# Patient Record
Sex: Male | Born: 1940 | Race: White | Hispanic: No | Marital: Married | Smoking: Former smoker
Health system: Southern US, Community
[De-identification: ages and names within clinical notes are randomized; demographics above are authoritative.]

## PROBLEM LIST (undated history)

## (undated) DIAGNOSIS — Z86718 Personal history of other venous thrombosis and embolism: Secondary | ICD-10-CM

## (undated) DIAGNOSIS — D6859 Other primary thrombophilia: Secondary | ICD-10-CM

## (undated) DIAGNOSIS — R351 Nocturia: Secondary | ICD-10-CM

## (undated) DIAGNOSIS — K5909 Other constipation: Secondary | ICD-10-CM

## (undated) DIAGNOSIS — Z86711 Personal history of pulmonary embolism: Secondary | ICD-10-CM

## (undated) DIAGNOSIS — Z8673 Personal history of transient ischemic attack (TIA), and cerebral infarction without residual deficits: Secondary | ICD-10-CM

## (undated) DIAGNOSIS — M1990 Unspecified osteoarthritis, unspecified site: Secondary | ICD-10-CM

## (undated) DIAGNOSIS — G629 Polyneuropathy, unspecified: Secondary | ICD-10-CM

## (undated) DIAGNOSIS — N401 Enlarged prostate with lower urinary tract symptoms: Secondary | ICD-10-CM

## (undated) DIAGNOSIS — I4510 Unspecified right bundle-branch block: Secondary | ICD-10-CM

## (undated) DIAGNOSIS — H9190 Unspecified hearing loss, unspecified ear: Secondary | ICD-10-CM

## (undated) DIAGNOSIS — Z973 Presence of spectacles and contact lenses: Secondary | ICD-10-CM

## (undated) DIAGNOSIS — S149XXA Injury of unspecified nerves of neck, initial encounter: Secondary | ICD-10-CM

## (undated) DIAGNOSIS — L219 Seborrheic dermatitis, unspecified: Secondary | ICD-10-CM

## (undated) DIAGNOSIS — Z8659 Personal history of other mental and behavioral disorders: Secondary | ICD-10-CM

## (undated) DIAGNOSIS — I10 Essential (primary) hypertension: Secondary | ICD-10-CM

## (undated) DIAGNOSIS — J449 Chronic obstructive pulmonary disease, unspecified: Secondary | ICD-10-CM

## (undated) DIAGNOSIS — E785 Hyperlipidemia, unspecified: Secondary | ICD-10-CM

## (undated) DIAGNOSIS — IMO0001 Reserved for inherently not codable concepts without codable children: Secondary | ICD-10-CM

## (undated) DIAGNOSIS — L309 Dermatitis, unspecified: Secondary | ICD-10-CM

## (undated) DIAGNOSIS — E119 Type 2 diabetes mellitus without complications: Secondary | ICD-10-CM

## (undated) DIAGNOSIS — Z85828 Personal history of other malignant neoplasm of skin: Secondary | ICD-10-CM

## (undated) DIAGNOSIS — I639 Cerebral infarction, unspecified: Secondary | ICD-10-CM

## (undated) DIAGNOSIS — G589 Mononeuropathy, unspecified: Secondary | ICD-10-CM

## (undated) DIAGNOSIS — R011 Cardiac murmur, unspecified: Secondary | ICD-10-CM

## (undated) DIAGNOSIS — Z86010 Personal history of colonic polyps: Secondary | ICD-10-CM

## (undated) DIAGNOSIS — C61 Malignant neoplasm of prostate: Secondary | ICD-10-CM

## (undated) DIAGNOSIS — Z860101 Personal history of adenomatous and serrated colon polyps: Secondary | ICD-10-CM

## (undated) DIAGNOSIS — Z7901 Long term (current) use of anticoagulants: Secondary | ICD-10-CM

## (undated) DIAGNOSIS — F32A Depression, unspecified: Secondary | ICD-10-CM

## (undated) DIAGNOSIS — R350 Frequency of micturition: Secondary | ICD-10-CM

## (undated) HISTORY — DX: Frequency of micturition: R35.0

## (undated) HISTORY — DX: Other primary thrombophilia: D68.59

## (undated) HISTORY — DX: Personal history of other venous thrombosis and embolism: Z86.718

## (undated) HISTORY — DX: Reserved for inherently not codable concepts without codable children: IMO0001

## (undated) HISTORY — PX: PROSTATE BIOPSY: SHX241

## (undated) HISTORY — PX: TOTAL HIP ARTHROPLASTY: SHX124

## (undated) HISTORY — DX: Cerebral infarction, unspecified: I63.9

## (undated) HISTORY — DX: Unspecified right bundle-branch block: I45.10

## (undated) HISTORY — PX: KNEE ARTHROSCOPY: SUR90

## (undated) HISTORY — DX: Seborrheic dermatitis, unspecified: L21.9

## (undated) HISTORY — DX: Long term (current) use of anticoagulants: Z79.01

## (undated) HISTORY — DX: Essential (primary) hypertension: I10

## (undated) HISTORY — PX: COLONOSCOPY: SHX174

## (undated) HISTORY — DX: Dermatitis, unspecified: L30.9

## (undated) HISTORY — PX: TONSILLECTOMY: SUR1361

---

## 1966-09-22 HISTORY — PX: PILONIDAL CYST EXCISION: SHX744

## 1979-09-23 HISTORY — PX: CHOLECYSTECTOMY OPEN: SUR202

## 2009-03-12 ENCOUNTER — Encounter: Admission: RE | Admit: 2009-03-12 | Discharge: 2009-03-12 | Payer: Self-pay | Admitting: *Deleted

## 2009-05-03 ENCOUNTER — Ambulatory Visit (HOSPITAL_COMMUNITY): Admission: RE | Admit: 2009-05-03 | Discharge: 2009-05-03 | Payer: Self-pay | Admitting: Internal Medicine

## 2009-05-04 ENCOUNTER — Encounter: Admission: RE | Admit: 2009-05-04 | Discharge: 2009-05-04 | Payer: Self-pay | Admitting: Internal Medicine

## 2009-06-18 ENCOUNTER — Inpatient Hospital Stay (HOSPITAL_COMMUNITY): Admission: RE | Admit: 2009-06-18 | Discharge: 2009-06-22 | Payer: Self-pay | Admitting: Orthopedic Surgery

## 2010-11-18 ENCOUNTER — Encounter: Payer: MEDICARE | Attending: Internal Medicine | Admitting: *Deleted

## 2010-11-18 DIAGNOSIS — Z713 Dietary counseling and surveillance: Secondary | ICD-10-CM | POA: Insufficient documentation

## 2010-11-18 DIAGNOSIS — Z724 Inappropriate diet and eating habits: Secondary | ICD-10-CM | POA: Insufficient documentation

## 2010-12-25 ENCOUNTER — Encounter: Payer: Medicare Other | Attending: Internal Medicine | Admitting: *Deleted

## 2010-12-25 DIAGNOSIS — E119 Type 2 diabetes mellitus without complications: Secondary | ICD-10-CM | POA: Insufficient documentation

## 2010-12-25 DIAGNOSIS — Z724 Inappropriate diet and eating habits: Secondary | ICD-10-CM | POA: Insufficient documentation

## 2010-12-25 DIAGNOSIS — Z713 Dietary counseling and surveillance: Secondary | ICD-10-CM | POA: Insufficient documentation

## 2010-12-26 LAB — PROTIME-INR: INR: 1.8 — ABNORMAL HIGH (ref 0.00–1.49)

## 2010-12-27 LAB — CBC
HCT: 31.5 % — ABNORMAL LOW (ref 39.0–52.0)
HCT: 43.8 % (ref 39.0–52.0)
MCHC: 34.5 g/dL (ref 30.0–36.0)
Platelets: 172 10*3/uL (ref 150–400)
Platelets: 188 10*3/uL (ref 150–400)
Platelets: 222 10*3/uL (ref 150–400)
RBC: 3.27 MIL/uL — ABNORMAL LOW (ref 4.22–5.81)
RBC: 4.88 MIL/uL (ref 4.22–5.81)
RDW: 13.7 % (ref 11.5–15.5)
RDW: 14.1 % (ref 11.5–15.5)
RDW: 14.2 % (ref 11.5–15.5)
WBC: 10.7 10*3/uL — ABNORMAL HIGH (ref 4.0–10.5)

## 2010-12-27 LAB — TYPE AND SCREEN: Antibody Screen: NEGATIVE

## 2010-12-27 LAB — PROTIME-INR
INR: 1 (ref 0.00–1.49)
INR: 1.3 (ref 0.00–1.49)
Prothrombin Time: 13.4 seconds (ref 11.6–15.2)
Prothrombin Time: 16.3 seconds — ABNORMAL HIGH (ref 11.6–15.2)

## 2010-12-27 LAB — APTT: aPTT: 27 seconds (ref 24–37)

## 2010-12-27 LAB — COMPREHENSIVE METABOLIC PANEL
AST: 23 U/L (ref 0–37)
Albumin: 3.9 g/dL (ref 3.5–5.2)
Alkaline Phosphatase: 62 U/L (ref 39–117)
CO2: 29 mEq/L (ref 19–32)
Chloride: 100 mEq/L (ref 96–112)
GFR calc Af Amer: >60 mL/min (ref >60)
Potassium: 4.2 mEq/L (ref 3.5–5.1)
Total Bilirubin: 0.7 mg/dL (ref 0.3–1.2)

## 2010-12-27 LAB — BASIC METABOLIC PANEL
BUN: 10 mg/dL (ref 6–23)
CO2: 29 mEq/L (ref 19–32)
Calcium: 8.1 mg/dL — ABNORMAL LOW (ref 8.4–10.5)
Creatinine, Ser: 0.86 mg/dL (ref 0.4–1.5)
GFR calc Af Amer: >60 mL/min (ref >60)
GFR calc non Af Amer: >60 mL/min (ref >60)
Glucose, Bld: 133 mg/dL — ABNORMAL HIGH (ref 70–99)
Glucose, Bld: 171 mg/dL — ABNORMAL HIGH (ref 70–99)
Potassium: 4.3 mEq/L (ref 3.5–5.1)

## 2010-12-27 LAB — URINALYSIS, ROUTINE W REFLEX MICROSCOPIC
Bilirubin Urine: NEGATIVE
Glucose, UA: NEGATIVE mg/dL
Hgb urine dipstick: NEGATIVE
Nitrite: NEGATIVE
Specific Gravity, Urine: 1.014 (ref 1.005–1.030)
pH: 7 (ref 5.0–8.0)

## 2010-12-29 LAB — BLOOD GAS, ARTERIAL
Bicarbonate: 24 mEq/L (ref 20.0–24.0)
FIO2: 0.21 %
Patient temperature: 98.6
pCO2 arterial: 36.9 mmHg (ref 35.0–45.0)
pH, Arterial: 7.429 (ref 7.350–7.450)

## 2011-04-22 ENCOUNTER — Encounter: Payer: Medicare Other | Attending: Internal Medicine | Admitting: *Deleted

## 2011-04-22 DIAGNOSIS — E119 Type 2 diabetes mellitus without complications: Secondary | ICD-10-CM | POA: Insufficient documentation

## 2011-04-22 DIAGNOSIS — Z713 Dietary counseling and surveillance: Secondary | ICD-10-CM | POA: Insufficient documentation

## 2011-04-22 DIAGNOSIS — Z724 Inappropriate diet and eating habits: Secondary | ICD-10-CM | POA: Insufficient documentation

## 2011-04-25 ENCOUNTER — Encounter: Payer: Self-pay | Admitting: *Deleted

## 2011-04-25 NOTE — Patient Instructions (Addendum)
Goals:  Follow Diabetes Meal Plan as instructed (yellow card).  Eat 3 meals and 2-3 snacks daily - Avoid meal skipping.  Limit carbohydrate intake to 45 grams per meal and 15 grams per snack.  Add lean protein foods to all meals and snacks.  Monitor glucose levels as instructed by your doctor.  Aim for 30 mins of physical activity daily.  Bring glucose log to your next nutrition visit.

## 2011-04-25 NOTE — Progress Notes (Signed)
  Medical Nutrition Therapy:  Appt start time: 1200 end time:  1300.  Assessment:  Primary concerns today: T2DM Follow up visit.  Pt down total of 27 lbs in 5 months (5-6 lbs/mo) and reports doing well.  Checks BG 1x/day at varying times. Recent:  FBG - 114, pre-prandial - 125, @hs  - 118 (mg/dL).  A1c has decreased from 7.3% (11/18/2010) to 6.7% (03/05/11).   Current outpatient prescriptions:  Zquil (OTC), Take 5 mLs by mouth 3 (three) times a week.   Cholecalciferol (VITAMIN D) 2000 UNITS CAPS, Take 1 capsule by mouth daily.  felodipine (PLENDIL) 10 MG 24 hr tablet, Take 10 mg by mouth daily.  lisinopril (PRINIVIL,ZESTRIL) 20 MG tablet, Take 20 mg by mouth daily.  metFORMIN (GLUCOPHAGE) 500 MG tablet, Take 500 mg by mouth 2 (two) times daily with a meal.  simvastatin (ZOCOR) 20 MG tablet, Take 20 mg by mouth at bedtime.  traMADol (ULTRAM) 50 MG tablet, Take 50 mg by mouth every 6 (six) hours as needed.  triamterene-hydrochlorothiazide (DYAZIDE) 37.5-25 MG per capsule, Take 1 capsule by mouth every morning. warfarin (COUMADIN) 10 MG tablet, Take 10 mg by mouth daily.  warfarin (COUMADIN) 5 MG tablet, Take 5 mg by mouth daily.   DIETARY INTAKE:  Reports 0 fried foods, baking all meat.  Decreased bread intake, eats whole wheat.    Recent physical activity: No exercise noted at this time.  Estimated energy needs: 1600-1700 calories 180-190 g carbohydrates  Progress Towards Goal(s):  Some progress.   Nutritional Diagnosis:  [place]-2.1 Inpaired nutrition utilization related to glucose and excessive CHO intake as evidenced by 24 hour recall and elevated A1c.    Intervention/Goals:  Follow Diabetes Meal Plan as instructed (yellow card).  Eat 3 meals and 2-3 snacks daily - Avoid meal skipping.  Limit carbohydrate intake to 45 grams per meal and 15 grams per snack.  Add lean protein foods to all meals and snacks.  Monitor glucose levels as instructed by your doctor.  Aim for 30 mins of  physical activity daily.  Bring glucose log to your next nutrition visit.  Monitoring/Evaluation:  Dietary intake, exercise, A1c, blood glucose, and body weight prn.

## 2012-12-10 ENCOUNTER — Other Ambulatory Visit: Payer: Self-pay | Admitting: Geriatric Medicine

## 2012-12-10 DIAGNOSIS — R413 Other amnesia: Secondary | ICD-10-CM

## 2012-12-10 DIAGNOSIS — I10 Essential (primary) hypertension: Secondary | ICD-10-CM

## 2012-12-10 DIAGNOSIS — E785 Hyperlipidemia, unspecified: Secondary | ICD-10-CM

## 2012-12-10 DIAGNOSIS — E119 Type 2 diabetes mellitus without complications: Secondary | ICD-10-CM

## 2012-12-10 DIAGNOSIS — J449 Chronic obstructive pulmonary disease, unspecified: Secondary | ICD-10-CM

## 2012-12-28 ENCOUNTER — Other Ambulatory Visit: Payer: Self-pay | Admitting: *Deleted

## 2012-12-31 ENCOUNTER — Encounter: Payer: Self-pay | Admitting: Geriatric Medicine

## 2013-01-03 ENCOUNTER — Encounter: Payer: Self-pay | Admitting: Pharmacotherapy

## 2013-01-03 ENCOUNTER — Ambulatory Visit (INDEPENDENT_AMBULATORY_CARE_PROVIDER_SITE_OTHER): Payer: Medicare Other | Admitting: Pharmacotherapy

## 2013-01-03 VITALS — BP 118/64 | HR 60 | Ht 66.0 in | Wt 234.0 lb

## 2013-01-03 DIAGNOSIS — D6859 Other primary thrombophilia: Secondary | ICD-10-CM

## 2013-01-03 DIAGNOSIS — Z7901 Long term (current) use of anticoagulants: Secondary | ICD-10-CM

## 2013-01-03 DIAGNOSIS — I2699 Other pulmonary embolism without acute cor pulmonale: Secondary | ICD-10-CM

## 2013-01-03 LAB — POCT INR: INR: 2

## 2013-01-03 NOTE — Progress Notes (Signed)
Subjective:     Patient ID: Barbaraann Barthel, male   DOB: 04-03-1941, 72 y.o.   MRN: [id number]  HPI Last INR was OK at 1.9.   Goal INR is 2-3 Current Coumadin dose is 15mg  QD except 12.5mg  on Sundays Denies missed doses. Has several scratches on arms from run in with [place]. Denies CP, SOB, falls Consistent with vitamin K foods.  Average BG:  107mg /dl No hypoglycemia   Review of Systems  HENT: Negative for nosebleeds.   Respiratory: Negative for chest tightness and shortness of breath.   Cardiovascular: Negative for chest pain and palpitations.  Gastrointestinal: Negative for blood in stool and anal bleeding.  Genitourinary: Negative for hematuria.       Objective:   Physical Exam  Constitutional: He is oriented to person, place, and time. He appears well-developed and well-nourished.  Eyes: Conjunctivae are normal. Pupils are equal, round, and reactive to light.  Neck: Normal range of motion.  Cardiovascular: Normal rate, regular rhythm and normal heart sounds.   Pulmonary/Chest: Effort normal and breath sounds normal.  Musculoskeletal: Normal range of motion.  Neurological: He is alert and oriented to person, place, and time.  Skin:  Scratches on arms       Assessment:     INR 2.0     Plan:     1.  Continue Coumadin 15mg  QD except 12.5mg  on Sundays .  INR at goal 2-3. 2.  Hypercoaguable state - stable 3.  PE - stable

## 2013-01-28 ENCOUNTER — Encounter: Payer: Self-pay | Admitting: *Deleted

## 2013-01-31 ENCOUNTER — Ambulatory Visit (INDEPENDENT_AMBULATORY_CARE_PROVIDER_SITE_OTHER): Payer: Medicare Other | Admitting: Pharmacotherapy

## 2013-01-31 ENCOUNTER — Encounter: Payer: Self-pay | Admitting: Pharmacotherapy

## 2013-01-31 ENCOUNTER — Other Ambulatory Visit: Payer: Self-pay | Admitting: *Deleted

## 2013-01-31 VITALS — BP 118/60 | HR 62 | Temp 96.0°F | Resp 20 | Ht 67.0 in | Wt 239.2 lb

## 2013-01-31 DIAGNOSIS — D6859 Other primary thrombophilia: Secondary | ICD-10-CM

## 2013-01-31 DIAGNOSIS — Z7901 Long term (current) use of anticoagulants: Secondary | ICD-10-CM

## 2013-01-31 DIAGNOSIS — I2699 Other pulmonary embolism without acute cor pulmonale: Secondary | ICD-10-CM

## 2013-01-31 MED ORDER — LISINOPRIL 20 MG PO TABS: ORAL_TABLET | ORAL | Status: DC

## 2013-01-31 MED ORDER — SIMVASTATIN 20 MG PO TABS: ORAL_TABLET | ORAL | Status: DC

## 2013-01-31 NOTE — Progress Notes (Signed)
Subjective:     Patient ID: Colton Miranda, male   DOB: Sep 01, 1941, 72 y.o.   MRN: [id number]  HPI Last INR was a little low, but we kept Coumadin at 15mg  daily except 12.5mg  on Sundays. Denies missed doses. No unusual bleeding or bruising. No CP, SOB, falls Consistent with vitamin K  Average BG:  111mg /dl No hypoglycemia  Review of Systems  HENT: Negative for nosebleeds.   Respiratory: Negative for chest tightness and shortness of breath.   Cardiovascular: Negative for chest pain.  Gastrointestinal: Negative for blood in stool and anal bleeding.  Genitourinary: Negative for hematuria.       Objective:   Physical Exam  Constitutional: He is oriented to person, place, and time. He appears well-developed and well-nourished.  Eyes: Conjunctivae are normal. Pupils are equal, round, and reactive to light.  Neck: Normal range of motion.  Cardiovascular: Normal rate and regular rhythm.   Pulmonary/Chest: Effort normal and breath sounds normal.  Neurological: He is alert and oriented to person, place, and time.  Skin: Skin is warm and dry.  Psychiatric: He has a normal mood and affect. His behavior is normal.    BP:  118/60    HR  62    Wt:  239lb   Temp:  96     Assessment:     INR 2.0     Plan:     1.  hypercoaguable state - no evidence of clotting 2.  INR at goal 2-3 3.  Continue Coumadin 15mg  QD except 12.5mg  Sundays

## 2013-01-31 NOTE — Patient Instructions (Addendum)
Continue Coumadin 15mg  daily except 12.5mg  on Sundays

## 2013-02-21 ENCOUNTER — Other Ambulatory Visit: Payer: Medicare Other

## 2013-02-21 DIAGNOSIS — E119 Type 2 diabetes mellitus without complications: Secondary | ICD-10-CM

## 2013-02-21 DIAGNOSIS — I10 Essential (primary) hypertension: Secondary | ICD-10-CM

## 2013-02-21 DIAGNOSIS — E785 Hyperlipidemia, unspecified: Secondary | ICD-10-CM

## 2013-02-22 LAB — BASIC METABOLIC PANEL
BUN/Creatinine Ratio: 18 (ref 10–22)
Creatinine, Ser: 0.98 mg/dL (ref 0.76–1.27)
GFR calc non Af Amer: 77 mL/min/{1.73_m2} (ref >59)
Sodium: 138 mmol/L (ref 134–144)

## 2013-02-22 LAB — LIPID PANEL
Chol/HDL Ratio: 2.3 ratio units (ref 0.0–5.0)
HDL: 47 mg/dL (ref >39)
LDL Calculated: 45 mg/dL (ref 0–99)
VLDL Cholesterol Cal: 14 mg/dL (ref 5–40)

## 2013-02-22 LAB — HEMOGLOBIN A1C: Hgb A1c MFr Bld: 6.4 % — ABNORMAL HIGH (ref 4.8–5.6)

## 2013-02-23 ENCOUNTER — Encounter: Payer: Self-pay | Admitting: Internal Medicine

## 2013-02-23 ENCOUNTER — Encounter: Payer: Self-pay | Admitting: Geriatric Medicine

## 2013-02-23 ENCOUNTER — Ambulatory Visit (INDEPENDENT_AMBULATORY_CARE_PROVIDER_SITE_OTHER): Payer: Medicare Other | Admitting: Internal Medicine

## 2013-02-23 VITALS — BP 116/70 | HR 58 | Temp 97.9°F | Resp 22 | Ht 67.0 in | Wt 235.0 lb

## 2013-02-23 DIAGNOSIS — E669 Obesity, unspecified: Secondary | ICD-10-CM

## 2013-02-23 DIAGNOSIS — G4700 Insomnia, unspecified: Secondary | ICD-10-CM

## 2013-02-23 DIAGNOSIS — D6859 Other primary thrombophilia: Secondary | ICD-10-CM

## 2013-02-23 DIAGNOSIS — E785 Hyperlipidemia, unspecified: Secondary | ICD-10-CM | POA: Insufficient documentation

## 2013-02-23 DIAGNOSIS — E1351 Other specified diabetes mellitus with diabetic peripheral angiopathy without gangrene: Secondary | ICD-10-CM | POA: Insufficient documentation

## 2013-02-23 DIAGNOSIS — E119 Type 2 diabetes mellitus without complications: Secondary | ICD-10-CM | POA: Insufficient documentation

## 2013-02-23 DIAGNOSIS — I2699 Other pulmonary embolism without acute cor pulmonale: Secondary | ICD-10-CM

## 2013-02-23 DIAGNOSIS — R350 Frequency of micturition: Secondary | ICD-10-CM

## 2013-02-23 DIAGNOSIS — H6123 Impacted cerumen, bilateral: Secondary | ICD-10-CM

## 2013-02-23 DIAGNOSIS — R209 Unspecified disturbances of skin sensation: Secondary | ICD-10-CM | POA: Insufficient documentation

## 2013-02-23 DIAGNOSIS — Z7901 Long term (current) use of anticoagulants: Secondary | ICD-10-CM

## 2013-02-23 DIAGNOSIS — H6120 Impacted cerumen, unspecified ear: Secondary | ICD-10-CM | POA: Insufficient documentation

## 2013-02-23 NOTE — Patient Instructions (Addendum)
Continue current medications. 

## 2013-02-23 NOTE — Progress Notes (Signed)
  Subjective:    Patient ID: Colton Miranda, male    DOB: Apr 15, 1941, 72 y.o.   MRN: [id number]  HPI Insomnia. Using OTC meds sometimes. Had sleep study by Dr. Jacinto Halim. Noted to have mild OSA. Did not need CPAP.  Tinnitus loud in the last few days.  Using a brace on the left knee.   Review of Systems  Constitutional: Negative for chills, activity change and appetite change.  HENT: Positive for hearing loss and tinnitus. Negative for ear pain and nosebleeds.   Eyes:       Corrective lenses  Respiratory: Negative for chest tightness and shortness of breath.   Cardiovascular: Positive for leg swelling. Negative for chest pain and palpitations.  Gastrointestinal: Negative for blood in stool and anal bleeding.  Endocrine:       Diabetic  Genitourinary: Negative for hematuria.  Musculoskeletal: Positive for back pain and arthralgias.  Skin: Negative.   Neurological: Negative for dizziness, tremors, seizures, syncope, facial asymmetry, speech difficulty, weakness, light-headedness, numbness and headaches.       Previous problems with paresthesias and discomfort in the left arm have not recurred in the last few months.  Hematological: Negative.        History of hypercoagulable state.  Psychiatric/Behavioral:       Problems with insomnia. Wakes at night. He is up at 4 AM.       Objective:   Physical Exam  Constitutional: He is oriented to person, place, and time.  Obese  HENT:  Bilateral cerumen impactions  Eyes:   Corrective lenses  Neck: Normal range of motion. Neck supple. No JVD present. No tracheal deviation present. No thyromegaly present.  Cardiovascular: Normal rate, regular rhythm and normal heart sounds.  Exam reveals no gallop and no friction rub.   No murmur heard. Pulmonary/Chest: Effort normal and breath sounds normal. No respiratory distress. He has no wheezes. He has no rales.  Abdominal: Soft. Bowel sounds are normal. He exhibits no distension and no mass. There  is no tenderness.  Musculoskeletal:  Back discomfort to palpation. 3+ bipedal edema. No calf tenderness. Full range of motion at shoulders, elbows, hips, and knees. Wobbly gait.  Lymphadenopathy:    He has no cervical adenopathy.  Neurological: He is alert and oriented to person, place, and time. No cranial nerve deficit.  Skin: Skin is warm and dry. No rash noted. No erythema. No pallor.  Psychiatric: He has a normal mood and affect. His behavior is normal. Thought content normal.          Assessment & Plan:  1. Obesity, unspecified Patient has not lost any weight. We encouraged him to continue to diet and try to lose more weight.  2. Type II or unspecified type diabetes mellitus without mention of complication, uncontrolled Excellent control. - Comprehensive metabolic panel; Future - Hemoglobin A1c; Future  3. Other and unspecified hyperlipidemia Controlled  4. Urinary frequency Unchanged  5. Impacted cerumen, bilateral Lavaged in the office today. Patient expressed an inability to hear clearer.  6. Primary hypercoagulable state Patient will be on lifelong anticoagulation related to this problem. Previous pulmonary embolus.  7. 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 No recent problems.  8. Long term (current) use of anticoagulants Lifelong anticoagulation due to hypercoagulable state and prior pulmonary embolus.  9. Disturbance of skin sensation Resolved  10. Insomnia, unspecified Patient says he does not want to take any additional medication at this time.

## 2013-03-03 ENCOUNTER — Other Ambulatory Visit: Payer: Self-pay | Admitting: *Deleted

## 2013-03-03 MED ORDER — TRAMADOL HCL 50 MG PO TABS: ORAL_TABLET | ORAL | Status: DC

## 2013-03-04 ENCOUNTER — Encounter: Payer: Self-pay | Admitting: *Deleted

## 2013-03-07 ENCOUNTER — Ambulatory Visit (INDEPENDENT_AMBULATORY_CARE_PROVIDER_SITE_OTHER): Payer: Medicare Other | Admitting: Pharmacotherapy

## 2013-03-07 ENCOUNTER — Encounter: Payer: Self-pay | Admitting: Pharmacotherapy

## 2013-03-07 VITALS — BP 148/86 | HR 52 | Resp 13 | Ht 67.0 in | Wt 234.0 lb

## 2013-03-07 DIAGNOSIS — D6859 Other primary thrombophilia: Secondary | ICD-10-CM

## 2013-03-07 DIAGNOSIS — Z7901 Long term (current) use of anticoagulants: Secondary | ICD-10-CM

## 2013-03-07 DIAGNOSIS — I2699 Other pulmonary embolism without acute cor pulmonale: Secondary | ICD-10-CM

## 2013-03-07 LAB — POCT INR: INR: 1.8

## 2013-03-07 NOTE — Patient Instructions (Signed)
Increase Coumadin 15mg  daily

## 2013-03-07 NOTE — Progress Notes (Signed)
Subjective:     Patient ID: Colton Miranda, male   DOB: [birth year], 72 y.o.   MRN: [id number]  HPI Last INR was OK at 2.0 Current coumadin dose is 15mg  daily except 12.5mg  Sundays Denies missed doses. Denies unusual bleeding or bruising. Denies CP, falls May be having more vitamin K foods - eating salads daily now.  Average BG:  105mg /dl No hypoglycemia.   Review of Systems  HENT: Negative for nosebleeds.   Respiratory: Negative for shortness of breath.   Cardiovascular: Negative for chest pain.  Gastrointestinal: Negative for blood in stool and anal bleeding.  Genitourinary: Negative for hematuria.       Objective:   Physical Exam  Constitutional: He is oriented to person, place, and time. He appears well-developed and well-nourished.  HENT:  Head: Normocephalic.  Eyes: Conjunctivae are normal. Pupils are equal, round, and reactive to light.  Musculoskeletal: Normal range of motion.  Neurological: He is alert and oriented to person, place, and time.  Skin: Skin is warm and dry.  Psychiatric: He has a normal mood and affect.    BP:  148/88    HR:  52    Wt:  234 lb       Assessment:     INR 1.8     Plan:     1.  INR below goal 2-3 2.  Increase Coumadin 15mg  daily 3.  RTC in 3 weeks.

## 2013-03-28 ENCOUNTER — Ambulatory Visit (INDEPENDENT_AMBULATORY_CARE_PROVIDER_SITE_OTHER): Payer: Medicare Other | Admitting: Pharmacotherapy

## 2013-03-28 ENCOUNTER — Encounter: Payer: Self-pay | Admitting: Pharmacotherapy

## 2013-03-28 VITALS — BP 122/70 | HR 57 | Temp 98.0°F | Resp 16 | Ht 67.0 in | Wt 234.6 lb

## 2013-03-28 DIAGNOSIS — D6859 Other primary thrombophilia: Secondary | ICD-10-CM

## 2013-03-28 DIAGNOSIS — Z7901 Long term (current) use of anticoagulants: Secondary | ICD-10-CM

## 2013-03-28 DIAGNOSIS — E119 Type 2 diabetes mellitus without complications: Secondary | ICD-10-CM

## 2013-03-28 DIAGNOSIS — I2699 Other pulmonary embolism without acute cor pulmonale: Secondary | ICD-10-CM

## 2013-03-28 MED ORDER — ONETOUCH ULTRA BLUE VI STRP: ORAL_STRIP | Status: DC

## 2013-03-28 MED ORDER — ONETOUCH DELICA LANCETS FINE MISC: 1.0000 | Status: DC

## 2013-03-28 NOTE — Progress Notes (Signed)
Subjective:     Patient ID: Colton Miranda, male   DOB: 12-06-1940, 72 y.o.   MRN: [id number]  HPI Last INR was low at 1.8 Coumadin was increased to to 15mg  daily Denies missed doses Denies unusual bleeding or bruising Denies CP, falls Consistent with vitamin K intake.  Average BG:  112mg /dl   Review of Systems  HENT: Negative for nosebleeds.   Respiratory: Negative for chest tightness and shortness of breath.   Cardiovascular: Negative for chest pain.  Gastrointestinal: Negative for blood in stool and anal bleeding.  Genitourinary: Negative for hematuria.       Objective:   Physical Exam  Constitutional: He is oriented to person, place, and time. He appears well-developed and well-nourished.  Eyes: Conjunctivae are normal. Pupils are equal, round, and reactive to light.  Pulmonary/Chest: Effort normal.  Musculoskeletal: Normal range of motion.  Neurological: He is alert and oriented to person, place, and time.  Skin: Skin is warm and dry.  Psychiatric: He has a normal mood and affect.   BP:  122/70  HR:  57  Wt:  234 lb     Assessment:     2.3     Plan:     1.  INR at goal 203 2.  Continue Coumadin 15mg  daily 3.  Return to clinic in 1 month

## 2013-03-28 NOTE — Patient Instructions (Signed)
Continue Coumadin 15mg daily 

## 2013-04-01 ENCOUNTER — Other Ambulatory Visit: Payer: Self-pay | Admitting: Geriatric Medicine

## 2013-04-01 DIAGNOSIS — E119 Type 2 diabetes mellitus without complications: Secondary | ICD-10-CM

## 2013-04-01 MED ORDER — ONETOUCH ULTRA BLUE VI STRP: ORAL_STRIP | Status: DC

## 2013-04-04 ENCOUNTER — Other Ambulatory Visit: Payer: Self-pay | Admitting: Geriatric Medicine

## 2013-04-04 DIAGNOSIS — E119 Type 2 diabetes mellitus without complications: Secondary | ICD-10-CM

## 2013-04-04 MED ORDER — ONETOUCH ULTRA BLUE VI STRP: ORAL_STRIP | Status: DC

## 2013-04-04 MED ORDER — ONETOUCH DELICA LANCETS FINE MISC: 1.0000 | Status: DC

## 2013-04-25 ENCOUNTER — Encounter: Payer: Self-pay | Admitting: Pharmacotherapy

## 2013-04-25 ENCOUNTER — Ambulatory Visit (INDEPENDENT_AMBULATORY_CARE_PROVIDER_SITE_OTHER): Payer: MEDICARE | Admitting: Pharmacotherapy

## 2013-04-25 VITALS — BP 124/72 | HR 52 | Temp 98.1°F | Resp 14 | Ht 67.0 in | Wt 237.2 lb

## 2013-04-25 DIAGNOSIS — I2699 Other pulmonary embolism without acute cor pulmonale: Secondary | ICD-10-CM

## 2013-04-25 DIAGNOSIS — D6859 Other primary thrombophilia: Secondary | ICD-10-CM

## 2013-04-25 DIAGNOSIS — Z7901 Long term (current) use of anticoagulants: Secondary | ICD-10-CM

## 2013-04-25 NOTE — Progress Notes (Signed)
Subjective:     Patient ID: Colton Miranda, male   DOB: 23-Oct-1940, 72 y.o.   MRN: [id number]  HPI Last INR was good. Current coumadin dose is 15 mg daily. 1 missed dose. Denies unusual bleeding or bruising Denies CP, falls Consistent with vitamin K intake.  Average BG:  109mg /dl No hypoglycemia   Review of Systems  HENT: Negative for nosebleeds.   Respiratory: Negative for shortness of breath.   Cardiovascular: Negative for chest pain and palpitations.  Gastrointestinal: Negative for blood in stool and anal bleeding.  Genitourinary: Negative for hematuria.       Objective:   Physical Exam  Constitutional: He is oriented to person, place, and time. He appears well-developed and well-nourished.  Eyes: Conjunctivae are normal. Pupils are equal, round, and reactive to light.  Neck: Normal range of motion.  Cardiovascular: Normal rate and regular rhythm.   Pulmonary/Chest: Effort normal and breath sounds normal.  Neurological: He is alert and oriented to person, place, and time.  Skin: Skin is warm and dry.  Psychiatric: He has a normal mood and affect.    BP 124/72    HR:  52   Wt:  237     Assessment:     INR 2.1     Plan:     1.  INR at goal 2-3 2.  Continue Coumadin 15mg  daily 3.  RTC 1 month

## 2013-04-25 NOTE — Patient Instructions (Signed)
Continue Coumadin 15mg  daily

## 2013-05-30 ENCOUNTER — Encounter: Payer: Self-pay | Admitting: Pharmacotherapy

## 2013-05-30 ENCOUNTER — Ambulatory Visit (INDEPENDENT_AMBULATORY_CARE_PROVIDER_SITE_OTHER): Payer: MEDICARE | Admitting: Pharmacotherapy

## 2013-05-30 VITALS — BP 124/70 | HR 61 | Wt 236.6 lb

## 2013-05-30 DIAGNOSIS — Z7901 Long term (current) use of anticoagulants: Secondary | ICD-10-CM

## 2013-05-30 DIAGNOSIS — D6859 Other primary thrombophilia: Secondary | ICD-10-CM

## 2013-05-30 DIAGNOSIS — I2699 Other pulmonary embolism without acute cor pulmonale: Secondary | ICD-10-CM

## 2013-05-30 NOTE — Progress Notes (Signed)
Subjective:     Patient ID: Barbaraann Barthel, male   DOB: 1941-04-04, 72 y.o.   MRN: [id number]  HPI Current Coumadin dose is 15mg  daily. Missed 1 dose. Denies unusual bleeding or bruising. Denies CP, fall. Consistent with vitamin K intake.  Average BG:  108mg /dl No hypoglycemia.   Review of Systems  HENT: Negative for nosebleeds.   Respiratory: Negative for shortness of breath.   Cardiovascular: Negative for chest pain.  Gastrointestinal: Negative for blood in stool and anal bleeding.  Genitourinary: Negative for hematuria.       Objective:   Physical Exam  Constitutional: He is oriented to person, place, and time. He appears well-developed and well-nourished.  Eyes: Conjunctivae are normal. Pupils are equal, round, and reactive to light.  Cardiovascular: Normal rate, regular rhythm and normal heart sounds.   Pulmonary/Chest: Effort normal.  Neurological: He is alert and oriented to person, place, and time.  Skin: Skin is warm and dry.  Psychiatric: He has a normal mood and affect.    BP:   124/70   HR:  61   Wt:  236     Assessment:     INR 2.2     Plan:     1.  Continue Coumadin 15mg  QD  2.  RTC in 1 month

## 2013-05-30 NOTE — Patient Instructions (Signed)
Continue Coumadin 15mg daily 

## 2013-06-20 ENCOUNTER — Other Ambulatory Visit: Payer: MEDICARE

## 2013-06-21 LAB — COMPREHENSIVE METABOLIC PANEL
ALT: 22 IU/L (ref 0–44)
AST: 16 IU/L (ref 0–40)
Albumin/Globulin Ratio: 2.2 (ref 1.1–2.5)
BUN/Creatinine Ratio: 18 (ref 10–22)
GFR calc Af Amer: 99 mL/min/{1.73_m2} (ref >59)
GFR calc non Af Amer: 86 mL/min/{1.73_m2} (ref >59)
Potassium: 4.5 mmol/L (ref 3.5–5.2)
Sodium: 140 mmol/L (ref 134–144)
Total Bilirubin: 0.5 mg/dL (ref 0.0–1.2)
Total Protein: 6.3 g/dL (ref 6.0–8.5)

## 2013-06-22 ENCOUNTER — Ambulatory Visit (INDEPENDENT_AMBULATORY_CARE_PROVIDER_SITE_OTHER): Payer: MEDICARE | Admitting: Internal Medicine

## 2013-06-22 ENCOUNTER — Encounter: Payer: Self-pay | Admitting: Internal Medicine

## 2013-06-22 VITALS — BP 118/60 | HR 50 | Temp 97.8°F | Wt 239.0 lb

## 2013-06-22 DIAGNOSIS — IMO0002 Reserved for concepts with insufficient information to code with codable children: Secondary | ICD-10-CM

## 2013-06-22 DIAGNOSIS — E785 Hyperlipidemia, unspecified: Secondary | ICD-10-CM

## 2013-06-22 DIAGNOSIS — D6859 Other primary thrombophilia: Secondary | ICD-10-CM

## 2013-06-22 DIAGNOSIS — E669 Obesity, unspecified: Secondary | ICD-10-CM

## 2013-06-22 DIAGNOSIS — Z7901 Long term (current) use of anticoagulants: Secondary | ICD-10-CM

## 2013-06-22 DIAGNOSIS — Z299 Encounter for prophylactic measures, unspecified: Secondary | ICD-10-CM

## 2013-06-22 MED ORDER — TETANUS-DIPHTHERIA TOXOIDS TD 2-2 LF/0.5ML IM SUSP: 0.5000 mL | Freq: Once | INTRAMUSCULAR | Status: DC

## 2013-06-22 NOTE — Patient Instructions (Signed)
Continue current medications. 

## 2013-06-22 NOTE — Progress Notes (Signed)
Subjective:    Patient ID: Colton Miranda, male    DOB: 02/24/1941, 72 y.o.   MRN: [id number]  HPI When walking he had a numb an slight tingling feeling down the left arm. Nearly hurting. Was red. He stopped walking. Pain goes away in about 5 minutes. No substernal discomfort, diaphoresis, headache, neck pain. Symptoms stop in the lower upper arm.  Primary hypercoagulable state: controlled on warfarin  Obesity, unspecified: 4# weight gain since June 2014  Other and unspecified hyperlipidemia: stable  Type II or unspecified type diabetes mellitus without mention of complication, uncontrolled: controlled  Long term (current) use of anticoagulants: life long because of hypercoagulable    Current Outpatient Prescriptions on File Prior to Visit  Medication Sig Dispense Refill  . Cholecalciferol (VITAMIN D) 2000 UNITS CAPS Take 1 capsule by mouth daily.        Marland Kitchen lisinopril (PRINIVIL,ZESTRIL) 20 MG tablet Take one tablet once a day for blood pressure  90 tablet  3  . metFORMIN (GLUCOPHAGE) 500 MG tablet Take 500 mg by mouth 2 (two) times daily with a meal. Take 1 tablet every morning to control blood sugar.      . ONE TOUCH ULTRA TEST test strip Use as instructed  100 each  12  . ONETOUCH DELICA LANCETS FINE MISC 1 each by Does not apply route as directed.  100 each  6  . simvastatin (ZOCOR) 20 MG tablet Take one tablet once a day for cholesterol  90 tablet  3  . traMADol (ULTRAM) 50 MG tablet Take 1 tablet twice daily as needed for pain.  180 tablet  5  . warfarin (COUMADIN) 10 MG tablet Take 10 mg by mouth. 15 mg daily: (1) 5mg  tab/ (1) 10mg  tab)      . warfarin (COUMADIN) 5 MG tablet Take 5 mg by mouth daily. 15 mg daily: (1) 5mg  tab/ (1) 10mg  tab)       No current facility-administered medications on file prior to visit.    Review of Systems  Constitutional: Negative for chills, activity change and appetite change.  HENT: Positive for hearing loss and tinnitus. Negative for ear  pain and nosebleeds.   Eyes:       Corrective lenses  Respiratory: Negative for chest tightness and shortness of breath.   Cardiovascular: Positive for leg swelling. Negative for chest pain and palpitations.  Gastrointestinal: Negative for blood in stool and anal bleeding.  Endocrine:       Diabetic  Genitourinary: Negative for hematuria.       Nocturia x 4. Some urgency. Normal stream.  Musculoskeletal: Positive for back pain and arthralgias.  Skin: Negative.   Neurological: Negative for dizziness, tremors, seizures, syncope, facial asymmetry, speech difficulty, weakness, light-headedness, numbness and headaches.       Previous problems with paresthesias and discomfort in the left arm have not recurred in the last few months.  Hematological: Negative.        History of hypercoagulable state.  Psychiatric/Behavioral:       Problems with insomnia. Wakes at night. He is up at 4 AM.       Objective:BP 118/60  Pulse 50  Temp(Src) 97.8 F (36.6 C) (Oral)  Wt 239 lb (108.41 kg)  BMI 37.42 kg/m2  SpO2 96%    Physical Exam  Constitutional: He is oriented to person, place, and time.  Obese  HENT:  Bilateral cerumen impactions  Eyes:   Corrective lenses  Neck: Normal range of motion. Neck supple. No  JVD present. No tracheal deviation present. No thyromegaly present.  Cardiovascular: Normal rate, regular rhythm and normal heart sounds.  Exam reveals no gallop and no friction rub.   No murmur heard. Pulmonary/Chest: Effort normal and breath sounds normal. No respiratory distress. He has no wheezes. He has no rales.  Abdominal: Soft. Bowel sounds are normal. He exhibits no distension and no mass. There is no tenderness.  Musculoskeletal:  Back discomfort to palpation. 3+ bipedal edema. No calf tenderness. Full range of motion at shoulders, elbows, hips, and knees. Wobbly gait. Using left knee brace with Velcro closures.  Lymphadenopathy:    He has no cervical adenopathy.   Neurological: He is alert and oriented to person, place, and time. No cranial nerve deficit.  Skin: Skin is warm and dry. No rash noted. No erythema. No pallor.  Psychiatric: He has a normal mood and affect. His behavior is normal. Thought content normal.     Appointment on 06/20/2013  Component Date Value Range Status  . Glucose 06/20/2013 101* 65 - 99 mg/dL Final  . BUN 16/06/9603 16  8 - 27 mg/dL Final  . Creatinine, Ser 06/20/2013 0.88  0.76 - 1.27 mg/dL Final  . GFR calc non Af Amer 06/20/2013 86  >59 mL/min/1.73 Final  . GFR calc Af Amer 06/20/2013 99  >59 mL/min/1.73 Final  . BUN/Creatinine Ratio 06/20/2013 18  10 - 22 Final  . Sodium 06/20/2013 140  134 - 144 mmol/L Final  . Potassium 06/20/2013 4.5  3.5 - 5.2 mmol/L Final  . Chloride 06/20/2013 101  97 - 108 mmol/L Final  . CO2 06/20/2013 25  18 - 29 mmol/L Final  . Calcium 06/20/2013 9.4  8.6 - 10.2 mg/dL Final  . Total Protein 06/20/2013 6.3  6.0 - 8.5 g/dL Final  . Albumin 54/05/8118 4.3  3.5 - 4.8 g/dL Final  . Globulin, Total 06/20/2013 2.0  1.5 - 4.5 g/dL Final  . Albumin/Globulin Ratio 06/20/2013 2.2  1.1 - 2.5 Final  . Total Bilirubin 06/20/2013 0.5  0.0 - 1.2 mg/dL Final  . Alkaline Phosphatase 06/20/2013 61  39 - 117 IU/L Final  . AST 06/20/2013 16  0 - 40 IU/L Final  . ALT 06/20/2013 22  0 - 44 IU/L Final  . Hemoglobin A1C 06/20/2013 6.3* 4.8 - 5.6 % Final   Comment:          Increased risk for diabetes: 5.7 - 6.4                                   Diabetes: >6.4                                   Glycemic control for adults with diabetes: <7.0  . Estimated average glucose 06/20/2013 134   Final  Office Visit on 05/30/2013  Component Date Value Range Status  . INR 05/30/2013 2.2   Final  Office Visit on 04/25/2013  Component Date Value Range Status  . INR 04/25/2013 2.1   Final  Office Visit on 03/28/2013  Component Date Value Range Status  . INR 03/28/2013 2.3   Final        Assessment & Plan:   Primary hypercoagulable state: controlled  Obesity, unspecified: resume weight loss efforts  Other and unspecified hyperlipidemia   - Plan: Lipid panel  Type II or unspecified  type diabetes mellitus without mention of complication, uncontrolled   - Plan: Hemoglobin A1c, Basic metabolic panel  Long term (current) use of anticoagulants: continue for life  Preventive medication therapy needed: needs Tdap. Given prescription

## 2013-06-27 ENCOUNTER — Ambulatory Visit (INDEPENDENT_AMBULATORY_CARE_PROVIDER_SITE_OTHER): Payer: MEDICARE | Admitting: Pharmacotherapy

## 2013-06-27 ENCOUNTER — Encounter: Payer: Self-pay | Admitting: Pharmacotherapy

## 2013-06-27 VITALS — BP 122/74 | HR 58 | Temp 97.8°F | Wt 237.8 lb

## 2013-06-27 DIAGNOSIS — Z7901 Long term (current) use of anticoagulants: Secondary | ICD-10-CM

## 2013-06-27 DIAGNOSIS — I2699 Other pulmonary embolism without acute cor pulmonale: Secondary | ICD-10-CM

## 2013-06-27 DIAGNOSIS — D6859 Other primary thrombophilia: Secondary | ICD-10-CM

## 2013-06-27 LAB — POCT INR: INR: 2.5

## 2013-06-27 NOTE — Progress Notes (Signed)
Subjective:     Patient ID: Colton Miranda, male   DOB: October 23, 1940, 72 y.o.   MRN: [id number]  HPI Last INR was on 05/30/13 was OK at 2.2 Current Coumadin dose is 15mg  daily. Denies missed doses. Denies unusual bleeding or bruising. Denies CP, SOB, falls Consistent with vitamin K intake  Average BG:  105mg /dl Denies hypoglycemia   Review of Systems  HENT: Negative for nosebleeds.   Respiratory: Negative for shortness of breath.   Cardiovascular: Negative for chest pain.  Gastrointestinal: Negative for blood in stool and anal bleeding.  Genitourinary: Negative for hematuria.       Objective:   Physical Exam  Constitutional: He is oriented to person, place, and time. He appears well-developed and well-nourished.  Eyes: Conjunctivae are normal. Pupils are equal, round, and reactive to light.  Cardiovascular: Normal rate, regular rhythm and normal heart sounds.   Pulmonary/Chest: Effort normal and breath sounds normal.  Musculoskeletal: Normal range of motion.  Neurological: He is alert and oriented to person, place, and time.  Skin: Skin is warm and dry.  Psychiatric: He has a normal mood and affect. His behavior is normal.   BP:  122/74    HR:  58   Wt:  237lb     Assessment:     INR 2.5     Plan:     1.  INR at goal 2-3 2.  Continue Coumadin 15mg  daily. 3.  RTC 4 weeks.

## 2013-06-27 NOTE — Patient Instructions (Signed)
Continue Coumadin 15mg  daily

## 2013-07-25 ENCOUNTER — Ambulatory Visit (INDEPENDENT_AMBULATORY_CARE_PROVIDER_SITE_OTHER): Payer: MEDICARE | Admitting: Pharmacotherapy

## 2013-07-25 ENCOUNTER — Encounter: Payer: Self-pay | Admitting: Pharmacotherapy

## 2013-07-25 VITALS — BP 136/70 | HR 53 | Wt 240.0 lb

## 2013-07-25 DIAGNOSIS — I2699 Other pulmonary embolism without acute cor pulmonale: Secondary | ICD-10-CM

## 2013-07-25 DIAGNOSIS — Z7901 Long term (current) use of anticoagulants: Secondary | ICD-10-CM

## 2013-07-25 DIAGNOSIS — D6859 Other primary thrombophilia: Secondary | ICD-10-CM

## 2013-07-25 LAB — POCT INR: INR: 2.2

## 2013-07-25 NOTE — Progress Notes (Signed)
Subjective:     Patient ID: Barbaraann Barthel, male   DOB: [birth year], 72 y.o.   MRN: [id number]  HPI Last INR was in target. Current Coumadin dose is 15mg  daily. Denies missed doses. Denies unusual bleeding or bruising. Denies CP, SOB, falls. Consistent with vitamin K intake.  Average BG:  112mg /dl No hypoglycemia.   Review of Systems  HENT: Negative for nosebleeds.   Respiratory: Negative for shortness of breath.   Cardiovascular: Negative for chest pain.  Gastrointestinal: Negative for blood in stool and anal bleeding.  Genitourinary: Negative for hematuria.       Objective:   Physical Exam  Constitutional: He is oriented to person, place, and time. He appears well-developed and well-nourished.  Eyes: Conjunctivae are normal. Pupils are equal, round, and reactive to light.  Cardiovascular: Normal rate, regular rhythm and normal heart sounds.   Pulmonary/Chest: Effort normal and breath sounds normal.  Neurological: He is alert and oriented to person, place, and time.  Skin: Skin is warm and dry.  Psychiatric: He has a normal mood and affect. His behavior is normal.   BP:  135/ 70   HR:  53  Wt:  240lb     Assessment:     INR 2.2     Plan:     1.  INR at goal 2-3. 2.  Continue Coumadin 15mg  daily 3.  RTC in 1 month

## 2013-07-25 NOTE — Patient Instructions (Signed)
Continue Coumadin 15mg  daily INR 2.2

## 2013-08-22 ENCOUNTER — Encounter: Payer: Self-pay | Admitting: Pharmacotherapy

## 2013-08-22 ENCOUNTER — Ambulatory Visit (INDEPENDENT_AMBULATORY_CARE_PROVIDER_SITE_OTHER): Payer: Medicare Other | Admitting: Pharmacotherapy

## 2013-08-22 VITALS — BP 118/64 | HR 62 | Temp 97.9°F | Resp 14 | Wt 242.2 lb

## 2013-08-22 DIAGNOSIS — Z7901 Long term (current) use of anticoagulants: Secondary | ICD-10-CM

## 2013-08-22 DIAGNOSIS — I2699 Other pulmonary embolism without acute cor pulmonale: Secondary | ICD-10-CM

## 2013-08-22 DIAGNOSIS — D6859 Other primary thrombophilia: Secondary | ICD-10-CM

## 2013-08-22 LAB — POCT INR: INR: 1.5

## 2013-08-22 NOTE — Progress Notes (Signed)
Subjective:     Patient ID: Colton Miranda, male   DOB: September 13, 1941, 72 y.o.   MRN: [id number]  HPI Last INR was in target 2-3. Current Coumadin 15mg  daily. He did miss one dose of Coumadin. Denies unusual bleeding or bruising. Denies CP, falls Consistent with vitamin K  Average BG:  98mg /dl No hypoglycemia   Review of Systems  HENT: Negative for nosebleeds.   Respiratory: Negative for shortness of breath.   Cardiovascular: Negative for chest pain.  Gastrointestinal: Negative for blood in stool and anal bleeding.  Genitourinary: Negative for hematuria.       Objective:   Physical Exam  Constitutional: He is oriented to person, place, and time. He appears well-developed and well-nourished.  Eyes: Pupils are equal, round, and reactive to light.  Cardiovascular: Normal rate, regular rhythm and normal heart sounds.   Pulmonary/Chest: Effort normal and breath sounds normal.  Musculoskeletal: Normal range of motion.  Neurological: He is alert and oriented to person, place, and time.  Skin: Skin is warm and dry.  Psychiatric: He has a normal mood and affect. His behavior is normal.    BP:  118/64   HR:  82   Wt:  242lb     Assessment:     INR 1.5     Plan:     1.  INR below goal 2-3 due to missed dose. 2.  Today take Coumadin 20mg   3.  Then resume Coumadin 15mg  daily 4.  RTC 6 weeks.

## 2013-08-22 NOTE — Patient Instructions (Signed)
INR 1.5 Today take Coumadin 20mg  Then resume Coumadin 15mg  daily

## 2013-09-05 ENCOUNTER — Other Ambulatory Visit: Payer: Self-pay | Admitting: *Deleted

## 2013-09-05 MED ORDER — WARFARIN SODIUM 10 MG PO TABS: ORAL_TABLET | ORAL | Status: DC

## 2013-10-03 ENCOUNTER — Ambulatory Visit (INDEPENDENT_AMBULATORY_CARE_PROVIDER_SITE_OTHER): Payer: Medicare Other | Admitting: Pharmacotherapy

## 2013-10-03 ENCOUNTER — Encounter: Payer: Self-pay | Admitting: Pharmacotherapy

## 2013-10-03 VITALS — BP 128/80 | HR 93 | Resp 10 | Wt 241.0 lb

## 2013-10-03 DIAGNOSIS — D6859 Other primary thrombophilia: Secondary | ICD-10-CM

## 2013-10-03 DIAGNOSIS — Z7901 Long term (current) use of anticoagulants: Secondary | ICD-10-CM

## 2013-10-03 DIAGNOSIS — I2699 Other pulmonary embolism without acute cor pulmonale: Secondary | ICD-10-CM

## 2013-10-03 LAB — POCT INR: INR: 2.5

## 2013-10-03 NOTE — Patient Instructions (Signed)
INR 2.5 Continue Coumadin 15mg  daily

## 2013-10-03 NOTE — Progress Notes (Signed)
Subjective:     Patient ID: Elizbeth Squires, male   DOB: 1941-06-15, 73 y.o.   MRN: [id number]  HPI Last INR was low at 1.5 due to missed dose. Current Coumadin dose is 15mg  daily. Denies missed doses. Denies unusual bleeding or bruising. Denies CP, SOB, falls Consistent with vitamin K intake.   Average BG:  113mg /dl No hypoglycemia  Review of Systems  HENT: Negative for nosebleeds.   Respiratory: Negative for shortness of breath.   Cardiovascular: Negative for chest pain.  Gastrointestinal: Negative for blood in stool and anal bleeding.  Genitourinary: Negative for hematuria.       Objective:   Physical Exam  Constitutional: He is oriented to person, place, and time. He appears well-developed and well-nourished.  HENT:  Right Ear: External ear normal.  Left Ear: External ear normal.  Eyes: Pupils are equal, round, and reactive to light.  Cardiovascular: Normal rate, regular rhythm and normal heart sounds.   Pulmonary/Chest: Effort normal and breath sounds normal.  Neurological: He is alert and oriented to person, place, and time.  Skin: Skin is warm and dry.  Psychiatric: He has a normal mood and affect. His behavior is normal.    BP:  128/80   HR:  93   Wt:  241lb     Assessment:     INR 2.5     Plan:     1.  INR at goal 2-3 2.  Continue Coumadin 15mg  daily 3.  Average BG is excellent without hypoglycemia. 4.  RTC in 6 weeks.

## 2013-10-25 ENCOUNTER — Other Ambulatory Visit: Payer: Medicare Other

## 2013-10-25 DIAGNOSIS — E785 Hyperlipidemia, unspecified: Secondary | ICD-10-CM

## 2013-10-25 DIAGNOSIS — E1165 Type 2 diabetes mellitus with hyperglycemia: Principal | ICD-10-CM

## 2013-10-25 DIAGNOSIS — IMO0001 Reserved for inherently not codable concepts without codable children: Secondary | ICD-10-CM

## 2013-10-26 ENCOUNTER — Ambulatory Visit (INDEPENDENT_AMBULATORY_CARE_PROVIDER_SITE_OTHER): Payer: Medicare Other | Admitting: Internal Medicine

## 2013-10-26 VITALS — BP 138/76 | HR 64 | Resp 10 | Wt 240.0 lb

## 2013-10-26 DIAGNOSIS — E1165 Type 2 diabetes mellitus with hyperglycemia: Principal | ICD-10-CM

## 2013-10-26 DIAGNOSIS — E669 Obesity, unspecified: Secondary | ICD-10-CM

## 2013-10-26 DIAGNOSIS — D6859 Other primary thrombophilia: Secondary | ICD-10-CM

## 2013-10-26 DIAGNOSIS — IMO0001 Reserved for inherently not codable concepts without codable children: Secondary | ICD-10-CM

## 2013-10-26 DIAGNOSIS — E785 Hyperlipidemia, unspecified: Secondary | ICD-10-CM

## 2013-10-26 LAB — BASIC METABOLIC PANEL
BUN / CREAT RATIO: 17 (ref 10–22)
BUN: 18 mg/dL (ref 8–27)
CALCIUM: 9.6 mg/dL (ref 8.6–10.2)
CO2: 25 mmol/L (ref 18–29)
CREATININE: 1.04 mg/dL (ref 0.76–1.27)
Chloride: 98 mmol/L (ref 97–108)
GFR calc Af Amer: 83 mL/min/{1.73_m2} (ref >59)
GFR, EST NON AFRICAN AMERICAN: 71 mL/min/{1.73_m2} (ref >59)
Glucose: 104 mg/dL — ABNORMAL HIGH (ref 65–99)
POTASSIUM: 4.7 mmol/L (ref 3.5–5.2)
SODIUM: 139 mmol/L (ref 134–144)

## 2013-10-26 LAB — LIPID PANEL
Chol/HDL Ratio: 2.7 ratio units (ref 0.0–5.0)
Cholesterol, Total: 124 mg/dL (ref 100–199)
HDL: 46 mg/dL (ref >39)
LDL Calculated: 57 mg/dL (ref 0–99)
Triglycerides: 106 mg/dL (ref 0–149)
VLDL Cholesterol Cal: 21 mg/dL (ref 5–40)

## 2013-10-26 MED ORDER — TRAMADOL HCL 50 MG PO TABS: ORAL_TABLET | ORAL | Status: DC

## 2013-10-26 NOTE — Patient Instructions (Signed)
Continue current medications. 

## 2013-10-26 NOTE — Progress Notes (Signed)
Patient ID: Colton Miranda, male   DOB: June 05, 1941, 73 y.o.   MRN: [id number]    Location:    [facility]  Place of Service:  OFFFICE    No Known Allergies  Chief Complaint  Patient presents with  . Medical Managment of Chronic Issues    4 month follow-up, discuss labs (copy given)  . Tinnitus    Ongoing concern of ringing in ears     HPI:  Type II or unspecified type diabetes mellitus without mention of complication, uncontrolled - much better control  Obesity, unspecified: No progressive weight loss. Noncompliant regarding diet.  Primary hypercoagulable state: Will need to remain anticoagulated  Other and unspecified hyperlipidemia: Controlled    Medications: Patient's Medications  New Prescriptions   No medications on file  Previous Medications   CHOLECALCIFEROL (VITAMIN D) 2000 UNITS CAPS    Take 1 capsule by mouth daily.     LISINOPRIL (PRINIVIL,ZESTRIL) 20 MG TABLET    Take one tablet once a day for blood pressure   METFORMIN (GLUCOPHAGE) 500 MG TABLET    Take 500 mg by mouth daily with breakfast. Take 1 tablet every morning to control blood sugar.   ONE TOUCH ULTRA TEST TEST STRIP    Use as instructed   ONETOUCH DELICA LANCETS FINE MISC    1 each by Does not apply route as directed.   SIMVASTATIN (ZOCOR) 20 MG TABLET    Take one tablet once a day for cholesterol   TRAMADOL (ULTRAM) 50 MG TABLET    Take 1 tablet twice daily as needed for pain.   WARFARIN (COUMADIN) 10 MG TABLET    Take one tablet by mouth once daily   WARFARIN (COUMADIN) 5 MG TABLET    Take 5 mg by mouth daily. 15 mg daily: (1) 5mg  tab/ (1) 10mg  tab)  Modified Medications   No medications on file  Discontinued Medications   No medications on file     Review of Systems  Constitutional: Negative for chills, activity change and appetite change.  HENT: Positive for hearing loss and tinnitus. Negative for ear pain and nosebleeds.   Eyes:       Corrective lenses  Respiratory: Negative for chest  tightness and shortness of breath.   Cardiovascular: Positive for leg swelling. Negative for chest pain and palpitations.  Gastrointestinal: Negative for blood in stool and anal bleeding.  Endocrine:       Diabetic  Genitourinary: Negative for hematuria.       Nocturia x 4. Some urgency. Normal stream.  Musculoskeletal: Positive for arthralgias and back pain.  Skin: Negative.   Neurological: Negative for dizziness, tremors, seizures, syncope, facial asymmetry, speech difficulty, weakness, light-headedness, numbness and headaches.       Previous problems with paresthesias and discomfort in the left arm have not recurred in the last few months.  Hematological: Negative.        History of hypercoagulable state.  Psychiatric/Behavioral:       Problems with insomnia. Wakes at night. He is up at 4 AM.    Filed Vitals:   10/26/13 1532  BP: 138/76  Pulse: 64  Resp: 10  Weight: 240 lb (108.863 kg)   Physical Exam  Constitutional: He is oriented to person, place, and time.  Obese  HENT:  Bilateral cerumen impactions  Eyes:   Corrective lenses  Neck: Normal range of motion. Neck supple. No JVD present. No tracheal deviation present. No thyromegaly present.  Cardiovascular: Normal rate, regular rhythm and normal  heart sounds.  Exam reveals no gallop and no friction rub.   No murmur heard. Pulmonary/Chest: Effort normal and breath sounds normal. No respiratory distress. He has no wheezes. He has no rales.  Abdominal: Soft. Bowel sounds are normal. He exhibits no distension and no mass. There is no tenderness.  Musculoskeletal:  Back discomfort to palpation. 3+ bipedal edema. No calf tenderness. Full range of motion at shoulders, elbows, hips, and knees. Wobbly gait. Using left knee brace with Velcro closures.  Lymphadenopathy:    He has no cervical adenopathy.  Neurological: He is alert and oriented to person, place, and time. No cranial nerve deficit.  Skin: Skin is warm and dry. No  rash noted. No erythema. No pallor.  Psychiatric: He has a normal mood and affect. His behavior is normal. Thought content normal.     Labs reviewed: Appointment on 10/25/2013  Component Date Value Range Status  . Glucose 10/25/2013 104* 65 - 99 mg/dL Final  . BUN 10/25/2013 18  8 - 27 mg/dL Final  . Creatinine, Ser 10/25/2013 1.04  0.76 - 1.27 mg/dL Final  . GFR calc non Af Amer 10/25/2013 71  >59 mL/min/1.73 Final  . GFR calc Af Amer 10/25/2013 83  >59 mL/min/1.73 Final  . BUN/Creatinine Ratio 10/25/2013 17  10 - 22 Final  . Sodium 10/25/2013 139  134 - 144 mmol/L Final  . Potassium 10/25/2013 4.7  3.5 - 5.2 mmol/L Final  . Chloride 10/25/2013 98  97 - 108 mmol/L Final  . CO2 10/25/2013 25  18 - 29 mmol/L Final  . Calcium 10/25/2013 9.6  8.6 - 10.2 mg/dL Final  . Cholesterol, Total 10/25/2013 124  100 - 199 mg/dL Final  . Triglycerides 10/25/2013 106  0 - 149 mg/dL Final  . HDL 10/25/2013 46  >39 mg/dL Final   Comment: According to ATP-III Guidelines, HDL-C >59 mg/dL is considered a                          negative risk factor for CHD.  Marland Kitchen VLDL Cholesterol Cal 10/25/2013 21  5 - 40 mg/dL Final  . LDL Calculated 10/25/2013 57  0 - 99 mg/dL Final  . Chol/HDL Ratio 10/25/2013 2.7  0.0 - 5.0 ratio units Final   Comment:                                   T. Chol/HDL Ratio                                                                      Men  Women                                                        1/2 Avg.Risk  3.4    3.3  Avg.Risk  5.0    4.4                                                         2X Avg.Risk  9.6    7.1                                                         3X Avg.Risk 23.4   11.0  Office Visit on 10/03/2013  Component Date Value Range Status  . INR 10/03/2013 2.5   Final  Office Visit on 08/22/2013  Component Date Value Range Status  . INR 08/22/2013 1.5   Final      Assessment/Plan  1.  Type II or unspecified type diabetes mellitus without mention of complication, uncontrolled - Hemoglobin A1c; Future - Comprehensive metabolic panel; Future - Lipid panel; Future - Microalbumin / creatinine urine ratio; Future  2. Obesity, unspecified I emphasized dietary compliance  3. Primary hypercoagulable state Lifelong anticoagulation  4. Other and unspecified hyperlipidemia Controlled

## 2013-11-14 ENCOUNTER — Ambulatory Visit (INDEPENDENT_AMBULATORY_CARE_PROVIDER_SITE_OTHER): Payer: Medicare Other | Admitting: Pharmacotherapy

## 2013-11-14 ENCOUNTER — Encounter: Payer: Self-pay | Admitting: Pharmacotherapy

## 2013-11-14 VITALS — BP 140/78 | HR 49 | Wt 243.0 lb

## 2013-11-14 DIAGNOSIS — I2699 Other pulmonary embolism without acute cor pulmonale: Secondary | ICD-10-CM

## 2013-11-14 DIAGNOSIS — Z7901 Long term (current) use of anticoagulants: Secondary | ICD-10-CM

## 2013-11-14 DIAGNOSIS — D6859 Other primary thrombophilia: Secondary | ICD-10-CM

## 2013-11-14 LAB — POCT INR: INR: 1.5

## 2013-11-14 NOTE — Progress Notes (Signed)
Subjective:     Patient ID: Colton Miranda, male   DOB: 02-12-1941, 73 y.o.   MRN: [id number]  HPI Last INR was OK at 2.5 Current Coumadin dose is 15mg  daily. He has missed at least one dose. No unusual bleeding or bruising. No CP, SOB, falls Consistent vitamin K intake.  Average BG:  105mg /dl No hypoglycemia   Review of Systems  HENT: Negative for nosebleeds.   Respiratory: Negative for shortness of breath.   Cardiovascular: Negative for chest pain.  Gastrointestinal: Negative for blood in stool and anal bleeding.  Genitourinary: Negative for hematuria.       Objective:   Physical Exam  Constitutional: He is oriented to person, place, and time. He appears well-developed and well-nourished.  HENT:  Right Ear: External ear normal.  Left Ear: External ear normal.  Eyes: Conjunctivae are normal. Pupils are equal, round, and reactive to light.  Cardiovascular: Normal rate, regular rhythm and normal heart sounds.   Pulmonary/Chest: Effort normal and breath sounds normal.  Neurological: He is alert and oriented to person, place, and time.  Skin: Skin is warm and dry.  Psychiatric: He has a normal mood and affect. His behavior is normal.       Assessment:     INR 1.5     Plan:     1.  INR below goal 2-3 due to missed dose. 2.  Take Coumadin 20mg  today as booster. 3.  Then resume Coumadin 15mg  daily

## 2013-11-14 NOTE — Patient Instructions (Signed)
Today take Coumadin 20mg , then resume 15mg  daily. INR 1.5

## 2013-12-26 ENCOUNTER — Encounter: Payer: Self-pay | Admitting: Pharmacotherapy

## 2013-12-26 ENCOUNTER — Other Ambulatory Visit: Payer: Self-pay | Admitting: *Deleted

## 2013-12-26 ENCOUNTER — Ambulatory Visit (INDEPENDENT_AMBULATORY_CARE_PROVIDER_SITE_OTHER): Payer: Medicare Other | Admitting: Pharmacotherapy

## 2013-12-26 VITALS — BP 116/68 | HR 58 | Wt 244.8 lb

## 2013-12-26 DIAGNOSIS — D6859 Other primary thrombophilia: Secondary | ICD-10-CM

## 2013-12-26 DIAGNOSIS — I2699 Other pulmonary embolism without acute cor pulmonale: Secondary | ICD-10-CM

## 2013-12-26 DIAGNOSIS — Z7901 Long term (current) use of anticoagulants: Secondary | ICD-10-CM

## 2013-12-26 DIAGNOSIS — E1165 Type 2 diabetes mellitus with hyperglycemia: Secondary | ICD-10-CM

## 2013-12-26 DIAGNOSIS — IMO0001 Reserved for inherently not codable concepts without codable children: Secondary | ICD-10-CM

## 2013-12-26 LAB — POCT INR: INR: 2.1

## 2013-12-26 MED ORDER — METFORMIN HCL 500 MG PO TABS: ORAL_TABLET | ORAL | Status: DC

## 2013-12-26 MED ORDER — WARFARIN SODIUM 10 MG PO TABS: ORAL_TABLET | ORAL | Status: DC

## 2013-12-26 NOTE — Patient Instructions (Signed)
INR 2.1  Continue Coumadin 15mg daily 

## 2013-12-26 NOTE — Progress Notes (Signed)
Subjective:     Patient ID: Colton Miranda, male   DOB: 07-Dec-1940, 73 y.o.   MRN: [id number]  HPI Last INR was low due to missed doses. Current Coumadin dose is 15mg  daily. Denies missed doses. Denies CP, palpitations, falls Denies unusual bleeding or bruising. Consistent with vitamin K intake.  Average BG:  115mg /dl Denies hypoglycemia  Review of Systems  HENT: Negative for nosebleeds.   Cardiovascular: Negative for chest pain and palpitations.  Gastrointestinal: Negative for blood in stool and anal bleeding.  Genitourinary: Negative for hematuria.       Objective:   Physical Exam  Constitutional: He is oriented to person, place, and time. He appears well-developed and well-nourished.  HENT:  Right Ear: External ear normal.  Left Ear: External ear normal.  Eyes: Pupils are equal, round, and reactive to light.  Cardiovascular: Normal rate, regular rhythm and normal heart sounds.   Pulmonary/Chest: Effort normal and breath sounds normal.  Neurological: He is alert and oriented to person, place, and time.  Skin: Skin is warm and dry.  Psychiatric: He has a normal mood and affect. His behavior is normal.    BP:  116/68  HR:  58  Wt:  244lb     Assessment:     INR 2.1     Plan:     1.  INR at goal 2-3 2.  Continue Coumadin 15mg  qD 3.  RTC in 6 weeks.

## 2014-01-05 ENCOUNTER — Other Ambulatory Visit: Payer: Self-pay | Admitting: *Deleted

## 2014-01-05 MED ORDER — WARFARIN SODIUM 5 MG PO TABS: 5.0000 mg | ORAL_TABLET | Freq: Every day | ORAL | Status: DC

## 2014-02-06 ENCOUNTER — Ambulatory Visit (INDEPENDENT_AMBULATORY_CARE_PROVIDER_SITE_OTHER): Payer: Medicare Other | Admitting: Pharmacotherapy

## 2014-02-06 ENCOUNTER — Encounter: Payer: Self-pay | Admitting: Pharmacotherapy

## 2014-02-06 VITALS — BP 122/68 | HR 59 | Temp 98.0°F | Wt 242.8 lb

## 2014-02-06 DIAGNOSIS — I2699 Other pulmonary embolism without acute cor pulmonale: Secondary | ICD-10-CM

## 2014-02-06 DIAGNOSIS — Z7901 Long term (current) use of anticoagulants: Secondary | ICD-10-CM

## 2014-02-06 DIAGNOSIS — D6859 Other primary thrombophilia: Secondary | ICD-10-CM

## 2014-02-06 LAB — POCT INR: INR: 2

## 2014-02-06 NOTE — Progress Notes (Signed)
Subjective:     Patient ID: Colton Miranda, male   DOB: 1941-03-27, 73 y.o.   MRN: [id number]  HPI Last INR was OK at 2.1 Current coumadin dose is 15mg  daily. He did miss 1 dose. Denies unusual bleeding or bruising. Denies CP, SOB, falls Consistent with vitamin K intake.  Average BG:   124mg /dl No hypoglycemia  Review of Systems  HENT: Positive for postnasal drip. Negative for nosebleeds.   Respiratory: Positive for cough. Negative for shortness of breath.   Cardiovascular: Negative for chest pain.  Gastrointestinal: Negative for blood in stool and anal bleeding.  Genitourinary: Negative for hematuria.       Objective:   Physical Exam  Constitutional: He appears well-developed and well-nourished.  HENT:  Right Ear: External ear normal.  Left Ear: External ear normal.  Eyes: Pupils are equal, round, and reactive to light.  Cardiovascular: Normal rate, regular rhythm and normal heart sounds.   Pulmonary/Chest: Effort normal and breath sounds normal.  Musculoskeletal:  Left knee in a brace  Neurological: He is alert.  Skin: Skin is warm and dry.  Psychiatric: He has a normal mood and affect. His behavior is normal.   BP:  122/68   HR:  59  Wt:  242lb     Assessment:     INR at goal 2-3  (INR 2.0)    Plan:     1.  Continue Coumadin 15mg   Daily 2.  RTC in 6 weeks

## 2014-02-06 NOTE — Patient Instructions (Signed)
INR 2.0 Continue Coumadin 15mg  daily

## 2014-02-20 ENCOUNTER — Other Ambulatory Visit: Payer: Medicare Other

## 2014-02-20 DIAGNOSIS — IMO0001 Reserved for inherently not codable concepts without codable children: Secondary | ICD-10-CM

## 2014-02-20 DIAGNOSIS — E1165 Type 2 diabetes mellitus with hyperglycemia: Principal | ICD-10-CM

## 2014-02-21 LAB — COMPREHENSIVE METABOLIC PANEL
A/G RATIO: 1.8 (ref 1.1–2.5)
ALT: 24 IU/L (ref 0–44)
AST: 23 IU/L (ref 0–40)
Albumin: 4.4 g/dL (ref 3.5–4.8)
Alkaline Phosphatase: 63 IU/L (ref 39–117)
BILIRUBIN TOTAL: 0.5 mg/dL (ref 0.0–1.2)
BUN/Creatinine Ratio: 16 (ref 10–22)
BUN: 19 mg/dL (ref 8–27)
CALCIUM: 9.3 mg/dL (ref 8.6–10.2)
CO2: 20 mmol/L (ref 18–29)
Chloride: 102 mmol/L (ref 97–108)
Creatinine, Ser: 1.22 mg/dL (ref 0.76–1.27)
GFR, EST AFRICAN AMERICAN: 68 mL/min/{1.73_m2} (ref >59)
GFR, EST NON AFRICAN AMERICAN: 59 mL/min/{1.73_m2} — AB (ref >59)
GLOBULIN, TOTAL: 2.4 g/dL (ref 1.5–4.5)
Glucose: 106 mg/dL — ABNORMAL HIGH (ref 65–99)
Potassium: 4.4 mmol/L (ref 3.5–5.2)
SODIUM: 138 mmol/L (ref 134–144)
Total Protein: 6.8 g/dL (ref 6.0–8.5)

## 2014-02-21 LAB — LIPID PANEL
CHOL/HDL RATIO: 2.3 ratio (ref 0.0–5.0)
Cholesterol, Total: 104 mg/dL (ref 100–199)
HDL: 45 mg/dL (ref >39)
LDL Calculated: 48 mg/dL (ref 0–99)
Triglycerides: 57 mg/dL (ref 0–149)
VLDL Cholesterol Cal: 11 mg/dL (ref 5–40)

## 2014-02-21 LAB — MICROALBUMIN / CREATININE URINE RATIO
CREATININE UR: 273.8 mg/dL (ref 22.0–328.0)
MICROALB/CREAT RATIO: 8.3 mg/g creat (ref 0.0–30.0)
Microalbumin, Urine: 22.8 ug/mL — ABNORMAL HIGH (ref 0.0–17.0)

## 2014-02-21 LAB — HEMOGLOBIN A1C
Est. average glucose Bld gHb Est-mCnc: 143 mg/dL
Hgb A1c MFr Bld: 6.6 % — ABNORMAL HIGH (ref 4.8–5.6)

## 2014-02-22 ENCOUNTER — Ambulatory Visit: Payer: Medicare Other | Admitting: Internal Medicine

## 2014-02-28 ENCOUNTER — Ambulatory Visit (INDEPENDENT_AMBULATORY_CARE_PROVIDER_SITE_OTHER): Payer: Medicare Other | Admitting: Internal Medicine

## 2014-02-28 ENCOUNTER — Encounter: Payer: Self-pay | Admitting: Internal Medicine

## 2014-02-28 VITALS — BP 118/60 | HR 54 | Temp 98.0°F | Wt 243.0 lb

## 2014-02-28 DIAGNOSIS — E785 Hyperlipidemia, unspecified: Secondary | ICD-10-CM

## 2014-02-28 DIAGNOSIS — E1165 Type 2 diabetes mellitus with hyperglycemia: Principal | ICD-10-CM

## 2014-02-28 DIAGNOSIS — E669 Obesity, unspecified: Secondary | ICD-10-CM

## 2014-02-28 DIAGNOSIS — IMO0001 Reserved for inherently not codable concepts without codable children: Secondary | ICD-10-CM

## 2014-02-28 DIAGNOSIS — D6859 Other primary thrombophilia: Secondary | ICD-10-CM

## 2014-02-28 DIAGNOSIS — M25569 Pain in unspecified knee: Secondary | ICD-10-CM

## 2014-02-28 DIAGNOSIS — Z7901 Long term (current) use of anticoagulants: Secondary | ICD-10-CM

## 2014-02-28 DIAGNOSIS — I10 Essential (primary) hypertension: Secondary | ICD-10-CM | POA: Insufficient documentation

## 2014-02-28 MED ORDER — SIMVASTATIN 20 MG PO TABS: ORAL_TABLET | ORAL | Status: DC

## 2014-02-28 MED ORDER — LISINOPRIL 20 MG PO TABS: ORAL_TABLET | ORAL | Status: DC

## 2014-02-28 NOTE — Progress Notes (Signed)
Patient ID: Colton Miranda, male   DOB: Feb 17, 1941, 73 y.o.   MRN: [id number]    Location:    [facility]  Place of Service:  OFFICE    No Known Allergies  Chief Complaint  Patient presents with  . Medical Management of Chronic Issues    4 month follow-up, discuss labs (copy printed)     HPI:  Type II or unspecified type diabetes mellitus without mention of complication, uncontrolled - Doing better. A1c now 6.6.  Pain in joint, lower leg: both knees. Wearing bsace on the right knee.  Primary hypercoagulable state: anticoagulated with warfarin  Long term (current) use of anticoagulants: warfarin for primary hypercoagulable state  Obesity, unspecified: no progress in weight loss. Poor dietary cccompliance  Other and unspecified hyperlipidemia -controlled  Unspecified essential hypertension - controlled    Medications: Patient's Medications  New Prescriptions   No medications on file  Previous Medications   CHOLECALCIFEROL (VITAMIN D) 2000 UNITS CAPS    Take 1 capsule by mouth daily.     LISINOPRIL (PRINIVIL,ZESTRIL) 20 MG TABLET    Take one tablet once a day for blood pressure   METFORMIN (GLUCOPHAGE) 500 MG TABLET    Take 1 tablet every morning to control blood sugar.   ONE TOUCH ULTRA TEST TEST STRIP    Use as instructed   ONETOUCH DELICA LANCETS FINE MISC    1 each by Does not apply route as directed.   SIMVASTATIN (ZOCOR) 20 MG TABLET    Take one tablet once a day for cholesterol   TRAMADOL (ULTRAM) 50 MG TABLET    Take 1 tablet twice daily as needed for pain.   WARFARIN (COUMADIN) 10 MG TABLET    Take one tablet by mouth once daily   WARFARIN (COUMADIN) 5 MG TABLET    Take 1 tablet (5 mg total) by mouth daily. 15 mg daily: (1) 5mg  tab/ (1) 10mg  tab)  Modified Medications   No medications on file  Discontinued Medications   No medications on file     Review of Systems  Constitutional: Negative for chills, activity change and appetite change.  HENT: Positive for  hearing loss and tinnitus. Negative for ear pain and nosebleeds.   Eyes:       Corrective lenses  Respiratory: Negative for chest tightness and shortness of breath.   Cardiovascular: Positive for leg swelling. Negative for chest pain and palpitations.  Gastrointestinal: Negative for blood in stool and anal bleeding.  Endocrine:       Diabetic  Genitourinary: Negative for hematuria.       Nocturia x 4. Some urgency. Normal stream.  Musculoskeletal: Positive for arthralgias and back pain.  Skin: Negative.   Neurological: Negative for dizziness, tremors, seizures, syncope, facial asymmetry, speech difficulty, weakness, light-headedness, numbness and headaches.       Previous problems with paresthesias and discomfort in the left arm have not recurred in the last few months.  Hematological: Negative.        History of hypercoagulable state.  Psychiatric/Behavioral:       Problems with insomnia. Wakes at night. He is up at 4 AM.    Filed Vitals:   02/28/14 1457  BP: 118/60  Pulse: 54  Temp: 98 F (36.7 C)  TempSrc: Oral  Weight: 243 lb (110.224 kg)  SpO2: 98%   Body mass index is 38.05 kg/(m^2).  Physical Exam  Constitutional: He is oriented to person, place, and time.  Obese  HENT:  History of cerumen  impactions  Eyes:   Corrective lenses  Neck: Normal range of motion. Neck supple. No JVD present. No tracheal deviation present. No thyromegaly present.  Cardiovascular: Normal rate, regular rhythm and normal heart sounds.  Exam reveals no gallop and no friction rub.   No murmur heard. Pulmonary/Chest: Effort normal and breath sounds normal. No respiratory distress. He has no wheezes. He has no rales.  Abdominal: Soft. Bowel sounds are normal. He exhibits no distension and no mass. There is no tenderness.  Musculoskeletal:  Back discomfort to palpation. 3+ bipedal edema. No calf tenderness. Full range of motion at shoulders, elbows, hips, and knees. Wobbly gait. Using left knee  brace with Velcro closures.  Lymphadenopathy:    He has no cervical adenopathy.  Neurological: He is alert and oriented to person, place, and time. No cranial nerve deficit.  Skin: Skin is warm and dry. No rash noted. No erythema. No pallor.  Psychiatric: He has a normal mood and affect. His behavior is normal. Thought content normal.     Labs reviewed: Appointment on 02/20/2014  Component Date Value Ref Range Status  . Hemoglobin A1C 02/20/2014 6.6* 4.8 - 5.6 % Final   Comment:          Increased risk for diabetes: 5.7 - 6.4                                   Diabetes: >6.4                                   Glycemic control for adults with diabetes: <7.0  . Estimated average glucose 02/20/2014 143   Final  . Glucose 02/20/2014 106* 65 - 99 mg/dL Final  . BUN 02/20/2014 19  8 - 27 mg/dL Final  . Creatinine, Ser 02/20/2014 1.22  0.76 - 1.27 mg/dL Final  . GFR calc non Af Amer 02/20/2014 59* >59 mL/min/1.73 Final  . GFR calc Af Amer 02/20/2014 68  >59 mL/min/1.73 Final  . BUN/Creatinine Ratio 02/20/2014 16  10 - 22 Final  . Sodium 02/20/2014 138  134 - 144 mmol/L Final  . Potassium 02/20/2014 4.4  3.5 - 5.2 mmol/L Final  . Chloride 02/20/2014 102  97 - 108 mmol/L Final  . CO2 02/20/2014 20  18 - 29 mmol/L Final  . Calcium 02/20/2014 9.3  8.6 - 10.2 mg/dL Final  . Total Protein 02/20/2014 6.8  6.0 - 8.5 g/dL Final  . Albumin 02/20/2014 4.4  3.5 - 4.8 g/dL Final  . Globulin, Total 02/20/2014 2.4  1.5 - 4.5 g/dL Final  . Albumin/Globulin Ratio 02/20/2014 1.8  1.1 - 2.5 Final  . Total Bilirubin 02/20/2014 0.5  0.0 - 1.2 mg/dL Final  . Alkaline Phosphatase 02/20/2014 63  39 - 117 IU/L Final  . AST 02/20/2014 23  0 - 40 IU/L Final  . ALT 02/20/2014 24  0 - 44 IU/L Final  . Cholesterol, Total 02/20/2014 104  100 - 199 mg/dL Final  . Triglycerides 02/20/2014 57  0 - 149 mg/dL Final  . HDL 02/20/2014 45  >39 mg/dL Final   Comment: According to ATP-III Guidelines, HDL-C >59 mg/dL is  considered a                          negative risk factor for CHD.  Marland Kitchen  VLDL Cholesterol Cal 02/20/2014 11  5 - 40 mg/dL Final  . LDL Calculated 02/20/2014 48  0 - 99 mg/dL Final  . Chol/HDL Ratio 02/20/2014 2.3  0.0 - 5.0 ratio units Final   Comment:                                   T. Chol/HDL Ratio                                                                      Men  Women                                                        1/2 Avg.Risk  3.4    3.3                                                            Avg.Risk  5.0    4.4                                                         2X Avg.Risk  9.6    7.1                                                         3X Avg.Risk 23.4   11.0  . Creatinine, Ur 02/20/2014 273.8  22.0 - 328.0 mg/dL Final  . Microalbum.,U,Random 02/20/2014 22.8* 0.0 - 17.0 ug/mL Final  . MICROALB/CREAT RATIO 02/20/2014 8.3  0.0 - 30.0 mg/g creat Final  Office Visit on 02/06/2014  Component Date Value Ref Range Status  . INR 02/06/2014 2.0   Final  Office Visit on 12/26/2013  Component Date Value Ref Range Status  . INR 12/26/2013 2.1   Final      Assessment/Plan  1. Pain in joint, lower leg Continue brace on the right leg. Continue tramadol as needed.  2. Type II or unspecified type diabetes mellitus without mention of complication, uncontrolled Improved - Hemoglobin A1c; Future - Comprehensive metabolic panel; Future  3. Primary hypercoagulable state Continue warfarin anticoagulation  4. Long term (current) use of anticoagulants Therapeutic  5. Obesity, unspecified Continue to encourage dietary compliance and weight loss  6. Other and unspecified hyperlipidemia - simvastatin (ZOCOR) 20 MG tablet; Take one tablet once a day for cholesterol  Dispense: 90 tablet; Refill: 3  7. Unspecified essential hypertension - lisinopril (PRINIVIL,ZESTRIL) 20 MG tablet; Take one tablet once a day for blood pressure  Dispense: 90 tablet; Refill: 3

## 2014-02-28 NOTE — Patient Instructions (Signed)
Use medications as listed 

## 2014-03-20 ENCOUNTER — Ambulatory Visit (INDEPENDENT_AMBULATORY_CARE_PROVIDER_SITE_OTHER): Payer: Medicare Other | Admitting: Pharmacotherapy

## 2014-03-20 ENCOUNTER — Encounter: Payer: Self-pay | Admitting: Pharmacotherapy

## 2014-03-20 VITALS — BP 136/80 | HR 57 | Temp 98.2°F | Resp 18 | Ht 67.0 in | Wt 241.8 lb

## 2014-03-20 DIAGNOSIS — D6859 Other primary thrombophilia: Secondary | ICD-10-CM

## 2014-03-20 DIAGNOSIS — Z7901 Long term (current) use of anticoagulants: Secondary | ICD-10-CM

## 2014-03-20 DIAGNOSIS — I2699 Other pulmonary embolism without acute cor pulmonale: Secondary | ICD-10-CM

## 2014-03-20 LAB — POCT INR: INR: 2.1

## 2014-03-20 NOTE — Patient Instructions (Signed)
INR 2.1 (goal 2-3) Continue Coumadin 15mg  daily

## 2014-03-20 NOTE — Progress Notes (Signed)
Subjective:     Patient ID: Colton Miranda, male   DOB: [birth year], 73 y.o.   MRN: [id number]  HPI Last INR was OK at 2.0 Current Coumadin dose is 15mg  daily. He is complaining of cold / cough.  Denies fever. May have missed a dose of Coumadin 2 weeks ago. Denies unusual bleeding or bruising. Denies CP, SOB, falls. Consistent with vitamin K intake  Average BG:  108mg /dl No hypoglycemia  Review of Systems  Constitutional: Negative for fever and chills.  HENT: Negative for nosebleeds and sore throat.   Respiratory: Positive for cough. Negative for shortness of breath.   Cardiovascular: Negative for chest pain and leg swelling.  Gastrointestinal: Negative for blood in stool and anal bleeding.  Genitourinary: Negative for hematuria.  Musculoskeletal: Positive for arthralgias.  Hematological: Does not bruise/bleed easily.       Objective:   Physical Exam  Constitutional: He is oriented to person, place, and time. He appears well-developed and well-nourished.  HENT:  Right Ear: External ear normal.  Left Ear: External ear normal.  Nose: Nose normal.  Cardiovascular: Normal rate, regular rhythm and normal heart sounds.   Pulmonary/Chest: Effort normal and breath sounds normal.  Neurological: He is alert and oriented to person, place, and time.  Skin: Skin is warm and dry.  Psychiatric: He has a normal mood and affect. His behavior is normal.    BP:  136/80   HR:  57   Wt:  241lb     Assessment:     INR 2.1     Plan:     1.  INR at goal 2-3 2.  Continue Coumadin 15mg  daily 3.  RTC in 6 weeks.

## 2014-05-01 ENCOUNTER — Ambulatory Visit: Payer: Medicare Other | Admitting: Pharmacotherapy

## 2014-05-08 ENCOUNTER — Encounter: Payer: Self-pay | Admitting: Pharmacotherapy

## 2014-05-08 ENCOUNTER — Ambulatory Visit (INDEPENDENT_AMBULATORY_CARE_PROVIDER_SITE_OTHER): Payer: Medicare Other | Admitting: Pharmacotherapy

## 2014-05-08 VITALS — BP 128/78 | HR 69 | Temp 98.0°F | Resp 20 | Ht 67.0 in | Wt 238.6 lb

## 2014-05-08 DIAGNOSIS — I2699 Other pulmonary embolism without acute cor pulmonale: Secondary | ICD-10-CM

## 2014-05-08 DIAGNOSIS — Z7901 Long term (current) use of anticoagulants: Secondary | ICD-10-CM

## 2014-05-08 DIAGNOSIS — D6859 Other primary thrombophilia: Secondary | ICD-10-CM

## 2014-05-08 LAB — POCT INR: INR: 1.7

## 2014-05-08 MED ORDER — WARFARIN SODIUM 5 MG PO TABS
ORAL_TABLET | ORAL | Status: DC
Start: 2014-05-08 — End: 2015-01-22

## 2014-05-08 NOTE — Progress Notes (Signed)
Subjective:     Patient ID: Colton Miranda, male   DOB: Feb 04, 1941, 73 y.o.   MRN: [id number]  HPI Last INR was OK at 2.1 Current coumadin dose is 15mg  daily. Denies unusual bleeding or bruising. Did miss 1 dose of Coumadin last week. He twisted his left knee last week.  Painful.  Now wearing a knee brace.  Saw ortho (Dr. Maureen Ralphs) last week - drained 90cc of fluid from knee. Denies CP, SOB, falls. Consistent with vitamin K intake.  Average BG:  110mg /dl No hypoglycemia   Review of Systems  HENT: Negative for nosebleeds.   Respiratory: Negative for shortness of breath.   Cardiovascular: Positive for leg swelling. Negative for chest pain.  Gastrointestinal: Negative for blood in stool and anal bleeding.  Genitourinary: Negative for hematuria.  Musculoskeletal: Positive for joint swelling.  Hematological: Does not bruise/bleed easily.       Objective:   Physical Exam  Vitals reviewed. Constitutional: He is oriented to person, place, and time. He appears well-developed and well-nourished.  HENT:  Right Ear: External ear normal.  Left Ear: External ear normal.  Eyes: Pupils are equal, round, and reactive to light.  Wears corrective lenses  Cardiovascular: Normal rate, regular rhythm and normal heart sounds.   Pulmonary/Chest: Effort normal and breath sounds normal.  Musculoskeletal: He exhibits edema and tenderness.  Neurological: He is alert and oriented to person, place, and time.  Skin: Skin is warm and dry.  Psychiatric: He has a normal mood and affect. His behavior is normal.    BP:  128/78  HR:  69  Wt:  238lb     Assessment:     INR 1.7     Plan:     INR below goal 2-3 Increase Coumadin to 15mg  daily except 17.5mg  on Mondays RTC in 4 weeks

## 2014-05-08 NOTE — Patient Instructions (Signed)
INR 1.7 Increase Coumadin 15mg  daily except 17.5mg  on Mondays

## 2014-05-25 ENCOUNTER — Other Ambulatory Visit: Payer: Self-pay | Admitting: *Deleted

## 2014-05-25 MED ORDER — TRAMADOL HCL 50 MG PO TABS: ORAL_TABLET | ORAL | Status: DC

## 2014-05-25 NOTE — Telephone Encounter (Signed)
[facility]

## 2014-06-05 ENCOUNTER — Ambulatory Visit (INDEPENDENT_AMBULATORY_CARE_PROVIDER_SITE_OTHER): Payer: Medicare Other | Admitting: Pharmacotherapy

## 2014-06-05 ENCOUNTER — Encounter: Payer: Self-pay | Admitting: Pharmacotherapy

## 2014-06-05 VITALS — BP 120/68 | HR 52 | Temp 97.8°F | Wt 238.0 lb

## 2014-06-05 DIAGNOSIS — D6859 Other primary thrombophilia: Secondary | ICD-10-CM

## 2014-06-05 DIAGNOSIS — I2699 Other pulmonary embolism without acute cor pulmonale: Secondary | ICD-10-CM

## 2014-06-05 DIAGNOSIS — Z7901 Long term (current) use of anticoagulants: Secondary | ICD-10-CM

## 2014-06-05 LAB — POCT INR: INR: 2.6

## 2014-06-05 NOTE — Patient Instructions (Signed)
INR 2.6  Continue Coumadin 15mg  daily except 17.5mg  on Mondays

## 2014-06-05 NOTE — Progress Notes (Signed)
   Subjective:    Patient ID: Colton Miranda, male    DOB: 1941/06/17, 73 y.o.   MRN: [id number]  HPI Last INR was low at 1.7. Coumadin was increased to 15mg  daily except 17.5mg  on Mondays. Denies missed doses. Denies unusual bleeding or bruising. Denies CP, SOB, falls Consistent with vitamin K intake    Review of Systems  HENT: Negative for nosebleeds.   Respiratory: Negative for shortness of breath.   Cardiovascular: Negative for chest pain.  Gastrointestinal: Negative for blood in stool and anal bleeding.  Genitourinary: Negative for hematuria.  Hematological: Does not bruise/bleed easily.       Objective:   Physical Exam  Vitals reviewed. Constitutional: He is oriented to person, place, and time. He appears well-developed and well-nourished.  HENT:  Right Ear: External ear normal.  Left Ear: External ear normal.  Cardiovascular: Normal rate and normal heart sounds.   Pulmonary/Chest: Effort normal and breath sounds normal.  Neurological: He is alert and oriented to person, place, and time.  Skin: Skin is warm and dry.  Psychiatric: He has a normal mood and affect. His behavior is normal.   BP:  120/68  HR:  52  Wt:  238lb  INR 2.6       Assessment & Plan:  1.  INR at goal 2-3. 2.  Continue Coumadin 15mg  QD except 17.5mg  Mondays 3.  RTC 6 weeks

## 2014-06-30 ENCOUNTER — Other Ambulatory Visit: Payer: Medicare Other

## 2014-06-30 DIAGNOSIS — IMO0002 Reserved for concepts with insufficient information to code with codable children: Secondary | ICD-10-CM

## 2014-06-30 DIAGNOSIS — E1165 Type 2 diabetes mellitus with hyperglycemia: Secondary | ICD-10-CM

## 2014-07-01 LAB — HEMOGLOBIN A1C
Est. average glucose Bld gHb Est-mCnc: 148 mg/dL
Hgb A1c MFr Bld: 6.8 % — ABNORMAL HIGH (ref 4.8–5.6)

## 2014-07-01 LAB — COMPREHENSIVE METABOLIC PANEL
A/G RATIO: 1.6 (ref 1.1–2.5)
ALT: 22 IU/L (ref 0–44)
AST: 21 IU/L (ref 0–40)
Albumin: 4.2 g/dL (ref 3.5–4.8)
Alkaline Phosphatase: 57 IU/L (ref 39–117)
BILIRUBIN TOTAL: 0.4 mg/dL (ref 0.0–1.2)
BUN / CREAT RATIO: 15 (ref 10–22)
BUN: 17 mg/dL (ref 8–27)
CALCIUM: 9.5 mg/dL (ref 8.6–10.2)
CO2: 23 mmol/L (ref 18–29)
Chloride: 102 mmol/L (ref 97–108)
Creatinine, Ser: 1.13 mg/dL (ref 0.76–1.27)
GFR calc Af Amer: 74 mL/min/{1.73_m2} (ref >59)
GFR, EST NON AFRICAN AMERICAN: 64 mL/min/{1.73_m2} (ref >59)
GLOBULIN, TOTAL: 2.7 g/dL (ref 1.5–4.5)
Glucose: 97 mg/dL (ref 65–99)
Potassium: 4.4 mmol/L (ref 3.5–5.2)
SODIUM: 139 mmol/L (ref 134–144)
Total Protein: 6.9 g/dL (ref 6.0–8.5)

## 2014-07-04 ENCOUNTER — Encounter: Payer: Self-pay | Admitting: Internal Medicine

## 2014-07-04 ENCOUNTER — Ambulatory Visit (INDEPENDENT_AMBULATORY_CARE_PROVIDER_SITE_OTHER): Payer: Medicare Other | Admitting: Internal Medicine

## 2014-07-04 VITALS — BP 132/82 | HR 61 | Temp 98.0°F | Resp 18 | Ht 67.0 in | Wt 242.8 lb

## 2014-07-04 DIAGNOSIS — E119 Type 2 diabetes mellitus without complications: Secondary | ICD-10-CM

## 2014-07-04 DIAGNOSIS — I10 Essential (primary) hypertension: Secondary | ICD-10-CM

## 2014-07-04 DIAGNOSIS — E118 Type 2 diabetes mellitus with unspecified complications: Secondary | ICD-10-CM

## 2014-07-04 DIAGNOSIS — E785 Hyperlipidemia, unspecified: Secondary | ICD-10-CM

## 2014-07-04 MED ORDER — ONETOUCH ULTRA BLUE VI STRP: ORAL_STRIP | Status: DC

## 2014-07-04 MED ORDER — ONETOUCH DELICA LANCETS FINE MISC: 1.0000 | Status: DC

## 2014-07-04 MED ORDER — ZOSTER VACCINE LIVE 19400 UNT/0.65ML ~~LOC~~ SOLR: 0.6500 mL | Freq: Once | SUBCUTANEOUS | Status: DC

## 2014-07-04 NOTE — Progress Notes (Signed)
Patient ID: Colton Miranda, male   DOB: [birth year], 73 y.o.   MRN: [id number]    Facility  [facility]    Place of Service:   OFFICE   No Known Allergies  Chief Complaint  Patient presents with  . Medical Management of Chronic Issues    HPI:   Essential hypertension: controlled  Hyperlipemia -normal 02/20/14  Diabetes mellitus type 2, controlled: continues to see clinical pharmacologist    Medications: Patient's Medications  New Prescriptions   No medications on file  Previous Medications   CHOLECALCIFEROL (VITAMIN D) 2000 UNITS CAPS    Take 1 capsule by mouth daily.     LISINOPRIL (PRINIVIL,ZESTRIL) 20 MG TABLET    Take one tablet once a day for blood pressure   METFORMIN (GLUCOPHAGE) 500 MG TABLET    Take 1 tablet every morning to control blood sugar.   ONE TOUCH ULTRA TEST TEST STRIP    Use as instructed   ONETOUCH DELICA LANCETS FINE MISC    1 each by Does not apply route as directed.   SIMVASTATIN (ZOCOR) 20 MG TABLET    Take one tablet once a day for cholesterol   TRAMADOL (ULTRAM) 50 MG TABLET    Take 1 tablet twice daily as needed for pain.   WARFARIN (COUMADIN) 10 MG TABLET    Take one tablet by mouth once daily   WARFARIN (COUMADIN) 5 MG TABLET    15 mg daily except 17.5mg  on Mondays: (1) 5mg  tab, 1 & 1/2 on Mondays/ (1) 10mg  tab)  Modified Medications   No medications on file  Discontinued Medications   No medications on file     Review of Systems  Constitutional: Negative for chills, activity change and appetite change.  HENT: Positive for hearing loss and tinnitus. Negative for ear pain and nosebleeds.   Eyes:       Corrective lenses  Respiratory: Negative for chest tightness and shortness of breath.   Cardiovascular: Positive for leg swelling. Negative for chest pain and palpitations.  Gastrointestinal: Negative for blood in stool and anal bleeding.  Endocrine:       Diabetic  Genitourinary: Negative for hematuria.       Nocturia x 4. Some urgency.  Normal stream.  Musculoskeletal: Positive for arthralgias and back pain.  Skin: Negative.   Neurological: Negative for dizziness, tremors, seizures, syncope, facial asymmetry, speech difficulty, weakness, light-headedness, numbness and headaches.       Previous problems with paresthesias and discomfort in the left arm have not recurred in the last few months.  Hematological: Negative.        History of hypercoagulable state.  Psychiatric/Behavioral:       Problems with insomnia. Wakes at night. He is up at 4 AM.    Filed Vitals:   07/04/14 1556  BP: 132/82  Pulse: 61  Temp: 98 F (36.7 C)  TempSrc: Oral  Resp: 18  Height: 5\' 7"  (1.702 m)  Weight: 242 lb 12.8 oz (110.133 kg)  SpO2: 98%   Body mass index is 38.02 kg/(m^2).  Physical Exam  Constitutional: He is oriented to person, place, and time.  Obese  HENT:  History of cerumen impactions  Eyes:   Corrective lenses  Neck: Normal range of motion. Neck supple. No JVD present. No tracheal deviation present. No thyromegaly present.  Cardiovascular: Normal rate, regular rhythm and normal heart sounds.  Exam reveals no gallop and no friction rub.   No murmur heard. Pulmonary/Chest: Effort normal and breath sounds  normal. No respiratory distress. He has no wheezes. He has no rales.  Abdominal: Soft. Bowel sounds are normal. He exhibits no distension and no mass. There is no tenderness.  Musculoskeletal:  Back discomfort to palpation. 3+ bipedal edema. No calf tenderness. Full range of motion at shoulders, elbows, hips, and knees. Wobbly gait. Using left knee brace with Velcro closures.  Lymphadenopathy:    He has no cervical adenopathy.  Neurological: He is alert and oriented to person, place, and time. No cranial nerve deficit.  Skin: Skin is warm and dry. No rash noted. No erythema. No pallor.  Psychiatric: He has a normal mood and affect. His behavior is normal. Thought content normal.     Labs reviewed: Appointment on  06/30/2014  Component Date Value Ref Range Status  . Hemoglobin A1C 06/30/2014 6.8* 4.8 - 5.6 % Final   Comment:          Increased risk for diabetes: 5.7 - 6.4                                   Diabetes: >6.4                                   Glycemic control for adults with diabetes: <7.0  . Estimated average glucose 06/30/2014 148   Final  . Glucose 06/30/2014 97  65 - 99 mg/dL Final  . BUN 06/30/2014 17  8 - 27 mg/dL Final  . Creatinine, Ser 06/30/2014 1.13  0.76 - 1.27 mg/dL Final  . GFR calc non Af Amer 06/30/2014 64  >59 mL/min/1.73 Final  . GFR calc Af Amer 06/30/2014 74  >59 mL/min/1.73 Final  . BUN/Creatinine Ratio 06/30/2014 15  10 - 22 Final  . Sodium 06/30/2014 139  134 - 144 mmol/L Final  . Potassium 06/30/2014 4.4  3.5 - 5.2 mmol/L Final  . Chloride 06/30/2014 102  97 - 108 mmol/L Final  . CO2 06/30/2014 23  18 - 29 mmol/L Final  . Calcium 06/30/2014 9.5  8.6 - 10.2 mg/dL Final  . Total Protein 06/30/2014 6.9  6.0 - 8.5 g/dL Final  . Albumin 06/30/2014 4.2  3.5 - 4.8 g/dL Final  . Globulin, Total 06/30/2014 2.7  1.5 - 4.5 g/dL Final  . Albumin/Globulin Ratio 06/30/2014 1.6  1.1 - 2.5 Final  . Total Bilirubin 06/30/2014 0.4  0.0 - 1.2 mg/dL Final  . Alkaline Phosphatase 06/30/2014 57  39 - 117 IU/L Final  . AST 06/30/2014 21  0 - 40 IU/L Final  . ALT 06/30/2014 22  0 - 44 IU/L Final  Office Visit on 06/05/2014  Component Date Value Ref Range Status  . INR 06/05/2014 2.6   Final  Office Visit on 05/08/2014  Component Date Value Ref Range Status  . INR 05/08/2014 1.7   Final     Assessment/Plan    Type 2 diabetes mellitus with complication : controlled  Plan: ONE TOUCH ULTRA TEST test strip, ONETOUCH DELICA LANCETS FINE MISC, Hemoglobin T9Q, Basic metabolic panel, Microalbumin, urine  Essential hypertension: controlled  Hyperlipemia  - Plan: Lipid panel

## 2014-07-13 ENCOUNTER — Other Ambulatory Visit: Payer: Self-pay | Admitting: *Deleted

## 2014-07-13 DIAGNOSIS — E118 Type 2 diabetes mellitus with unspecified complications: Secondary | ICD-10-CM

## 2014-07-13 MED ORDER — ONETOUCH ULTRA BLUE VI STRP: ORAL_STRIP | Status: DC

## 2014-07-13 NOTE — Telephone Encounter (Signed)
[facility]

## 2014-07-17 ENCOUNTER — Ambulatory Visit (INDEPENDENT_AMBULATORY_CARE_PROVIDER_SITE_OTHER): Payer: Medicare Other | Admitting: Pharmacotherapy

## 2014-07-17 ENCOUNTER — Encounter: Payer: Self-pay | Admitting: Pharmacotherapy

## 2014-07-17 VITALS — BP 130/69 | HR 53 | Temp 97.0°F | Ht 67.0 in | Wt 247.8 lb

## 2014-07-17 DIAGNOSIS — D6852 Prothrombin gene mutation: Secondary | ICD-10-CM

## 2014-07-17 DIAGNOSIS — D6859 Other primary thrombophilia: Secondary | ICD-10-CM

## 2014-07-17 LAB — POCT INR: INR: 2.2

## 2014-07-17 NOTE — Patient Instructions (Signed)
INR 2.2 Continue same dose of Coumadin (warfarin) - 15mg  daily except 17.5mg  Mondays

## 2014-07-17 NOTE — Progress Notes (Signed)
   Subjective:    Patient ID: Colton Miranda, male    DOB: [birth year], 73 y.o.   MRN: [id number]  HPI Last INR was OK at 2.8. Current Coumadin dose is 15mg  QD except 17.5mg  on Mondays. Denies missed doses. Denies unusual bleeding or bruising. Denies CP, falls. Consistent with vitamin K intake.  He reports BG values are good. Forgot to bring blood glucose meter.   Review of Systems  HENT: Negative for nosebleeds.   Respiratory: Negative for shortness of breath.   Cardiovascular: Negative for chest pain.  Gastrointestinal: Negative for blood in stool and anal bleeding.  Genitourinary: Negative for hematuria.  Hematological: Does not bruise/bleed easily.       Objective:   Physical Exam  Vitals reviewed. Constitutional: He is oriented to person, place, and time. He appears well-developed and well-nourished.  HENT:  Right Ear: External ear normal.  Left Ear: External ear normal.  Cardiovascular: Normal rate, regular rhythm and normal heart sounds.   Pulmonary/Chest: Effort normal and breath sounds normal.  Neurological: He is alert and oriented to person, place, and time.  Skin: Skin is warm and dry.  Psychiatric: He has a normal mood and affect. His behavior is normal.    BP:  130/69,  HR:  58  Wt:  247lb  INR 2.2      Assessment & Plan:  1.  INR at goal 2-3. 2.  Continue Coumadin 15mg  QD except 17.5mg  Mondays 3.  RTC in 6 weeks.

## 2014-07-18 ENCOUNTER — Other Ambulatory Visit: Payer: Self-pay | Admitting: *Deleted

## 2014-07-18 DIAGNOSIS — E118 Type 2 diabetes mellitus with unspecified complications: Secondary | ICD-10-CM

## 2014-07-18 MED ORDER — ONETOUCH ULTRA BLUE VI STRP: ORAL_STRIP | Status: DC

## 2014-07-18 MED ORDER — ONETOUCH DELICA LANCETS FINE MISC: Status: DC

## 2014-07-18 NOTE — Telephone Encounter (Signed)
Tried calling wife back to let her know i called the pharmacy and that i am waiting for the forms to be faxed to me--No answer just rings.

## 2014-07-18 NOTE — Telephone Encounter (Signed)
Patient wife called and stated that patient has not received lancets or strips and pharmacy faxed a form to [name] to fill out. I have not received this form so I called pharmacy and asked them to refax it.

## 2014-07-19 ENCOUNTER — Telehealth: Payer: Self-pay

## 2014-07-19 NOTE — Telephone Encounter (Signed)
2 messages left on voicemail from [place]   1.) Call to discuss reference number for strips and lancets 2.) Call to verify fax received on patient  1 message left on voicemail from patients wife 1.) " Please call me to discuss screw up at [facility]."  I called [facility], spoke with pharmacist. Pharmacist stated we can disregard calls, patient took prescriptions back. I called patient, left message on voicemail for patient's wife to return call

## 2014-07-19 NOTE — Telephone Encounter (Signed)
Forms filled out and faxed back to the pharmacy [facility] Fax # [phone] for One Touch Ultrasoft Lancets and One Touch Ultra Test Strips. Patient wife Notified.

## 2014-07-19 NOTE — Telephone Encounter (Signed)
Patient's wife placed a call to insurance company and [employer]. Mrs.Gentry will take rx to another pharmacy and contact us back if any additional assistance is needed

## 2014-07-21 ENCOUNTER — Other Ambulatory Visit: Payer: Self-pay | Admitting: *Deleted

## 2014-07-21 DIAGNOSIS — E118 Type 2 diabetes mellitus with unspecified complications: Secondary | ICD-10-CM

## 2014-07-21 MED ORDER — ONETOUCH DELICA LANCETS FINE MISC: Status: DC

## 2014-07-21 MED ORDER — ONETOUCH ULTRA BLUE VI STRP: ORAL_STRIP | Status: DC

## 2014-07-21 NOTE — Telephone Encounter (Signed)
[facility] 

## 2014-08-28 ENCOUNTER — Ambulatory Visit (INDEPENDENT_AMBULATORY_CARE_PROVIDER_SITE_OTHER): Payer: Medicare Other | Admitting: Pharmacotherapy

## 2014-08-28 ENCOUNTER — Encounter: Payer: Self-pay | Admitting: Pharmacotherapy

## 2014-08-28 VITALS — BP 122/72 | HR 55 | Temp 98.1°F | Resp 20 | Ht 67.0 in | Wt 248.0 lb

## 2014-08-28 DIAGNOSIS — D6852 Prothrombin gene mutation: Secondary | ICD-10-CM

## 2014-08-28 DIAGNOSIS — D6859 Other primary thrombophilia: Secondary | ICD-10-CM

## 2014-08-28 LAB — POCT INR: INR: 2.7

## 2014-08-28 NOTE — Patient Instructions (Signed)
INR 2.7 Continue Coumadin 15mg  daily except 17.5mg  on Mondays

## 2014-08-28 NOTE — Progress Notes (Signed)
   Subjective:    Patient ID: Colton Miranda, male    DOB: [birth year], 73 y.o.   MRN: [id number]  HPI Last INR on 07/17/14 was OK at 2.2 Current coumadin 15mg  QD except 17.5mg  Mondays Denies missed doses  Denies unusual bleeding or bruising Denies CP, SOB, falls Consistent with vitamin K intake  Average BG:  119mg /dl No hypoglycemia   Review of Systems  HENT: Negative for nosebleeds.   Respiratory: Negative for shortness of breath.   Cardiovascular: Negative for chest pain.  Gastrointestinal: Negative for blood in stool and anal bleeding.  Genitourinary: Negative for hematuria.  Hematological: Does not bruise/bleed easily.       Objective:   Physical Exam  Constitutional: He is oriented to person, place, and time. He appears well-developed and well-nourished.  HENT:  Right Ear: External ear normal.  Left Ear: External ear normal.  Cardiovascular: Normal rate, regular rhythm and normal heart sounds.   Pulmonary/Chest: Effort normal and breath sounds normal.  Neurological: He is alert and oriented to person, place, and time.  Skin: Skin is warm and dry.  Psychiatric: He has a normal mood and affect. His behavior is normal. Judgment and thought content normal.  Vitals reviewed.  BP:  122/72  HR:  55  Wt:  248lb  INR:  2.7        Assessment & Plan:  1.  INR at goal 2-3 2.  Continue Coumadin 15mg  QD except 17.5mg  Mondays 3.  RTC in 6 weeks

## 2014-10-09 ENCOUNTER — Encounter: Payer: Self-pay | Admitting: Pharmacotherapy

## 2014-10-09 ENCOUNTER — Ambulatory Visit (INDEPENDENT_AMBULATORY_CARE_PROVIDER_SITE_OTHER): Payer: PPO | Admitting: Pharmacotherapy

## 2014-10-09 VITALS — BP 138/82 | HR 63 | Temp 97.5°F | Ht 67.0 in | Wt 245.0 lb

## 2014-10-09 DIAGNOSIS — D6852 Prothrombin gene mutation: Secondary | ICD-10-CM

## 2014-10-09 DIAGNOSIS — D6859 Other primary thrombophilia: Secondary | ICD-10-CM

## 2014-10-09 LAB — POCT INR: INR: 2.8

## 2014-10-09 NOTE — Progress Notes (Signed)
   Subjective:    Patient ID: Colton Miranda, male    DOB: 06-23-1941, 74 y.o.   MRN: [id number]  HPI Last INR was OK at 2.7. Current Coumadin dose is 15mg  QD except 17.5mg  on Mondays. Denies missed doses. Denies CP, palpitations, falls Denies unusual bleeding or bruising. Consistent with vitamin K.  Average BG:  116mg /dl   Review of Systems  HENT: Negative for nosebleeds.   Respiratory: Negative for shortness of breath.   Cardiovascular: Negative for chest pain.  Gastrointestinal: Negative for blood in stool and anal bleeding.  Genitourinary: Negative for hematuria.  Hematological: Does not bruise/bleed easily.       Objective:   Physical Exam  Constitutional: He is oriented to person, place, and time. He appears well-developed and well-nourished.  HENT:  Right Ear: External ear normal.  Left Ear: External ear normal.  Cardiovascular: Normal rate, regular rhythm and normal heart sounds.   Pulmonary/Chest: Effort normal and breath sounds normal.  Neurological: He is alert and oriented to person, place, and time.  Skin: Skin is warm and dry.  Psychiatric: He has a normal mood and affect. His behavior is normal. Judgment and thought content normal.  Vitals reviewed.   BP:  138/82   HR:  60  Wt:  245lb  INR 2.8        Assessment & Plan:  1.  INR at goal 2-3 2.  Continue Coumadin 15mg  daily except 17.5mg  on Mondays. 3.  RTC in 6 weeks.

## 2014-10-09 NOTE — Patient Instructions (Signed)
INR 2.8 Continue Coumadin 15mg  daily except 17.5mg  on Mondays

## 2014-11-08 ENCOUNTER — Encounter: Payer: Self-pay | Admitting: Internal Medicine

## 2014-11-08 ENCOUNTER — Ambulatory Visit (INDEPENDENT_AMBULATORY_CARE_PROVIDER_SITE_OTHER): Payer: PPO | Admitting: Internal Medicine

## 2014-11-08 VITALS — BP 124/72 | HR 51 | Temp 97.6°F | Ht 67.0 in | Wt 248.6 lb

## 2014-11-08 DIAGNOSIS — D6852 Prothrombin gene mutation: Secondary | ICD-10-CM

## 2014-11-08 DIAGNOSIS — M79602 Pain in left arm: Secondary | ICD-10-CM

## 2014-11-08 DIAGNOSIS — I10 Essential (primary) hypertension: Secondary | ICD-10-CM

## 2014-11-08 DIAGNOSIS — E118 Type 2 diabetes mellitus with unspecified complications: Secondary | ICD-10-CM

## 2014-11-08 DIAGNOSIS — E119 Type 2 diabetes mellitus without complications: Secondary | ICD-10-CM

## 2014-11-08 DIAGNOSIS — R002 Palpitations: Secondary | ICD-10-CM

## 2014-11-08 DIAGNOSIS — Z23 Encounter for immunization: Secondary | ICD-10-CM

## 2014-11-08 DIAGNOSIS — E669 Obesity, unspecified: Secondary | ICD-10-CM

## 2014-11-08 DIAGNOSIS — Z7901 Long term (current) use of anticoagulants: Secondary | ICD-10-CM

## 2014-11-08 DIAGNOSIS — E785 Hyperlipidemia, unspecified: Secondary | ICD-10-CM

## 2014-11-08 DIAGNOSIS — D6859 Other primary thrombophilia: Secondary | ICD-10-CM

## 2014-11-08 MED ORDER — TRAMADOL HCL 50 MG PO TABS: ORAL_TABLET | ORAL | Status: DC

## 2014-11-08 NOTE — Progress Notes (Signed)
Patient ID: Colton Miranda, male   DOB: [birth year], 74 y.o.   MRN: [id number]    Facility  [facility]    Place of Service:   OFFICE   No Known Allergies  Chief Complaint  Patient presents with  . Medical Management of Chronic Issues    4 Month Follow up    HPI:  Essential hypertension: controlled  Type 2 diabetes mellitus without complication: no recent lab  Hyperlipemia: no recent lab  Obesity: no weight loss  Primary hypercoagulable state: must remain on anticoagulation  Long term current use of anticoagulant therapy  Palpitations: occasional and not associated with dizziness, chest pain, or dyspnea.  Pain of left arm: occurs when up walking. No radiation of the pain. No dyspnea or diaphoresis. History of frozen left shoulder. Still has trouble with abduction and rotation. Says he does not think it is bad enough to do additional studies at this time.    Medications: Patient's Medications  New Prescriptions   No medications on file  Previous Medications   CHOLECALCIFEROL (VITAMIN D) 2000 UNITS CAPS    Take 1 capsule by mouth daily.     LISINOPRIL (PRINIVIL,ZESTRIL) 20 MG TABLET    Take one tablet once a day for blood pressure   METFORMIN (GLUCOPHAGE) 500 MG TABLET    Take 1 tablet every morning to control blood sugar.   ONE TOUCH ULTRA TEST TEST STRIP    Use to check blood sugar three times daily. Not injecting insulin. Dx E11.65, Y78   ONETOUCH DELICA LANCETS FINE MISC    Use to check blood sugar three times daily. Not injecting insulin. Dx E11.65, I10   SIMVASTATIN (ZOCOR) 20 MG TABLET    Take one tablet once a day for cholesterol   WARFARIN (COUMADIN) 10 MG TABLET    Take one tablet by mouth once daily   WARFARIN (COUMADIN) 5 MG TABLET    15 mg daily except 17.5mg  on Mondays: (1) 5mg  tab, 1 & 1/2 on Mondays/ (1) 10mg  tab)   ZOSTER VACCINE LIVE, PF, (ZOSTAVAX) 29562 UNT/0.65ML INJECTION    Inject 19,400 Units into the skin once.  Modified Medications   Modified  Medication Previous Medication   TRAMADOL (ULTRAM) 50 MG TABLET traMADol (ULTRAM) 50 MG tablet      Take 1 tablet twice daily as needed for pain.    Take 1 tablet twice daily as needed for pain.  Discontinued Medications   No medications on file     Review of Systems  Constitutional: Negative for chills, activity change and appetite change.  HENT: Positive for hearing loss and tinnitus. Negative for ear pain and nosebleeds.   Eyes:       Corrective lenses  Respiratory: Negative for chest tightness and shortness of breath.   Cardiovascular: Positive for leg swelling. Negative for chest pain and palpitations.  Gastrointestinal: Negative for blood in stool and anal bleeding.  Endocrine:       Diabetic  Genitourinary: Negative for hematuria.       Nocturia x 4. Some urgency. Normal stream.  Musculoskeletal: Positive for back pain and arthralgias (shoulder pains).  Skin: Negative.   Neurological: Negative for dizziness, tremors, seizures, syncope, facial asymmetry, speech difficulty, weakness, light-headedness, numbness and headaches.       Previous problems with paresthesias and discomfort in the left arm have not recurred in the last few months.  Hematological: Negative.        History of hypercoagulable state.  Psychiatric/Behavioral:  Problems with insomnia. Wakes at night. He is up at 4 AM.    Filed Vitals:   11/08/14 1440  BP: 124/72  Pulse: 51  Temp: 97.6 F (36.4 C)  TempSrc: Oral  Height: 5\' 7"  (1.702 m)  Weight: 248 lb 9.6 oz (112.764 kg)   Body mass index is 38.93 kg/(m^2).  Physical Exam  Constitutional: He is oriented to person, place, and time.  Obese  HENT:  History of cerumen impactions  Eyes:   Corrective lenses  Neck: Normal range of motion. Neck supple. No JVD present. No tracheal deviation present. No thyromegaly present.  Cardiovascular: Normal rate, regular rhythm and normal heart sounds.  Exam reveals no gallop and no friction rub.   No murmur  heard. Pulmonary/Chest: Effort normal and breath sounds normal. No respiratory distress. He has no wheezes. He has no rales.  Abdominal: Soft. Bowel sounds are normal. He exhibits no distension and no mass. There is no tenderness.  Musculoskeletal:  Back discomfort to palpation. 3+ bipedal edema. No calf tenderness. Reduced ability to raise left arm at the shoulder. Wobbly gait. Using left knee brace with Velcro closures.  Lymphadenopathy:    He has no cervical adenopathy.  Neurological: He is alert and oriented to person, place, and time. No cranial nerve deficit.  Skin: Skin is warm and dry. No rash noted. No erythema. No pallor.  Psychiatric: He has a normal mood and affect. His behavior is normal. Thought content normal.     Labs reviewed: Office Visit on 10/09/2014  Component Date Value Ref Range Status  . INR 10/09/2014 2.8   Final  Office Visit on 08/28/2014  Component Date Value Ref Range Status  . INR 08/28/2014 2.7   Final     Assessment/Plan  1. Essential hypertension controlled  2. Type 2 diabetes mellitus without complication -CMP  3. Hyperlipemia -lipids - Lipid panel  4. Obesity Encouraged to be more compliant with his diet  5. Primary hypercoagulable state Continue warfarin  6. Long term current use of anticoagulant therapy Continue warfarin  7. Palpitations observe  8. Pain of left arm Possible neurogenic pain, but I think this is more likely to be related to his shoulder . I think it is not likely to be circulatory or cardiac origin  9. Type 2 diabetes mellitus with complication - Hemoglobin X9B - Basic metabolic panel - Microalbumin, urine  10. Need for vaccination with 13-polyvalent pneumococcal conjugate vaccine - Pneumococcal conjugate vaccine 13-valent

## 2014-11-09 LAB — LIPID PANEL
CHOL/HDL RATIO: 2.4 ratio (ref 0.0–5.0)
CHOLESTEROL TOTAL: 127 mg/dL (ref 100–199)
HDL: 53 mg/dL (ref >39)
LDL Calculated: 57 mg/dL (ref 0–99)
Triglycerides: 85 mg/dL (ref 0–149)
VLDL Cholesterol Cal: 17 mg/dL (ref 5–40)

## 2014-11-09 LAB — BASIC METABOLIC PANEL
BUN / CREAT RATIO: 18 (ref 10–22)
BUN: 18 mg/dL (ref 8–27)
CO2: 25 mmol/L (ref 18–29)
CREATININE: 1.01 mg/dL (ref 0.76–1.27)
Calcium: 9.9 mg/dL (ref 8.6–10.2)
Chloride: 98 mmol/L (ref 97–108)
GFR calc Af Amer: 85 mL/min/{1.73_m2} (ref >59)
GFR calc non Af Amer: 73 mL/min/{1.73_m2} (ref >59)
Glucose: 97 mg/dL (ref 65–99)
Potassium: 4.7 mmol/L (ref 3.5–5.2)
Sodium: 138 mmol/L (ref 134–144)

## 2014-11-09 LAB — HEMOGLOBIN A1C
ESTIMATED AVERAGE GLUCOSE: 143 mg/dL
Hgb A1c MFr Bld: 6.6 % — ABNORMAL HIGH (ref 4.8–5.6)

## 2014-11-09 LAB — MICROALBUMIN, URINE: MICROALBUM., U, RANDOM: 5.8 ug/mL (ref 0.0–17.0)

## 2014-11-20 ENCOUNTER — Ambulatory Visit (INDEPENDENT_AMBULATORY_CARE_PROVIDER_SITE_OTHER): Payer: PPO | Admitting: Pharmacotherapy

## 2014-11-20 ENCOUNTER — Encounter: Payer: Self-pay | Admitting: Pharmacotherapy

## 2014-11-20 VITALS — BP 124/64 | HR 53 | Temp 97.4°F | Resp 20 | Ht 67.0 in | Wt 252.4 lb

## 2014-11-20 DIAGNOSIS — D6852 Prothrombin gene mutation: Secondary | ICD-10-CM

## 2014-11-20 DIAGNOSIS — Z7901 Long term (current) use of anticoagulants: Secondary | ICD-10-CM

## 2014-11-20 DIAGNOSIS — D6859 Other primary thrombophilia: Secondary | ICD-10-CM

## 2014-11-20 LAB — POCT INR: INR: 2.1

## 2014-11-20 NOTE — Progress Notes (Signed)
   Subjective:    Patient ID: Colton Miranda, male    DOB: 07-02-1941, 74 y.o.   MRN: [id number]  HPI Last INR on 10/09/14 was OK at 2.8 Current coumadin dose is 15mg  QD except 17.5mg  on Mondays Denies missed doses. Denies unusual bleeding or bruising. Denies CP, falls Consistent with vitamin K intake.   Review of Systems  HENT: Negative for nosebleeds.   Respiratory: Negative for shortness of breath.   Cardiovascular: Negative for chest pain.  Gastrointestinal: Negative for blood in stool and anal bleeding.  Genitourinary: Negative for hematuria.  Hematological: Does not bruise/bleed easily.       Objective:   Physical Exam  Constitutional: He is oriented to person, place, and time. He appears well-developed and well-nourished.  HENT:  Right Ear: External ear normal.  Left Ear: External ear normal.  Eyes:  Wears glasses  Cardiovascular: Normal rate, regular rhythm and normal heart sounds.   Pulmonary/Chest: Effort normal and breath sounds normal.  Neurological: He is alert and oriented to person, place, and time.  Skin: Skin is warm and dry.  Psychiatric: He has a normal mood and affect. His behavior is normal. Judgment and thought content normal.    BP:  124/64  HR:  53  Wt:  252lb INR 2.1        Assessment & Plan:  1.  INR at goal 2-3 2.  Continue Coumadin 15mg  QD except 17.5mg  on Mondays 3.  RTC in 6 weeks.

## 2014-11-20 NOTE — Patient Instructions (Signed)
INR 2.1 Continue Coumadin 15mg  daily except 17.5mg  on Mondays

## 2014-12-21 ENCOUNTER — Other Ambulatory Visit: Payer: Self-pay | Admitting: *Deleted

## 2014-12-21 MED ORDER — METFORMIN HCL 500 MG PO TABS: ORAL_TABLET | ORAL | Status: DC

## 2014-12-21 NOTE — Telephone Encounter (Signed)
Patient requested refill to be faxed to pharmacy.  

## 2015-01-01 ENCOUNTER — Encounter: Payer: Self-pay | Admitting: Pharmacotherapy

## 2015-01-01 ENCOUNTER — Ambulatory Visit (INDEPENDENT_AMBULATORY_CARE_PROVIDER_SITE_OTHER): Payer: PPO | Admitting: Pharmacotherapy

## 2015-01-01 VITALS — BP 118/66 | HR 52 | Temp 97.6°F | Resp 20 | Ht 67.0 in | Wt 249.6 lb

## 2015-01-01 DIAGNOSIS — Z7901 Long term (current) use of anticoagulants: Secondary | ICD-10-CM

## 2015-01-01 DIAGNOSIS — D6852 Prothrombin gene mutation: Secondary | ICD-10-CM

## 2015-01-01 DIAGNOSIS — D6859 Other primary thrombophilia: Secondary | ICD-10-CM

## 2015-01-01 LAB — POCT INR: INR: 1

## 2015-01-01 NOTE — Progress Notes (Signed)
   Subjective:    Patient ID: Colton Miranda, male    DOB: 1941/04/12, 74 y.o.   MRN: [id number]  HPI Last INR on 11/20/14 was OK at 2.1 Current coumadin dose is 15mg  QD except 17.5mg  on Mondays Denies missed doses. Denies unusual bleeding or bruising. Denies CP, SOB, falls. Did increase vitamin K foods.   Review of Systems  HENT: Negative for nosebleeds.   Respiratory: Negative for shortness of breath.   Cardiovascular: Negative for chest pain.  Gastrointestinal: Negative for blood in stool and anal bleeding.  Genitourinary: Negative for hematuria.  Hematological: Does not bruise/bleed easily.       Objective:   Physical Exam  Constitutional: He is oriented to person, place, and time. He appears well-developed and well-nourished.  HENT:  Right Ear: External ear normal.  Left Ear: External ear normal.  Cardiovascular: Normal rate, regular rhythm and normal heart sounds.   Pulmonary/Chest: Effort normal and breath sounds normal.  Neurological: He is alert and oriented to person, place, and time.  Skin: Skin is warm and dry.  Psychiatric: He has a normal mood and affect. His behavior is normal. Judgment and thought content normal.  Vitals reviewed.   BP:  118/66  HR:  52  Wt:  249lb INR 1.0       Assessment & Plan:  1.  INR below goal 2-3 due to increased vitamin K intake. 2.  Take Coumadin 17.5mg  x 3 days, then resume Coumadin 15mg  QD except 17.5mg  Mondays 3.  RTC in 3 weeks

## 2015-01-01 NOTE — Patient Instructions (Signed)
Take Coumadin 17.66m x 3 days, then resume Coumadin 154mdaily except 17.84m1mn Mondays Low INR due to spinach last night (INR 1.0)

## 2015-01-08 ENCOUNTER — Encounter: Payer: Self-pay | Admitting: Nurse Practitioner

## 2015-01-08 ENCOUNTER — Ambulatory Visit (INDEPENDENT_AMBULATORY_CARE_PROVIDER_SITE_OTHER): Payer: PPO | Admitting: Nurse Practitioner

## 2015-01-08 VITALS — BP 110/76 | HR 81 | Temp 98.2°F | Resp 20 | Ht 67.0 in | Wt 254.0 lb

## 2015-01-08 DIAGNOSIS — W1809XA Striking against other object with subsequent fall, initial encounter: Secondary | ICD-10-CM

## 2015-01-08 DIAGNOSIS — Z7901 Long term (current) use of anticoagulants: Secondary | ICD-10-CM | POA: Diagnosis not present

## 2015-01-08 DIAGNOSIS — S51012A Laceration without foreign body of left elbow, initial encounter: Secondary | ICD-10-CM | POA: Diagnosis not present

## 2015-01-08 LAB — POCT INR: INR: 1.5 — AB (ref <=1.1)

## 2015-01-08 NOTE — Progress Notes (Signed)
Patient ID: Colton Miranda, male   DOB: November 02, 1940, 74 y.o.   MRN: [id number]    PCP: Estill Dooms, MD  No Known Allergies  Chief Complaint  Patient presents with  . Acute Visit    [place] over weekend (left side pain)     HPI: Patient is a 74 y.o. male seen in the office today due to a fall over a curb 2 days ago. 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  Tripped while in was in the yard. Hit ribs and elbows. Left elbow with a good sized skin tear that bled a lot (he is on blood thinners). Recent INR 1.0 (ate spinach prior to INR last week).  Minimal pain associated with fall, took tramadol with good results. Still able to do all of his ADLs. No pain with breathing. Some tenderness to ribs when he gets up and sneezes otherwise without problems. Good strength bilaterally.  Cooks dinner every night, cooked last night and night before without any difficulty.    Review of Systems:  Review of Systems  Constitutional: Negative for fever, activity change, appetite change and fatigue.  Respiratory: Negative for cough and shortness of breath.   Cardiovascular: Negative for chest pain and leg swelling.  Gastrointestinal: Negative for diarrhea, constipation and abdominal distention.  Musculoskeletal: Positive for myalgias, arthralgias and gait problem. Negative for joint swelling.  Neurological: Negative for dizziness, facial asymmetry, weakness, numbness and headaches.  Hematological: Does not bruise/bleed easily.    Past Medical History  Diagnosis Date  . Diabetes mellitus 10/2010    T2DM, new dx   . Obesity (BMI 30-39.9)   . Hypertension   . Stroke   . Unspecified constipation   . Pain in limb   . Disturbance of skin sensation   . Memory loss   . Epistaxis   . Type II or unspecified type diabetes mellitus without mention of complication, not stated as uncontrolled   . Hypertrophy of prostate with urinary obstruction and other lower urinary tract symptoms (LUTS)   .  Edema   . Epistaxis   . Unspecified late effects of cerebrovascular disease   . Palpitations   . Nonspecific abnormal electrocardiogram (ECG) (EKG)   . Seborrheic dermatitis, unspecified   . Obesity, unspecified   . Type II or unspecified type diabetes mellitus without mention of complication, uncontrolled   . Basal cell carcinoma of skin of trunk, except scrotum   . Unspecified vitamin D deficiency   . Other and unspecified hyperlipidemia   . Tobacco use disorder   . 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 Right bundle branch block   . Other and unspecified disc disorder of lumbar region   . Chronic airway obstruction, not elsewhere classified   . Acute, but ill-defined, cerebrovascular disease   . Osteoarthrosis, unspecified whether generalized or localized, unspecified site   . Stiffness of joint, not elsewhere classified, unspecified site   . Myalgia and myositis, unspecified   . Undiagnosed cardiac murmurs   . Urinary frequency   . Long term (current) use of anticoagulants   . Pain in joint, lower leg   . Primary hypercoagulable state   . Other pulmonary embolism and infarction 1988  . Impacted cerumen 02/23/13   Past Surgical History  Procedure Laterality Date  . Cholecystectomy  1981  . Partial hip arthroplasty Right 06/18/2009    Dr. Delilah Shan  . Pilonidal cyst excision  1968    base of spine  .  Colonoscopy  2009   Social History:   reports that he quit smoking about 6 years ago. He has never used smokeless tobacco. He reports that he drinks about 1.8 oz of alcohol per week. He reports that he does not use illicit drugs.  Family History  Problem Relation Age of Onset  . Cancer Mother 53    colon , breast  . Heart attack Daughter     2008  . Parkinson's disease Sister     Medications: Patient's Medications  New Prescriptions   No medications on file  Previous Medications   CHOLECALCIFEROL (VITAMIN D) 2000 UNITS CAPS    Take 1 capsule by mouth daily.      LISINOPRIL (PRINIVIL,ZESTRIL) 20 MG TABLET    Take one tablet once a day for blood pressure   METFORMIN (GLUCOPHAGE) 500 MG TABLET    Take 1 tablet every morning to control blood sugar.   ONE TOUCH ULTRA TEST TEST STRIP    Use to check blood sugar three times daily. Not injecting insulin. Dx E11.65, Z02   ONETOUCH DELICA LANCETS FINE MISC    Use to check blood sugar three times daily. Not injecting insulin. Dx E11.65, I10   SIMVASTATIN (ZOCOR) 20 MG TABLET    Take one tablet once a day for cholesterol   TRAMADOL (ULTRAM) 50 MG TABLET    Take 1 tablet twice daily as needed for pain.   WARFARIN (COUMADIN) 10 MG TABLET    Take one tablet by mouth once daily   WARFARIN (COUMADIN) 5 MG TABLET    15 mg daily except 17.5mg  on Mondays: (1) 5mg  tab, 1 & 1/2 on Mondays/ (1) 10mg  tab)   ZOSTER VACCINE LIVE, PF, (ZOSTAVAX) 58527 UNT/0.65ML INJECTION    Inject 19,400 Units into the skin once.  Modified Medications   No medications on file  Discontinued Medications   No medications on file     Physical Exam:  Filed Vitals:   01/08/15 1457  BP: 110/76  Pulse: 81  Temp: 98.2 F (36.8 C)  TempSrc: Oral  Resp: 20  Height: 5\' 7"  (1.702 m)  Weight: 254 lb (115.214 kg)  SpO2: 94%    Physical Exam  Constitutional: He is oriented to person, place, and time. He appears well-developed and well-nourished.  HENT:  Right Ear: External ear normal.  Left Ear: External ear normal.  Mouth/Throat: Oropharynx is clear and moist. No oropharyngeal exudate.  Eyes: Conjunctivae and EOM are normal. Pupils are equal, round, and reactive to light.  Neck: Normal range of motion. Neck supple.  Cardiovascular: Normal rate, regular rhythm and normal heart sounds.   Pulmonary/Chest: Effort normal and breath sounds normal.  Abdominal: Soft. Bowel sounds are normal. He exhibits no distension. There is no tenderness.  Musculoskeletal: He exhibits no edema or tenderness.  No tenderness to left lower ribs when palpated, no  bruising noted   Neurological: He is alert and oriented to person, place, and time.  Skin: Skin is warm and dry. No erythema.  Bruising to right elbow, skin tear to left elbow- slight bleeding noted during dressing change. Otherwise no drainage, erythema noted    Psychiatric: He has a normal mood and affect. His behavior is normal. Judgment and thought content normal.  Vitals reviewed.   Labs reviewed: Basic Metabolic Panel:  Recent Labs  02/20/14 0859 06/30/14 0832 11/08/14 1607  NA 138 139 138  K 4.4 4.4 4.7  CL 102 102 98  CO2 20 23 25   GLUCOSE 106*  97 97  BUN 19 17 18   CREATININE 1.22 1.13 1.01  CALCIUM 9.3 9.5 9.9   Liver Function Tests:  Recent Labs  02/20/14 0859 06/30/14 0832  AST 23 21  ALT 24 22  ALKPHOS 63 57  BILITOT 0.5 0.4  PROT 6.8 6.9   No results for input(s): LIPASE, AMYLASE in the last 8760 hours. No results for input(s): AMMONIA in the last 8760 hours. CBC: No results for input(s): WBC, NEUTROABS, HGB, HCT, MCV, PLT in the last 8760 hours. Lipid Panel:  Recent Labs  02/20/14 0859 11/08/14 1607  CHOL 104 127  HDL 45 53  LDLCALC 48 57  TRIG 57 85  CHOLHDL 2.3 2.4   TSH: No results for input(s): TSH in the last 8760 hours. A1C: Lab Results  Component Value Date   HGBA1C 6.6* 11/08/2014   Lab Results  Component Value Date   INR 1.5* 01/08/2015   INR 1.0 01/01/2015   INR 2.1 11/20/2014     Assessment/Plan  1. Fall against object, initial encounter -tripped over toe of slippers while taking in the groceries, other than skin tear and contusion to elbows assessment within normal limits. Discussed fall precautions, use of properly fitting shoes and proper shoes to wear. Being aware of changes in elevation when walking. Overall mildly tender and has not effected his ADLs- Cont with tramadol as needed.   2. Skin tear of elbow without complication, left, initial encounter -discussed keeping area clean, dressing changes 1-2 times daily  as needed. May use neosporin as needed. Educated to follow up if area is not healing, increased drainage, redness, tenderness or heat.   3. Long term current use of anticoagulant therapy INR1.0 with [facility], now 1.5 today. Dose was adjusted last week and it has increased will not change coumadin today, pt to keep appt with [facility]. To maintain consistent vit K intake  Follow up as needed and if increased pain or weakness occurs.

## 2015-01-22 ENCOUNTER — Ambulatory Visit (INDEPENDENT_AMBULATORY_CARE_PROVIDER_SITE_OTHER): Payer: PPO | Admitting: Pharmacotherapy

## 2015-01-22 ENCOUNTER — Telehealth: Payer: Self-pay

## 2015-01-22 ENCOUNTER — Encounter: Payer: Self-pay | Admitting: Pharmacotherapy

## 2015-01-22 VITALS — BP 132/78 | HR 55 | Temp 98.0°F | Wt 250.0 lb

## 2015-01-22 DIAGNOSIS — Z7901 Long term (current) use of anticoagulants: Secondary | ICD-10-CM | POA: Diagnosis not present

## 2015-01-22 DIAGNOSIS — D6859 Other primary thrombophilia: Secondary | ICD-10-CM

## 2015-01-22 DIAGNOSIS — D6852 Prothrombin gene mutation: Secondary | ICD-10-CM

## 2015-01-22 LAB — POCT INR: INR: 1.6

## 2015-01-22 MED ORDER — TRAMADOL HCL 50 MG PO TABS: ORAL_TABLET | ORAL | Status: DC

## 2015-01-22 MED ORDER — WARFARIN SODIUM 5 MG PO TABS: ORAL_TABLET | ORAL | Status: DC

## 2015-01-22 NOTE — Telephone Encounter (Signed)
Patient was in office today for PT/INR check, patient asked for refill on Tramadol. I indicated rx was given in February 2016 for a year supply. Patient states he is taking medication more than prescribed and the pharmacy said he is not due for refill until 2 weeks from now unless he has new rx with increased dose or change in instructions. Patient states since fall he is taking 4 x daily vs 2 times daily as prescribed.  Please advise, ? If patient should be seen again to assess pain (seen on 01/08/15), ? If x-rays or imaging to be completed or if new rx to be given for Tramadol

## 2015-01-22 NOTE — Patient Instructions (Signed)
INR 1.6 Increase Coumadin 15mg  daily except 17.5mg  Mondays and Thursdays

## 2015-01-22 NOTE — Progress Notes (Signed)
   Subjective:    Patient ID: Colton Miranda, male    DOB: 06/27/1941, 74 y.o.   MRN: [id number]  HPI Last INR was low at 1.0 Current Coumadin dose is 15mg  QD except 17.5mg  on Mondays Did have a fall 2 & 1/2 weeks ago.  Still has chest wall pain.  Scabs on arms. Denies missed doses. Consistent with vitamin K intake  Average BG:  118mg /dl No hypoglycemai   Review of Systems  HENT: Negative for nosebleeds.   Respiratory: Negative for shortness of breath.   Cardiovascular: Negative for chest pain.  Gastrointestinal: Negative for blood in stool and anal bleeding.  Genitourinary: Negative for hematuria.  Hematological: Bruises/bleeds easily.       Objective:   Physical Exam  Constitutional: He is oriented to person, place, and time. He appears well-developed and well-nourished.  HENT:  Right Ear: External ear normal.  Left Ear: External ear normal.  Cardiovascular: Normal rate, regular rhythm and normal heart sounds.   Pulmonary/Chest: Effort normal and breath sounds normal.  Neurological: He is alert and oriented to person, place, and time.  Skin:  Scabs on arms  Psychiatric: He has a normal mood and affect. His behavior is normal. Judgment and thought content normal.  Vitals reviewed.   BP:  132/78  HR:  55 wt:  250lb INR 1.6      Assessment & Plan:  1.  INR below goal 2-3. 2.  Increase Coumadin 15mg  QD except 17.5mg  M/Th 3.  RTC 4 weeks

## 2015-01-22 NOTE — Telephone Encounter (Signed)
It is okay to give him a new prescription for 100 tablets with directions to take one up to 4 times daily as needed for pain.

## 2015-01-22 NOTE — Telephone Encounter (Signed)
Rx called to pharmacy and patient notified and agreed.

## 2015-02-26 ENCOUNTER — Ambulatory Visit (INDEPENDENT_AMBULATORY_CARE_PROVIDER_SITE_OTHER): Payer: PPO | Admitting: Pharmacotherapy

## 2015-02-26 ENCOUNTER — Encounter: Payer: Self-pay | Admitting: Pharmacotherapy

## 2015-02-26 VITALS — BP 120/78 | HR 53 | Temp 97.6°F | Resp 20 | Ht 67.0 in | Wt 250.8 lb

## 2015-02-26 DIAGNOSIS — D6859 Other primary thrombophilia: Secondary | ICD-10-CM

## 2015-02-26 DIAGNOSIS — D6852 Prothrombin gene mutation: Secondary | ICD-10-CM | POA: Diagnosis not present

## 2015-02-26 DIAGNOSIS — Z7901 Long term (current) use of anticoagulants: Secondary | ICD-10-CM | POA: Diagnosis not present

## 2015-02-26 LAB — POCT INR: INR: 1.4

## 2015-02-26 MED ORDER — WARFARIN SODIUM 5 MG PO TABS: ORAL_TABLET | ORAL | Status: DC

## 2015-02-26 NOTE — Progress Notes (Signed)
   Subjective:    Patient ID: Colton Miranda, male    DOB: 1941-09-14, 74 y.o.   MRN: [id number]  HPI Last INR on 01/22/15 was low at 1.6 Coumadin was increased to 15mg  QD except 17.5mg  M/Th Did miss a dose 2 days ago Denies unusual bleeding or bruising Denies CP, falls Consistent with vitamin K  Average BG:  118mg /dl No hypoglycemia   Review of Systems  HENT: Negative for nosebleeds.   Cardiovascular: Negative for chest pain and palpitations.  Gastrointestinal: Negative for blood in stool and anal bleeding.  Genitourinary: Negative for hematuria.  Hematological: Does not bruise/bleed easily.       Objective:   Physical Exam  Constitutional: He is oriented to person, place, and time. He appears well-developed and well-nourished.  HENT:  Right Ear: External ear normal.  Left Ear: External ear normal.  Cardiovascular: Normal rate, regular rhythm and normal heart sounds.   Pulmonary/Chest: Effort normal.  Neurological: He is alert and oriented to person, place, and time.  Skin: Skin is warm and dry.  Psychiatric: He has a normal mood and affect. His behavior is normal. Judgment and thought content normal.  Vitals reviewed.   INR 1.4 BP:  120/78  HR:  53  Wt:  250lb      Assessment & Plan:  1.  INR below goal 2-3 2.  Increase Coumadin 15mg  QD except 17.5mg  MWF

## 2015-03-16 ENCOUNTER — Other Ambulatory Visit: Payer: Self-pay | Admitting: Internal Medicine

## 2015-03-18 ENCOUNTER — Other Ambulatory Visit: Payer: Self-pay | Admitting: Internal Medicine

## 2015-03-21 ENCOUNTER — Encounter: Payer: Self-pay | Admitting: Internal Medicine

## 2015-03-21 ENCOUNTER — Ambulatory Visit (INDEPENDENT_AMBULATORY_CARE_PROVIDER_SITE_OTHER): Payer: PPO | Admitting: Internal Medicine

## 2015-03-21 VITALS — BP 110/72 | HR 51 | Temp 97.7°F | Resp 20 | Ht 67.0 in | Wt 253.4 lb

## 2015-03-21 DIAGNOSIS — Z7901 Long term (current) use of anticoagulants: Secondary | ICD-10-CM

## 2015-03-21 DIAGNOSIS — D6859 Other primary thrombophilia: Secondary | ICD-10-CM

## 2015-03-21 DIAGNOSIS — E119 Type 2 diabetes mellitus without complications: Secondary | ICD-10-CM | POA: Diagnosis not present

## 2015-03-21 DIAGNOSIS — E785 Hyperlipidemia, unspecified: Secondary | ICD-10-CM | POA: Diagnosis not present

## 2015-03-21 DIAGNOSIS — D6852 Prothrombin gene mutation: Secondary | ICD-10-CM | POA: Diagnosis not present

## 2015-03-21 DIAGNOSIS — I10 Essential (primary) hypertension: Secondary | ICD-10-CM

## 2015-03-21 DIAGNOSIS — E669 Obesity, unspecified: Secondary | ICD-10-CM | POA: Diagnosis not present

## 2015-03-21 NOTE — Progress Notes (Signed)
Patient ID: Colton Miranda, male   DOB: [birth year], 74 y.o.   MRN: [id number]    Facility  [facility]    Place of Service:   OFFICE    No Known Allergies  Chief Complaint  Patient presents with  . Medical Management of Chronic Issues    4 month follow-up    HPI:  Essential hypertension - controlled  Hyperlipemia - controlled when last checked in February 2016. LDL 57.  Type 2 diabetes mellitus without complication - controlled   Obesity - unchanged. Trend line has been for weight gains since 2014  Long term current use of anticoagulant therapy - hypercoagulable state. INR subtherapeutic on 02/26/2015. Warfarin dose changed to 15 mg daily except 17.5 mg Monday Wednesday and Friday. Patient has follow-up appointment with pharmacist Oretha Ellis on 04/02/2015.  Primary hypercoagulable state: Anticoagulated with warfarin. Has regular visits with clinical pharmacologist, Oretha Ellis.    Medications: Patient's Medications  New Prescriptions   No medications on file  Previous Medications   CHOLECALCIFEROL (VITAMIN D) 2000 UNITS CAPS    Take 1 capsule by mouth daily.     LISINOPRIL (PRINIVIL,ZESTRIL) 20 MG TABLET    TAKE ONE TABLET BY MOUTH ONCE DAILY FOR BLOOD PRESSURE   METFORMIN (GLUCOPHAGE) 500 MG TABLET    Take 1 tablet every morning to control blood sugar.   ONE TOUCH ULTRA TEST TEST STRIP    Use to check blood sugar three times daily. Not injecting insulin. Dx E11.65, Y50   ONETOUCH DELICA LANCETS FINE MISC    Use to check blood sugar three times daily. Not injecting insulin. Dx E11.65, I10   SIMVASTATIN (ZOCOR) 20 MG TABLET    TAKE ONE TABLET BY MOUTH ONCE DAILY   TRAMADOL (ULTRAM) 50 MG TABLET    Take one tablet by mouth up to four times daily for pain   WARFARIN (COUMADIN) 10 MG TABLET    TAKE ONE TABLET BY MOUTH ONCE DAILY   WARFARIN (COUMADIN) 5 MG TABLET    15 mg daily except 17.5mg  on Mondays, Wednedays  and Fridays: (1) 5mg  tab, 1 & 1/2 on Mondays/Wed/Fri  (1) 10mg   tab)  Modified Medications   No medications on file  Discontinued Medications   No medications on file     Review of Systems  Constitutional: Negative for chills, activity change and appetite change.  HENT: Positive for hearing loss and tinnitus. Negative for ear pain and nosebleeds.   Eyes:       Corrective lenses  Respiratory: Negative for chest tightness and shortness of breath.   Cardiovascular: Positive for leg swelling. Negative for chest pain and palpitations.  Gastrointestinal: Negative for blood in stool and anal bleeding.  Endocrine:       Diabetic  Genitourinary: Negative for hematuria.       Nocturia x 4. Some urgency. Normal stream.  Musculoskeletal: Positive for back pain and arthralgias (shoulder pains).  Skin: Negative.   Neurological: Negative for dizziness, tremors, seizures, syncope, facial asymmetry, speech difficulty, weakness, light-headedness, numbness and headaches.       Previous problems with paresthesias and discomfort in the left arm have not recurred in the last few months.  Hematological: Negative.        History of hypercoagulable state.  Psychiatric/Behavioral:       Problems with insomnia. Wakes at night. He is up at 4 AM.    Filed Vitals:   03/21/15 1446  BP: 110/72  Pulse: 51  Temp: 97.7 F (36.5 C)  TempSrc: Oral  Resp: 20  Height: 5\' 7"  (1.702 m)  Weight: 253 lb 6.4 oz (114.941 kg)  SpO2: 94%   Body mass index is 39.68 kg/(m^2).  Physical Exam  Constitutional: He is oriented to person, place, and time.  Obese  HENT:  History of cerumen impactions  Eyes:   Corrective lenses  Neck: Normal range of motion. Neck supple. No JVD present. No tracheal deviation present. No thyromegaly present.  Cardiovascular: Normal rate, regular rhythm and normal heart sounds.  Exam reveals no gallop and no friction rub.   No murmur heard. Pulmonary/Chest: Effort normal and breath sounds normal. No respiratory distress. He has no wheezes. He has no  rales.  Abdominal: Soft. Bowel sounds are normal. He exhibits no distension and no mass. There is no tenderness.  Musculoskeletal:  Back discomfort to palpation. 3+ bipedal edema. No calf tenderness. Reduced ability to raise left arm at the shoulder. Wobbly gait. Using left knee brace with Velcro closures.  Lymphadenopathy:    He has no cervical adenopathy.  Neurological: He is alert and oriented to person, place, and time. No cranial nerve deficit.  Skin: Skin is warm and dry. No rash noted. No erythema. No pallor.  Psychiatric: He has a normal mood and affect. His behavior is normal. Thought content normal.     Labs reviewed: Office Visit on 02/26/2015  Component Date Value Ref Range Status  . INR 02/26/2015 1.4   Final  Office Visit on 01/22/2015  Component Date Value Ref Range Status  . INR 01/22/2015 1.6   Final  Office Visit on 01/08/2015  Component Date Value Ref Range Status  . INR 01/08/2015 1.5* .9 - 1.1 Final  Office Visit on 01/01/2015  Component Date Value Ref Range Status  . INR 01/01/2015 1.0   Final     Assessment/Plan  1. Essential hypertension Continue current medication  2. Hyperlipemia - Lipid panel; Future  3. Type 2 diabetes mellitus without complication - Hemoglobin A1c; Future - Comprehensive metabolic panel; Future  4. Obesity Discussed dietary management  5. Long term current use of anticoagulant therapy Continue current medications and see Oretha Ellis, clinical pharmacologist as scheduled.  6. Primary hypercoagulable state Continue warfarin and clinical pharmacologist visits

## 2015-04-02 ENCOUNTER — Encounter: Payer: Self-pay | Admitting: Pharmacotherapy

## 2015-04-02 ENCOUNTER — Ambulatory Visit (INDEPENDENT_AMBULATORY_CARE_PROVIDER_SITE_OTHER): Payer: PPO | Admitting: Pharmacotherapy

## 2015-04-02 VITALS — BP 130/72 | HR 60 | Temp 98.2°F | Resp 20 | Ht 67.0 in | Wt 251.2 lb

## 2015-04-02 DIAGNOSIS — Z7901 Long term (current) use of anticoagulants: Secondary | ICD-10-CM

## 2015-04-02 DIAGNOSIS — D6852 Prothrombin gene mutation: Secondary | ICD-10-CM

## 2015-04-02 DIAGNOSIS — D6859 Other primary thrombophilia: Secondary | ICD-10-CM

## 2015-04-02 LAB — POCT INR: INR: 2.5

## 2015-04-02 NOTE — Progress Notes (Signed)
   Subjective:    Patient ID: Colton Miranda, male    DOB: 21-Nov-1940, 74 y.o.   MRN: [id number]  HPI Last INR on 02/26/15 was low at 1.4 Coumadin was increased to 15mg  QD except 17.5mg  MWF Denies missed doses.  Denies unusual bleeding or bruising Denies CP, falls Consistent with vitamin K intake.  Average BG:  122mg /dl     Review of Systems  HENT: Negative for nosebleeds.   Respiratory: Negative for shortness of breath.   Cardiovascular: Negative for chest pain and palpitations.  Gastrointestinal: Negative for anal bleeding.  Genitourinary: Negative for hematuria.  Hematological: Bruises/bleeds easily.       Objective:   Physical Exam  Constitutional: He is oriented to person, place, and time. He appears well-developed and well-nourished.  HENT:  Right Ear: External ear normal.  Left Ear: External ear normal.  Cardiovascular: Normal rate, regular rhythm and normal heart sounds.   Pulmonary/Chest: Effort normal and breath sounds normal.  Neurological: He is alert and oriented to person, place, and time.  Skin: Skin is warm and dry.  Psychiatric: He has a normal mood and affect. His behavior is normal. Judgment and thought content normal.  Vitals reviewed.   BP:  130/72  HR:  60  Wt:  251lb INR 2.5      Assessment & Plan:  1.  INR at goal 2-3 2.  Continue Coumadin 15mg  QD except 17.5mg  MWF 3.  RTC in 6 weeks

## 2015-04-02 NOTE — Patient Instructions (Signed)
INR 2.5 Continue Coumadin 15mg  daily except 17.5mg  on Mondays, Wednesdays, and Fridays

## 2015-05-14 ENCOUNTER — Other Ambulatory Visit: Payer: Self-pay | Admitting: *Deleted

## 2015-05-14 ENCOUNTER — Encounter: Payer: Self-pay | Admitting: Pharmacotherapy

## 2015-05-14 ENCOUNTER — Ambulatory Visit (INDEPENDENT_AMBULATORY_CARE_PROVIDER_SITE_OTHER): Payer: PPO | Admitting: Pharmacotherapy

## 2015-05-14 VITALS — BP 160/90 | HR 51 | Temp 97.8°F | Resp 20 | Ht 67.0 in | Wt 251.0 lb

## 2015-05-14 DIAGNOSIS — Z7901 Long term (current) use of anticoagulants: Secondary | ICD-10-CM | POA: Diagnosis not present

## 2015-05-14 DIAGNOSIS — D6852 Prothrombin gene mutation: Secondary | ICD-10-CM

## 2015-05-14 DIAGNOSIS — D6859 Other primary thrombophilia: Secondary | ICD-10-CM

## 2015-05-14 LAB — POCT INR: INR: 2.8

## 2015-05-14 MED ORDER — AMBULATORY NON FORMULARY MEDICATION: Status: DC

## 2015-05-14 NOTE — Telephone Encounter (Signed)
Patient wife called and stated that they needed a new blood sugar machine and insurance and coupon will cover One Tough Verio. And that's what they want instead of Cathy's recommendation of One Touch Ultra 2.

## 2015-05-14 NOTE — Progress Notes (Signed)
   Subjective:    Patient ID: Colton Miranda, male    DOB: 1941/07/12, 74 y.o.   MRN: [id number]  HPI Last iNR was OK at 2.5 Denies missed doses Denies unusual bleeding or bruising Denies CP, falls Consistent with vitamin K intake   Review of Systems  HENT: Negative for nosebleeds.   Cardiovascular: Negative for chest pain.  Gastrointestinal: Negative for anal bleeding.  Genitourinary: Negative for hematuria.  Hematological: Does not bruise/bleed easily.       Objective:   Physical Exam  Constitutional: He is oriented to person, place, and time. He appears well-developed and well-nourished.  HENT:  Right Ear: External ear normal.  Left Ear: External ear normal.  Cardiovascular: Normal rate, regular rhythm and normal heart sounds.   Pulmonary/Chest: Effort normal and breath sounds normal.  Neurological: He is alert and oriented to person, place, and time.  Skin: Skin is warm and dry.  Psychiatric: He has a normal mood and affect. His behavior is normal. Judgment and thought content normal.  Vitals reviewed.   INR 2.8 BP:  160/90  HR: 51  Wt: 251      Assessment & Plan:  1.  INR at goal 2-3 2.  Continue Coumadin 15mg  daily except 17.5mg  MWF 3.  RTC in 6 weeks

## 2015-06-25 ENCOUNTER — Ambulatory Visit (INDEPENDENT_AMBULATORY_CARE_PROVIDER_SITE_OTHER): Payer: PPO | Admitting: Pharmacotherapy

## 2015-06-25 ENCOUNTER — Encounter: Payer: Self-pay | Admitting: Pharmacotherapy

## 2015-06-25 VITALS — BP 126/84 | HR 65 | Temp 97.7°F | Resp 20 | Ht 67.0 in | Wt 253.0 lb

## 2015-06-25 DIAGNOSIS — Z7901 Long term (current) use of anticoagulants: Secondary | ICD-10-CM | POA: Diagnosis not present

## 2015-06-25 DIAGNOSIS — D6859 Other primary thrombophilia: Secondary | ICD-10-CM

## 2015-06-25 LAB — POCT INR: INR: 2.3

## 2015-06-25 NOTE — Progress Notes (Signed)
   Subjective:    Patient ID: Colton Miranda, male    DOB: [birth year], 74 y.o.   MRN: [id number]  HPI  Last INR on 05/14/15  Was OK at 2.8 Current Coumadin dose is 15mg  QD except 17.5mg  MWF Denies missed doses. Denies unusual bleeding or bruising. Denies CP, SOB, falls Consistent with vitamin K intake.  Average BG:  126mg /dl    Review of Systems  HENT: Negative for nosebleeds.   Respiratory: Negative for shortness of breath.   Cardiovascular: Negative for chest pain.  Gastrointestinal: Negative for anal bleeding.  Genitourinary: Negative for hematuria.  Hematological: Does not bruise/bleed easily.       Objective:   Physical Exam  Constitutional: He is oriented to person, place, and time. He appears well-developed and well-nourished.  HENT:  Right Ear: External ear normal.  Left Ear: External ear normal.  Cardiovascular: Normal rate, regular rhythm and normal heart sounds.   Pulmonary/Chest: Effort normal and breath sounds normal.  Neurological: He is alert and oriented to person, place, and time.  Skin: Skin is warm and dry.  Psychiatric: He has a normal mood and affect. His behavior is normal. Judgment and thought content normal.  Vitals reviewed.   INR 2.3 BP:  126/84  HR:  65  Wt:  253lb       Assessment & Plan:  1  INR at goal 2-3 2.  Continue Coumadin 15mg  QD except 17.5mg  MWF 3. INR in 6 weeks

## 2015-06-25 NOTE — Patient Instructions (Signed)
INR 2.3 Continue Coumadin 15mg  daily except 17.5mg  on Mondays, Wednesdays, and Fridays

## 2015-07-05 ENCOUNTER — Ambulatory Visit (INDEPENDENT_AMBULATORY_CARE_PROVIDER_SITE_OTHER): Payer: PPO | Admitting: Nurse Practitioner

## 2015-07-05 ENCOUNTER — Encounter: Payer: Self-pay | Admitting: Nurse Practitioner

## 2015-07-05 VITALS — BP 130/80 | HR 57 | Temp 97.2°F | Resp 20 | Ht 67.0 in | Wt 254.2 lb

## 2015-07-05 DIAGNOSIS — W19XXXA Unspecified fall, initial encounter: Secondary | ICD-10-CM

## 2015-07-05 DIAGNOSIS — B372 Candidiasis of skin and nail: Secondary | ICD-10-CM

## 2015-07-05 DIAGNOSIS — M25551 Pain in right hip: Secondary | ICD-10-CM

## 2015-07-05 MED ORDER — NYSTATIN 100000 UNIT/GM EX POWD
CUTANEOUS | Status: DC
Start: 2015-07-05 — End: 2015-12-03

## 2015-07-05 NOTE — Progress Notes (Signed)
Patient ID: Colton Miranda, male   DOB: March 10, 1941, 74 y.o.   MRN: [id number]    PCP: Estill Dooms, MD  No Known Allergies  Chief Complaint  Patient presents with  . Acute Visit    Fell outside(am) hurt rt hip and knee, bruising on both elbows,      HPI: Patient is a 74 y.o. male seen in the office today due to fall. Was walking dog outside and tripped over the curb when trying to go from grass to sidewalk. Second fall in 2 months. Pt with hx of Dm, htn, CVA, OA, hypercoagulable state on coumadin. Feel on side. Did not hit head. Could not get up after the fall.  EMS called. Mentation has been baseline but wife reports he has been tired and wants to sleep which is normal for him. Does not sleep well at night gets up at 5 am and normal takes a nap after he takes dog out for a walk.  No pain to leg or hip prior to pain. Having some pain to hip now. Pain 4/10 to right hip. Needing a little support to walk. Wife helped him walk into appt.  Increase pain when walking but able to walk without help throughout office  Review of Systems:  Review of Systems  Constitutional: Negative for activity change and appetite change.  Musculoskeletal: Positive for arthralgias.       Pain to right hip after fall  Chronic back pain no change No pain to elbow or knee  Skin:       bruising noted to right elbow and hip   Neurological: Negative for dizziness, tremors, speech difficulty, weakness, numbness and headaches.  Hematological: Bruises/bleeds easily.    Past Medical History  Diagnosis Date  . Diabetes mellitus 10/2010    T2DM, new dx   . Obesity (BMI 30-39.9)   . Hypertension   . Stroke ([place])   . Unspecified constipation   . Pain in limb   . Disturbance of skin sensation   . Memory loss   . Epistaxis   . Type II or unspecified type diabetes mellitus without mention of complication, not stated as uncontrolled   . Hypertrophy of prostate with urinary obstruction and other lower urinary  tract symptoms (LUTS)   . Edema   . Epistaxis   . Unspecified late effects of cerebrovascular disease   . Palpitations   . Nonspecific abnormal electrocardiogram (ECG) (EKG)   . Seborrheic dermatitis, unspecified   . Obesity, unspecified   . Type II or unspecified type diabetes mellitus without mention of complication, uncontrolled   . Basal cell carcinoma of skin of trunk, except scrotum   . Unspecified vitamin D deficiency   . Other and unspecified hyperlipidemia   . Tobacco use disorder   . 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 Right bundle branch block   . Other and unspecified disc disorder of lumbar region   . Chronic airway obstruction, not elsewhere classified   . Acute, but ill-defined, cerebrovascular disease ([place])   . Osteoarthrosis, unspecified whether generalized or localized, unspecified site   . Stiffness of joint, not elsewhere classified, unspecified site   . Myalgia and myositis, unspecified   . Undiagnosed cardiac murmurs   . Urinary frequency   . Long term (current) use of anticoagulants   . Pain in joint, lower leg   . Primary hypercoagulable state ([place])   . Other pulmonary embolism and infarction 1988  . Impacted cerumen 02/23/13  Past Surgical History  Procedure Laterality Date  . Cholecystectomy  1981  . Partial hip arthroplasty Right 06/18/2009    Dr. Delilah Shan  . Pilonidal cyst excision  1968    base of spine  . Colonoscopy  2009   Social History:   reports that he quit smoking about 6 years ago. He has never used smokeless tobacco. He reports that he drinks about 1.8 oz of alcohol per week. He reports that he does not use illicit drugs.  Family History  Problem Relation Age of Onset  . Cancer Mother 52    colon , breast  . Heart attack Daughter     2008  . Parkinson's disease Sister     Medications: Patient's Medications  New Prescriptions   No medications on file  Previous Medications   AMBULATORY NON FORMULARY MEDICATION    One Touch  Verio Sugar Machine Use as Directed in testing blood sugar Dx:E11.65   AMBULATORY NON FORMULARY MEDICATION    One Touch Verio Test Strips Use to test blood sugar twice daily Dx: E11.65   AMBULATORY NON FORMULARY MEDICATION    One Touch Verio Lancets Use to test blood sugar  Dx:E11.65   CHOLECALCIFEROL (VITAMIN D) 2000 UNITS CAPS    Take 1 capsule by mouth daily.     LISINOPRIL (PRINIVIL,ZESTRIL) 20 MG TABLET    TAKE ONE TABLET BY MOUTH ONCE DAILY FOR BLOOD PRESSURE   METFORMIN (GLUCOPHAGE) 500 MG TABLET    Take 1 tablet every morning to control blood sugar.   SIMVASTATIN (ZOCOR) 20 MG TABLET    TAKE ONE TABLET BY MOUTH ONCE DAILY   TRAMADOL (ULTRAM) 50 MG TABLET    Take one tablet by mouth up to four times daily for pain   WARFARIN (COUMADIN) 10 MG TABLET    TAKE ONE TABLET BY MOUTH ONCE DAILY   WARFARIN (COUMADIN) 5 MG TABLET    15 mg daily except 17.5mg  on Mondays, Wednedays  and Fridays: (1) 5mg  tab, 1 & 1/2 on Mondays/Wed/Fri  (1) 10mg  tab)  Modified Medications   No medications on file  Discontinued Medications   No medications on file     Physical Exam:  Filed Vitals:   07/05/15 1021  BP: 130/80  Pulse: 57  Temp: 97.2 F (36.2 C)  TempSrc: Oral  Resp: 20  Height: 5\' 7"  (1.702 m)  Weight: 254 lb 3.2 oz (115.304 kg)  SpO2: 97%   Body mass index is 39.8 kg/(m^2).  Physical Exam  Constitutional: He appears well-developed and well-nourished.  HENT:  Head: Normocephalic and atraumatic.  Eyes: Conjunctivae are normal. Pupils are equal, round, and reactive to light.  Cardiovascular: Normal rate, regular rhythm and normal heart sounds.   Pulmonary/Chest: Effort normal and breath sounds normal.  Musculoskeletal: He exhibits no edema or tenderness.       Right hip: He exhibits decreased range of motion. He exhibits normal strength, no tenderness, no bony tenderness and no swelling.       Left hip: He exhibits decreased range of motion.  Small-moderate amount of bruising  noted to right hip. No tenderness with ROM, bilaterally with decreased ROM to hips.   Neurological: He is alert.    Labs reviewed: Basic Metabolic Panel:  Recent Labs  11/08/14 1607  NA 138  K 4.7  CL 98  CO2 25  GLUCOSE 97  BUN 18  CREATININE 1.01  CALCIUM 9.9   Liver Function Tests: No results for input(s): AST, ALT, ALKPHOS, BILITOT, PROT,  ALBUMIN in the last 8760 hours. No results for input(s): LIPASE, AMYLASE in the last 8760 hours. No results for input(s): AMMONIA in the last 8760 hours. CBC: No results for input(s): WBC, NEUTROABS, HGB, HCT, MCV, PLT in the last 8760 hours. Lipid Panel:  Recent Labs  11/08/14 1607  CHOL 127  HDL 53  LDLCALC 57  TRIG 85  CHOLHDL 2.4   TSH: No results for input(s): TSH in the last 8760 hours. A1C: Lab Results  Component Value Date   HGBA1C 6.6* 11/08/2014    Lab Results  Component Value Date   INR 2.3 06/25/2015   INR 2.8 05/14/2015   INR 2.5 04/02/2015     Assessment/Plan 1. Fall, initial encounter -2 falls in 2 months. Does not endorse a lot of pain but does have tenderness to hip, able to bear weight without problems.  -slight briusing to hip, anticipate should get worse due to being on coumadin, precaution discussed with wife.  - Ambulatory referral to Physical Therapy due to 2 falls in 2 months for gait and strength training. Also may need assistive device and to assess for need.   2. Candidal skin infection To abdominal fold, cleanse thoroughly, dry area and apply powder twice daily  - nystatin (MYCOSTATIN/NYSTOP) 100000 UNIT/GM POWD; Twice daily to affected area  Dispense: 15 g; Refill: 3  3. Right hip pain -after fall, has Rx for tramadol to use as needed - Ambulatory referral to Physical Therapy   Jessica K. Harle Battiest  [facility] [phone] 8 am - 5 pm) [phone] (after hours)

## 2015-07-05 NOTE — Patient Instructions (Signed)
Nystatin power to skin twice daily for yeast  To use tramadol as needed for pain- do not exceed 6 tablets in 24 hours  Will get therapy referral for gait training after falls  Notify if pain persist or unable to ambulate

## 2015-07-23 ENCOUNTER — Ambulatory Visit: Payer: PPO | Attending: Nurse Practitioner

## 2015-07-23 ENCOUNTER — Other Ambulatory Visit: Payer: PPO

## 2015-07-23 DIAGNOSIS — R29898 Other symptoms and signs involving the musculoskeletal system: Secondary | ICD-10-CM | POA: Insufficient documentation

## 2015-07-23 DIAGNOSIS — E785 Hyperlipidemia, unspecified: Secondary | ICD-10-CM

## 2015-07-23 DIAGNOSIS — R262 Difficulty in walking, not elsewhere classified: Secondary | ICD-10-CM | POA: Diagnosis present

## 2015-07-23 DIAGNOSIS — E119 Type 2 diabetes mellitus without complications: Secondary | ICD-10-CM

## 2015-07-23 DIAGNOSIS — R269 Unspecified abnormalities of gait and mobility: Secondary | ICD-10-CM | POA: Diagnosis present

## 2015-07-23 NOTE — Therapy (Signed)
[facility] [address], [place], [place], [address] Phone: [phone]   Fax:  [phone]  Physical Therapy Evaluation  Patient Details  Name: Colton Miranda MRN: [id number] Date of Birth: 1941/08/26 Referring Provider: Sherrie Mustache, NP  Encounter Date: 07/23/2015      PT End of Session - 07/23/15 1523    Visit Number 1   Number of Visits 10   Date for PT Re-Evaluation 09/17/15   PT Start Time 1450   PT Stop Time 1522   PT Time Calculation (min) 32 min   Activity Tolerance Patient tolerated treatment well   Behavior During Therapy [facility] for tasks assessed/performed      Past Medical History  Diagnosis Date  . Diabetes mellitus 10/2010    T2DM, new dx   . Obesity (BMI 30-39.9)   . Hypertension   . Stroke ([place])   . Unspecified constipation   . Pain in limb   . Disturbance of skin sensation   . Memory loss   . Epistaxis   . Type II or unspecified type diabetes mellitus without mention of complication, not stated as uncontrolled   . Hypertrophy of prostate with urinary obstruction and other lower urinary tract symptoms (LUTS)   . Edema   . Epistaxis   . Unspecified late effects of cerebrovascular disease   . Palpitations   . Nonspecific abnormal electrocardiogram (ECG) (EKG)   . Seborrheic dermatitis, unspecified   . Obesity, unspecified   . Type II or unspecified type diabetes mellitus without mention of complication, uncontrolled   . Basal cell carcinoma of skin of trunk, except scrotum   . Unspecified vitamin D deficiency   . Other and unspecified hyperlipidemia   . Tobacco use disorder   . 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 Right bundle branch block   . Other and unspecified disc disorder of lumbar region   . Chronic airway obstruction, not elsewhere classified   . Acute, but ill-defined, cerebrovascular disease ([place])   . Osteoarthrosis, unspecified whether generalized or localized,  unspecified site   . Stiffness of joint, not elsewhere classified, unspecified site   . Myalgia and myositis, unspecified   . Undiagnosed cardiac murmurs   . Urinary frequency   . Long term (current) use of anticoagulants   . Pain in joint, lower leg   . Primary hypercoagulable state ([place])   . Other pulmonary embolism and infarction 1988  . Impacted cerumen 02/23/13    Past Surgical History  Procedure Laterality Date  . Cholecystectomy  1981  . Partial hip arthroplasty Right 06/18/2009    Dr. Delilah Shan  . Pilonidal cyst excision  1968    base of spine  . Colonoscopy  2009    There were no vitals filed for this visit.  Visit Diagnosis:  Weakness of both legs - Plan: PT plan of care cert/re-cert  Abnormality of gait - Plan: PT plan of care cert/re-cert  Difficulty walking - Plan: PT plan of care cert/re-cert      Subjective Assessment - 07/23/15 1500    Subjective Pt presents to PT s/p fall 07/05/15.  He was walking across the grass and hit the concrete sidewalk.  Pt has had 2 falls in the past 6 months.     Patient is accompained by: Family member   Pertinent History Rt THA (2011)   Limitations Walking   How long can you walk comfortably? OK with a cart in the store.  Limited  to 15 minutes without device   Diagnostic tests none   Patient Stated Goals improve gait/balance, lift Rt LE with increased ease.   Currently in Pain? No/denies            [facility] PT Assessment - 07/23/15 0001    Assessment   Medical Diagnosis Fall, Rt hip pain   Referring Provider Sherrie Mustache, NP   Onset Date/Surgical Date 07/05/15   Next MD Visit 07/25/15   Precautions   Precautions None   Restrictions   Weight Bearing Restrictions No   Balance Screen   Has the patient fallen in the past 6 months Yes   How many times? 2   Has the patient had a decrease in activity level because of a fear of falling?  Yes   Is the patient reluctant to leave their home because of a fear of falling?  No    Home Social worker Private residence   Living Arrangements Spouse/significant other   Type of [place] to enter   Entrance Stairs-Number of Steps 1   [place] One level   [place] - single point;Walker - 2 wheels;Grab bars - tub/shower   Prior Function   Level of Independence Independent   Vocation Retired   Leisure none   Cognition   Overall Cognitive Status Within Functional Limits for tasks assessed   Posture/Postural Control   Posture/Postural Control Postural limitations   Posture Comments ER at bil hips   ROM / Strength   AROM / PROM / Strength AROM;Strength   AROM   Overall AROM  Deficits   Overall AROM Comments hip flexibility limited by 25-50% in all directions   Strength   Overall Strength Deficits   Overall Strength Comments Bil knees 4+/5, hip flexion 4/5, DF 4+/5, hip abduction 4/5   Palpation   Palpation comment none   Transfers   Transfers Sit to Stand;Stand to Sit   Sit to Stand 6: Modified independent (Device/Increase time)   Five time sit to stand comments  16 seconds   Stand to Sit 6: Modified independent (Device/Increase time)   Ambulation/Gait   Ambulation/Gait Yes   Ambulation/Gait Assistance 6: Modified independent (Device/Increase time)   Ambulation Distance (Feet) 100 Feet   Gait Pattern Step-through pattern;Right circumduction;Left circumduction;Right hip hike;Left hip hike;Lateral trunk lean to right;Lateral trunk lean to left   Balance   Balance Assessed Yes   Standardized Balance Assessment   Standardized Balance Assessment Timed Up and Go Test   Timed Up and Go Test   TUG Normal TUG   Normal TUG (seconds) 14                           PT Education - 07/23/15 1518    Education provided Yes   Education Details HEP: seated LE strength   Person(s) Educated Patient;Spouse   Methods Handout;Explanation   Comprehension Verbalized understanding;Returned  demonstration          PT Short Term Goals - 07/23/15 1530    PT SHORT TERM GOAL #1   Title be indepedent in initial HEP   Time 4   Period Weeks   Status New   PT SHORT TERM GOAL #2   Title peroform 5x sit to stand in 14 seconds    Time 4   Period Weeks   Status New   PT SHORT TERM GOAL #3   Title demonstrate sit  to stand with moderate UE support   Time 4   Period Weeks   Status New           PT Long Term Goals - 2015-08-05 1531    PT LONG TERM GOAL #1   Title be independent in advanced HEP   Time 8   Period Weeks   Status New   PT LONG TERM GOAL #2   Title perform TUG in < or = to 12 seconds   Time 8   Period Weeks   Status New   PT LONG TERM GOAL #3   Title perform 5x sit to stand in < or = to 12 seconds   Time 8   Period Weeks   Status New   PT LONG TERM GOAL #4   Title improve Rt hip flexor strength to get in/out of car with 50% incresaed ease   Time 8   Period Weeks   Status New               Plan - 08-05-2015 1525    Clinical Impression Statement Pt presents to PT s/p 2 falls with most recent 2.5 weeks ago.  Pt hit his hip on the concrete and had pain at the time.  Pain has now subsided and he has continued balance and mobility deficits.  Pt demonstrates hip stiffness, weaknes, max UE support with sit to stand and difficulty with ambulation.  5x sit to stand is 16 seconds and TUG is 14 seconds.  Pt will benefit from skilled PT for balance and LE strength progression to improve safety and endurance in the community   Pt will benefit from skilled therapeutic intervention in order to improve on the following deficits Decreased strength;Decreased balance;Impaired flexibility;Decreased activity tolerance;Decreased endurance;Abnormal gait   Rehab Potential Good   PT Frequency 2x / week   PT Duration 8 weeks   PT Treatment/Interventions ADLs/Self Care Home Management;Cryotherapy;Moist Heat;Therapeutic exercise;Therapeutic activities;Functional mobility  training;Stair training;Gait training;Neuromuscular re-education;Patient/family education;Manual techniques;Passive range of motion   PT Next Visit Plan LE strength, balance, gait   Consulted and Agree with Plan of Care Patient          G-Codes - 08/05/2015 1525    Functional Assessment Tool Used TUG, 5x sit to stand   Functional Limitation Mobility: Walking and moving around   Mobility: Walking and Moving Around Current Status [phone]) At least 20 percent but less than 40 percent impaired, limited or restricted   Mobility: Walking and Moving Around Goal Status [phone]) At least 20 percent but less than 40 percent impaired, limited or restricted       Problem List Patient Active Problem List   Diagnosis Date Noted  . Palpitations 11/08/2014  . Pain of left arm 11/08/2014  . Pain in joint, lower leg 02/28/2014  . Essential hypertension 02/28/2014  . Impacted cerumen 02/23/2013  . Disturbance of skin sensation 02/23/2013  . Insomnia, unspecified 02/23/2013  . Obesity   . DM type 2 (diabetes mellitus, type 2) ([place])   . Hyperlipemia   . Urinary frequency   . Primary hypercoagulable state ([place]) 01/03/2013  . Other pulmonary embolism and infarction 01/03/2013  . Long term current use of anticoagulant therapy 01/03/2013    TAKACS,KELLY, PT 08/05/15, 3:34 PM   [facility] [address] [place], [place], [address] Phone: [phone]   Fax:  [phone]  Name: RANDALL COLDEN MRN: [id number] Date of Birth: 02-28-1941

## 2015-07-23 NOTE — Patient Instructions (Signed)
KNEE: Extension, Long Arc Quad (Weight)  Place weight around leg. Raise leg until knee is straight. Hold _5__ seconds. Use ___ lb weight. _10__ reps per set (each leg), 4-5__ sets per day, __7_ days per week  Copyright  VHI. All rights reserved.      Knee Raise   Lift knee and then lower it. Repeat with other knee. Repeat _10__ times each leg. Do _4-5___ sessions per day.  http://gt2.exer.us/445   Copyright  VHI. All rights reserved.  Toe Up   Gently rise up on toes and back on heels. Repeat _20___ times. Do 4-5____ sessions per day.  [facility] [address] [place], [place] [address] Phone # [phone] Fax [phone]

## 2015-07-24 LAB — COMPREHENSIVE METABOLIC PANEL
ALT: 23 IU/L (ref 0–44)
AST: 20 IU/L (ref 0–40)
Albumin/Globulin Ratio: 1.5 (ref 1.1–2.5)
Albumin: 4.1 g/dL (ref 3.5–4.8)
Alkaline Phosphatase: 65 IU/L (ref 39–117)
BUN / CREAT RATIO: 22 (ref 10–22)
BUN: 21 mg/dL (ref 8–27)
Bilirubin Total: 0.4 mg/dL (ref 0.0–1.2)
CHLORIDE: 100 mmol/L (ref 97–106)
CO2: 22 mmol/L (ref 18–29)
Calcium: 9.2 mg/dL (ref 8.6–10.2)
Creatinine, Ser: 0.97 mg/dL (ref 0.76–1.27)
GFR calc Af Amer: 89 mL/min/{1.73_m2} (ref >59)
GFR calc non Af Amer: 77 mL/min/{1.73_m2} (ref >59)
GLOBULIN, TOTAL: 2.8 g/dL (ref 1.5–4.5)
Glucose: 116 mg/dL — ABNORMAL HIGH (ref 65–99)
POTASSIUM: 4.9 mmol/L (ref 3.5–5.2)
SODIUM: 139 mmol/L (ref 136–144)
TOTAL PROTEIN: 6.9 g/dL (ref 6.0–8.5)

## 2015-07-24 LAB — LIPID PANEL
CHOL/HDL RATIO: 2.5 ratio (ref 0.0–5.0)
Cholesterol, Total: 112 mg/dL (ref 100–199)
HDL: 45 mg/dL (ref >39)
LDL CALC: 55 mg/dL (ref 0–99)
Triglycerides: 60 mg/dL (ref 0–149)
VLDL Cholesterol Cal: 12 mg/dL (ref 5–40)

## 2015-07-24 LAB — HEMOGLOBIN A1C
Est. average glucose Bld gHb Est-mCnc: 146 mg/dL
Hgb A1c MFr Bld: 6.7 % — ABNORMAL HIGH (ref 4.8–5.6)

## 2015-07-25 ENCOUNTER — Encounter: Payer: Self-pay | Admitting: Internal Medicine

## 2015-07-25 ENCOUNTER — Ambulatory Visit (INDEPENDENT_AMBULATORY_CARE_PROVIDER_SITE_OTHER): Payer: PPO | Admitting: Internal Medicine

## 2015-07-25 VITALS — BP 118/72 | HR 57 | Temp 97.7°F | Ht 65.5 in | Wt 251.0 lb

## 2015-07-25 DIAGNOSIS — E669 Obesity, unspecified: Secondary | ICD-10-CM | POA: Diagnosis not present

## 2015-07-25 DIAGNOSIS — Z9181 History of falling: Secondary | ICD-10-CM

## 2015-07-25 DIAGNOSIS — I10 Essential (primary) hypertension: Secondary | ICD-10-CM | POA: Diagnosis not present

## 2015-07-25 DIAGNOSIS — E119 Type 2 diabetes mellitus without complications: Secondary | ICD-10-CM

## 2015-07-25 DIAGNOSIS — D6859 Other primary thrombophilia: Secondary | ICD-10-CM

## 2015-07-25 DIAGNOSIS — E785 Hyperlipidemia, unspecified: Secondary | ICD-10-CM

## 2015-07-25 DIAGNOSIS — Z7901 Long term (current) use of anticoagulants: Secondary | ICD-10-CM

## 2015-07-25 MED ORDER — LISINOPRIL 20 MG PO TABS: ORAL_TABLET | ORAL | Status: DC

## 2015-07-25 MED ORDER — WARFARIN SODIUM 10 MG PO TABS: ORAL_TABLET | ORAL | Status: DC

## 2015-07-25 MED ORDER — SIMVASTATIN 20 MG PO TABS: ORAL_TABLET | ORAL | Status: DC

## 2015-07-25 MED ORDER — ZOSTER VACCINE LIVE 19400 UNT/0.65ML ~~LOC~~ SOLR: 0.6500 mL | Freq: Once | SUBCUTANEOUS | Status: DC

## 2015-07-25 MED ORDER — WARFARIN SODIUM 5 MG PO TABS: ORAL_TABLET | ORAL | Status: DC

## 2015-07-25 NOTE — Patient Instructions (Signed)
Reminder: Please bring copy of [place]

## 2015-07-25 NOTE — Progress Notes (Signed)
Patient ID: Colton Miranda, male   DOB: Sep 30, 1940, 74 y.o.   MRN: [id number]    Facility  [facility]    Place of Service:   OFFICE    No Known Allergies  Chief Complaint  Patient presents with  . Medical Management of Chronic Issues    4 month follow-up , discuss labs (copy printed). Follow-up on right hip injury.  Positive for fall risk- will start Outpatient PT this Friday  . Immunizations    Refused flu vaccine, RX given for Shingles Vaccine    . Nocturia    Patient with ongoing concerns or frequent urination at night  . Eye Exam Due    Patient will scheduled for himself     HPI:  Type 2 diabetes mellitus without complication, without long-term current use of insulin ([place]) - adequately controlled  Essential hypertension - controlled  Hyperlipemia - controlled on current medication  Obesity - unchanged. We have discussed this on multiple occasions in the past as well as today. Patient likes to cook. He continues to maintain weight. He understands that it would be better if he started losing weight.  Primary hypercoagulable state ([place]) -continues warfarin  Long term current use of anticoagulant therapy - Plan: warfarin (COUMADIN) 10 MG tablet, warfarin (COUMADIN) 5 MG tablet  History of fall - fell about a month ago. Sustained a hematoma of the right hip posteriorly and had significant bruising subsequent to that. Bruising is resolved. He is not tender in the area. There are skin abrasions at the elbows and arms that have healed.    Medications: Patient's Medications  New Prescriptions   No medications on file  Previous Medications   AMBULATORY NON FORMULARY MEDICATION    One Touch Verio Sugar Machine Use as Directed in testing blood sugar Dx:E11.65   AMBULATORY NON FORMULARY MEDICATION    One Touch Verio Test Strips Use to test blood sugar twice daily Dx: E11.65   AMBULATORY NON FORMULARY MEDICATION    One Touch Verio Lancets Use to test blood sugar  Dx:E11.65   CHOLECALCIFEROL (VITAMIN D) 2000 UNITS CAPS    Take 1 capsule by mouth daily.     LISINOPRIL (PRINIVIL,ZESTRIL) 20 MG TABLET    TAKE ONE TABLET BY MOUTH ONCE DAILY FOR BLOOD PRESSURE   METFORMIN (GLUCOPHAGE) 500 MG TABLET    Take 1 tablet every morning to control blood sugar.   MISC NATURAL PRODUCTS (OSTEO BI-FLEX ADV TRIPLE ST PO)    Take by mouth daily.   NYSTATIN (MYCOSTATIN/NYSTOP) 100000 UNIT/GM POWD    Twice daily to affected area   SIMVASTATIN (ZOCOR) 20 MG TABLET    TAKE ONE TABLET BY MOUTH ONCE DAILY   TRAMADOL (ULTRAM) 50 MG TABLET    Take one tablet by mouth up to four times daily for pain   WARFARIN (COUMADIN) 10 MG TABLET    TAKE ONE TABLET BY MOUTH ONCE DAILY   WARFARIN (COUMADIN) 5 MG TABLET    15 mg daily except 17.71m on Mondays, Wednedays  and Fridays: (1) 542mtab, 1 & 1/2 on Mondays/Wed/Fri  (1) 1012mab)  Modified Medications   Modified Medication Previous Medication   ZOSTER VACCINE LIVE, PF, (ZOSTAVAX) 19409381T/0.65ML INJECTION zoster vaccine live, PF, (ZOSTAVAX) 19482993T/0.65ML injection      Inject 19,400 Units into the skin once.    Inject 0.65 mLs into the skin once.  Discontinued Medications   No medications on file    Review of Systems  Constitutional: Negative for  chills, activity change and appetite change.  HENT: Positive for hearing loss and tinnitus. Negative for ear pain and nosebleeds.   Eyes:       Corrective lenses  Respiratory: Negative for chest tightness and shortness of breath.   Cardiovascular: Positive for leg swelling. Negative for chest pain and palpitations.  Gastrointestinal: Negative for blood in stool and anal bleeding.  Endocrine:       Diabetic  Genitourinary: Negative for hematuria.       Nocturia x 4. Some urgency. Normal stream.  Musculoskeletal: Positive for back pain and arthralgias (shoulder pains).  Skin: Negative.   Neurological: Negative for dizziness, tremors, seizures, syncope, facial asymmetry, speech difficulty,  weakness, light-headedness, numbness and headaches.       Previous problems with paresthesias and discomfort in the left arm have not recurred in the last few months.  Hematological: Negative.        History of hypercoagulable state.  Psychiatric/Behavioral:       Problems with insomnia. Wakes at night. He is up at 4 AM.    Filed Vitals:   07/25/15 1147  BP: 118/72  Pulse: 57  Temp: 97.7 F (36.5 C)  TempSrc: Oral  Height: 5' 5.5" (1.664 m)  Weight: 251 lb (113.853 kg)  SpO2: 97%   Body mass index is 41.12 kg/(m^2).  Physical Exam  Constitutional: He is oriented to person, place, and time.  Obese  HENT:  History of cerumen impactions  Eyes:   Corrective lenses  Neck: Normal range of motion. Neck supple. No JVD present. No tracheal deviation present. No thyromegaly present.  Cardiovascular: Normal rate, regular rhythm and normal heart sounds.  Exam reveals no gallop and no friction rub.   No murmur heard. Pulmonary/Chest: Effort normal and breath sounds normal. No respiratory distress. He has no wheezes. He has no rales.  Abdominal: Soft. Bowel sounds are normal. He exhibits no distension and no mass. There is no tenderness.  Musculoskeletal:  Back discomfort to palpation. 3+ bipedal edema. No calf tenderness. Reduced ability to raise left arm at the shoulder. Wobbly gait. Using left knee brace with Velcro closures.  Lymphadenopathy:    He has no cervical adenopathy.  Neurological: He is alert and oriented to person, place, and time. No cranial nerve deficit.  Skin: Skin is warm and dry. No rash noted. No erythema. No pallor.  Psychiatric: He has a normal mood and affect. His behavior is normal. Thought content normal.    Labs reviewed: Lab Summary Latest Ref Rng 07/23/2015 11/08/2014 06/30/2014  Hemoglobin 13.0-17.0 g/dL (None) (None) (None)  Hematocrit 39.0-52.0 % (None) (None) (None)  White count - (None) (None) (None)  Platelet count - (None) (None) (None)  Sodium  136 - 144 mmol/L 139 138 139  Potassium 3.5 - 5.2 mmol/L 4.9 4.7 4.4  Calcium 8.6 - 10.2 mg/dL 9.2 9.9 9.5  Phosphorus - (None) (None) (None)  Creatinine 0.76 - 1.27 mg/dL 0.97 1.01 1.13  AST 0 - 40 IU/L 20 (None) 21  Alk Phos 39 - 117 IU/L 65 (None) 57  Bilirubin 0.0 - 1.2 mg/dL 0.4 (None) 0.4  Glucose 65 - 99 mg/dL 116(H) 97 97  Cholesterol - (None) (None) (None)  HDL cholesterol >39 mg/dL 45 53 (None)  Triglycerides 0 - 149 mg/dL 60 85 (None)  LDL Direct - (None) (None) (None)  LDL Calc 0 - 99 mg/dL 55 57 (None)  Total protein - (None) (None) (None)  Albumin 3.5 - 4.8 g/dL 4.1 (None) 4.2  No results found for: TSH, T3TOTAL, T4TOTAL, THYROIDAB Lab Results  Component Value Date   BUN 21 07/23/2015   Lab Results  Component Value Date   HGBA1C 6.7* 07/23/2015    Assessment/Plan  1. Type 2 diabetes mellitus without complication, without long-term current use of insulin (H[facility]) - Hemoglobin A1c; Future - Basic metabolic panel; Future  2. Essential hypertension - lisinopril (PRINIVIL,ZESTRIL) 20 MG tablet; TAKE ONE TABLET DAILY TO CONTROL BLOOD PRESSURE  Dispense: 90 tablet; Refill: 1 - Basic metabolic panel; Future  3. Hyperlipemia - simvastatin (ZOCOR) 20 MG tablet; One daily to control cholesterol  Dispense: 90 tablet; Refill: 3 - Lipid panel; Future  4. Obesity Encouraged to lose weight  5. Hypercoagulable state ([place]) - warfarin (COUMADIN) 10 MG tablet; One daily for anticoagulation  Dispense: 90 tablet; Refill: 3 - warfarin (COUMADIN) 5 MG tablet; 15 mg daily except 17.36m on Mondays, Wednedays  and Fridays: (1) 559mtab, 1 & 1/2 on Mondays/Wed/Fri  (1) 1052mab) for anticoagulation  Dispense: 120 tablet; Refill: 3  6. Long term current use of anticoagulant therapy - warfarin (COUMADIN) 10 MG tablet; One daily for anticoagulation  Dispense: 90 tablet; Refill: 3 - warfarin (COUMADIN) 5 MG tablet; 15 mg daily except 17.5mg30m Mondays, Wednedays  and Fridays: (1) 5mg 63mb, 1 & 1/2 on Mondays/Wed/Fri  (1) 10mg 25m for anticoagulation  Dispense: 120 tablet; Refill: 3  7. History of fall Sustained hematoma in the right hip that is now resolved.

## 2015-07-26 ENCOUNTER — Telehealth: Payer: Self-pay

## 2015-07-26 NOTE — Telephone Encounter (Signed)
Message was received via fax to clarify which warfarin rx patient needs for 2 rx's were sent with 2 different doses and 2 different strengths.  I called patient to verify , patient's wife states they got this straightened at the pharmacy last night. Patient only needed 10 mg tablets right now and that's all he picked up but he does take both doses.  I called and left message for the pharmacy informing them to hold rx for 5 mg until requested by patient. I instructed the pharmacy to call if they need further clarification.

## 2015-07-27 ENCOUNTER — Ambulatory Visit: Payer: PPO | Attending: Nurse Practitioner | Admitting: Physical Therapy

## 2015-07-27 ENCOUNTER — Encounter: Payer: Self-pay | Admitting: Physical Therapy

## 2015-07-27 DIAGNOSIS — R269 Unspecified abnormalities of gait and mobility: Secondary | ICD-10-CM | POA: Insufficient documentation

## 2015-07-27 DIAGNOSIS — R262 Difficulty in walking, not elsewhere classified: Secondary | ICD-10-CM | POA: Diagnosis present

## 2015-07-27 DIAGNOSIS — R29898 Other symptoms and signs involving the musculoskeletal system: Secondary | ICD-10-CM | POA: Diagnosis present

## 2015-07-27 NOTE — Patient Instructions (Signed)
Single Leg - Eyes Open  Holding support, lift right leg while maintaining balance over other leg. Progress to removing hands from support surface for longer periods of time. Hold_ 10 -20  seconds. Repeat  3  times per session.  Change to opposite leg.   Feet Heel-Toe "Tandem", Stance    ARMS at your side  With eyes open, right foot directly in front of the other,  Keep your arms at the side /not shown in picture Repeat _3___ times per session increase time to 20 sec Repeat on other leg.   Feet Heel-Toe "Tandem"  ARMS at your side     walk a straight line bringing one foot directly in front of the other. Repeat for for 10 to 15 feet. Side-Stepping   Walk to left side with eyes open. Take even steps, leading with same foot. Repeat in other direction. Repeat for _ minutes per session.Do __ per day.    Copyright  VHI. All rights reserved.

## 2015-07-27 NOTE — Therapy (Signed)
[facility] [address], [place], [place], [address] Phone: [phone]   Fax:  [phone]  Physical Therapy Treatment  Patient Details  Name: Colton Miranda MRN: [id number] Date of Birth: Sep 13, 1941 Referring Provider: Sherrie Mustache, NP  Encounter Date: 07/27/2015      PT End of Session - 07/27/15 1120    Visit Number 2   Number of Visits 10   Date for PT Re-Evaluation 09/17/15   PT Start Time 1100   PT Stop Time 1144   PT Time Calculation (min) 44 min   Activity Tolerance Patient tolerated treatment well   Behavior During Therapy [facility] for tasks assessed/performed      Past Medical History  Diagnosis Date  . Diabetes mellitus 10/2010    T2DM, new dx   . Obesity (BMI 30-39.9)   . Hypertension   . Stroke ([place])   . Unspecified constipation   . Pain in limb   . Disturbance of skin sensation   . Memory loss   . Epistaxis   . Type II or unspecified type diabetes mellitus without mention of complication, not stated as uncontrolled   . Hypertrophy of prostate with urinary obstruction and other lower urinary tract symptoms (LUTS)   . Edema   . Epistaxis   . Unspecified late effects of cerebrovascular disease   . Palpitations   . Nonspecific abnormal electrocardiogram (ECG) (EKG)   . Seborrheic dermatitis, unspecified   . Obesity, unspecified   . Type II or unspecified type diabetes mellitus without mention of complication, uncontrolled   . Basal cell carcinoma of skin of trunk, except scrotum   . Unspecified vitamin D deficiency   . Other and unspecified hyperlipidemia   . Tobacco use disorder   . 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 Right bundle branch block   . Other and unspecified disc disorder of lumbar region   . Chronic airway obstruction, not elsewhere classified   . 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 Osteoarthrosis, unspecified whether generalized or localized,  unspecified site   . Stiffness of joint, not elsewhere classified, unspecified site   . Myalgia and myositis, unspecified   . Undiagnosed cardiac murmurs   . Urinary frequency   . Long term (current) use of anticoagulants   . Pain in joint, lower leg   . Primary hypercoagulable state ([place])   . Other pulmonary embolism and infarction 1988  . Impacted cerumen 02/23/13  . History of fall 07/25/2015    Past Surgical History  Procedure Laterality Date  . Cholecystectomy  1981  . Partial hip arthroplasty Right 06/18/2009    Dr. Delilah Shan  . Pilonidal cyst excision  1968    base of spine  . Colonoscopy  2009    There were no vitals filed for this visit.  Visit Diagnosis:  Weakness of both legs  Abnormality of gait  Difficulty walking      Subjective Assessment - 07/27/15 1111    Subjective Pt with difficulties with transfers sit to stand. Pt had a fall 07/05/15.    Pertinent History Pt had to falls past 6 month last was 07/05/15. Rt THA   Currently in Pain? No/denies                         [facility] Adult PT Treatment/Exercise - 07/27/15 0001    Bed Mobility   Bed Mobility --  pt wishes to be called  [name]   Posture/Postural Control   Posture/Postural Control Postural limitations   Posture Comments ER at bil hips   High Level Balance   High Level Balance Activities Other (comment)  Tandem stance x 1 min with close S   Exercises   Exercises Knee/Hip   Knee/Hip Exercises: Aerobic   Nustep L1 67min Seat #7, arms #10    Knee/Hip Exercises: Machines for Strengthening   Cybex Leg Press Seat # 6  70# 3 x10 with B LE, 35# 3 x 10   Knee/Hip Exercises: Standing   SLS practiced  at kitchen sink 3-4 sec at a time   Rebounder 3 directions x 1 min each with B UE support   Other Standing Knee Exercises rockerboard in standing x 3 min with B UE support                 PT Education - 07/27/15 1149    Education provided Yes   Education Details Tandem stance, tandem  walk, side stepping, single leg stance    Person(s) Educated Patient   Methods Handout;Explanation   Comprehension Verbalized understanding;Returned demonstration          PT Short Term Goals - 07/23/15 1530    PT SHORT TERM GOAL #1   Title be indepedent in initial HEP   Time 4   Period Weeks   Status New   PT SHORT TERM GOAL #2   Title peroform 5x sit to stand in 14 seconds    Time 4   Period Weeks   Status New   PT SHORT TERM GOAL #3   Title demonstrate sit to stand with moderate UE support   Time 4   Period Weeks   Status New           PT Long Term Goals - 07/23/15 1531    PT LONG TERM GOAL #1   Title be independent in advanced HEP   Time 8   Period Weeks   Status New   PT LONG TERM GOAL #2   Title perform TUG in < or = to 12 seconds   Time 8   Period Weeks   Status New   PT LONG TERM GOAL #3   Title perform 5x sit to stand in < or = to 12 seconds   Time 8   Period Weeks   Status New   PT LONG TERM GOAL #4   Title improve Rt hip flexor strength to get in/out of car with 50% incresaed ease   Time 8   Period Weeks   Status New               Plan - 07/27/15 1120    Clinical Impression Statement Pt presents to PT after 2 falls. Pt with weakness and low endurance and will benefit from skilled Pt to imporve strength, enduranc eand balance to prevent falling   Pt will benefit from skilled therapeutic intervention in order to improve on the following deficits Decreased strength;Decreased balance;Impaired flexibility;Decreased activity tolerance;Decreased endurance;Abnormal gait   Rehab Potential Good   PT Frequency 2x / week   PT Duration 8 weeks   PT Treatment/Interventions ADLs/Self Care Home Management;Cryotherapy;Moist Heat;Therapeutic exercise;Therapeutic activities;Functional mobility training;Stair training;Gait training;Neuromuscular re-education;Patient/family education;Manual techniques;Passive range of motion   PT Next Visit Plan LE strength,  balance, gait   Consulted and Agree with Plan of Care Patient        Problem List Patient Active Problem List   Diagnosis Date Noted  .  History of fall 07/25/2015  . Palpitations 11/08/2014  . Pain of left arm 11/08/2014  . Pain in joint, lower leg 02/28/2014  . Essential hypertension 02/28/2014  . Impacted cerumen 02/23/2013  . Disturbance of skin sensation 02/23/2013  . Insomnia, unspecified 02/23/2013  . Obesity   . DM type 2 (diabetes mellitus, type 2) ([place])   . Hyperlipemia   . Urinary frequency   . Primary hypercoagulable state ([place]) 01/03/2013  . Other pulmonary embolism and infarction 01/03/2013  . Long term current use of anticoagulant therapy 01/03/2013    NAUMANN-HOUEGNIFIO,Zionna Homewood PTA 07/27/2015, 11:52 AM  [facility] [address] [place], [place], [address] Phone: [phone]   Fax:  [phone]  Name: Colton Miranda MRN: [id number] Date of Birth: March 17, 1941

## 2015-07-28 ENCOUNTER — Other Ambulatory Visit: Payer: Self-pay | Admitting: Internal Medicine

## 2015-07-31 ENCOUNTER — Encounter: Payer: Self-pay | Admitting: Physical Therapy

## 2015-07-31 ENCOUNTER — Ambulatory Visit: Payer: PPO | Admitting: Physical Therapy

## 2015-07-31 DIAGNOSIS — R269 Unspecified abnormalities of gait and mobility: Secondary | ICD-10-CM

## 2015-07-31 DIAGNOSIS — R262 Difficulty in walking, not elsewhere classified: Secondary | ICD-10-CM

## 2015-07-31 DIAGNOSIS — R29898 Other symptoms and signs involving the musculoskeletal system: Secondary | ICD-10-CM | POA: Diagnosis not present

## 2015-07-31 NOTE — Therapy (Signed)
[facility] [address], [place], [place], [address] Phone: [phone]   Fax:  [phone]  Physical Therapy Treatment  Patient Details  Name: Colton Miranda MRN: [id number] Date of Birth: 06/22/1941 Referring Provider: Sherrie Mustache, NP  Encounter Date: 07/31/2015      PT End of Session - 07/31/15 1201    Visit Number 3   Number of Visits 10   Date for PT Re-Evaluation 09/17/15   PT Start Time 1130   PT Stop Time 1215   PT Time Calculation (min) 45 min   Activity Tolerance Patient tolerated treatment well   Behavior During Therapy [facility] for tasks assessed/performed      Past Medical History  Diagnosis Date  . Diabetes mellitus 10/2010    T2DM, new dx   . Obesity (BMI 30-39.9)   . Hypertension   . Stroke ([place])   . Unspecified constipation   . Pain in limb   . Disturbance of skin sensation   . Memory loss   . Epistaxis   . Type II or unspecified type diabetes mellitus without mention of complication, not stated as uncontrolled   . Hypertrophy of prostate with urinary obstruction and other lower urinary tract symptoms (LUTS)   . Edema   . Epistaxis   . Unspecified late effects of cerebrovascular disease   . Palpitations   . Nonspecific abnormal electrocardiogram (ECG) (EKG)   . Seborrheic dermatitis, unspecified   . Obesity, unspecified   . Type II or unspecified type diabetes mellitus without mention of complication, uncontrolled   . Basal cell carcinoma of skin of trunk, except scrotum   . Unspecified vitamin D deficiency   . Other and unspecified hyperlipidemia   . Tobacco use disorder   . 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 Right bundle branch block   . Other and unspecified disc disorder of lumbar region   . Chronic airway obstruction, not elsewhere classified   . 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 Osteoarthrosis, unspecified whether generalized or localized,  unspecified site   . Stiffness of joint, not elsewhere classified, unspecified site   . Myalgia and myositis, unspecified   . Undiagnosed cardiac murmurs   . Urinary frequency   . Long term (current) use of anticoagulants   . Pain in joint, lower leg   . Primary hypercoagulable state ([place])   . Other pulmonary embolism and infarction 1988  . Impacted cerumen 02/23/13  . History of fall 07/25/2015    Past Surgical History  Procedure Laterality Date  . Cholecystectomy  1981  . Partial hip arthroplasty Right 06/18/2009    Dr. Delilah Shan  . Pilonidal cyst excision  1968    base of spine  . Colonoscopy  2009    There were no vitals filed for this visit.  Visit Diagnosis:  Weakness of both legs  Abnormality of gait  Difficulty walking      Subjective Assessment - 07/31/15 1153    Subjective Pt reports his legs felt tired after last session   Currently in Pain? No/denies                         [facility] Adult PT Treatment/Exercise - 07/31/15 0001    Bed Mobility   Bed Mobility --  Pt wishes to be called [name]   Transfers   Transfers Sit to Stand;Stand to Sit   Sit to Stand 7: Independent  Five time sit to stand comments  11   Stand to Sit 7: Independent   Ambulation/Gait   Ambulation/Gait Yes   Ambulation/Gait Assistance 6: Modified independent (Device/Increase time)   Ambulation Distance (Feet) 100 Feet   Gait Pattern Step-through pattern;Right circumduction;Left circumduction;Right hip hike;Left hip hike;Lateral trunk lean to right;Lateral trunk lean to left   Ambulation Surface Level   Gait velocity slow   Gait Comments slighly limping   Posture/Postural Control   Posture/Postural Control Postural limitations   Posture Comments ER at bil hips   High Level Balance   High Level Balance Activities Other (comment)  Tandem stance for 1 min   Exercises   Exercises Knee/Hip   Knee/Hip Exercises: Aerobic   Nustep L1 10 min Seat #7, arms #10    Knee/Hip  Exercises: Machines for Strengthening   Cybex Leg Press Seat # 6  70# 3 x10 with B LE, 35# 3 x 10   Knee/Hip Exercises: Standing   SLS practiced  at kitchen sink 3-4 sec at a time   Rebounder 3 directions x 1 min each with B UE support   Other Standing Knee Exercises rockerboard in standing x 3 min with B UE support    Other Standing Knee Exercises sit to stand x6 in 12 sec                   PT Short Term Goals - 07/31/15 1210    PT SHORT TERM GOAL #1   Title be indepedent in initial HEP   Time 4   Period Weeks   Status On-going   PT SHORT TERM GOAL #2   Title peroform 5x sit to stand in 14 seconds    Time 4   Period Weeks   Status On-going   PT SHORT TERM GOAL #3   Title demonstrate sit to stand with moderate UE support   Time 4   Period Weeks   Status On-going           PT Long Term Goals - 07/31/15 1211    PT LONG TERM GOAL #1   Title be independent in advanced HEP   Time 8   Period Weeks   Status On-going   PT LONG TERM GOAL #2   Title perform TUG in < or = to 12 seconds   Time 8   Period Weeks   Status On-going   PT LONG TERM GOAL #3   Title perform 5x sit to stand in < or = to 12 seconds  6x in 12 sec   Time 8   Period Weeks   Status Achieved   PT LONG TERM GOAL #4   Title improve Rt hip flexor strength to get in/out of car with 50% incresaed ease   Time 8   Period Weeks   Status On-going               Plan - 07/31/15 1209    Clinical Impression Statement Pt shows improvement with SLS up to 6 sec on left today compare to 3sec last week.   Pt will benefit from skilled therapeutic intervention in order to improve on the following deficits Decreased strength;Decreased balance;Impaired flexibility;Decreased activity tolerance;Decreased endurance;Abnormal gait   Rehab Potential Good   PT Frequency 2x / week   PT Duration 8 weeks   PT Treatment/Interventions ADLs/Self Care Home Management;Cryotherapy;Moist Heat;Therapeutic  exercise;Therapeutic activities;Functional mobility training;Stair training;Gait training;Neuromuscular re-education;Patient/family education;Manual techniques;Passive range of motion   PT Next Visit Plan Continue  with strength, balance and gait   PT Home Exercise Plan continue with SLS, and tandemstance   Consulted and Agree with Plan of Care Patient        Problem List Patient Active Problem List   Diagnosis Date Noted  . History of fall 07/25/2015  . Palpitations 11/08/2014  . Pain of left arm 11/08/2014  . Pain in joint, lower leg 02/28/2014  . Essential hypertension 02/28/2014  . Impacted cerumen 02/23/2013  . Disturbance of skin sensation 02/23/2013  . Insomnia, unspecified 02/23/2013  . Obesity   . DM type 2 (diabetes mellitus, type 2) ([place])   . Hyperlipemia   . Urinary frequency   . Primary hypercoagulable state ([place]) 01/03/2013  . Other pulmonary embolism and infarction 01/03/2013  . Long term current use of anticoagulant therapy 01/03/2013    NAUMANN-HOUEGNIFIO,Aunika Kirsten PTA 07/31/2015, 12:27 PM  [facility] [address] [place], [place], [address] Phone: [phone]   Fax:  [phone]  Name: Colton Miranda MRN: [id number] Date of Birth: 09-02-1941

## 2015-08-02 ENCOUNTER — Ambulatory Visit: Payer: PPO | Admitting: Physical Therapy

## 2015-08-02 ENCOUNTER — Encounter: Payer: Self-pay | Admitting: Physical Therapy

## 2015-08-02 DIAGNOSIS — R29898 Other symptoms and signs involving the musculoskeletal system: Secondary | ICD-10-CM

## 2015-08-02 DIAGNOSIS — R269 Unspecified abnormalities of gait and mobility: Secondary | ICD-10-CM

## 2015-08-02 DIAGNOSIS — R262 Difficulty in walking, not elsewhere classified: Secondary | ICD-10-CM

## 2015-08-02 NOTE — Therapy (Signed)
[facility] [address] [place], [place], [address] Phone: [phone]   Fax:  [phone]  Physical Therapy Treatment  Patient Details  Name: Colton Miranda MRN: [id number] Date of Birth: 12-Dec-1940 Referring Provider: Sherrie Mustache, NP  Encounter Date: 08/02/2015    Past Medical History  Diagnosis Date  . Diabetes mellitus 10/2010    T2DM, new dx   . Obesity (BMI 30-39.9)   . Hypertension   . Stroke ([place])   . Unspecified constipation   . Pain in limb   . Disturbance of skin sensation   . Memory loss   . Epistaxis   . Type II or unspecified type diabetes mellitus without mention of complication, not stated as uncontrolled   . Hypertrophy of prostate with urinary obstruction and other lower urinary tract symptoms (LUTS)   . Edema   . Epistaxis   . Unspecified late effects of cerebrovascular disease   . Palpitations   . Nonspecific abnormal electrocardiogram (ECG) (EKG)   . Seborrheic dermatitis, unspecified   . Obesity, unspecified   . Type II or unspecified type diabetes mellitus without mention of complication, uncontrolled   . Basal cell carcinoma of skin of trunk, except scrotum   . Unspecified vitamin D deficiency   . Other and unspecified hyperlipidemia   . Tobacco use disorder   . 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 Right bundle branch block   . Other and unspecified disc disorder of lumbar region   . Chronic airway obstruction, not elsewhere classified   . 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 Osteoarthrosis, unspecified whether generalized or localized, unspecified site   . Stiffness of joint, not elsewhere classified, unspecified site   . Myalgia and myositis, unspecified   . Undiagnosed cardiac murmurs   . Urinary frequency   . Long term (current) use of anticoagulants   . Pain in joint, lower leg   . Primary hypercoagulable state ([place])   . Other pulmonary  embolism and infarction 1988  . Impacted cerumen 02/23/13  . History of fall 07/25/2015    Past Surgical History  Procedure Laterality Date  . Cholecystectomy  1981  . Partial hip arthroplasty Right 06/18/2009    Dr. Delilah Shan  . Pilonidal cyst excision  1968    base of spine  . Colonoscopy  2009    There were no vitals filed for this visit.  Visit Diagnosis:  Weakness of both legs  Abnormality of gait  Difficulty walking      Subjective Assessment - 08/02/15 1201    Subjective Pt reports his legs felt less tired after workout on Tuesday.   Pertinent History Pt had to falls past 6 month last was 07/05/15. Rt THA   Currently in Pain? No/denies                         [facility] Adult PT Treatment/Exercise - 08/02/15 0001    Bed Mobility   Bed Mobility --  Pt wishes to be called [name]   Transfers   Transfers Sit to Stand;Stand to Sit   Sit to Stand 7: Independent   Five time sit to stand comments  11   Stand to Sit 7: Independent   Posture/Postural Control   Posture/Postural Control --   High Level Balance   High Level Balance Activities Other (comment)  Tandem Stance for 59min   Exercises   Exercises Knee/Hip   Knee/Hip  Exercises: Aerobic   Nustep L1 10 min Seat #7, arms #10    Knee/Hip Exercises: Machines for Strengthening   Cybex Leg Press Seat # 6  70# 3 x10 with B LE, 35# 3 x 10   Knee/Hip Exercises: Standing   SLS practiced at stair rails 3 x each leg 3-4 sec at a time  left more challenged   Rebounder 3 directions x 1 min each with B UE support   Other Standing Knee Exercises rockerboard in standing x 3 min with B UE support    Other Standing Knee Exercises sit to stand x6 in 12 sec                   PT Short Term Goals - 07/31/15 1210    PT SHORT TERM GOAL #1   Title be indepedent in initial HEP   Time 4   Period Weeks   Status On-going   PT SHORT TERM GOAL #2   Title peroform 5x sit to stand in 14 seconds    Time 4   Period  Weeks   Status On-going   PT SHORT TERM GOAL #3   Title demonstrate sit to stand with moderate UE support   Time 4   Period Weeks   Status On-going           PT Long Term Goals - 07/31/15 1211    PT LONG TERM GOAL #1   Title be independent in advanced HEP   Time 8   Period Weeks   Status On-going   PT LONG TERM GOAL #2   Title perform TUG in < or = to 12 seconds   Time 8   Period Weeks   Status On-going   PT LONG TERM GOAL #3   Title perform 5x sit to stand in < or = to 12 seconds  6x in 12 sec   Time 8   Period Weeks   Status Achieved   PT LONG TERM GOAL #4   Title improve Rt hip flexor strength to get in/out of car with 50% incresaed ease   Time 8   Period Weeks   Status On-going               Problem List Patient Active Problem List   Diagnosis Date Noted  . History of fall 07/25/2015  . Palpitations 11/08/2014  . Pain of left arm 11/08/2014  . Pain in joint, lower leg 02/28/2014  . Essential hypertension 02/28/2014  . Impacted cerumen 02/23/2013  . Disturbance of skin sensation 02/23/2013  . Insomnia, unspecified 02/23/2013  . Obesity   . DM type 2 (diabetes mellitus, type 2) ([place])   . Hyperlipemia   . Urinary frequency   . Primary hypercoagulable state ([place]) 01/03/2013  . Other pulmonary embolism and infarction 01/03/2013  . Long term current use of anticoagulant therapy 01/03/2013    NAUMANN-HOUEGNIFIO,Keylani Perlstein PTA 08/02/2015, 12:21 PM  [facility] [address] [place], [place], [address] Phone: [phone]   Fax:  [phone]  Name: Colton Miranda MRN: [id number] Date of Birth: 03-12-1941

## 2015-08-06 ENCOUNTER — Ambulatory Visit (INDEPENDENT_AMBULATORY_CARE_PROVIDER_SITE_OTHER): Payer: PPO | Admitting: Pharmacotherapy

## 2015-08-06 ENCOUNTER — Encounter: Payer: Self-pay | Admitting: Pharmacotherapy

## 2015-08-06 ENCOUNTER — Ambulatory Visit: Payer: PPO | Admitting: Pharmacotherapy

## 2015-08-06 VITALS — BP 140/78 | HR 63 | Temp 97.9°F | Resp 14 | Ht 66.0 in | Wt 254.0 lb

## 2015-08-06 DIAGNOSIS — Z7901 Long term (current) use of anticoagulants: Secondary | ICD-10-CM

## 2015-08-06 DIAGNOSIS — D6859 Other primary thrombophilia: Secondary | ICD-10-CM | POA: Diagnosis not present

## 2015-08-06 LAB — POCT INR: INR: 2

## 2015-08-06 NOTE — Progress Notes (Signed)
   Subjective:    Patient ID: Colton Miranda, male    DOB: 08-29-1941, 74 y.o.   MRN: [id number]  HPI  Last INR on 06/25/15 was OK at 2.3 Current Coumadin dose is 15mg  QD except 17.5mg  MWF Denies CP, SOB. Did fall while walking the dog.  Large hematoma on right hip.  No evidence of fracture. Consistent with vitamin K  Average BG:  123mg /dl No hypoglycemia    Review of Systems  Respiratory: Negative for shortness of breath.   Cardiovascular: Negative for chest pain.  Genitourinary: Negative for hematuria.  Hematological: Bruises/bleeds easily.       Objective:   Physical Exam  Constitutional: He is oriented to person, place, and time. He appears well-developed and well-nourished.  HENT:  Right Ear: External ear normal.  Left Ear: External ear normal.  Cardiovascular: Normal rate, regular rhythm and normal heart sounds.   Pulmonary/Chest: Effort normal and breath sounds normal.  Neurological: He is alert and oriented to person, place, and time.  Skin: Skin is warm and dry.  Psychiatric: He has a normal mood and affect. His behavior is normal. Judgment and thought content normal.    INR 2.0 BP: 140/78  HR: 63 Wt: 254lb      Assessment & Plan:  1.  INR at goal 2-3 2.  Continue Coumadin 15mg  QD except 17.5mg  MWF 3.  RTC 6 weeks.

## 2015-08-06 NOTE — Patient Instructions (Signed)
INR 2.0 Continue Coumadin 15mg  daily except 17.5mg  Mondays, Wednesdays, Fridays

## 2015-08-07 ENCOUNTER — Encounter: Payer: Self-pay | Admitting: Physical Therapy

## 2015-08-07 ENCOUNTER — Ambulatory Visit: Payer: PPO | Admitting: Physical Therapy

## 2015-08-07 DIAGNOSIS — R29898 Other symptoms and signs involving the musculoskeletal system: Secondary | ICD-10-CM | POA: Diagnosis not present

## 2015-08-07 DIAGNOSIS — R269 Unspecified abnormalities of gait and mobility: Secondary | ICD-10-CM

## 2015-08-07 DIAGNOSIS — R262 Difficulty in walking, not elsewhere classified: Secondary | ICD-10-CM

## 2015-08-07 NOTE — Therapy (Signed)
[facility] [address] [place], [place], [address] Phone: [phone]   Fax:  [phone]  Physical Therapy Treatment  Patient Details  Name: Colton Miranda MRN: [id number] Date of Birth: Apr 17, 1941 Referring Provider: Sherrie Mustache, NP  Encounter Date: 08/07/2015      PT End of Session - 08/07/15 1216    Visit Number 4   Number of Visits 10   Date for PT Re-Evaluation 09/17/15   PT Start Time 1146   PT Stop Time 1230   PT Time Calculation (min) 44 min   Activity Tolerance Patient tolerated treatment well   Behavior During Therapy [facility] for tasks assessed/performed      Past Medical History  Diagnosis Date  . Diabetes mellitus 10/2010    T2DM, new dx   . Obesity (BMI 30-39.9)   . Hypertension   . Stroke ([place])   . Unspecified constipation   . Pain in limb   . Disturbance of skin sensation   . Memory loss   . Epistaxis   . Type II or unspecified type diabetes mellitus without mention of complication, not stated as uncontrolled   . Hypertrophy of prostate with urinary obstruction and other lower urinary tract symptoms (LUTS)   . Edema   . Epistaxis   . Unspecified late effects of cerebrovascular disease   . Palpitations   . Nonspecific abnormal electrocardiogram (ECG) (EKG)   . Seborrheic dermatitis, unspecified   . Obesity, unspecified   . Type II or unspecified type diabetes mellitus without mention of complication, uncontrolled   . Basal cell carcinoma of skin of trunk, except scrotum   . Unspecified vitamin D deficiency   . Other and unspecified hyperlipidemia   . Tobacco use disorder   . 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 Right bundle branch block   . Other and unspecified disc disorder of lumbar region   . Chronic airway obstruction, not elsewhere classified   . 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 Osteoarthrosis, unspecified whether generalized or localized,  unspecified site   . Stiffness of joint, not elsewhere classified, unspecified site   . Myalgia and myositis, unspecified   . Undiagnosed cardiac murmurs   . Urinary frequency   . Long term (current) use of anticoagulants   . Pain in joint, lower leg   . Primary hypercoagulable state ([place])   . Other pulmonary embolism and infarction 1988  . Impacted cerumen 02/23/13  . History of fall 07/25/2015    Past Surgical History  Procedure Laterality Date  . Cholecystectomy  1981  . Partial hip arthroplasty Right 06/18/2009    Dr. Delilah Shan  . Pilonidal cyst excision  1968    base of spine  . Colonoscopy  2009    There were no vitals filed for this visit.  Visit Diagnosis:  Weakness of both legs  Abnormality of gait  Difficulty walking      Subjective Assessment - 08/07/15 1207    Subjective Pt reports feeling stronger with ambulation, was able to walk in supermarket without shopping chart.   Currently in Pain? No/denies                         [facility] Adult PT Treatment/Exercise - 08/07/15 0001    Bed Mobility   Bed Mobility --  Pt wishes to be called [name]   High Level Balance   High Level Balance Activities Other (comment)  Tandme stance x 1 min with each leg in front   Exercises   Exercises Knee/Hip   Knee/Hip Exercises: Aerobic   Nustep L1 10 min Seat #7, arms #10    Knee/Hip Exercises: Machines for Strengthening   Cybex Leg Press Seat # 6  70# 3 x10 with B LE, 35# 3 x 10   Knee/Hip Exercises: Standing   Forward Step Up Both;1 set;10 reps;Step Height: 6"  with left LE needed 1UE support   SLS practiced at sink 3 x each leg longest hold is 2 sec   Rebounder 3 directions x 1 min each with B UE support   Other Standing Knee Exercises rockerboard in standing x 3 min with B UE support    Other Standing Knee Exercises sit to stand x5 9 sec                   PT Short Term Goals - 08/07/15 1220    PT SHORT TERM GOAL #1   Title be indepedent in  initial HEP   Time 4   Period Weeks   Status Achieved   PT SHORT TERM GOAL #2   Title peroform 5x sit to stand in 14 seconds   9sec as of 11-15/16   Time 4   Period Weeks   Status Achieved   PT SHORT TERM GOAL #3   Title demonstrate sit to stand with moderate UE support   Time 4   Period Weeks   Status Achieved           PT Long Term Goals - 08/07/15 1221    PT LONG TERM GOAL #1   Title be independent in advanced HEP   Time 8   Period Weeks   Status On-going   PT LONG TERM GOAL #2   Title perform TUG in < or = to 12 seconds   Time 8   Period Weeks   Status On-going   PT LONG TERM GOAL #3   Title perform 5x sit to stand in < or = to 12 seconds   Time 8   Period Weeks   Status Achieved   PT LONG TERM GOAL #4   Title improve Rt hip flexor strength to get in/out of car with 50% incresaed ease   Time 8   Period Weeks   Status On-going               Plan - 08/07/15 1218    Clinical Impression Statement Pt with good performance with Tandem stance, but SLS is limited to 2 sec today. Pt will continue to benefit from skilled PT to address weakness and balance   Pt will benefit from skilled therapeutic intervention in order to improve on the following deficits Decreased strength;Decreased balance;Impaired flexibility;Decreased activity tolerance;Decreased endurance;Abnormal gait   Rehab Potential Good   PT Frequency 2x / week   PT Duration 8 weeks   PT Treatment/Interventions ADLs/Self Care Home Management;Cryotherapy;Moist Heat;Therapeutic exercise;Therapeutic activities;Functional mobility training;Stair training;Gait training;Neuromuscular re-education;Patient/family education;Manual techniques;Passive range of motion   PT Next Visit Plan Continue with strength, balance and gait   PT Home Exercise Plan continue with SLS, and tandemstance   Consulted and Agree with Plan of Care Patient        Problem List Patient Active Problem List   Diagnosis Date Noted   . History of fall 07/25/2015  . Palpitations 11/08/2014  . Pain of left arm 11/08/2014  . Pain in joint, lower leg 02/28/2014  . Essential  hypertension 02/28/2014  . Impacted cerumen 02/23/2013  . Disturbance of skin sensation 02/23/2013  . Insomnia, unspecified 02/23/2013  . Obesity   . DM type 2 (diabetes mellitus, type 2) ([place])   . Hyperlipemia   . Urinary frequency   . Primary hypercoagulable state ([place]) 01/03/2013  . Other pulmonary embolism and infarction 01/03/2013  . Long term current use of anticoagulant therapy 01/03/2013    NAUMANN-HOUEGNIFIO,Shad Ledvina PTA 08/07/2015, 12:31 PM  [facility] [address], [place], [place], [address] Phone: [phone]   Fax:  [phone]  Name: Colton Miranda MRN: [id number] Date of Birth: [birth year]

## 2015-08-09 ENCOUNTER — Ambulatory Visit: Payer: PPO

## 2015-08-09 DIAGNOSIS — R262 Difficulty in walking, not elsewhere classified: Secondary | ICD-10-CM

## 2015-08-09 DIAGNOSIS — R269 Unspecified abnormalities of gait and mobility: Secondary | ICD-10-CM

## 2015-08-09 DIAGNOSIS — R29898 Other symptoms and signs involving the musculoskeletal system: Secondary | ICD-10-CM | POA: Diagnosis not present

## 2015-08-09 NOTE — Therapy (Signed)
[facility] [address] [place], [place], [address] Phone: [phone]   Fax:  [phone]  Physical Therapy Treatment  Patient Details  Name: Colton Miranda MRN: [id number] Date of Birth: [birth year] Referring Provider: Sherrie Mustache, NP  Encounter Date: 08/09/2015      PT End of Session - 08/09/15 1214    Visit Number 5   Number of Visits 10   Date for PT Re-Evaluation 09/17/15   PT Start Time 1130   PT Stop Time 1213   PT Time Calculation (min) 43 min   Activity Tolerance Patient tolerated treatment well   Behavior During Therapy [facility] for tasks assessed/performed      Past Medical History  Diagnosis Date  . Diabetes mellitus 10/2010    T2DM, new dx   . Obesity (BMI 30-39.9)   . Hypertension   . Stroke ([place])   . Unspecified constipation   . Pain in limb   . Disturbance of skin sensation   . Memory loss   . Epistaxis   . Type II or unspecified type diabetes mellitus without mention of complication, not stated as uncontrolled   . Hypertrophy of prostate with urinary obstruction and other lower urinary tract symptoms (LUTS)   . Edema   . Epistaxis   . Unspecified late effects of cerebrovascular disease   . Palpitations   . Nonspecific abnormal electrocardiogram (ECG) (EKG)   . Seborrheic dermatitis, unspecified   . Obesity, unspecified   . Type II or unspecified type diabetes mellitus without mention of complication, uncontrolled   . Basal cell carcinoma of skin of trunk, except scrotum   . Unspecified vitamin D deficiency   . Other and unspecified hyperlipidemia   . Tobacco use disorder   . 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 Right bundle branch block   . Other and unspecified disc disorder of lumbar region   . Chronic airway obstruction, not elsewhere classified   . 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 Osteoarthrosis, unspecified whether generalized or localized,  unspecified site   . Stiffness of joint, not elsewhere classified, unspecified site   . Myalgia and myositis, unspecified   . Undiagnosed cardiac murmurs   . Urinary frequency   . Long term (current) use of anticoagulants   . Pain in joint, lower leg   . Primary hypercoagulable state ([place])   . Other pulmonary embolism and infarction 1988  . Impacted cerumen 02/23/13  . History of fall 07/25/2015    Past Surgical History  Procedure Laterality Date  . Cholecystectomy  1981  . Partial hip arthroplasty Right 06/18/2009    Dr. Delilah Shan  . Pilonidal cyst excision  1968    base of spine  . Colonoscopy  2009    There were no vitals filed for this visit.  Visit Diagnosis:  Weakness of both legs  Abnormality of gait  Difficulty walking      Subjective Assessment - 08/09/15 1145    Subjective Pt feeling good today.     Patient Stated Goals improve gait/balance, lift Rt LE with increased ease.   Currently in Pain? No/denies                         [facility] Adult PT Treatment/Exercise - 08/09/15 0001    High Level Balance   High Level Balance Activities Other (comment)  Tandem stance x 1 min with each leg in front  Knee/Hip Exercises: Aerobic   Nustep L1 10 min Seat #7, arms #10    Knee/Hip Exercises: Machines for Strengthening   Cybex Leg Press Seat # 6  70# 3 x10 with B LE, 35# 3 x 10 Rt and Lt   Knee/Hip Exercises: Standing   Hip Abduction Stengthening;Both;2 sets;10 reps   Hip Extension Stengthening;Both;2 sets;10 reps   Forward Step Up Both;10 reps;Step Height: 6";2 sets;Hand Hold: 1   Rebounder 3 directions x 1 min each with B UE support   Other Standing Knee Exercises rockerboard in standing x 3 min with B UE support                   PT Short Term Goals - 08/07/15 1220    PT SHORT TERM GOAL #1   Title be indepedent in initial HEP   Time 4   Period Weeks   Status Achieved   PT SHORT TERM GOAL #2   Title peroform 5x sit to stand in 14  seconds   9sec as of 11-15/16   Time 4   Period Weeks   Status Achieved   PT SHORT TERM GOAL #3   Title demonstrate sit to stand with moderate UE support   Time 4   Period Weeks   Status Achieved           PT Long Term Goals - 08/07/15 1221    PT LONG TERM GOAL #1   Title be independent in advanced HEP   Time 8   Period Weeks   Status On-going   PT LONG TERM GOAL #2   Title perform TUG in < or = to 12 seconds   Time 8   Period Weeks   Status On-going   PT LONG TERM GOAL #3   Title perform 5x sit to stand in < or = to 12 seconds   Time 8   Period Weeks   Status Achieved   PT LONG TERM GOAL #4   Title improve Rt hip flexor strength to get in/out of car with 50% incresaed ease   Time 8   Period Weeks   Status On-going               Plan - 08/09/15 1145    Clinical Impression Statement Pt with limited single leg stance and continued balance deficits. Pt with moderate compliance with HEP.  Pt tolerates all activity in the clinic.  Pt reports improved balance and endurance in the community.  Pt will benefit from skilled PT for strength and balance progression.     Pt will benefit from skilled therapeutic intervention in order to improve on the following deficits Decreased strength;Decreased balance;Impaired flexibility;Decreased activity tolerance;Decreased endurance;Abnormal gait   Rehab Potential Good   PT Frequency 2x / week   PT Duration 8 weeks   PT Treatment/Interventions ADLs/Self Care Home Management;Cryotherapy;Moist Heat;Therapeutic exercise;Therapeutic activities;Functional mobility training;Stair training;Gait training;Neuromuscular re-education;Patient/family education;Manual techniques;Passive range of motion   PT Next Visit Plan Continue with strength, balance and gait   Consulted and Agree with Plan of Care Patient        Problem List Patient Active Problem List   Diagnosis Date Noted  . History of fall 07/25/2015  . Palpitations 11/08/2014   . Pain of left arm 11/08/2014  . Pain in joint, lower leg 02/28/2014  . Essential hypertension 02/28/2014  . Impacted cerumen 02/23/2013  . Disturbance of skin sensation 02/23/2013  . Insomnia, unspecified 02/23/2013  . Obesity   . DM  type 2 (diabetes mellitus, type 2) ([place])   . Hyperlipemia   . Urinary frequency   . Primary hypercoagulable state ([place]) 01/03/2013  . Other pulmonary embolism and infarction 01/03/2013  . Long term current use of anticoagulant therapy 01/03/2013    TAKACS,KELLY, PT 08/09/2015, 12:15 PM  [facility] [address] [place], [place], [address] Phone: [phone]   Fax:  [phone]  Name: Colton Miranda MRN: [id number] Date of Birth: 02/16/1941

## 2015-08-14 ENCOUNTER — Ambulatory Visit: Payer: PPO

## 2015-08-14 DIAGNOSIS — R29898 Other symptoms and signs involving the musculoskeletal system: Secondary | ICD-10-CM

## 2015-08-14 DIAGNOSIS — R269 Unspecified abnormalities of gait and mobility: Secondary | ICD-10-CM

## 2015-08-14 DIAGNOSIS — R262 Difficulty in walking, not elsewhere classified: Secondary | ICD-10-CM

## 2015-08-14 NOTE — Therapy (Signed)
[facility] [address] [place], [place], [address] Phone: [phone]   Fax:  [phone]  Physical Therapy Treatment  Patient Details  Name: Colton Miranda MRN: [id number] Date of Birth: [birth year] Referring Provider: Sherrie Mustache, NP  Encounter Date: 08/14/2015      PT End of Session - 08/14/15 1212    Visit Number 6   Number of Visits 10   Date for PT Re-Evaluation 09/17/15   PT Start Time 1140   PT Stop Time 1213   PT Time Calculation (min) 33 min   Activity Tolerance Patient limited by fatigue  Pt was tired today and said knees hurt   Behavior During Therapy [facility] for tasks assessed/performed      Past Medical History  Diagnosis Date  . Diabetes mellitus 10/2010    T2DM, new dx   . Obesity (BMI 30-39.9)   . Hypertension   . Stroke ([place])   . Unspecified constipation   . Pain in limb   . Disturbance of skin sensation   . Memory loss   . Epistaxis   . Type II or unspecified type diabetes mellitus without mention of complication, not stated as uncontrolled   . Hypertrophy of prostate with urinary obstruction and other lower urinary tract symptoms (LUTS)   . Edema   . Epistaxis   . Unspecified late effects of cerebrovascular disease   . Palpitations   . Nonspecific abnormal electrocardiogram (ECG) (EKG)   . Seborrheic dermatitis, unspecified   . Obesity, unspecified   . Type II or unspecified type diabetes mellitus without mention of complication, uncontrolled   . Basal cell carcinoma of skin of trunk, except scrotum   . Unspecified vitamin D deficiency   . Other and unspecified hyperlipidemia   . Tobacco use disorder   . 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 Right bundle branch block   . Other and unspecified disc disorder of lumbar region   . Chronic airway obstruction, not elsewhere classified   . 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 Osteoarthrosis, unspecified  whether generalized or localized, unspecified site   . Stiffness of joint, not elsewhere classified, unspecified site   . Myalgia and myositis, unspecified   . Undiagnosed cardiac murmurs   . Urinary frequency   . Long term (current) use of anticoagulants   . Pain in joint, lower leg   . Primary hypercoagulable state ([place])   . Other pulmonary embolism and infarction 1988  . Impacted cerumen 02/23/13  . History of fall 07/25/2015    Past Surgical History  Procedure Laterality Date  . Cholecystectomy  1981  . Partial hip arthroplasty Right 06/18/2009    Dr. Delilah Shan  . Pilonidal cyst excision  1968    base of spine  . Colonoscopy  2009    There were no vitals filed for this visit.  Visit Diagnosis:  Weakness of both legs  Abnormality of gait  Difficulty walking      Subjective Assessment - 08/14/15 1144    Subjective Sore from having Thanksgiving celebration at his house over the weekend.   Currently in Pain? No/denies                         [facility] Adult PT Treatment/Exercise - 08/14/15 0001    Knee/Hip Exercises: Machines for Strengthening   Cybex Leg Press Seat # 6  70# 3 x10 with B LE, 35# 3 x  10 Rt and Lt   Knee/Hip Exercises: Standing   Hip Abduction Stengthening;Both;2 sets;10 reps   Hip Extension Stengthening;Both;2 sets;10 reps   Forward Step Up Both;10 reps;Step Height: 6";2 sets;Hand Hold: 1   Rebounder 3 directions x 1 min each with B UE support   Other Standing Knee Exercises rockerboard in standing x 3 min with B UE support                   PT Short Term Goals - 08/07/15 1220    PT SHORT TERM GOAL #1   Title be indepedent in initial HEP   Time 4   Period Weeks   Status Achieved   PT SHORT TERM GOAL #2   Title peroform 5x sit to stand in 14 seconds   9sec as of 11-15/16   Time 4   Period Weeks   Status Achieved   PT SHORT TERM GOAL #3   Title demonstrate sit to stand with moderate UE support   Time 4   Period Weeks    Status Achieved           PT Long Term Goals - 08/14/15 1145    PT LONG TERM GOAL #1   Title be independent in advanced HEP   Time 8   Period Weeks   Status On-going   PT LONG TERM GOAL #4   Title improve Rt hip flexor strength to get in/out of car with 50% incresaed ease   Time 8   Period Weeks   Status On-going  sometimes easier               Plan - 08/14/15 1154    Clinical Impression Statement Pt with limited single leg stance and continued balance deficits.  Pt with moderate compliance with HEP.  Pt is able to tolerate all exercise in the clinic without limitation.  Pt reports improved balance and endurance in the community and some increased ease with getting out of the car (didn't rate this today).  Pt will benefit from skilled PT for strength and balance progression.     Pt will benefit from skilled therapeutic intervention in order to improve on the following deficits Decreased strength;Decreased balance;Impaired flexibility;Decreased activity tolerance;Decreased endurance;Abnormal gait   Rehab Potential Good   PT Frequency 2x / week   PT Duration 8 weeks   PT Treatment/Interventions ADLs/Self Care Home Management;Cryotherapy;Moist Heat;Therapeutic exercise;Therapeutic activities;Functional mobility training;Stair training;Gait training;Neuromuscular re-education;Patient/family education;Manual techniques;Passive range of motion   PT Next Visit Plan Continue with strength, balance and gait   Consulted and Agree with Plan of Care Patient        Problem List Patient Active Problem List   Diagnosis Date Noted  . History of fall 07/25/2015  . Palpitations 11/08/2014  . Pain of left arm 11/08/2014  . Pain in joint, lower leg 02/28/2014  . Essential hypertension 02/28/2014  . Impacted cerumen 02/23/2013  . Disturbance of skin sensation 02/23/2013  . Insomnia, unspecified 02/23/2013  . Obesity   . DM type 2 (diabetes mellitus, type 2) ([place])   . Hyperlipemia    . Urinary frequency   . Primary hypercoagulable state ([place]) 01/03/2013  . Other pulmonary embolism and infarction 01/03/2013  . Long term current use of anticoagulant therapy 01/03/2013    Britanny Marksberry, PT 08/14/2015, 12:21 PM  [facility] [address], [place], [place], [address] Phone: [phone]   Fax:  [phone]  Name: Colton Miranda MRN: [id number] Date  of Birth: [birth year]

## 2015-08-21 ENCOUNTER — Encounter: Payer: Self-pay | Admitting: Physical Therapy

## 2015-08-21 ENCOUNTER — Ambulatory Visit: Payer: PPO | Admitting: Physical Therapy

## 2015-08-21 DIAGNOSIS — R29898 Other symptoms and signs involving the musculoskeletal system: Secondary | ICD-10-CM

## 2015-08-21 DIAGNOSIS — R262 Difficulty in walking, not elsewhere classified: Secondary | ICD-10-CM

## 2015-08-21 DIAGNOSIS — R269 Unspecified abnormalities of gait and mobility: Secondary | ICD-10-CM

## 2015-08-21 NOTE — Therapy (Signed)
[facility] [address] [place], [place], [address] Phone: [phone]   Fax:  [phone]  Physical Therapy Treatment  Patient Details  Name: Colton Miranda MRN: [id number] Date of Birth: [birth year] Referring Provider: Sherrie Mustache, NP  Encounter Date: 08/21/2015      PT End of Session - 08/21/15 1214    Visit Number 7   Number of Visits 10   Date for PT Re-Evaluation 09/17/15   PT Start Time 1130   PT Stop Time 1214   PT Time Calculation (min) 44 min   Activity Tolerance Patient limited by fatigue   Behavior During Therapy [facility] for tasks assessed/performed      Past Medical History  Diagnosis Date  . Diabetes mellitus 10/2010    T2DM, new dx   . Obesity (BMI 30-39.9)   . Hypertension   . Stroke ([place])   . Unspecified constipation   . Pain in limb   . Disturbance of skin sensation   . Memory loss   . Epistaxis   . Type II or unspecified type diabetes mellitus without mention of complication, not stated as uncontrolled   . Hypertrophy of prostate with urinary obstruction and other lower urinary tract symptoms (LUTS)   . Edema   . Epistaxis   . Unspecified late effects of cerebrovascular disease   . Palpitations   . Nonspecific abnormal electrocardiogram (ECG) (EKG)   . Seborrheic dermatitis, unspecified   . Obesity, unspecified   . Type II or unspecified type diabetes mellitus without mention of complication, uncontrolled   . Basal cell carcinoma of skin of trunk, except scrotum   . Unspecified vitamin D deficiency   . Other and unspecified hyperlipidemia   . Tobacco use disorder   . 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 Right bundle branch block   . Other and unspecified disc disorder of lumbar region   . Chronic airway obstruction, not elsewhere classified   . 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 Osteoarthrosis, unspecified whether generalized or localized,  unspecified site   . Stiffness of joint, not elsewhere classified, unspecified site   . Myalgia and myositis, unspecified   . Undiagnosed cardiac murmurs   . Urinary frequency   . Long term (current) use of anticoagulants   . Pain in joint, lower leg   . Primary hypercoagulable state ([place])   . Other pulmonary embolism and infarction 1988  . Impacted cerumen 02/23/13  . History of fall 07/25/2015    Past Surgical History  Procedure Laterality Date  . Cholecystectomy  1981  . Partial hip arthroplasty Right 06/18/2009    Dr. Delilah Shan  . Pilonidal cyst excision  1968    base of spine  . Colonoscopy  2009    There were no vitals filed for this visit.  Visit Diagnosis:  Weakness of both legs  Abnormality of gait  Difficulty walking      Subjective Assessment - 08/21/15 1140    Subjective Continues to complain about soreness due to incr activity level secondary to Thanksgiving activities   Currently in Pain? No/denies                         [facility] Adult PT Treatment/Exercise - 08/21/15 0001    Bed Mobility   Bed Mobility --  Pt wishes to be called [name]   High Level Balance   High Level Balance Activities Other (comment)  Tandem  stance x 60min each leg, Tandemwalk 79feet challenging   Exercises   Exercises Knee/Hip   Knee/Hip Exercises: Aerobic   Nustep L1 12 min Seat #7, arms #10    Knee/Hip Exercises: Machines for Strengthening   Cybex Leg Press Seat # 6  80# 3 x10 with B LE, 40# 3 x 10 Rt and Lt   Knee/Hip Exercises: Standing   Hip Abduction Stengthening;Both;2 sets;10 reps  3# added   Hip Extension Stengthening;Both;2 sets;10 reps  3# added   Forward Step Up Both;2 sets;10 reps;Hand Hold: 2;Step Height: 6"   SLS practiced at sink 3 x each leg longest holdRt LE 6sec, left leg only 2 sec   pt reports may need hip replacement on left hip   Rebounder 3 directions x 1 min each with B UE support   Other Standing Knee Exercises rockerboard in standing x 3  min with B UE support                   PT Short Term Goals - 08/07/15 1220    PT SHORT TERM GOAL #1   Title be indepedent in initial HEP   Time 4   Period Weeks   Status Achieved   PT SHORT TERM GOAL #2   Title peroform 5x sit to stand in 14 seconds   9sec as of 11-15/16   Time 4   Period Weeks   Status Achieved   PT SHORT TERM GOAL #3   Title demonstrate sit to stand with moderate UE support   Time 4   Period Weeks   Status Achieved           PT Long Term Goals - 08/21/15 1218    PT LONG TERM GOAL #1   Title be independent in advanced HEP   Time 8   Period Weeks   Status On-going   PT LONG TERM GOAL #2   Title perform TUG in < or = to 12 seconds   Time 4   Period Weeks   Status On-going   PT LONG TERM GOAL #3   Title perform 5x sit to stand in < or = to 12 seconds   Time 8   Period Weeks   Status Achieved   PT LONG TERM GOAL #4   Title improve Rt hip flexor strength to get in/out of car with 50% incresaed ease   Time 8   Period Weeks   Status On-going               Plan - 08/21/15 1215    Clinical Impression Statement Pt with decreased strength and limited SLS on left. Pt was able to increase the time on SLS on right up to 6 sec. Pt will continue to benefit from skilled Pt for strength and balance progression.    Pt will benefit from skilled therapeutic intervention in order to improve on the following deficits Decreased strength;Decreased balance;Impaired flexibility;Decreased activity tolerance;Decreased endurance;Abnormal gait   Rehab Potential Good   PT Frequency 2x / week   PT Duration 8 weeks   PT Treatment/Interventions ADLs/Self Care Home Management;Cryotherapy;Moist Heat;Therapeutic exercise;Therapeutic activities;Functional mobility training;Stair training;Gait training;Neuromuscular re-education;Patient/family education;Manual techniques;Passive range of motion   PT Next Visit Plan Continue with strength, balance and gait   PT  Home Exercise Plan continue with SLS, and tandemstance   Consulted and Agree with Plan of Care Patient        Problem List Patient Active Problem List   Diagnosis Date Noted  .  History of fall 07/25/2015  . Palpitations 11/08/2014  . Pain of left arm 11/08/2014  . Pain in joint, lower leg 02/28/2014  . Essential hypertension 02/28/2014  . Impacted cerumen 02/23/2013  . Disturbance of skin sensation 02/23/2013  . Insomnia, unspecified 02/23/2013  . Obesity   . DM type 2 (diabetes mellitus, type 2) ([place])   . Hyperlipemia   . Urinary frequency   . Primary hypercoagulable state ([place]) 01/03/2013  . Other pulmonary embolism and infarction 01/03/2013  . Long term current use of anticoagulant therapy 01/03/2013    NAUMANN-HOUEGNIFIO,Sharon Rubis PTA 08/21/2015, 12:20 PM  [facility] [address], [place], [place], [address] Phone: [phone]   Fax:  [phone]  Name: DEATON BYNES MRN: [id number] Date of Birth: [birth year]

## 2015-08-23 ENCOUNTER — Ambulatory Visit: Payer: PPO | Attending: Nurse Practitioner | Admitting: Physical Therapy

## 2015-08-23 ENCOUNTER — Encounter: Payer: Self-pay | Admitting: Physical Therapy

## 2015-08-23 DIAGNOSIS — R269 Unspecified abnormalities of gait and mobility: Secondary | ICD-10-CM | POA: Diagnosis present

## 2015-08-23 DIAGNOSIS — R262 Difficulty in walking, not elsewhere classified: Secondary | ICD-10-CM | POA: Diagnosis present

## 2015-08-23 DIAGNOSIS — R29898 Other symptoms and signs involving the musculoskeletal system: Secondary | ICD-10-CM

## 2015-08-23 NOTE — Therapy (Signed)
[facility] [address] [place], [place], [address] Phone: [phone]   Fax:  [phone]  Physical Therapy Treatment  Patient Details  Name: Colton Miranda MRN: [id number] Date of Birth: 03-07-1941 Referring Provider: Sherrie Mustache, NP  Encounter Date: 08/23/2015      PT End of Session - 08/23/15 1206    Visit Number 8   Number of Visits 10   Date for PT Re-Evaluation 09/17/15   PT Start Time [id number]   PT Stop Time 1224   PT Time Calculation (min) 48 min   Activity Tolerance Patient limited by fatigue   Behavior During Therapy [facility] for tasks assessed/performed      Past Medical History  Diagnosis Date  . Diabetes mellitus 10/2010    T2DM, new dx   . Obesity (BMI 30-39.9)   . Hypertension   . Stroke ([place])   . Unspecified constipation   . Pain in limb   . Disturbance of skin sensation   . Memory loss   . Epistaxis   . Type II or unspecified type diabetes mellitus without mention of complication, not stated as uncontrolled   . Hypertrophy of prostate with urinary obstruction and other lower urinary tract symptoms (LUTS)   . Edema   . Epistaxis   . Unspecified late effects of cerebrovascular disease   . Palpitations   . Nonspecific abnormal electrocardiogram (ECG) (EKG)   . Seborrheic dermatitis, unspecified   . Obesity, unspecified   . Type II or unspecified type diabetes mellitus without mention of complication, uncontrolled   . Basal cell carcinoma of skin of trunk, except scrotum   . Unspecified vitamin D deficiency   . Other and unspecified hyperlipidemia   . Tobacco use disorder   . 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 Right bundle branch block   . Other and unspecified disc disorder of lumbar region   . Chronic airway obstruction, not elsewhere classified   . 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)   . Osteoarthrosis, unspecified whether generalized or localized,  unspecified site   . Stiffness of joint, not elsewhere classified, unspecified site   . Myalgia and myositis, unspecified   . Undiagnosed cardiac murmurs   . Urinary frequency   . Long term (current) use of anticoagulants   . Pain in joint, lower leg   . Primary hypercoagulable state ([place])   . Other pulmonary embolism and infarction 1988  . Impacted cerumen 02/23/13  . History of fall 07/25/2015    Past Surgical History  Procedure Laterality Date  . Cholecystectomy  1981  . Partial hip arthroplasty Right 06/18/2009    Dr. Delilah Shan  . Pilonidal cyst excision  1968    base of spine  . Colonoscopy  2009    There were no vitals filed for this visit.  Visit Diagnosis:  Weakness of both legs  Difficulty walking  Abnormality of gait      Subjective Assessment - 08/23/15 1201    Subjective Pt reports feeling improvement with his walking and walking distance increased   Currently in Pain? No/denies                         [facility] Adult PT Treatment/Exercise - 08/23/15 0001    Bed Mobility   Bed Mobility --  Pt wishes to be called [name]   High Level Balance   High Level Balance Activities Other (comment)  Tandem stance x 59min  each leg, Tandemwalk 28 feet   Exercises   Exercises Knee/Hip   Knee/Hip Exercises: Aerobic   Nustep L1 12 min Seat #7, arms #10    Knee/Hip Exercises: Machines for Strengthening   Cybex Leg Press Seat # 6  80# 3 x10 with B LE, 40# 3 x 10 Rt and Lt   Knee/Hip Exercises: Standing   Hip Abduction Stengthening;Both;2 sets;10 reps  3# added   Hip Extension Stengthening;Both;2 sets;10 reps  3#added   Forward Step Up Both;2 sets;10 reps;Hand Hold: 2;Step Height: 6"   SLS practiced at sink 3 x each leg longest holdRt LE 6sec, left leg only 2 sec    Rebounder 3 directions x 1 min each with B UE support   Other Standing Knee Exercises rockerboard in standing x 3 min with Rt  UE support                   PT Short Term Goals -  08/07/15 1220    PT SHORT TERM GOAL #1   Title be indepedent in initial HEP   Time 4   Period Weeks   Status Achieved   PT SHORT TERM GOAL #2   Title peroform 5x sit to stand in 14 seconds   9sec as of 11-15/16   Time 4   Period Weeks   Status Achieved   PT SHORT TERM GOAL #3   Title demonstrate sit to stand with moderate UE support   Time 4   Period Weeks   Status Achieved           PT Long Term Goals - 08/21/15 1218    PT LONG TERM GOAL #1   Title be independent in advanced HEP   Time 8   Period Weeks   Status On-going   PT LONG TERM GOAL #2   Title perform TUG in < or = to 12 seconds   Time 4   Period Weeks   Status On-going   PT LONG TERM GOAL #3   Title perform 5x sit to stand in < or = to 12 seconds   Time 8   Period Weeks   Status Achieved   PT LONG TERM GOAL #4   Title improve Rt hip flexor strength to get in/out of car with 50% incresaed ease   Time 8   Period Weeks   Status On-going               Plan - 08/23/15 1207    Clinical Impression Statement Pt with decreased strength as of now not able to increase weight on leg press also may be limited due to knee pain, may needs knee replacement in the future. pt will continue to benefit from skilled PT for strength and balance progression   Pt will benefit from skilled therapeutic intervention in order to improve on the following deficits Decreased strength;Decreased balance;Impaired flexibility;Decreased activity tolerance;Decreased endurance;Abnormal gait   Rehab Potential Good   PT Frequency 2x / week   PT Duration 8 weeks   PT Treatment/Interventions ADLs/Self Care Home Management;Cryotherapy;Moist Heat;Therapeutic exercise;Therapeutic activities;Functional mobility training;Stair training;Gait training;Neuromuscular re-education;Patient/family education;Manual techniques;Passive range of motion   PT Home Exercise Plan continue with SLS, and tandemstance   Consulted and Agree with Plan of Care  Patient        Problem List Patient Active Problem List   Diagnosis Date Noted  . History of fall 07/25/2015  . Palpitations 11/08/2014  . Pain of left arm 11/08/2014  . Pain  in joint, lower leg 02/28/2014  . Essential hypertension 02/28/2014  . Impacted cerumen 02/23/2013  . Disturbance of skin sensation 02/23/2013  . Insomnia, unspecified 02/23/2013  . Obesity   . DM type 2 (diabetes mellitus, type 2) ([place])   . Hyperlipemia   . Urinary frequency   . Primary hypercoagulable state ([place]) 01/03/2013  . Other pulmonary embolism and infarction 01/03/2013  . Long term current use of anticoagulant therapy 01/03/2013    NAUMANN-HOUEGNIFIO,Elexa Kivi PTA 08/23/2015, 12:25 PM  [facility] [address], [place], [place], [address] Phone: [phone]   Fax:  [phone]  Name: HEMANT LAFON MRN: [id number] Date of Birth: 1941/08/20

## 2015-08-28 ENCOUNTER — Ambulatory Visit: Payer: PPO

## 2015-08-28 DIAGNOSIS — R29898 Other symptoms and signs involving the musculoskeletal system: Secondary | ICD-10-CM

## 2015-08-28 DIAGNOSIS — R262 Difficulty in walking, not elsewhere classified: Secondary | ICD-10-CM

## 2015-08-28 DIAGNOSIS — R269 Unspecified abnormalities of gait and mobility: Secondary | ICD-10-CM

## 2015-08-28 NOTE — Therapy (Signed)
[facility] [address] [place], [place], [address] Phone: [phone]   Fax:  [phone]  Physical Therapy Treatment  Patient Details  Name: Colton Miranda MRN: [id number] Date of Birth: Nov 13, 1940 Referring Provider: Sherrie Mustache, NP  Encounter Date: 08/28/2015      PT End of Session - 08/28/15 1207    Visit Number 9   PT Start Time 5102   PT Stop Time 1210   PT Time Calculation (min) 32 min   Activity Tolerance Patient tolerated treatment well   Behavior During Therapy [facility] for tasks assessed/performed      Past Medical History  Diagnosis Date  . Diabetes mellitus 10/2010    T2DM, new dx   . Obesity (BMI 30-39.9)   . Hypertension   . Stroke ([place])   . Unspecified constipation   . Pain in limb   . Disturbance of skin sensation   . Memory loss   . Epistaxis   . Type II or unspecified type diabetes mellitus without mention of complication, not stated as uncontrolled   . Hypertrophy of prostate with urinary obstruction and other lower urinary tract symptoms (LUTS)   . Edema   . Epistaxis   . Unspecified late effects of cerebrovascular disease   . Palpitations   . Nonspecific abnormal electrocardiogram (ECG) (EKG)   . Seborrheic dermatitis, unspecified   . Obesity, unspecified   . Type II or unspecified type diabetes mellitus without mention of complication, uncontrolled   . Basal cell carcinoma of skin of trunk, except scrotum   . Unspecified vitamin D deficiency   . Other and unspecified hyperlipidemia   . Tobacco use disorder   . 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 Right bundle branch block   . Other and unspecified disc disorder of lumbar region   . Chronic airway obstruction, not elsewhere classified   . 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 Osteoarthrosis, unspecified whether generalized or localized, unspecified site   . Stiffness of joint, not elsewhere  classified, unspecified site   . Myalgia and myositis, unspecified   . Undiagnosed cardiac murmurs   . Urinary frequency   . Long term (current) use of anticoagulants   . Pain in joint, lower leg   . Primary hypercoagulable state ([place])   . Other pulmonary embolism and infarction 1988  . Impacted cerumen 02/23/13  . History of fall 07/25/2015    Past Surgical History  Procedure Laterality Date  . Cholecystectomy  1981  . Partial hip arthroplasty Right 06/18/2009    Dr. Delilah Shan  . Pilonidal cyst excision  1968    base of spine  . Colonoscopy  2009    There were no vitals filed for this visit.  Visit Diagnosis:  Weakness of both legs  Difficulty walking  Abnormality of gait          [facility] PT Assessment - 08/28/15 0001    Assessment   Medical Diagnosis Fall, Rt hip pain   Onset Date/Surgical Date 07/05/15   Prior Function   Level of Independence Independent   Vocation Retired                     [employer] Adult PT Treatment/Exercise - 08/28/15 0001    Knee/Hip Exercises: Aerobic   Nustep L1 12 min Seat #7, arms #10    Knee/Hip Exercises: Machines for Strengthening   Cybex Leg Press Seat # 6  80# 3 x10  with B LE, 40# 3 x 10 Rt and Lt   Knee/Hip Exercises: Standing   Hip Abduction Stengthening;Both;2 sets;10 reps  3# added   Hip Extension Stengthening;Both;2 sets;10 reps  3#added   Forward Step Up Both;2 sets;10 reps;Hand Hold: 2;Step Height: 6"   Rebounder 3 directions x 1 min each with B UE support   Other Standing Knee Exercises rockerboard in standing x 3 min with Rt  UE support                   PT Short Term Goals - 08/07/15 1220    PT SHORT TERM GOAL #1   Title be indepedent in initial HEP   Time 4   Period Weeks   Status Achieved   PT SHORT TERM GOAL #2   Title peroform 5x sit to stand in 14 seconds   9sec as of 11-15/16   Time 4   Period Weeks   Status Achieved   PT SHORT TERM GOAL #3   Title demonstrate sit to stand with  moderate UE support   Time 4   Period Weeks   Status Achieved           PT Long Term Goals - Aug 30, 2015 1145    PT LONG TERM GOAL #1   Title be independent in advanced HEP   Status Achieved   PT LONG TERM GOAL #2   Title perform TUG in < or = to 12 seconds   Status Achieved   PT LONG TERM GOAL #3   Title perform 5x sit to stand in < or = to 12 seconds   Status Achieved   PT LONG TERM GOAL #4   Title improve Rt hip flexor strength to get in/out of car with 50% incresaed ease   Status Partially Met  pt has a low car and this remains difficult               Plan - 2015-08-30 1154    Clinical Impression Statement Pt has met goals for sit to stand and TUG and has comprehensive HEP in place for strength.  Pt reports continued difficulty getting out of car as his car is very low.  Pt reports that his legs feel overall stronger.  Pt will be discharged to HEP for continued expected gains.     PT Next Visit Plan D/C PT to HEP   Consulted and Agree with Plan of Care Patient          G-Codes - 30-Aug-2015 1154    Functional Assessment Tool Used TUG, 5x sit to stand   Functional Limitation Mobility: Walking and moving around   Mobility: Walking and Moving Around Goal Status [phone]) At least 20 percent but less than 40 percent impaired, limited or restricted   Mobility: Walking and Moving Around Discharge Status [phone]) At least 20 percent but less than 40 percent impaired, limited or restricted      Problem List Patient Active Problem List   Diagnosis Date Noted  . History of fall 07/25/2015  . Palpitations 11/08/2014  . Pain of left arm 11/08/2014  . Pain in joint, lower leg 02/28/2014  . Essential hypertension 02/28/2014  . Impacted cerumen 02/23/2013  . Disturbance of skin sensation 02/23/2013  . Insomnia, unspecified 02/23/2013  . Obesity   . DM type 2 (diabetes mellitus, type 2) ([place])   . Hyperlipemia   . Urinary frequency   . Primary hypercoagulable state ([place])  01/03/2013  . Other pulmonary embolism  and infarction 01/03/2013  . Long term current use of anticoagulant therapy 01/03/2013  PHYSICAL THERAPY DISCHARGE SUMMARY  Visits from Start of Care: 10 Current functional level related to goals / functional outcomes: See above for goal status and objective measures  Remaining deficits: Pt with continued difficulty with getting out of low car due to LE weakness.  Pt has HEP in place and will continue for expected strength gains.     Education / Equipment: HEP Plan: Patient agrees to discharge.  Patient goals were partially met. Patient is being discharged due to being pleased with the current functional level.  ?????      Lafonda Patron, PT 08/28/2015, 12:09 PM  [facility] [address] [place], [place], [address] Phone: [phone]   Fax:  [phone]  Name: Colton Miranda MRN: [id number] Date of Birth: [birth year]

## 2015-09-06 ENCOUNTER — Other Ambulatory Visit: Payer: Self-pay | Admitting: Internal Medicine

## 2015-09-10 ENCOUNTER — Ambulatory Visit (INDEPENDENT_AMBULATORY_CARE_PROVIDER_SITE_OTHER): Payer: PPO | Admitting: Pharmacotherapy

## 2015-09-10 ENCOUNTER — Encounter: Payer: Self-pay | Admitting: Pharmacotherapy

## 2015-09-10 VITALS — BP 146/88 | HR 61 | Temp 98.4°F | Resp 20 | Ht 66.0 in | Wt 259.0 lb

## 2015-09-10 DIAGNOSIS — Z7901 Long term (current) use of anticoagulants: Secondary | ICD-10-CM | POA: Diagnosis not present

## 2015-09-10 DIAGNOSIS — D6859 Other primary thrombophilia: Secondary | ICD-10-CM

## 2015-09-10 LAB — POCT INR: INR: 2.6

## 2015-09-10 NOTE — Progress Notes (Signed)
   Subjective:    Patient ID: Colton Miranda, male    DOB: 10/14/1940, 74 y.o.   MRN: [id number]  HPI Last INR was OK at 2.0 Current Coumadin dose is 15mg  QD except 17.5mg  MWF Denies missed doses Denies unusual bleeding or bruising Denies CP, falls Consistent with vitamin K intake.  Average BG: 116mg /dl No hypoglycemia   Review of Systems  HENT: Negative for nosebleeds.   Respiratory: Negative for shortness of breath.   Cardiovascular: Negative for chest pain.  Genitourinary: Negative for hematuria.  Hematological: Does not bruise/bleed easily.       Objective:   Physical Exam  Constitutional: He is oriented to person, place, and time. He appears well-developed and well-nourished.  HENT:  Right Ear: External ear normal.  Left Ear: External ear normal.  Cardiovascular: Normal rate, regular rhythm and normal heart sounds.   Pulmonary/Chest: Effort normal and breath sounds normal.  Neurological: He is alert and oriented to person, place, and time.  Skin: Skin is warm and dry.  Psychiatric: He has a normal mood and affect. His behavior is normal. Judgment and thought content normal.  Vitals reviewed.  BP:  146/88  HR:  61  Wt:  259lb INR 2.6       Assessment & Plan:  1.  INR at goal 2-3 2.  Continue Coumadin 15mg  QD except 17.5mg  MWF 3.  INR 1 month

## 2015-09-10 NOTE — Patient Instructions (Signed)
INR 2.6 Continue Coumadin 15mg  daily except 17.5mg  on Mondays, Wednesdays, Fridays

## 2015-10-02 DIAGNOSIS — Z85828 Personal history of other malignant neoplasm of skin: Secondary | ICD-10-CM | POA: Diagnosis not present

## 2015-10-02 DIAGNOSIS — D1801 Hemangioma of skin and subcutaneous tissue: Secondary | ICD-10-CM | POA: Diagnosis not present

## 2015-10-02 DIAGNOSIS — L821 Other seborrheic keratosis: Secondary | ICD-10-CM | POA: Diagnosis not present

## 2015-10-14 ENCOUNTER — Other Ambulatory Visit: Payer: Self-pay | Admitting: Nurse Practitioner

## 2015-10-22 ENCOUNTER — Encounter: Payer: Self-pay | Admitting: Pharmacotherapy

## 2015-10-22 ENCOUNTER — Ambulatory Visit (INDEPENDENT_AMBULATORY_CARE_PROVIDER_SITE_OTHER): Payer: PPO | Admitting: Pharmacotherapy

## 2015-10-22 VITALS — BP 112/72 | HR 58 | Temp 97.7°F | Resp 20 | Ht 66.0 in | Wt 255.0 lb

## 2015-10-22 DIAGNOSIS — E669 Obesity, unspecified: Secondary | ICD-10-CM

## 2015-10-22 DIAGNOSIS — Z7901 Long term (current) use of anticoagulants: Secondary | ICD-10-CM

## 2015-10-22 DIAGNOSIS — D6859 Other primary thrombophilia: Secondary | ICD-10-CM | POA: Diagnosis not present

## 2015-10-22 DIAGNOSIS — E1169 Type 2 diabetes mellitus with other specified complication: Secondary | ICD-10-CM

## 2015-10-22 DIAGNOSIS — E119 Type 2 diabetes mellitus without complications: Secondary | ICD-10-CM

## 2015-10-22 LAB — POCT INR: INR: 2.4

## 2015-10-22 MED ORDER — AMBULATORY NON FORMULARY MEDICATION: Status: DC

## 2015-10-22 NOTE — Progress Notes (Signed)
   Subjective:    Patient ID: Colton Miranda, male    DOB: Feb 01, 1941, 75 y.o.   MRN: [id number]  HPI Last INR on 09/10/15 was OK at 2.6 Current Coumadin dose is 15mg  QD except 17.5mg  MWF Denies missed doses. Denies unusual bleeding or bruising. Denies CP, falls Consistent with vitamin K intake.   Review of Systems  HENT: Negative for nosebleeds.   Respiratory: Negative for shortness of breath.   Cardiovascular: Negative for chest pain.  Genitourinary: Negative for hematuria.  Hematological: Does not bruise/bleed easily.       Objective:   Physical Exam  Constitutional: He is oriented to person, place, and time. He appears well-developed and well-nourished.  HENT:  Right Ear: External ear normal.  Left Ear: External ear normal.  Cardiovascular: Normal rate, regular rhythm and normal heart sounds.   Pulmonary/Chest: Effort normal and breath sounds normal.  Neurological: He is alert and oriented to person, place, and time.  Skin: Skin is warm and dry.  Psychiatric: He has a normal mood and affect. His behavior is normal. Judgment and thought content normal.  Vitals reviewed.   INR 2.4 BP: 112/72  HR:  58  Wt:  255lb Average BG: 120mg /dl without hypoglycemia      Assessment & Plan:  1.  INR at goal 2-3 2.  Continue Coumadin 15mg  QD except 17.5mg  MWF 3.  RTC in 6 weeks

## 2015-10-22 NOTE — Patient Instructions (Signed)
INR 2.4 Continue Coumadin 15 mg daily except 17.5mg  on Mondays, Wednesdays, and Fridays

## 2015-12-03 ENCOUNTER — Encounter: Payer: Self-pay | Admitting: Pharmacotherapy

## 2015-12-03 ENCOUNTER — Ambulatory Visit (INDEPENDENT_AMBULATORY_CARE_PROVIDER_SITE_OTHER): Payer: PPO | Admitting: Pharmacotherapy

## 2015-12-03 VITALS — BP 130/78 | HR 59 | Temp 97.7°F | Resp 20 | Ht 66.0 in | Wt 258.2 lb

## 2015-12-03 DIAGNOSIS — B372 Candidiasis of skin and nail: Secondary | ICD-10-CM | POA: Diagnosis not present

## 2015-12-03 DIAGNOSIS — Z7901 Long term (current) use of anticoagulants: Secondary | ICD-10-CM

## 2015-12-03 DIAGNOSIS — D6859 Other primary thrombophilia: Secondary | ICD-10-CM | POA: Diagnosis not present

## 2015-12-03 LAB — POCT INR: INR: 3.1

## 2015-12-03 MED ORDER — NYSTATIN 100000 UNIT/GM EX POWD: CUTANEOUS | Status: DC

## 2015-12-03 NOTE — Patient Instructions (Signed)
INR 3.1 Continue Coumadin 72m daily except 17.570mon Mondays, Wednesdays, and Fridays

## 2015-12-03 NOTE — Progress Notes (Signed)
   Subjective:    Patient ID: Colton Miranda, male    DOB: [birth year], 75 y.o.   MRN: [id number]  HPI Last INR on 10/22/15 was OK at 2.4 Current coumadin dose is 15mg  QD except 17.5mg  MWF. Denies missed doses. Denies CP, SOB, falls Denies unusual bleeding or bruising Has been eating less vitamin K foods.  Average BG:  124mg /dl No hypoglycemia Few highs related to eating sweets.   Review of Systems  HENT: Negative for nosebleeds.   Respiratory: Negative for shortness of breath.   Cardiovascular: Negative for chest pain.  Gastrointestinal: Negative for blood in stool and anal bleeding.  Genitourinary: Negative for hematuria.  Hematological: Does not bruise/bleed easily.       Objective:   Physical Exam  Constitutional: He is oriented to person, place, and time. He appears well-developed and well-nourished.  HENT:  Right Ear: External ear normal.  Left Ear: External ear normal.  Cardiovascular: Normal rate, regular rhythm and normal heart sounds.   Pulmonary/Chest: Effort normal and breath sounds normal.  Neurological: He is alert and oriented to person, place, and time.  Skin: Skin is warm and dry.  Psychiatric: He has a normal mood and affect. His behavior is normal. Judgment and thought content normal.  Vitals reviewed.    BP:  130/78  HR: 59  Wt: 258lb INR 3.1     Assessment & Plan:  1.  INR slightly above goal 2-3 due to decreased vitamin K intake. 2.  Continue Coumadin 15mg  QD except 17.5mg  MWF 3.  DM control is excellent. 4.  RTC in 6 weeks.

## 2015-12-14 ENCOUNTER — Other Ambulatory Visit: Payer: Self-pay | Admitting: Internal Medicine

## 2015-12-19 ENCOUNTER — Other Ambulatory Visit: Payer: Self-pay | Admitting: Internal Medicine

## 2016-01-11 ENCOUNTER — Telehealth: Payer: Self-pay | Admitting: Internal Medicine

## 2016-01-11 NOTE — Telephone Encounter (Signed)
Spoke with Mr and Mrs Abate regard the yearly eye exam.  Mr. Lippi stated he will going to the eye doctor in June 2017, says he will call for an appointment. cdavis

## 2016-01-14 ENCOUNTER — Ambulatory Visit: Payer: PPO | Admitting: Pharmacotherapy

## 2016-01-21 ENCOUNTER — Encounter: Payer: Self-pay | Admitting: Pharmacotherapy

## 2016-01-21 ENCOUNTER — Ambulatory Visit (INDEPENDENT_AMBULATORY_CARE_PROVIDER_SITE_OTHER): Payer: PPO | Admitting: Pharmacotherapy

## 2016-01-21 VITALS — BP 138/80 | HR 54 | Temp 97.8°F | Resp 20 | Ht 66.0 in | Wt 259.0 lb

## 2016-01-21 DIAGNOSIS — Z7901 Long term (current) use of anticoagulants: Secondary | ICD-10-CM | POA: Diagnosis not present

## 2016-01-21 DIAGNOSIS — E119 Type 2 diabetes mellitus without complications: Secondary | ICD-10-CM

## 2016-01-21 DIAGNOSIS — E1169 Type 2 diabetes mellitus with other specified complication: Secondary | ICD-10-CM

## 2016-01-21 DIAGNOSIS — E669 Obesity, unspecified: Secondary | ICD-10-CM | POA: Diagnosis not present

## 2016-01-21 DIAGNOSIS — D6859 Other primary thrombophilia: Secondary | ICD-10-CM | POA: Diagnosis not present

## 2016-01-21 LAB — POCT INR: INR: 1.6
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
## 2016-01-21 NOTE — Patient Instructions (Signed)
INR 1.6 Continue Coumadin 15mg  daily except 17.5mg  on Mondays, Wednesdays, and Fridays

## 2016-01-23 ENCOUNTER — Ambulatory Visit (INDEPENDENT_AMBULATORY_CARE_PROVIDER_SITE_OTHER): Payer: PPO | Admitting: Internal Medicine

## 2016-01-23 ENCOUNTER — Encounter: Payer: Self-pay | Admitting: Internal Medicine

## 2016-01-23 VITALS — BP 110/82 | HR 53 | Temp 97.7°F | Ht 66.0 in | Wt 260.0 lb

## 2016-01-23 DIAGNOSIS — I10 Essential (primary) hypertension: Secondary | ICD-10-CM

## 2016-01-23 DIAGNOSIS — E119 Type 2 diabetes mellitus without complications: Secondary | ICD-10-CM

## 2016-01-23 DIAGNOSIS — E669 Obesity, unspecified: Secondary | ICD-10-CM

## 2016-01-23 DIAGNOSIS — Z7901 Long term (current) use of anticoagulants: Secondary | ICD-10-CM

## 2016-01-23 DIAGNOSIS — E785 Hyperlipidemia, unspecified: Secondary | ICD-10-CM | POA: Diagnosis not present

## 2016-01-23 DIAGNOSIS — D6859 Other primary thrombophilia: Secondary | ICD-10-CM | POA: Diagnosis not present

## 2016-01-23 NOTE — Patient Instructions (Signed)
Lose weight.

## 2016-01-23 NOTE — Progress Notes (Signed)
Patient ID: Colton Miranda, male   DOB: 26-May-1941, 75 y.o.   MRN: [id number]    Facility  [facility]    Place of Service:   OFFICE    No Known Allergies  Chief Complaint  Patient presents with  . Medical Management of Chronic Issues    6 month medication management blood sugar, blood pressure, cholesterol .  Here with wife    HPI:   Type 2 diabetes mellitus without complication, without long-term current use of insulin ([place]) - failed to come for lab prior to visit. Non compliant with diet. Gaining weight.  Essential hypertension - controlled  Hyperlipemia - needs lab  Obesity - continues to gain weight  Primary hypercoagulable state ([place]) - must remain on warfarin for life. Hx PE.  Long term current use of anticoagulant therapy - for hypercoag state    Medications: Patient's Medications  New Prescriptions   No medications on file  Previous Medications   AMBULATORY NON FORMULARY MEDICATION    One Touch Verio Sugar Machine Use as Directed in testing blood sugar Dx:E11.65   AMBULATORY NON FORMULARY MEDICATION    One Touch Verio Test Strips Use to test blood sugar twice daily Dx: E11.65   AMBULATORY NON FORMULARY MEDICATION    One Touch Verio Lancets Use to test blood sugar  Dx:E11.65   CHOLECALCIFEROL (VITAMIN D) 2000 UNITS CAPS    Take 1 capsule by mouth daily.     LISINOPRIL (PRINIVIL,ZESTRIL) 20 MG TABLET    TAKE ONE TABLET DAILY TO CONTROL BLOOD PRESSURE   METFORMIN (GLUCOPHAGE) 500 MG TABLET    TAKE ONE TABLET BY MOUTH ONCE DAILY IN THE MORNING TO CONTROL BLOOD SUGAR   MISC NATURAL PRODUCTS (OSTEO BI-FLEX ADV TRIPLE ST PO)    Take by mouth daily.   NYSTATIN (MYCOSTATIN/NYSTOP) 100000 UNIT/GM POWD    Twice daily to affected area   SIMVASTATIN (ZOCOR) 20 MG TABLET    One daily to control cholesterol   TRAMADOL (ULTRAM) 50 MG TABLET    TAKE ONE TABLET BY MOUTH 4 TIMES DAILY AS NEEDED FOR PAIN   WARFARIN (COUMADIN) 10 MG TABLET    One daily for anticoagulation   WARFARIN (COUMADIN) 5 MG TABLET    15 mg daily except 17.'5mg'$  on Mondays, Wednedays  and Fridays: (1) '5mg'$  tab, 1 & 1/2 on Mondays/Wed/Fri  (1) '10mg'$  tab) for anticoagulation   ZOSTER VACCINE LIVE, PF, (ZOSTAVAX) 65784 UNT/0.65ML INJECTION    Inject 19,400 Units into the skin once.  Modified Medications   No medications on file  Discontinued Medications   No medications on file    Review of Systems  Constitutional: Negative for chills, activity change and appetite change.  HENT: Positive for hearing loss and tinnitus. Negative for ear pain and nosebleeds.   Eyes:       Corrective lenses  Respiratory: Negative for chest tightness and shortness of breath.   Cardiovascular: Positive for leg swelling. Negative for chest pain and palpitations.  Gastrointestinal: Negative for blood in stool and anal bleeding.  Endocrine:       Diabetic  Genitourinary: Negative for hematuria.       Nocturia x 4. Some urgency. Normal stream.  Musculoskeletal: Positive for back pain and arthralgias (shoulder pains).  Skin: Negative.   Neurological: Negative for dizziness, tremors, seizures, syncope, facial asymmetry, speech difficulty, weakness, light-headedness, numbness and headaches.       Previous problems with paresthesias and discomfort in the left arm have not recurred in the last  few months.  Hematological: Negative.        History of hypercoagulable state.  Psychiatric/Behavioral:       Problems with insomnia. Wakes at night. He is up at 4 AM.    Filed Vitals:   01/23/16 1200  BP: 110/82  Pulse: 53  Temp: 97.7 F (36.5 C)  TempSrc: Oral  Height: 5' 6" (1.676 m)  Weight: 260 lb (117.935 kg)  SpO2: 96%   Body mass index is 41.99 kg/(m^2). Filed Weights   01/23/16 1200  Weight: 260 lb (117.935 kg)     Physical Exam  Constitutional: He is oriented to person, place, and time.  Obese  HENT:  History of cerumen impactions  Eyes:   Corrective lenses  Neck: Normal range of motion. Neck  supple. No JVD present. No tracheal deviation present. No thyromegaly present.  Cardiovascular: Normal rate, regular rhythm and normal heart sounds.  Exam reveals no gallop and no friction rub.   No murmur heard. Pulmonary/Chest: Effort normal and breath sounds normal. No respiratory distress. He has no wheezes. He has no rales.  Abdominal: Soft. Bowel sounds are normal. He exhibits no distension and no mass. There is no tenderness.  Musculoskeletal:  Back discomfort to palpation. 3+ bipedal edema. No calf tenderness. Reduced ability to raise left arm at the shoulder. Wobbly gait. Using left knee brace with Velcro closures.  Lymphadenopathy:    He has no cervical adenopathy.  Neurological: He is alert and oriented to person, place, and time. No cranial nerve deficit.  Skin: Skin is warm and dry. No rash noted. No erythema. No pallor.  Psychiatric: He has a normal mood and affect. His behavior is normal. Thought content normal.    Labs reviewed: Lab Summary Latest Ref Rng 07/23/2015  Hemoglobin 13.0-17.0 g/dL (None)  Hematocrit 39.0-52.0 % (None)  White count - (None)  Platelet count - (None)  Sodium 136 - 144 mmol/L 139  Potassium 3.5 - 5.2 mmol/L 4.9  Calcium 8.6 - 10.2 mg/dL 9.2  Phosphorus - (None)  Creatinine 0.76 - 1.27 mg/dL 0.97  AST 0 - 40 IU/L 20  Alk Phos 39 - 117 IU/L 65  Bilirubin 0.0 - 1.2 mg/dL 0.4  Glucose 65 - 99 mg/dL 116(H)  Cholesterol - (None)  HDL cholesterol >39 mg/dL 45  Triglycerides 0 - 149 mg/dL 60  LDL Direct - (None)  LDL Calc 0 - 99 mg/dL 55  Total protein - (None)  Albumin 3.5 - 4.8 g/dL 4.1   No results found for: TSH, T3TOTAL, T4TOTAL, THYROIDAB Lab Results  Component Value Date   BUN 21 07/23/2015   BUN 18 11/08/2014   BUN 17 06/30/2014   Lab Results  Component Value Date   HGBA1C 6.7* 07/23/2015   HGBA1C 6.6* 11/08/2014   HGBA1C 6.8* 06/30/2014    Assessment/Plan  1. Type 2 diabetes mellitus without complication, without  long-term current use of insulin ([facility]) - Hemoglobin I7J - Basic metabolic panel - Hemoglobin A1c; Future - Comprehensive metabolic panel; Future - Microalbumin, urine; Future  2. Essential hypertension - Basic metabolic panel - Comprehensive metabolic panel; Future  3. Hyperlipemia - lipid panel - Lipid panel; Future  4. Obesity Dietary advice given  5. Primary hypercoagulable state ([place]) Stay on warfarin  6. Long term current use of anticoagulant therapy Continue warfarin

## 2016-01-24 LAB — BASIC METABOLIC PANEL
BUN / CREAT RATIO: 21 (ref 10–24)
BUN: 21 mg/dL (ref 8–27)
CALCIUM: 9.6 mg/dL (ref 8.6–10.2)
CHLORIDE: 99 mmol/L (ref 96–106)
CO2: 23 mmol/L (ref 18–29)
Creatinine, Ser: 1 mg/dL (ref 0.76–1.27)
GFR calc Af Amer: 85 mL/min/{1.73_m2} (ref >59)
GFR calc non Af Amer: 74 mL/min/{1.73_m2} (ref >59)
GLUCOSE: 90 mg/dL (ref 65–99)
Potassium: 5.1 mmol/L (ref 3.5–5.2)
Sodium: 137 mmol/L (ref 134–144)

## 2016-01-24 LAB — LIPID PANEL
CHOLESTEROL TOTAL: 106 mg/dL (ref 100–199)
Chol/HDL Ratio: 2.7 ratio units (ref 0.0–5.0)
HDL: 40 mg/dL (ref >39)
LDL Calculated: 45 mg/dL (ref 0–99)
Triglycerides: 103 mg/dL (ref 0–149)
VLDL CHOLESTEROL CAL: 21 mg/dL (ref 5–40)

## 2016-01-24 LAB — HEMOGLOBIN A1C
ESTIMATED AVERAGE GLUCOSE: 146 mg/dL
Hgb A1c MFr Bld: 6.7 % — ABNORMAL HIGH (ref 4.8–5.6)

## 2016-02-04 ENCOUNTER — Other Ambulatory Visit: Payer: Self-pay | Admitting: Internal Medicine

## 2016-03-03 ENCOUNTER — Encounter: Payer: Self-pay | Admitting: Pharmacotherapy

## 2016-03-03 ENCOUNTER — Ambulatory Visit (INDEPENDENT_AMBULATORY_CARE_PROVIDER_SITE_OTHER): Payer: PPO | Admitting: Pharmacotherapy

## 2016-03-03 VITALS — BP 110/60 | HR 56 | Temp 97.9°F | Ht 66.0 in | Wt 241.0 lb

## 2016-03-03 DIAGNOSIS — D6859 Other primary thrombophilia: Secondary | ICD-10-CM | POA: Diagnosis not present

## 2016-03-03 DIAGNOSIS — Z7901 Long term (current) use of anticoagulants: Secondary | ICD-10-CM | POA: Diagnosis not present

## 2016-03-03 LAB — POCT INR: INR: 2.5

## 2016-03-03 NOTE — Progress Notes (Signed)
   Subjective:    Patient ID: Colton Miranda, male    DOB: [birth year], 75 y.o.   MRN: [id number]  HPI Last INR was low at 1.6 due to missed doses. Denies missed doses now. Current Coumadin dose is 15mg  QD except 17.5mg  MWF Denies unusual bleeding or bruising Denies CP, falls Consistent with vitamin K  Review of Systems  HENT: Negative for nosebleeds.   Respiratory: Negative for shortness of breath.   Cardiovascular: Negative for chest pain.  Genitourinary: Negative for hematuria.  Hematological: Does not bruise/bleed easily.       Objective:   Physical Exam  Constitutional: He is oriented to person, place, and time. He appears well-developed and well-nourished.  HENT:  Right Ear: External ear normal.  Left Ear: External ear normal.  Cardiovascular: Normal rate, regular rhythm and normal heart sounds.   Pulmonary/Chest: Effort normal and breath sounds normal.  Neurological: He is alert and oriented to person, place, and time.  Skin: Skin is warm and dry.  Psychiatric: He has a normal mood and affect. His behavior is normal. Judgment and thought content normal.  Vitals reviewed.    BP: 110/60  HR: 56  Wt:  241lb INR 2.5     Assessment & Plan:  1.  INR at goal 2-3 2.  Continue Coumadin 15mg  QD except 17.5mg  MWF 3.  INR 1 month

## 2016-03-03 NOTE — Patient Instructions (Signed)
INR 2.5 Continue Coumadin 15mg  daily except 17.5mg  on Mondays, Wednesdays, Fridays

## 2016-03-24 ENCOUNTER — Other Ambulatory Visit: Payer: Self-pay | Admitting: Internal Medicine

## 2016-04-14 ENCOUNTER — Ambulatory Visit (INDEPENDENT_AMBULATORY_CARE_PROVIDER_SITE_OTHER): Payer: PPO | Admitting: Pharmacotherapy

## 2016-04-14 ENCOUNTER — Encounter: Payer: Self-pay | Admitting: Pharmacotherapy

## 2016-04-14 ENCOUNTER — Ambulatory Visit: Payer: PPO | Admitting: Pharmacotherapy

## 2016-04-14 VITALS — BP 126/78 | HR 54 | Temp 97.8°F | Ht 66.0 in | Wt 233.0 lb

## 2016-04-14 DIAGNOSIS — Z7901 Long term (current) use of anticoagulants: Secondary | ICD-10-CM | POA: Diagnosis not present

## 2016-04-14 DIAGNOSIS — E119 Type 2 diabetes mellitus without complications: Secondary | ICD-10-CM | POA: Diagnosis not present

## 2016-04-14 DIAGNOSIS — D6859 Other primary thrombophilia: Secondary | ICD-10-CM

## 2016-04-14 LAB — POCT INR: INR: 1.7

## 2016-04-14 MED ORDER — TRAMADOL HCL 50 MG PO TABS: ORAL_TABLET | ORAL | 0 refills | Status: DC

## 2016-04-14 NOTE — Progress Notes (Signed)
   Subjective:    Patient ID: Colton Miranda, male    DOB: 07/02/1941, 75 y.o.   MRN: [id number]  HPI Last INR was OK at 2.5 Current Coumadin dose is 15mg  QD except 17.5mg  MWF Denies CP, falls Did miss dose yesterday. No unusual bleeding or bruising Consistent with vitamin K intake.     Review of Systems  HENT: Negative for nosebleeds.   Respiratory: Negative for shortness of breath.   Cardiovascular: Negative for chest pain.  Gastrointestinal: Negative for anal bleeding and blood in stool.  Genitourinary: Negative for hematuria.  Hematological: Does not bruise/bleed easily.       Objective:   Physical Exam  Constitutional: He is oriented to person, place, and time. He appears well-developed and well-nourished.  HENT:  Right Ear: External ear normal.  Left Ear: External ear normal.  Cardiovascular: Normal rate, regular rhythm and normal heart sounds.   Pulmonary/Chest: Effort normal and breath sounds normal.  Neurological: He is alert and oriented to person, place, and time.  Skin: Skin is warm and dry.  Psychiatric: He has a normal mood and affect. His behavior is normal. Judgment and thought content normal.    INR 1.7      Assessment & Plan:  1.  INR below goal 2-3 due to  Missed doses. 2.  Continue Coumadin 15mg  QD except 17.5mg  MWF 3.  RTC 1 month

## 2016-04-14 NOTE — Patient Instructions (Signed)
INR 1.7 Continue Coumadin 15mg  daily except 17.5mg  on Mondays, Wednesdays, Fridays

## 2016-04-17 ENCOUNTER — Ambulatory Visit (INDEPENDENT_AMBULATORY_CARE_PROVIDER_SITE_OTHER): Payer: PPO | Admitting: Nurse Practitioner

## 2016-04-17 ENCOUNTER — Encounter: Payer: Self-pay | Admitting: Nurse Practitioner

## 2016-04-17 VITALS — BP 122/84 | HR 56 | Temp 97.7°F | Resp 17 | Ht 66.0 in | Wt 234.2 lb

## 2016-04-17 DIAGNOSIS — L989 Disorder of the skin and subcutaneous tissue, unspecified: Secondary | ICD-10-CM

## 2016-04-17 NOTE — Patient Instructions (Signed)
No infection noted  Keep appointment with dermatologist

## 2016-04-17 NOTE — Progress Notes (Signed)
PCP: Jeanmarie Hubert, MD  Advanced Directive information Does patient have an advance directive?: Yes, Type of Advance Directive: [place];Living will, Does patient want to make changes to advanced directive?: No - Patient declined  No Known Allergies  Chief Complaint  Patient presents with  . Medical Management of Chronic Issues    Brown crusted spot on back of left leg since Sunday. Unknown cause.      HPI: Patient is a 75 y.o. male seen in the office today for a crusty mole on the back of his leg that he just noticed 5 days ago. No pain or drainage noted, just feels differently.  sees dermatologist routinely and has follow up next month.  Has had a hx of seborrheic keratosis  Review of Systems:  Review of Systems  Constitutional: Negative for chills, fatigue and fever.  Skin: Negative for color change, pallor, rash and wound.    Past Medical History:  Diagnosis Date  . Acute, but ill-defined, cerebrovascular disease (HCC)   . Basal cell carcinoma of skin of trunk, except scrotum   . Chronic airway obstruction, not elsewhere classified   . Diabetes mellitus 10/2010   T2DM, new dx   . Disturbance of skin sensation   . Edema   . Epistaxis   . Epistaxis   . 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 History of fall 07/25/2015  . Hypertension   . Hypertrophy of prostate with urinary obstruction and other lower urinary tract symptoms (LUTS)   . Impacted cerumen 02/23/13  . Long term (current) use of anticoagulants   . Memory loss   . Myalgia and myositis, unspecified   . Nonspecific abnormal electrocardiogram (ECG) (EKG)   . Obesity (BMI 30-39.9)   . Obesity, unspecified   . Osteoarthrosis, unspecified whether generalized or localized, unspecified site   . Other and unspecified disc disorder of lumbar region   . Other and unspecified hyperlipidemia   . Other pulmonary embolism and infarction 1988  . Pain in joint, lower leg   . Pain in limb   .  Palpitations   . Primary hypercoagulable state (HCC)   . Right bundle branch block   . Seborrheic dermatitis, unspecified   . Stiffness of joint, not elsewhere classified, unspecified site   . Stroke (HCC)   . Tobacco use disorder   . Type II or unspecified type diabetes mellitus without mention of complication, not stated as uncontrolled   . Type II or unspecified type diabetes mellitus without mention of complication, uncontrolled   . Undiagnosed cardiac murmurs   . Unspecified constipation   . Unspecified late effects of cerebrovascular disease   . Unspecified vitamin D deficiency   . Urinary frequency    Past Surgical History:  Procedure Laterality Date  . CHOLECYSTECTOMY  1981  . COLONOSCOPY  2009  . PARTIAL HIP ARTHROPLASTY Right 06/18/2009   Dr. Kendall  . PILONIDAL CYST EXCISION  1968   base of spine   Social History:   reports that he quit smoking about 7 years ago. He has never used smokeless tobacco. He reports that he drinks about 1.8 oz of alcohol per week . He reports that he does not use drugs.  Family History  Problem Relation Age of Onset  . Cancer Mother 97    colon , breast  . Heart attack Daughter     20 08  . Parkinson's disease Sister     Medications: Patient's Medications  New Prescriptions  No medications on file  Previous Medications   BLOOD GLUCOSE MONITORING SUPPL (ONETOUCH VERIO) W/DEVICE KIT    by Does not apply route. Check blood sugar twice daily as directed DX E11.65   CHOLECALCIFEROL (VITAMIN D) 2000 UNITS CAPS    Take 1 capsule by mouth daily.     GLUCOSE BLOOD (ONETOUCH VERIO) TEST STRIP    Check blood sugar twice daily as directed E11.65   LISINOPRIL (PRINIVIL,ZESTRIL) 20 MG TABLET    TAKE ONE TABLET DAILY TO CONTROL BLOOD PRESSURE   METFORMIN (GLUCOPHAGE) 500 MG TABLET    TAKE ONE TABLET BY MOUTH IN THE MORNING FOR  BLOOD  SUGAR  CONTROL   MISC NATURAL PRODUCTS (OSTEO BI-FLEX ADV TRIPLE ST PO)    Take by mouth daily.   NYSTATIN  (MYCOSTATIN/NYSTOP) 100000 UNIT/GM POWD    Twice daily to affected area   ONETOUCH DELICA LANCETS 15A MISC    by Does not apply route. Check blood sugar twice daily E11.65   SIMVASTATIN (ZOCOR) 20 MG TABLET    One daily to control cholesterol   TRAMADOL (ULTRAM) 50 MG TABLET    TAKE ONE TABLET BY MOUTH 4 TIMES DAILY AS NEEDED FOR PAIN   WARFARIN (COUMADIN) 5 MG TABLET    15 mg daily except 17.37m on Mondays, Wednedays  and Fridays: (1) 57mtab, 1 & 1/2 on Mondays/Wed/Fri  (1) 1019mab) for anticoagulation  Modified Medications   No medications on file  Discontinued Medications   WARFARIN (COUMADIN) 10 MG TABLET    One daily for anticoagulation     Physical Exam:  Vitals:   04/17/16 1416  BP: 122/84  Pulse: (!) 56  Resp: 17  Temp: 97.7 F (36.5 C)  TempSrc: Oral  SpO2: 97%  Weight: 234 lb 3.2 oz (106.2 kg)  Height: 5' 6"  (1.676 m)   Body mass index is 37.8 kg/m.  Physical Exam  Constitutional: He appears well-developed and well-nourished. No distress.  Skin: Skin is warm and dry. He is not diaphoretic.  1 x 0.5 cm brown lesion that's irregular and crusty    Labs reviewed: Basic Metabolic Panel:  Recent Labs  07/23/15 0833 01/23/16 1255  NA 139 137  K 4.9 5.1  CL 100 99  CO2 22 23  GLUCOSE 116* 90  BUN 21 21  CREATININE 0.97 1.00  CALCIUM 9.2 9.6   Liver Function Tests:  Recent Labs  07/23/15 0833  AST 20  ALT 23  ALKPHOS 65  BILITOT 0.4  PROT 6.9  ALBUMIN 4.1   No results for input(s): LIPASE, AMYLASE in the last 8760 hours. No results for input(s): AMMONIA in the last 8760 hours. CBC: No results for input(s): WBC, NEUTROABS, HGB, HCT, MCV, PLT in the last 8760 hours. Lipid Panel:  Recent Labs  07/23/15 0833 01/23/16 1255  CHOL 112 106  HDL 45 40  LDLCALC 55 45  TRIG 60 103  CHOLHDL 2.5 2.7   TSH: No results for input(s): TSH in the last 8760 hours. A1C: Lab Results  Component Value Date   HGBA1C 6.7 (H) 01/23/2016      Assessment/Plan 1. Skin lesion No signs of infection. Not causing discomfort.  Most likely seborrheic keratosis, to keep dermatologist follow up for evaluation and reassurance.   JesCarlos AmericanubHarle BattiestieSanford University Of South Dakota Medical CenterAdult Medicine [phone] - 5 pm) [phone] hours)

## 2016-04-19 ENCOUNTER — Other Ambulatory Visit: Payer: Self-pay | Admitting: Internal Medicine

## 2016-04-19 DIAGNOSIS — I10 Essential (primary) hypertension: Secondary | ICD-10-CM

## 2016-05-09 ENCOUNTER — Other Ambulatory Visit: Payer: Self-pay

## 2016-05-09 DIAGNOSIS — E119 Type 2 diabetes mellitus without complications: Secondary | ICD-10-CM

## 2016-05-09 DIAGNOSIS — E785 Hyperlipidemia, unspecified: Secondary | ICD-10-CM

## 2016-05-18 ENCOUNTER — Other Ambulatory Visit: Payer: Self-pay | Admitting: Pharmacotherapy

## 2016-05-18 DIAGNOSIS — Z7901 Long term (current) use of anticoagulants: Secondary | ICD-10-CM

## 2016-05-18 DIAGNOSIS — D6859 Other primary thrombophilia: Secondary | ICD-10-CM

## 2016-05-23 ENCOUNTER — Other Ambulatory Visit: Payer: PPO

## 2016-05-23 DIAGNOSIS — E119 Type 2 diabetes mellitus without complications: Secondary | ICD-10-CM | POA: Diagnosis not present

## 2016-05-23 DIAGNOSIS — E785 Hyperlipidemia, unspecified: Secondary | ICD-10-CM | POA: Diagnosis not present

## 2016-05-23 LAB — COMPLETE METABOLIC PANEL WITH GFR
ALBUMIN: 4.3 g/dL (ref 3.6–5.1)
ALK PHOS: 57 U/L (ref 40–115)
ALT: 24 U/L (ref 9–46)
AST: 23 U/L (ref 10–35)
BILIRUBIN TOTAL: 0.6 mg/dL (ref 0.2–1.2)
BUN: 16 mg/dL (ref 7–25)
CALCIUM: 9.4 mg/dL (ref 8.6–10.3)
CO2: 26 mmol/L (ref 20–31)
Chloride: 101 mmol/L (ref 98–110)
Creat: 1.12 mg/dL (ref 0.70–1.18)
GFR, EST AFRICAN AMERICAN: 74 mL/min (ref >=60)
GFR, EST NON AFRICAN AMERICAN: 64 mL/min (ref >=60)
Glucose, Bld: 95 mg/dL (ref 65–99)
Potassium: 4.8 mmol/L (ref 3.5–5.3)
Sodium: 134 mmol/L — ABNORMAL LOW (ref 135–146)
TOTAL PROTEIN: 6.9 g/dL (ref 6.1–8.1)

## 2016-05-23 LAB — LIPID PANEL
CHOLESTEROL: 104 mg/dL — AB (ref 125–200)
HDL: 43 mg/dL (ref >=40)
LDL Cholesterol: 43 mg/dL (ref <130)
TRIGLYCERIDES: 89 mg/dL (ref <150)
Total CHOL/HDL Ratio: 2.4 Ratio (ref <=5.0)
VLDL: 18 mg/dL (ref <30)

## 2016-05-24 LAB — MICROALBUMIN / CREATININE URINE RATIO
Creatinine, Urine: 171 mg/dL (ref 20–370)
MICROALB/CREAT RATIO: 5 ug/mg{creat} (ref <30)
Microalb, Ur: 0.9 mg/dL

## 2016-05-24 LAB — HEMOGLOBIN A1C
Hgb A1c MFr Bld: 6.1 % — ABNORMAL HIGH (ref <5.7)
MEAN PLASMA GLUCOSE: 128 mg/dL

## 2016-05-28 ENCOUNTER — Ambulatory Visit (INDEPENDENT_AMBULATORY_CARE_PROVIDER_SITE_OTHER): Payer: PPO | Admitting: Internal Medicine

## 2016-05-28 ENCOUNTER — Encounter: Payer: Self-pay | Admitting: Internal Medicine

## 2016-05-28 VITALS — BP 128/82 | HR 50 | Temp 97.6°F | Ht 66.0 in | Wt 228.0 lb

## 2016-05-28 DIAGNOSIS — I10 Essential (primary) hypertension: Secondary | ICD-10-CM | POA: Diagnosis not present

## 2016-05-28 DIAGNOSIS — Z2821 Immunization not carried out because of patient refusal: Secondary | ICD-10-CM

## 2016-05-28 DIAGNOSIS — R002 Palpitations: Secondary | ICD-10-CM | POA: Diagnosis not present

## 2016-05-28 DIAGNOSIS — E669 Obesity, unspecified: Secondary | ICD-10-CM | POA: Diagnosis not present

## 2016-05-28 DIAGNOSIS — E119 Type 2 diabetes mellitus without complications: Secondary | ICD-10-CM

## 2016-05-28 DIAGNOSIS — E785 Hyperlipidemia, unspecified: Secondary | ICD-10-CM

## 2016-05-28 DIAGNOSIS — Z7901 Long term (current) use of anticoagulants: Secondary | ICD-10-CM | POA: Diagnosis not present

## 2016-05-28 NOTE — Progress Notes (Signed)
Facility  [facility]    Place of Service:   OFFICE    No Known Allergies  Chief Complaint  Patient presents with  . Medical Management of Chronic Issues    4 month blood sugar, blood pressure, cholesterol, review labs, here with wife    HPI:  Patient seen for an acute visit 04/17/2016 for brown crusty mole on the back of the left leg. Has not seen dermatologist.   Essential hypertension -  controlled  Type 2 diabetes mellitus without complication, without long-term current use of insulin ([place]) - controlled  Hyperlipemia - controlled  Long term current use of anticoagulant therapy - for hypercoagulable syndrome  Obesity - making some progress in weight loss  Palpitations - occurs maybe twice a month. No associated pain or dyspnea.    Medications: Patient's Medications  New Prescriptions   No medications on file  Previous Medications   BLOOD GLUCOSE MONITORING SUPPL (ONETOUCH VERIO) W/DEVICE KIT    by Does not apply route. Check blood sugar twice daily as directed DX E11.65   CHOLECALCIFEROL (VITAMIN D) 2000 UNITS CAPS    Take 1 capsule by mouth daily.     GLUCOSE BLOOD (ONETOUCH VERIO) TEST STRIP    Check blood sugar twice daily as directed E11.65   LISINOPRIL (PRINIVIL,ZESTRIL) 20 MG TABLET    TAKE ONE TABLET BY MOUTH ONCE DAILY FOR BLOOD PRESSURE   METFORMIN (GLUCOPHAGE) 500 MG TABLET    TAKE ONE TABLET BY MOUTH IN THE MORNING FOR  BLOOD  SUGAR  CONTROL   MISC NATURAL PRODUCTS (OSTEO BI-FLEX ADV TRIPLE ST PO)    Take by mouth daily.   ONETOUCH DELICA LANCETS 64P MISC    by Does not apply route. Check blood sugar twice daily E11.65   SIMVASTATIN (ZOCOR) 20 MG TABLET    One daily to control cholesterol   TRAMADOL (ULTRAM) 50 MG TABLET    TAKE ONE TABLET BY MOUTH 4 TIMES DAILY AS NEEDED FOR PAIN   WARFARIN (COUMADIN) 5 MG TABLET    15 mg daily except 17.25m on Mondays, Wednedays  and Fridays: (1) 553mtab, 1 & 1/2 on Mondays/Wed/Fri  (1) 1023mab) for anticoagulation   WARFARIN (COUMADIN) 5 MG TABLET    TAKE THREE TABLETS DAILY(15 MG) EXCEPT TAKE THREE AND ONE- HALF TABLETS(17.5 MG) ON MONDAYS, WEDNESDAY AND FRIDAYS AS DIRECTED BY PRESCRIBE  Modified Medications   No medications on file  Discontinued Medications   NYSTATIN (MYCOSTATIN/NYSTOP) 100000 UNIT/GM POWD    Twice daily to affected area    Review of Systems  Constitutional: Negative for activity change, appetite change and chills.  HENT: Positive for hearing loss and tinnitus. Negative for ear pain and nosebleeds.   Eyes:       Corrective lenses  Respiratory: Negative for chest tightness and shortness of breath.   Cardiovascular: Positive for leg swelling. Negative for chest pain and palpitations.  Gastrointestinal: Negative for anal bleeding and blood in stool.  Endocrine:       Diabetic  Genitourinary: Negative for hematuria.       Nocturia x 4. Some urgency. Normal stream.  Musculoskeletal: Positive for arthralgias (shoulder pains) and back pain.  Skin: Negative.   Neurological: Negative for dizziness, tremors, seizures, syncope, facial asymmetry, speech difficulty, weakness, light-headedness, numbness and headaches.       Previous problems with paresthesias and discomfort in the left arm have not recurred in the last few months.  Hematological: Negative.  History of hypercoagulable state.  Psychiatric/Behavioral:       Problems with insomnia. Wakes at night. He is up at 4 AM.    Vitals:   05/28/16 1522  BP: 128/82  Pulse: (!) 50  Temp: 97.6 F (36.4 C)  TempSrc: Oral  SpO2: 98%  Weight: 228 lb (103.4 kg)  Height: 5' 6"  (1.676 m)   Body mass index is 36.8 kg/m. Wt Readings from Last 3 Encounters:  05/28/16 228 lb (103.4 kg)  04/17/16 234 lb 3.2 oz (106.2 kg)  04/14/16 233 lb (105.7 kg)      Physical Exam  Constitutional: He is oriented to person, place, and time.  Obese  HENT:  History of cerumen impactions  Eyes:   Corrective lenses  Neck: Normal range of  motion. Neck supple. No JVD present. No tracheal deviation present. No thyromegaly present.  Cardiovascular: Normal rate, regular rhythm and normal heart sounds.  Exam reveals no gallop and no friction rub.   No murmur heard. Pulmonary/Chest: Effort normal and breath sounds normal. No respiratory distress. He has no wheezes. He has no rales.  Abdominal: Soft. Bowel sounds are normal. He exhibits no distension and no mass. There is no tenderness.  Musculoskeletal:  Back discomfort to palpation. 3+ bipedal edema. No calf tenderness. Reduced ability to raise left arm at the shoulder. Wobbly gait. Using left knee brace with Velcro closures.  Lymphadenopathy:    He has no cervical adenopathy.  Neurological: He is alert and oriented to person, place, and time. No cranial nerve deficit.  Skin: Skin is warm and dry. No rash noted. No erythema. No pallor.  Psychiatric: He has a normal mood and affect. His behavior is normal. Thought content normal.    Labs reviewed: Lab Summary Latest Ref Rng & Units 05/23/2016 01/23/2016 07/23/2015  Hemoglobin 13.0-17.0 g/dL (None) (None) (None)  Hematocrit 39.0-52.0 % (None) (None) (None)  White count - (None) (None) (None)  Platelet count - (None) (None) (None)  Sodium 135 - 146 mmol/L 134(L) 137 139  Potassium 3.5 - 5.3 mmol/L 4.8 5.1 4.9  Calcium 8.6 - 10.3 mg/dL 9.4 9.6 9.2  Phosphorus - (None) (None) (None)  Creatinine 0.70 - 1.18 mg/dL 1.12 1.00 0.97  AST 10 - 35 U/L 23 (None) 20  Alk Phos 40 - 115 U/L 57 (None) 65  Bilirubin 0.2 - 1.2 mg/dL 0.6 (None) 0.4  Glucose 65 - 99 mg/dL 95 90 116(H)  Cholesterol 125 - 200 mg/dL 104(L) (None) (None)  HDL cholesterol >=40 mg/dL 43 40 45  Triglycerides <150 mg/dL 89 103 60  LDL Direct - (None) (None) (None)  LDL Calc <130 mg/dL 43 45 55  Total protein 6.1 - 8.1 g/dL 6.9 (None) (None)  Albumin 3.6 - 5.1 g/dL 4.3 (None) 4.1  Some recent data might be hidden   No results found for: TSH, T3TOTAL, T4TOTAL,  THYROIDAB Lab Results  Component Value Date   BUN 16 05/23/2016   BUN 21 01/23/2016   BUN 21 07/23/2015   Lab Results  Component Value Date   HGBA1C 6.1 (H) 05/23/2016   HGBA1C 6.7 (H) 01/23/2016   HGBA1C 6.7 (H) 07/23/2015    Assessment/Plan  1. Essential hypertension - Comprehensive metabolic panel; Future  2. Type 2 diabetes mellitus without complication, without long-term current use of insulin (HCC) - Comprehensive metabolic panel; Future - Hemoglobin A1c; Future  3. Hyperlipemia controlled  4. Long term current use of anticoagulant therapy Continue current dose of warfarin  5. Obesity Continue dieting  6. Palpitations Seems like this is under control  7. Influenza vaccination declined Patient states he had a reaction the last time he had a flu vaccine and he is afraid to get anymore.

## 2016-06-02 ENCOUNTER — Encounter: Payer: Self-pay | Admitting: Pharmacotherapy

## 2016-06-02 ENCOUNTER — Ambulatory Visit (INDEPENDENT_AMBULATORY_CARE_PROVIDER_SITE_OTHER): Payer: PPO | Admitting: Pharmacotherapy

## 2016-06-02 VITALS — BP 104/60 | HR 64 | Resp 14 | Ht 66.0 in | Wt 226.6 lb

## 2016-06-02 DIAGNOSIS — Z7901 Long term (current) use of anticoagulants: Secondary | ICD-10-CM

## 2016-06-02 DIAGNOSIS — D6859 Other primary thrombophilia: Secondary | ICD-10-CM

## 2016-06-02 LAB — POCT INR: INR: 2.5

## 2016-06-02 NOTE — Progress Notes (Signed)
   Subjective:    Patient ID: Colton Miranda, male    DOB: [birth year], 75 y.o.   MRN: [id number]  HPI Last INR on 04/14/16 was low at 1.7 due to missed doses.  Current Coumadin dose is 15mg  QD except 17.5mg  MWF. Denies missed doses. Denies unusual bleeding or bruising. Denies CP, SOB, falls Consistent with vitamin K intake.  Average BG: 111mg /dl    Review of Systems  HENT: Negative for nosebleeds.   Respiratory: Negative for shortness of breath.   Cardiovascular: Negative for chest pain.  Gastrointestinal: Negative for anal bleeding and blood in stool.  Genitourinary: Negative for hematuria.  Hematological: Does not bruise/bleed easily.       Objective:   Physical Exam  Constitutional: He is oriented to person, place, and time. He appears well-developed and well-nourished.  HENT:  Right Ear: External ear normal.  Left Ear: External ear normal.  Cardiovascular: Normal rate, regular rhythm and normal heart sounds.   Pulmonary/Chest: Effort normal and breath sounds normal.  Neurological: He is alert and oriented to person, place, and time.  Skin: Skin is warm and dry.  Psychiatric: He has a normal mood and affect. His behavior is normal. Judgment and thought content normal.  Vitals reviewed.    BP: 104/60  HR: 64  Wt: 226lb INR 2.5     Assessment & Plan:  1.  INR at goal 2-3 2.  Continue Coumadin 15mg  QD except 17.5mg  MWF 3.  Average BG is excellent  - last A1C was 6.1%. 4.  RTC in 6 weeks

## 2016-06-02 NOTE — Patient Instructions (Signed)
INR 2.5 Continue Coumadin 15mg  daily except 17.5mg  on Mondays, Wednesdays, Fridays.

## 2016-06-10 DIAGNOSIS — L821 Other seborrheic keratosis: Secondary | ICD-10-CM | POA: Diagnosis not present

## 2016-06-10 DIAGNOSIS — Z85828 Personal history of other malignant neoplasm of skin: Secondary | ICD-10-CM | POA: Diagnosis not present

## 2016-06-16 ENCOUNTER — Other Ambulatory Visit: Payer: Self-pay | Admitting: Internal Medicine

## 2016-06-16 DIAGNOSIS — E119 Type 2 diabetes mellitus without complications: Secondary | ICD-10-CM

## 2016-07-14 ENCOUNTER — Encounter: Payer: Self-pay | Admitting: Pharmacotherapy

## 2016-07-14 ENCOUNTER — Ambulatory Visit (INDEPENDENT_AMBULATORY_CARE_PROVIDER_SITE_OTHER): Payer: PPO | Admitting: Pharmacotherapy

## 2016-07-14 VITALS — BP 110/80 | HR 62 | Ht 66.0 in | Wt 227.2 lb

## 2016-07-14 DIAGNOSIS — E119 Type 2 diabetes mellitus without complications: Secondary | ICD-10-CM | POA: Diagnosis not present

## 2016-07-14 DIAGNOSIS — Z7901 Long term (current) use of anticoagulants: Secondary | ICD-10-CM | POA: Diagnosis not present

## 2016-07-14 DIAGNOSIS — D6859 Other primary thrombophilia: Secondary | ICD-10-CM | POA: Diagnosis not present

## 2016-07-14 LAB — POCT INR: INR: 1.7

## 2016-07-14 NOTE — Patient Instructions (Signed)
INR 1.7  Continue Coumadin 15mg  daily except 17.5mg  on Mondays, Wednesdays, Fridays

## 2016-07-14 NOTE — Progress Notes (Signed)
   Subjective:    Patient ID: Colton Miranda, male    DOB: 1940/10/07, 75 y.o.   MRN: [id number]  HPI Last INR on 06/02/16 was OK at 2.5 Current Coumadin 15mg  QD except 17.5mg  MWF. He did miss a dose on Sunday.  He has also consumed 2 servings of spinach this week. Denies unusual bleeding or bruising Denies CP, SOB, falls  Average BG:  113mg /dl No hypoglycemia  Review of Systems  HENT: Negative for nosebleeds.   Respiratory: Negative for shortness of breath.   Cardiovascular: Negative for chest pain.  Gastrointestinal: Negative for anal bleeding and blood in stool.  Genitourinary: Negative for hematuria.  Hematological: Does not bruise/bleed easily.       Objective:   Physical Exam  Constitutional: He is oriented to person, place, and time. He appears well-developed and well-nourished.  HENT:  Right Ear: External ear normal.  Left Ear: External ear normal.  Cardiovascular: Normal rate, regular rhythm and normal heart sounds.   Pulmonary/Chest: Effort normal and breath sounds normal.  Neurological: He is alert and oriented to person, place, and time.  Skin: Skin is warm and dry.  Psychiatric: He has a normal mood and affect. His behavior is normal. Judgment and thought content normal.  Nursing note and vitals reviewed.  BP: 110/80  HR: 62  Wt: 227lb INR 1.7       Assessment & Plan:  1.  INR below goal 2-3 due to missed dose and increased vitamin K intake. 2.  Continue Coumadin 15mg  daily except 17.5mg  on MWF 3.  DM well controlled without hypoglycemia. 4.  RTC in 1 month

## 2016-07-28 ENCOUNTER — Other Ambulatory Visit: Payer: Self-pay | Admitting: Internal Medicine

## 2016-07-28 DIAGNOSIS — I10 Essential (primary) hypertension: Secondary | ICD-10-CM

## 2016-08-18 ENCOUNTER — Other Ambulatory Visit: Payer: Self-pay | Admitting: *Deleted

## 2016-08-18 DIAGNOSIS — E119 Type 2 diabetes mellitus without complications: Secondary | ICD-10-CM

## 2016-08-18 MED ORDER — GLUCOSE BLOOD VI STRP: ORAL_STRIP | 11 refills | Status: DC

## 2016-08-18 NOTE — Telephone Encounter (Signed)
Patient requested to be filled.

## 2016-08-20 ENCOUNTER — Other Ambulatory Visit: Payer: Self-pay | Admitting: Nurse Practitioner

## 2016-08-20 DIAGNOSIS — E119 Type 2 diabetes mellitus without complications: Secondary | ICD-10-CM

## 2016-08-21 ENCOUNTER — Other Ambulatory Visit: Payer: Self-pay

## 2016-08-21 DIAGNOSIS — E119 Type 2 diabetes mellitus without complications: Secondary | ICD-10-CM

## 2016-08-21 MED ORDER — TRAMADOL HCL 50 MG PO TABS: ORAL_TABLET | ORAL | 0 refills | Status: DC

## 2016-08-25 ENCOUNTER — Ambulatory Visit (INDEPENDENT_AMBULATORY_CARE_PROVIDER_SITE_OTHER): Payer: PPO | Admitting: Pharmacotherapy

## 2016-08-25 ENCOUNTER — Encounter: Payer: Self-pay | Admitting: Pharmacotherapy

## 2016-08-25 VITALS — BP 138/78 | HR 44 | Temp 97.5°F | Ht 66.0 in | Wt 227.0 lb

## 2016-08-25 DIAGNOSIS — D6859 Other primary thrombophilia: Secondary | ICD-10-CM | POA: Diagnosis not present

## 2016-08-25 DIAGNOSIS — R001 Bradycardia, unspecified: Secondary | ICD-10-CM

## 2016-08-25 DIAGNOSIS — E119 Type 2 diabetes mellitus without complications: Secondary | ICD-10-CM | POA: Diagnosis not present

## 2016-08-25 DIAGNOSIS — Z7901 Long term (current) use of anticoagulants: Secondary | ICD-10-CM

## 2016-08-25 LAB — POCT INR: INR: 2.2

## 2016-08-25 NOTE — Patient Instructions (Signed)
Continue Coumadin 15mg  daily except 17.5mg  Mondays, Wednesdays, Fridays  Needs to see cardiology for bradycardia - Dr. Nadyne Coombes?

## 2016-08-25 NOTE — Progress Notes (Signed)
   Subjective:    Patient ID: Colton Miranda, male    DOB: 1940-10-09, 75 y.o.   MRN: [id number]  HPI Last INR on 07/14/16 was low at 1.7 Current Coumadin dose is 15mg  QD except 17.5mg  MWF Denies missed doses. Denies unusual bleeding or bruising. Denies CP, falls Consistent with vitamin K intake.  Average BG: 111mg /dl    Review of Systems  HENT: Negative for nosebleeds.   Respiratory: Negative for shortness of breath.   Cardiovascular: Negative for chest pain.  Gastrointestinal: Negative for anal bleeding and blood in stool.  Genitourinary: Negative for hematuria.  Hematological: Does not bruise/bleed easily.       Objective:   Physical Exam  Constitutional: He is oriented to person, place, and time. He appears well-developed and well-nourished.  HENT:  Right Ear: External ear normal.  Left Ear: External ear normal.  Cardiovascular: Normal rate, regular rhythm and normal heart sounds.   Pulmonary/Chest: Effort normal and breath sounds normal.  Neurological: He is alert and oriented to person, place, and time.  Skin: Skin is warm and dry.  Psychiatric: He has a normal mood and affect. His behavior is normal. Judgment and thought content normal.  Vitals reviewed.  BP: 138/78  HR: 44 (repeated - 44 again) wt: 227lb INR 2.2  Discussed low HR with Dr. Mariea Clonts - will do ECG today. ECG with sinus bradycardia - 46 bpm Has not seen a cardiologist in 5-6 years.      Assessment & Plan:  1.  INR at goal 2-3 2.  Continue Coumadin 15mg  QD except 17.5mg  MWF 3.  DM well controlled with average BG: 111mg /dl 4.  Bradycardia - will do ECG today per Dr. Mariea Clonts 5.  Will refer to cardiology - he saw Dr. Nadyne Coombes 5-6 years ago.Marland Kitchen

## 2016-09-19 DIAGNOSIS — R001 Bradycardia, unspecified: Secondary | ICD-10-CM | POA: Diagnosis not present

## 2016-09-19 DIAGNOSIS — E785 Hyperlipidemia, unspecified: Secondary | ICD-10-CM | POA: Diagnosis not present

## 2016-09-19 DIAGNOSIS — R002 Palpitations: Secondary | ICD-10-CM | POA: Diagnosis not present

## 2016-09-19 DIAGNOSIS — I10 Essential (primary) hypertension: Secondary | ICD-10-CM | POA: Diagnosis not present

## 2016-09-25 ENCOUNTER — Other Ambulatory Visit: Payer: Self-pay | Admitting: Internal Medicine

## 2016-09-29 ENCOUNTER — Ambulatory Visit (INDEPENDENT_AMBULATORY_CARE_PROVIDER_SITE_OTHER): Payer: PPO | Admitting: Pharmacotherapy

## 2016-09-29 ENCOUNTER — Ambulatory Visit: Payer: PPO | Admitting: Pharmacotherapy

## 2016-09-29 ENCOUNTER — Encounter: Payer: Self-pay | Admitting: Pharmacotherapy

## 2016-09-29 VITALS — BP 122/68 | HR 54 | Resp 12 | Ht 66.0 in | Wt 227.0 lb

## 2016-09-29 DIAGNOSIS — Z7901 Long term (current) use of anticoagulants: Secondary | ICD-10-CM

## 2016-09-29 DIAGNOSIS — I10 Essential (primary) hypertension: Secondary | ICD-10-CM

## 2016-09-29 DIAGNOSIS — D6859 Other primary thrombophilia: Secondary | ICD-10-CM

## 2016-09-29 LAB — POCT INR: INR: 2.5

## 2016-09-29 MED ORDER — LISINOPRIL 20 MG PO TABS: ORAL_TABLET | ORAL | 1 refills | Status: DC

## 2016-09-29 NOTE — Patient Instructions (Signed)
INR 2.5  Continue Coumadin 15mg  daily except 17.5mg  Mondays, Wednesdays, Fridays

## 2016-09-29 NOTE — Progress Notes (Signed)
   Subjective:    Patient ID: Colton Miranda, male    DOB: 1941/05/24, 76 y.o.   MRN: [id number]  HPI Last INR on 08/25/16 was OK at 2.2 Current Coumadin 15mg  daily except 17.5mg  MWF Denies CP, SOB, falls Denies missed doses. Denies unusual bleeding or bruising. Consistent with vitamin K intake.  He is complaining of both heels hurting at night for the last 2 days.  No open areas or evidence of skin breakdown.  Average BG: 116mg /dl No hypoglycemia    Review of Systems  HENT: Negative for nosebleeds.   Respiratory: Negative for shortness of breath.   Cardiovascular: Negative for chest pain.  Gastrointestinal: Negative for anal bleeding and blood in stool.  Genitourinary: Negative for hematuria.  Hematological: Does not bruise/bleed easily.       Objective:   Physical Exam  Constitutional: He is oriented to person, place, and time. He appears well-developed and well-nourished.  HENT:  Right Ear: External ear normal.  Left Ear: External ear normal.  Cardiovascular: Normal rate and regular rhythm.   Pulmonary/Chest: Effort normal and breath sounds normal.  Neurological: He is alert and oriented to person, place, and time.  Skin: Skin is warm and dry.  Psychiatric: He has a normal mood and affect. His behavior is normal. Judgment and thought content normal.  Vitals reviewed.    BP:  122/68  HR: 54   INR 2.5     Assessment & Plan:  1.  INR at goal 2-3 2.  Continue Coumadin 15mg  QD except 17.5mg  MWF 3.  RTC 1 month

## 2016-10-05 ENCOUNTER — Other Ambulatory Visit: Payer: Self-pay | Admitting: Internal Medicine

## 2016-10-05 DIAGNOSIS — D6859 Other primary thrombophilia: Secondary | ICD-10-CM

## 2016-10-05 DIAGNOSIS — Z7901 Long term (current) use of anticoagulants: Secondary | ICD-10-CM

## 2016-10-06 MED ORDER — WARFARIN SODIUM 10 MG PO TABS: ORAL_TABLET | ORAL | 1 refills | Status: DC

## 2016-10-07 ENCOUNTER — Other Ambulatory Visit: Payer: Self-pay | Admitting: *Deleted

## 2016-10-07 DIAGNOSIS — Z7901 Long term (current) use of anticoagulants: Secondary | ICD-10-CM

## 2016-10-07 MED ORDER — WARFARIN SODIUM 10 MG PO TABS: ORAL_TABLET | ORAL | 1 refills | Status: DC

## 2016-10-07 NOTE — Telephone Encounter (Signed)
[facility] Rx Clarification.

## 2016-10-20 ENCOUNTER — Other Ambulatory Visit: Payer: Self-pay | Admitting: Internal Medicine

## 2016-10-20 DIAGNOSIS — E785 Hyperlipidemia, unspecified: Secondary | ICD-10-CM

## 2016-10-26 ENCOUNTER — Other Ambulatory Visit: Payer: Self-pay | Admitting: Internal Medicine

## 2016-10-26 DIAGNOSIS — E119 Type 2 diabetes mellitus without complications: Secondary | ICD-10-CM

## 2016-10-27 ENCOUNTER — Other Ambulatory Visit: Payer: Self-pay | Admitting: Internal Medicine

## 2016-10-27 DIAGNOSIS — E119 Type 2 diabetes mellitus without complications: Secondary | ICD-10-CM

## 2016-11-10 ENCOUNTER — Ambulatory Visit (INDEPENDENT_AMBULATORY_CARE_PROVIDER_SITE_OTHER): Payer: PPO | Admitting: Pharmacotherapy

## 2016-11-10 ENCOUNTER — Encounter: Payer: Self-pay | Admitting: Pharmacotherapy

## 2016-11-10 VITALS — BP 128/62 | HR 51 | Ht 66.0 in | Wt 233.0 lb

## 2016-11-10 DIAGNOSIS — D6859 Other primary thrombophilia: Secondary | ICD-10-CM

## 2016-11-10 DIAGNOSIS — Z7901 Long term (current) use of anticoagulants: Secondary | ICD-10-CM | POA: Diagnosis not present

## 2016-11-10 LAB — POCT INR: INR: 2.4

## 2016-11-10 NOTE — Progress Notes (Signed)
   Subjective:    Patient ID: Colton Miranda, male    DOB: 03/31/1941, 76 y.o.   MRN: [id number]  HPI  Last INR on 10/09/16 was OK at 2.5 Current coumadin dose is 15mg  QD except 17.5mg  MWF Denies missed doses. Denies CP, SOB, falls Denies unusual bleeding / bruising. Consistent with vitamin K intake.    Review of Systems  HENT: Negative for nosebleeds.   Respiratory: Negative for shortness of breath.   Cardiovascular: Negative for chest pain.  Gastrointestinal: Negative for anal bleeding and blood in stool.  Genitourinary: Negative for hematuria.  Hematological: Does not bruise/bleed easily.       Objective:   Physical Exam  Constitutional: He appears well-developed and well-nourished.  HENT:  Right Ear: External ear normal.  Left Ear: External ear normal.  Cardiovascular: Regular rhythm and normal heart sounds.  Bradycardia present.   Pulmonary/Chest: Effort normal and breath sounds normal.  Skin: Skin is warm and dry.  Psychiatric: He has a normal mood and affect. His behavior is normal. Judgment and thought content normal.  Vitals reviewed.    INR 2.4 BP: 128/62  HR 51  Wt:  233.4lb     Assessment & Plan:  1.  INR at goal 2-3 2.  Continue Coumadin 15mg  QD except 17.5mg  MWF 3.  RTC in 1 month

## 2016-11-10 NOTE — Patient Instructions (Signed)
INR 2.4 Continue Coumadin 15mg  daily except 17.5mg  on Mondays, Wednesdays, Fridays

## 2016-11-17 DIAGNOSIS — L821 Other seborrheic keratosis: Secondary | ICD-10-CM | POA: Diagnosis not present

## 2016-11-17 DIAGNOSIS — Z85828 Personal history of other malignant neoplasm of skin: Secondary | ICD-10-CM | POA: Diagnosis not present

## 2016-11-17 DIAGNOSIS — D225 Melanocytic nevi of trunk: Secondary | ICD-10-CM | POA: Diagnosis not present

## 2016-11-17 DIAGNOSIS — L218 Other seborrheic dermatitis: Secondary | ICD-10-CM | POA: Diagnosis not present

## 2016-11-17 DIAGNOSIS — D2372 Other benign neoplasm of skin of left lower limb, including hip: Secondary | ICD-10-CM | POA: Diagnosis not present

## 2016-11-26 ENCOUNTER — Encounter: Payer: Self-pay | Admitting: Internal Medicine

## 2016-11-26 ENCOUNTER — Ambulatory Visit (INDEPENDENT_AMBULATORY_CARE_PROVIDER_SITE_OTHER): Payer: PPO | Admitting: Internal Medicine

## 2016-11-26 ENCOUNTER — Ambulatory Visit (INDEPENDENT_AMBULATORY_CARE_PROVIDER_SITE_OTHER): Payer: PPO

## 2016-11-26 VITALS — BP 150/70 | HR 52 | Temp 97.5°F | Ht 66.0 in | Wt 232.0 lb

## 2016-11-26 DIAGNOSIS — Z23 Encounter for immunization: Secondary | ICD-10-CM | POA: Diagnosis not present

## 2016-11-26 DIAGNOSIS — E119 Type 2 diabetes mellitus without complications: Secondary | ICD-10-CM | POA: Diagnosis not present

## 2016-11-26 DIAGNOSIS — E785 Hyperlipidemia, unspecified: Secondary | ICD-10-CM

## 2016-11-26 DIAGNOSIS — D126 Benign neoplasm of colon, unspecified: Secondary | ICD-10-CM

## 2016-11-26 DIAGNOSIS — Z0000 Encounter for general adult medical examination without abnormal findings: Secondary | ICD-10-CM | POA: Diagnosis not present

## 2016-11-26 DIAGNOSIS — K635 Polyp of colon: Secondary | ICD-10-CM | POA: Insufficient documentation

## 2016-11-26 DIAGNOSIS — I10 Essential (primary) hypertension: Secondary | ICD-10-CM

## 2016-11-26 DIAGNOSIS — Z135 Encounter for screening for eye and ear disorders: Secondary | ICD-10-CM | POA: Diagnosis not present

## 2016-11-26 NOTE — Progress Notes (Signed)
Quick Notes   Health Maintenance:  Due for A1C, Foot exam, colonoscopy.     Abnormal Screen: MMSE- 28/30 Passed Clock Test    Patient Concerns:  Pt would like to discuss Cologuard vs Colonoscopy. Pt would like to discuss what kind of fiber supplements he is allowed to take. Pt would also like to discuss his trouble falling asleep and staying asleep.     Nurse Concerns:  None  I have reviewed the information entered by the Health Advisor. I was present in the office during the time of patient interaction and was available for consultation. I agree with the documentation and advice.  Viviann Spare Nyoka Cowden, MD

## 2016-11-26 NOTE — Patient Instructions (Addendum)
Colton Miranda , Thank you for taking time to come for your Medicare Wellness Visit. I appreciate your ongoing commitment to your health goals. Please review the following plan we discussed and let me know if I can assist you in the future.   These are the goals we discussed: Goals    . Weight (lb) < 200 lb (90.7 kg)          Starting 11/26/16, I will attempt to decrease my current weight of 232 lb, to reach my goal weight of 200 lb.        This is a list of the screening recommended for you and due dates:  Health Maintenance  Topic Date Due  . Complete foot exam   11/09/2015  . Hemoglobin A1C  11/20/2016  . Tetanus Vaccine  09/22/2017*  . Eye exam for diabetics  11/21/2018*  . Colon Cancer Screening  11/21/2018*  . Pneumonia vaccines  Completed  . Flu Shot  Excluded  *Topic was postponed. The date shown is not the original due date.  Preventive Care for Adults  A healthy lifestyle and preventive care can promote health and wellness. Preventive health guidelines for adults include the following key practices.  . A routine yearly physical is a good way to check with your health care provider about your health and preventive screening. It is a chance to share any concerns and updates on your health and to receive a thorough exam.  . Visit your dentist for a routine exam and preventive care every 6 months. Brush your teeth twice a day and floss once a day. Good oral hygiene prevents tooth decay and gum disease.  . The frequency of eye exams is based on your age, health, family medical history, use  of contact lenses, and other factors. Follow your health care provider's ecommendations for frequency of eye exams.  . Eat a healthy diet. Foods like vegetables, fruits, whole grains, low-fat dairy products, and lean protein foods contain the nutrients you need without too many calories. Decrease your intake of foods high in solid fats, added sugars, and salt. Eat the right amount of calories  for you. Get information about a proper diet from your health care provider, if necessary.  . Regular physical exercise is one of the most important things you can do for your health. Most adults should get at least 150 minutes of moderate-intensity exercise (any activity that increases your heart rate and causes you to sweat) each week. In addition, most adults need muscle-strengthening exercises on 2 or more days a week.  Silver Sneakers may be a benefit available to you. To determine eligibility, you may visit the website: www.silversneakers.com or contact program at 6405931565 Mon-Fri between 8AM-8PM.   . Maintain a healthy weight. The body mass index (BMI) is a screening tool to identify possible weight problems. It provides an estimate of body fat based on height and weight. Your health care provider can find your BMI and can help you achieve or maintain a healthy weight.   For adults 20 years and older: ? A BMI below 18.5 is considered underweight. ? A BMI of 18.5 to 24.9 is normal. ? A BMI of 25 to 29.9 is considered overweight. ? A BMI of 30 and above is considered obese.   . Maintain normal blood lipids and cholesterol levels by exercising and minimizing your intake of saturated fat. Eat a balanced diet with plenty of fruit and vegetables. Blood tests for lipids and cholesterol should begin at  age 64 and be repeated every 5 years. If your lipid or cholesterol levels are high, you are over 50, or you are at high risk for heart disease, you may need your cholesterol levels checked more frequently. Ongoing high lipid and cholesterol levels should be treated with medicines if diet and exercise are not working.  . If you smoke, find out from your health care provider how to quit. If you do not use tobacco, please do not start.  . If you choose to drink alcohol, please do not consume more than 2 drinks per day. One drink is considered to be 12 ounces (355 mL) of beer, 5 ounces (148 mL) of  wine, or 1.5 ounces (44 mL) of liquor.  . If you are 34-26 years old, ask your health care provider if you should take aspirin to prevent strokes.  . Use sunscreen. Apply sunscreen liberally and repeatedly throughout the day. You should seek shade when your shadow is shorter than you. Protect yourself by wearing long sleeves, pants, a wide-brimmed hat, and sunglasses year round, whenever you are outdoors.  . Once a month, do a whole body skin exam, using a mirror to look at the skin on your back. Tell your health care provider of new moles, moles that have irregular borders, moles that are larger than a pencil eraser, or moles that have changed in shape or color.

## 2016-11-26 NOTE — Progress Notes (Signed)
Facility  Col[facility]    Place of Service:   OFFICE    No Known Allergies  Chief Complaint  Patient presents with  . Medical Management of Chronic Issues    6 month medication management blood pressure, blood sugar, cholesterol  . Constipation    takes one stool softern at night, would like to talk about fiber, can't eat green vegetables because he is on Coumadin. Due for colonoscopy, would like to talk about cologuard. However, mother had colon cancer.   . Insomnia    has trouble falling asleep, taking Advil PM every other night. Would like to talk about this.     HPI:  Type 2 diabetes mellitus without complication, without long-term current use of insulin ([place]) - controlled. Needs ophth referral.  Essential hypertension - controlled  Hyperlipidemia, unspecified hyperlipidemia type - controlled  Adenomatous polyp of colon, unspecified part of colon - last colonoscopy about 10 years ago. 13 benign polyps removed. Needs referral to Gastroenterology.  Medications: Patient's Medications  New Prescriptions   No medications on file  Previous Medications   BLOOD GLUCOSE MONITORING SUPPL (ONETOUCH VERIO) W/DEVICE KIT    by Does not apply route. Check blood sugar twice daily as directed DX E11.65   CHOLECALCIFEROL (VITAMIN D) 2000 UNITS CAPS    Take 1 capsule by mouth daily.     GLUCOSE BLOOD (ONETOUCH VERIO) TEST STRIP    Check blood sugar twice daily as directed E11.65   IBUPROFEN-DIPHENHYDRAMINE CIT (ADVIL PM PO)    Take 1 tablet by mouth at bedtime as needed.   LISINOPRIL (PRINIVIL,ZESTRIL) 20 MG TABLET    TAKE ONE TABLET BY MOUTH ONCE DAILY FOR  BLOOD  PRESSURE   METFORMIN (GLUCOPHAGE) 500 MG TABLET    TAKE ONE TABLET BY MOUTH ONCE DAILY IN THE MORNING FOR  BLOOD  SUGAR  CONTROL   MISC NATURAL PRODUCTS (OSTEO BI-FLEX ADV TRIPLE ST PO)    Take by mouth daily.   NYSTATIN (MYCOSTATIN/NYSTOP) POWDER    100,000 g daily as needed. Apply to groin area for yeast/fungal infection   ONETOUCH  DELICA LANCETS 31Y MISC    by Does not apply route. Check blood sugar twice daily E11.65   SIMVASTATIN (ZOCOR) 20 MG TABLET    TAKE ONE TABLET BY MOUTH ONCE DAILY FOR CHOLESTEROL   TRAMADOL (ULTRAM) 50 MG TABLET    TAKE ONE TABLET BY MOUTH 4 TIMES DAILY AS NEEDED FOR PAIN   WARFARIN (COUMADIN) 10 MG TABLET    Take one tablet  by mouth daily along with a 5 mg tablet for a total of 15 mg  EXCEPT on M/W/F take 17.5 mg   WARFARIN (COUMADIN) 5 MG TABLET    5 mg. 1  by mouth daily along with a 10 mg tablet for a total of 15 mg  EXCEPT on M/W/F take 17.5 mg  Modified Medications   No medications on file  Discontinued Medications   No medications on file    Review of Systems  Constitutional: Negative for activity change, appetite change and chills.  HENT: Positive for hearing loss and tinnitus. Negative for ear pain and nosebleeds.   Eyes:       Corrective lenses  Respiratory: Negative for chest tightness and shortness of breath.   Cardiovascular: Positive for leg swelling. Negative for chest pain and palpitations.  Gastrointestinal: Negative for anal bleeding and blood in stool.  Endocrine:       Diabetic  Genitourinary: Negative for hematuria.  Nocturia x 4. Some urgency. Normal stream.  Musculoskeletal: Positive for arthralgias (shoulder pains) and back pain.  Skin: Negative.   Neurological: Negative for dizziness, tremors, seizures, syncope, facial asymmetry, speech difficulty, weakness, light-headedness, numbness and headaches.       Previous problems with paresthesias and discomfort in the left arm have not recurred in the last few months.  Hematological: Negative.        History of hypercoagulable state.  Psychiatric/Behavioral:       Problems with insomnia. Wakes at night. He is up at 4 AM.    Vitals:   11/26/16 1524  BP: (!) 150/70  Pulse: (!) 52  Temp: 97.5 F (36.4 C)  TempSrc: Oral  SpO2: 97%  Weight: 232 lb (105.2 kg)  Height: 5' 6"  (1.676 m)   Body mass index is  37.45 kg/m. Wt Readings from Last 3 Encounters:  11/26/16 232 lb (105.2 kg)  11/26/16 232 lb (105.2 kg)  11/10/16 233 lb (105.7 kg)      Physical Exam  Constitutional: He is oriented to person, place, and time.  Obese  HENT:  History of cerumen impactions  Eyes:   Corrective lenses  Neck: Normal range of motion. Neck supple. No JVD present. No tracheal deviation present. No thyromegaly present.  Cardiovascular: Normal rate, regular rhythm and normal heart sounds.  Exam reveals no gallop and no friction rub.   No murmur heard. Pulmonary/Chest: Effort normal and breath sounds normal. No respiratory distress. He has no wheezes. He has no rales.  Abdominal: Soft. Bowel sounds are normal. He exhibits no distension and no mass. There is no tenderness.  Musculoskeletal:  Back discomfort to palpation. 3+ bipedal edema. No calf tenderness. Reduced ability to raise left arm at the shoulder. Wobbly gait. Using left knee brace with Velcro closures.  Lymphadenopathy:    He has no cervical adenopathy.  Neurological: He is alert and oriented to person, place, and time. No cranial nerve deficit.  Skin: Skin is warm and dry. No rash noted. No erythema. No pallor.  Psychiatric: He has a normal mood and affect. His behavior is normal. Thought content normal.    Labs reviewed: Lab Summary Latest Ref Rng & Units 05/23/2016 01/23/2016  Hemoglobin 13.0-17.0 g/dL (None) (None)  Hematocrit 39.0-52.0 % (None) (None)  White count - (None) (None)  Platelet count - (None) (None)  Sodium 135 - 146 mmol/L 134(L) 137  Potassium 3.5 - 5.3 mmol/L 4.8 5.1  Calcium 8.6 - 10.3 mg/dL 9.4 9.6  Phosphorus - (None) (None)  Creatinine 0.70 - 1.18 mg/dL 1.12 1.00  AST 10 - 35 U/L 23 (None)  Alk Phos 40 - 115 U/L 57 (None)  Bilirubin 0.2 - 1.2 mg/dL 0.6 (None)  Glucose 65 - 99 mg/dL 95 90  Cholesterol 125 - 200 mg/dL 104(L) (None)  HDL cholesterol >=40 mg/dL 43 40  Triglycerides <150 mg/dL 89 103  LDL Direct -  (None) (None)  LDL Calc <130 mg/dL 43 45  Total protein 6.1 - 8.1 g/dL 6.9 (None)  Albumin 3.6 - 5.1 g/dL 4.3 (None)  Some recent data might be hidden   No results found for: TSH, T3TOTAL, T4TOTAL, THYROIDAB Lab Results  Component Value Date   BUN 16 05/23/2016   BUN 21 01/23/2016   BUN 21 07/23/2015   Lab Results  Component Value Date   HGBA1C 6.1 (H) 05/23/2016   HGBA1C 6.7 (H) 01/23/2016   HGBA1C 6.7 (H) 07/23/2015    Assessment/Plan  1. Type 2 diabetes mellitus  without complication, without long-term current use of insulin ([place]) - ophth referral  2. Essential hypertension The current medical regimen is effective;  continue present plan and medications.  3. Hyperlipidemia, unspecified hyperlipidemia type Recheck in future  4. Adenomatous polyp of colon, unspecified part of colon - Ambulatory referral to Gastroenterology

## 2016-11-26 NOTE — Progress Notes (Signed)
Subjective:   Colton Miranda is a 76 y.o. male who presents for an Initial Medicare Annual Wellness Visit.  Review of Systems  Cardiac Risk Factors include: diabetes mellitus;family history of premature cardiovascular disease;hypertension;obesity (BMI >30kg/m2);sedentary lifestyle;smoking/ tobacco exposure;male gender;advanced age (>32mn, >>20women)    Objective:    Today's Vitals   11/26/16 1450  BP: (!) 150/70  Pulse: (!) 52  Temp: 97.5 F (36.4 C)  TempSrc: Oral  SpO2: 97%  Weight: 232 lb (105.2 kg)  Height: 5' 6"  (1.676 m)   Body mass index is 37.45 kg/m.  Current Medications (verified) Outpatient Encounter Prescriptions as of 11/26/2016  Medication Sig  . Blood Glucose Monitoring Suppl (ONETOUCH VERIO) w/Device KIT by Does not apply route. Check blood sugar twice daily as directed DX E11.65  . Cholecalciferol (VITAMIN D) 2000 UNITS CAPS Take 1 capsule by mouth daily.    .Marland Kitchenglucose blood (ONETOUCH VERIO) test strip Check blood sugar twice daily as directed E11.65  . Ibuprofen-Diphenhydramine Cit (ADVIL PM PO) Take 1 tablet by mouth at bedtime as needed.  .Marland Kitchenlisinopril (PRINIVIL,ZESTRIL) 20 MG tablet TAKE ONE TABLET BY MOUTH ONCE DAILY FOR  BLOOD  PRESSURE  . metFORMIN (GLUCOPHAGE) 500 MG tablet TAKE ONE TABLET BY MOUTH ONCE DAILY IN THE MORNING FOR  BLOOD  SUGAR  CONTROL  . Misc Natural Products (OSTEO BI-FLEX ADV TRIPLE ST PO) Take by mouth daily.  .Marland Kitchennystatin (MYCOSTATIN/NYSTOP) powder 100,000 g daily as needed. Apply to groin area for yeast/fungal infection  . ONETOUCH DELICA LANCETS 328MMISC by Does not apply route. Check blood sugar twice daily E11.65  . simvastatin (ZOCOR) 20 MG tablet TAKE ONE TABLET BY MOUTH ONCE DAILY FOR CHOLESTEROL  . traMADol (ULTRAM) 50 MG tablet TAKE ONE TABLET BY MOUTH 4 TIMES DAILY AS NEEDED FOR PAIN  . warfarin (COUMADIN) 10 MG tablet Take one tablet  by mouth daily along with a 5 mg tablet for a total of 15 mg  EXCEPT on M/W/F take 17.5  mg  . warfarin (COUMADIN) 5 MG tablet 5 mg. 1  by mouth daily along with a 10 mg tablet for a total of 15 mg  EXCEPT on M/W/F take 17.5 mg   No facility-administered encounter medications on file as of 11/26/2016.     Allergies (verified) Patient has no known allergies.   History: Past Medical History:  Diagnosis Date  . Acute, but ill-defined, cerebrovascular disease   . Basal cell carcinoma of skin of trunk, except scrotum   . Chronic airway obstruction, not elsewhere classified   . Diabetes mellitus 10/2010   T2DM, new dx   . Disturbance of skin sensation   . Edema   . Epistaxis   . Epistaxis   . 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 History of fall 07/25/2015  . Hypertension   . Hypertrophy of prostate with urinary obstruction and other lower urinary tract symptoms (LUTS)   . Impacted cerumen 02/23/13  . Long term (current) use of anticoagulants   . Memory loss   . Myalgia and myositis, unspecified   . Nonspecific abnormal electrocardiogram (ECG) (EKG)   . Obesity (BMI 30-39.9)   . Obesity, unspecified   . Osteoarthrosis, unspecified whether generalized or localized, unspecified site   . Other and unspecified disc disorder of lumbar region   . Other and unspecified hyperlipidemia   . Other pulmonary embolism and infarction 1988  . Pain in joint, lower leg   . Pain in limb   .  Palpitations   . Primary hypercoagulable state ([place])   . Right bundle branch block   . Seborrheic dermatitis, unspecified   . Stiffness of joint, not elsewhere classified, unspecified site   . Stroke ([place])   . Tobacco use disorder   . Type II or unspecified type diabetes mellitus without mention of complication, not stated as uncontrolled   . Type II or unspecified type diabetes mellitus without mention of complication, uncontrolled   . Undiagnosed cardiac murmurs   . Unspecified constipation   . Unspecified late effects of cerebrovascular disease   . Unspecified vitamin D deficiency   .  Urinary frequency    Past Surgical History:  Procedure Laterality Date  . CHOLECYSTECTOMY  1981  . COLONOSCOPY  2009  . PARTIAL HIP ARTHROPLASTY Right 06/18/2009   Dr. Delilah Shan  . PILONIDAL CYST EXCISION  1968   base of spine   Family History  Problem Relation Age of Onset  . Cancer Mother 87    colon , breast  . Heart attack Daughter     2008  . Parkinson's disease Sister    Social History   Occupational History  . Not on file.   Social History Main Topics  . Smoking status: Former Smoker    Quit date: 01/03/2009  . Smokeless tobacco: Never Used  . Alcohol use 1.8 oz/week    3 Cans of beer per week     Comment: week  . Drug use: No  . Sexual activity: Not Currently    Partners: Female     Comment: wife   Tobacco Counseling Counseling given: No   Activities of Daily Living In your present state of health, do you have any difficulty performing the following activities: 11/26/2016  Hearing? Y  Vision? Y  Difficulty concentrating or making decisions? Y  Walking or climbing stairs? Y  Dressing or bathing? N  Doing errands, shopping? N  Preparing Food and eating ? N  Using the Toilet? N  In the past six months, have you accidently leaked urine? Y  Do you have problems with loss of bowel control? N  Managing your Medications? N  Managing your Finances? N  Housekeeping or managing your Housekeeping? N  Some recent data might be hidden    Immunizations and Health Maintenance Immunization History  Administered Date(s) Administered  . Pneumococcal Conjugate-13 11/08/2014  . Pneumococcal Polysaccharide-23 11/26/2016  . Pneumococcal-Unspecified 09/22/2005   Health Maintenance Due  Topic Date Due  . FOOT EXAM  11/09/2015  . HEMOGLOBIN A1C  11/20/2016    Patient Care Team: Estill Dooms, MD as PCP - General (Internal Medicine) [name], MD as Consulting Physician (Orthopedic Surgery) [name], MD as Consulting Physician (Cardiology)  Indicate any  recent Medical Services you may have received from other than [facility] providers in the past year (date may be approximate).    Assessment:   This is a routine wellness examination for Colton Miranda.  Hearing/Vision screen  Hearing Screening   125Hz  250Hz  500Hz  1000Hz  2000Hz  3000Hz  4000Hz  6000Hz  8000Hz   Right ear:   100 100 0 0 100    Left ear:   100 100 100 0 0    Comments: Last hearing screen was done 5 years ago. Pt has noticed a decrease in hearing and does have tinnitus but does not want a referral to audiology at this time.    Visual Acuity Screening   Right eye Left eye Both eyes  Without correction:     With correction: 20/40  20/40-4 20/40-1  Comments: Last eye exam was done several years ago. Pt has noticed a decrease in vision. Pt would like a referral for ophthalmology.    Dietary issues and exercise activities discussed: Current Exercise Habits: The patient does not participate in regular exercise at present, Exercise limited by: cardiac condition(s)  Goals    . Weight (lb) < 200 lb (90.7 kg)          Starting 11/26/16, I will attempt to decrease my current weight of 232 lb, to reach my goal weight of 200 lb.       Depression Screen PHQ 2/9 Scores 11/26/2016 05/28/2016 07/25/2015 04/02/2015  PHQ - 2 Score 0 0 0 0    Fall Risk Fall Risk  11/26/2016 11/10/2016 07/14/2016 06/02/2016 05/28/2016  Falls in the past year? No No Yes Yes Yes  Number falls in past yr: - - 1 1 -  Injury with Fall? - - - No No  Risk Factor Category  - - - - -  Risk for fall due to : History of fall(s);Impaired balance/gait - - - -  Follow up - - - - -    Cognitive Function: MMSE - Mini Mental State Exam 11/26/2016  Orientation to time 5  Orientation to Place 5  Registration 3  Attention/ Calculation 5  Recall 2  Language- name 2 objects 2  Language- repeat 1  Language- follow 3 step command 2  Language- read & follow direction 1  Write a sentence 1  Copy design 1  Total score 28        Screening  Tests Health Maintenance  Topic Date Due  . FOOT EXAM  11/09/2015  . HEMOGLOBIN A1C  11/20/2016  . TETANUS/TDAP  09/22/2017 (Originally 03/25/1960)  . OPHTHALMOLOGY EXAM  11/21/2018 (Originally 03/26/1951)  . COLONOSCOPY  11/21/2018 (Originally 09/23/2015)  . PNA vac Low Risk Adult  Completed  . INFLUENZA VACCINE  Excluded        Plan:    I have personally reviewed and addressed the Medicare Annual Wellness questionnaire and have noted the following in the patient's chart:  A. Medical and social history B. Use of alcohol, tobacco or illicit drugs  C. Current medications and supplements D. Functional ability and status E.  Nutritional status F.  Physical activity G. Advance directives H. List of other physicians I.  Hospitalizations, surgeries, and ER visits in previous 12 months J.  [place] to include hearing, vision, cognitive, depression L. Referrals and appointments - none  In addition, I have reviewed and discussed with patient certain preventive protocols, quality metrics, and best practice recommendations. A written personalized care plan for preventive services as well as general preventive health recommendations were provided to patient.  See attached scanned questionnaire for additional information.   Signed,   Allyn Kenner, LPN Health Advisor    I have reviewed the information entered by the Health Advisor. I was present in the office during the time of patient interaction and was available for consultation. I agree with the documentation and advice.  Viviann Spare Nyoka Cowden, MD

## 2016-12-02 ENCOUNTER — Telehealth: Payer: Self-pay | Admitting: *Deleted

## 2016-12-02 DIAGNOSIS — S20212A Contusion of left front wall of thorax, initial encounter: Secondary | ICD-10-CM | POA: Diagnosis not present

## 2016-12-02 DIAGNOSIS — S81002A Unspecified open wound, left knee, initial encounter: Secondary | ICD-10-CM | POA: Diagnosis not present

## 2016-12-02 NOTE — Telephone Encounter (Signed)
Patient wife called and left message on Clinical Intake Line stating that patient fell today and hurt his knee and finger and wanted to schedule an appointment for him to be evaluated.  I called back and had to [facility] to return call.

## 2016-12-04 NOTE — Telephone Encounter (Signed)
Left message on voicemail for patient to return call when available    Reason for call- we have several available appointment's for today, I was calling to schedule patient for a follow-up from fall

## 2016-12-22 ENCOUNTER — Ambulatory Visit (INDEPENDENT_AMBULATORY_CARE_PROVIDER_SITE_OTHER): Payer: PPO | Admitting: Pharmacotherapy

## 2016-12-22 ENCOUNTER — Encounter: Payer: Self-pay | Admitting: Pharmacotherapy

## 2016-12-22 VITALS — BP 114/62 | HR 58 | Temp 97.7°F | Ht 66.0 in | Wt 231.4 lb

## 2016-12-22 DIAGNOSIS — Z7901 Long term (current) use of anticoagulants: Secondary | ICD-10-CM

## 2016-12-22 DIAGNOSIS — D6859 Other primary thrombophilia: Secondary | ICD-10-CM

## 2016-12-22 LAB — POCT INR: INR: 2.5

## 2016-12-22 NOTE — Progress Notes (Signed)
   Subjective:    Patient ID: Colton Miranda, male    DOB: [birth year], 76 y.o.   MRN: [id number]  HPI Last INR on 11/10/16 was OK at 2.4 Current Coumadin dose is 15mg  QD except 17.5mg  MWF Denies CP, SOB. Did have a fall 2 weeks ago in wet grass.  Went to [facility] - just bruising to left side. Denies bleeding Consistent with vitamin K intake.  Average BG: 116mg /dl No hypoglycemia   Review of Systems  HENT: Negative for nosebleeds.   Respiratory: Negative for shortness of breath.   Cardiovascular: Negative for chest pain.  Gastrointestinal: Negative for anal bleeding and blood in stool.  Genitourinary: Negative for hematuria.  Hematological: Does not bruise/bleed easily.       Objective:   Physical Exam  Constitutional: He is oriented to person, place, and time. He appears well-developed and well-nourished.  HENT:  Right Ear: External ear normal.  Left Ear: External ear normal.  Cardiovascular: Normal rate, regular rhythm and normal heart sounds.   Pulmonary/Chest: Effort normal and breath sounds normal.  Neurological: He is alert and oriented to person, place, and time.  Skin: Skin is warm and dry.  Psychiatric: He has a normal mood and affect. His behavior is normal. Judgment and thought content normal.  Vitals reviewed.  BP: 114/62  HR: 58  Wt: 231.4lb INR 2.5       Assessment & Plan:  1.  INR at goal 2-3 2.  Continue Coumadin 15mg  QD except 17.5mg  MWF 3.  RTC in 1 month

## 2016-12-22 NOTE — Patient Instructions (Signed)
INR 2.5 Continue Coumadin 15mg  daily except 17.5mg  Mondays, Wednesdays, and Fridays

## 2016-12-23 ENCOUNTER — Other Ambulatory Visit: Payer: Self-pay | Admitting: Internal Medicine

## 2016-12-23 DIAGNOSIS — E119 Type 2 diabetes mellitus without complications: Secondary | ICD-10-CM | POA: Diagnosis not present

## 2016-12-23 DIAGNOSIS — H04123 Dry eye syndrome of bilateral lacrimal glands: Secondary | ICD-10-CM | POA: Diagnosis not present

## 2016-12-23 DIAGNOSIS — H2513 Age-related nuclear cataract, bilateral: Secondary | ICD-10-CM | POA: Diagnosis not present

## 2017-01-02 ENCOUNTER — Encounter: Payer: Self-pay | Admitting: Nurse Practitioner

## 2017-01-12 ENCOUNTER — Encounter: Payer: Self-pay | Admitting: Nurse Practitioner

## 2017-01-12 ENCOUNTER — Telehealth: Payer: Self-pay

## 2017-01-12 ENCOUNTER — Ambulatory Visit (INDEPENDENT_AMBULATORY_CARE_PROVIDER_SITE_OTHER): Payer: PPO | Admitting: Nurse Practitioner

## 2017-01-12 VITALS — BP 100/62 | HR 48 | Ht 64.5 in | Wt 236.6 lb

## 2017-01-12 DIAGNOSIS — Z1211 Encounter for screening for malignant neoplasm of colon: Secondary | ICD-10-CM

## 2017-01-12 DIAGNOSIS — K5909 Other constipation: Secondary | ICD-10-CM | POA: Diagnosis not present

## 2017-01-12 MED ORDER — NA SULFATE-K SULFATE-MG SULF 17.5-3.13-1.6 GM/177ML PO SOLN: 1.0000 | Freq: Once | ORAL | 0 refills | Status: AC

## 2017-01-12 NOTE — Telephone Encounter (Signed)
Colton Miranda may be off his warfarin for 5 days prior to the colonoscopy.

## 2017-01-12 NOTE — Telephone Encounter (Signed)
   Colton Miranda Jul 19, 1941 [id number]  Dear Dr. Nyoka Cowden:  We have scheduled the above named patient for a colonoscopy procedure. Our records show that (s)he is on anticoagulation therapy.  Please advise as to whether the patient may come off their therapy of Coumadin  5 days prior to their procedure which is scheduled for 01/28/17.  Please route your response to Thurmon Fair, RMA or fax response to [phone].  Sincerely,    [facility]

## 2017-01-12 NOTE — Patient Instructions (Signed)
If you are age 76 or older, your body mass index should be between 23-30. Your Body mass index is 39.99 kg/m. If this is out of the aforementioned range listed, please consider follow up with your Primary Care Provider.  If you are age 76 or younger, your body mass index should be between 19-25. Your Body mass index is 39.99 kg/m. If this is out of the aformentioned range listed, please consider follow up with your Primary Care Provider.   You have been scheduled for a colonoscopy. Please follow written instructions given to you at your visit today.  Please pick up your prep supplies at the pharmacy within the next 1-3 days. If you use inhalers (even only as needed), please bring them with you on the day of your procedure. Your physician has requested that you go to www.startemmi.com and enter the access code given to you at your visit today. This web site gives a general overview about your procedure. However, you should still follow specific instructions given to you by our office regarding your preparation for the procedure.  You will be contacted by our office prior to your procedure for directions on holding your Coumadin.  If you do not hear from our office 1 week prior to your scheduled procedure, please call [phone] to discuss.    Thank you for choosing me and [facility].  Tye Savoy, NP

## 2017-01-12 NOTE — Progress Notes (Addendum)
HPI:  Patient is a 76 year old male, new to this practice, referred by Dr. Jeanmarie Hubert for colon cancer screening. Patient is on chronic Coumadin for hypercoagulable state and history of DVT/CVA. Patient states he had a colonoscopy in [place] in 2009 at which time multiple "benign" polyps were removed. Patient has chronic constipation which he attributes to medications. He has taken a stool softener plus laxative for a year but only after the recent addition of fiber have his stools become softer. He has no other GI complaints. No general medical complaints. No chest pains or shortness of breath   Past Medical History:  Diagnosis Date  . Acute, but ill-defined, cerebrovascular disease   . Basal cell carcinoma of skin of trunk, except scrotum   . Chronic airway obstruction, not elsewhere classified   . Diabetes mellitus 10/2010   T2DM, new dx   . Disturbance of skin sensation   . Epistaxis   . 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 History of fall 07/25/2015  . Hypertension   . Hypertrophy of prostate with urinary obstruction and other lower urinary tract symptoms (LUTS)   . Impacted cerumen 02/23/13  . Long term (current) use of anticoagulants   . Memory loss   . Myalgia and myositis, unspecified   . Nonspecific abnormal electrocardiogram (ECG) (EKG)   . Obesity (BMI 30-39.9)   . Osteoarthrosis, unspecified whether generalized or localized, unspecified site   . Other and unspecified disc disorder of lumbar region   . Other and unspecified hyperlipidemia   . Other pulmonary embolism and infarction 1988  . Primary hypercoagulable state ([place])   . Right bundle branch block   . Seborrheic dermatitis, unspecified   . Stiffness of joint, not elsewhere classified, unspecified site   . Stroke ([place])   . Tobacco use disorder   . Type II or unspecified type diabetes mellitus without mention of complication, not stated as uncontrolled   . Type II or unspecified type diabetes  mellitus without mention of complication, uncontrolled   . Undiagnosed cardiac murmurs   . Unspecified constipation   . Unspecified late effects of cerebrovascular disease   . Unspecified vitamin D deficiency      Past Surgical History:  Procedure Laterality Date  . CHOLECYSTECTOMY  1981  . COLONOSCOPY  2009  . PARTIAL HIP ARTHROPLASTY Right 06/18/2009   Dr. Delilah Shan  . PILONIDAL CYST EXCISION  1968   base of spine   Family History  Problem Relation Age of Onset  . Cancer Mother 16    colon , breast  . Heart attack Daughter     2008  . Parkinson's disease Sister    Social History  Substance Use Topics  . Smoking status: Former Smoker    Quit date: 01/03/2009  . Smokeless tobacco: Never Used  . Alcohol use 1.8 oz/week    3 Cans of beer per week     Comment: week   Current Outpatient Prescriptions  Medication Sig Dispense Refill  . Blood Glucose Monitoring Suppl (ONETOUCH VERIO) w/Device KIT by Does not apply route. Check blood sugar twice daily as directed DX E11.65    . Cholecalciferol (VITAMIN D) 2000 UNITS CAPS Take 1 capsule by mouth daily.      Marland Kitchen glucose blood (ONETOUCH VERIO) test strip Check blood sugar twice daily as directed E11.65 100 each 11  . HYDROcodone-acetaminophen (NORCO/VICODIN) 5-325 MG tablet 1 by mouth daily as needed, rx'ed by Urgent Care    .  Ibuprofen-Diphenhydramine Cit (ADVIL PM PO) Take 1 tablet by mouth at bedtime as needed.    Marland Kitchen lisinopril (PRINIVIL,ZESTRIL) 20 MG tablet TAKE ONE TABLET BY MOUTH ONCE DAILY FOR  BLOOD  PRESSURE 90 tablet 1  . metFORMIN (GLUCOPHAGE) 500 MG tablet TAKE ONE TABLET BY MOUTH ONCE DAILY IN THE MORNING FOR  BLOOD  SUGAR  CONTROL 90 tablet 1  . Misc Natural Products (OSTEO BI-FLEX ADV TRIPLE ST PO) Take by mouth daily.    . NON FORMULARY Fiber powder - one scoop every other day    . nystatin (MYCOSTATIN/NYSTOP) powder 100,000 g daily as needed. Apply to groin area for yeast/fungal infection    . ONETOUCH DELICA LANCETS  28U MISC by Does not apply route. Check blood sugar twice daily E11.65    . simvastatin (ZOCOR) 20 MG tablet TAKE ONE TABLET BY MOUTH ONCE DAILY FOR CHOLESTEROL 90 tablet 1  . traMADol (ULTRAM) 50 MG tablet TAKE ONE TABLET BY MOUTH 4 TIMES DAILY AS NEEDED FOR PAIN 120 tablet 0  . triamcinolone lotion (KENALOG) 0.1 % Use once daily as needed for skin irritation behind left ear    . warfarin (COUMADIN) 10 MG tablet Take one tablet  by mouth daily along with a 5 mg tablet for a total of 15 mg  EXCEPT on M/W/F take 17.5 mg 90 tablet 1  . warfarin (COUMADIN) 5 MG tablet 5 mg. 1  by mouth daily along with a 10 mg tablet for a total of 15 mg  EXCEPT on M/W/F take 17.5 mg     No current facility-administered medications for this visit.    No Known Allergies   Review of Systems: All systems reviewed and negative except where noted in HPI.   Physical Exam: BP 100/62   Pulse (!) 48   Ht 5' 4.5" (1.638 m)   Wt 236 lb 9.6 oz (107.3 kg)   BMI 39.99 kg/m  Constitutional:  Obese white male in no acute distress here with wife. Psychiatric: Normal mood and affect. Behavior is normal. EENT:  Pupils equal. Conjunctivae are normal. No scleral icterus. Neck supple.  Cardiovascular: Normal rate, regular rhythm.  Pulmonary/chest: Effort normal and breath sounds normal. No wheezing, rales or rhonchi. Abdominal: Soft, nondistended, nontender. Bowel sounds active throughout. There are no masses palpable. No hepatomegaly. Extremities: no edema Lymphadenopathy: No cervical adenopathy noted. Neurological: Alert and oriented to person place and time. Skin: Skin is warm and dry. No rashes noted.   ASSESSMENT AND PLAN:  1. 76 yo male due for colon cancer screening. Hx of multiple polyps in [place] in 2009 (benign per patient). Patient will be scheduled for a colonoscopy with possible polypectomy.  The risks and benefits of the procedure were discussed and the patient agrees to proceed. Coumadin will need to  be held for procedure. Given hypercoagulable state he may require lovenox bridge but will defer to PCP, Dr. Nyoka Cowden.   2. Normoctyic anemia, hgb stable around 10 since 2010.   3. Hypercoagulable state, on chronic coumadin. Hold coumadin 5 days before procedure - will instruct when and how to resume after procedure. Patient understands that there is a low but real risk of recurrent DVT / embolism / CVA off coumadin and he agrees to proceed. He may need Lovenox bridge but will defer to PCP. Will communicate by phone or EMR with patient's prescribing provider to confirm that holding coumadin is reasonable in this case.   4. DM2, on Metformin   Tye Savoy, NP  01/12/2017, 2:37 PM  Cc: Estill Dooms, MD   Addendum: Colonoscopy and path report requested twice from Janit Bern,  MD but records never received.

## 2017-01-13 ENCOUNTER — Encounter: Payer: Self-pay | Admitting: Gastroenterology

## 2017-01-13 NOTE — Progress Notes (Signed)
Reviewed and agree with documentation and assessment and plan. K. Veena Plez Belton , MD   

## 2017-01-14 ENCOUNTER — Telehealth: Payer: Self-pay | Admitting: *Deleted

## 2017-01-14 NOTE — Telephone Encounter (Signed)
If there is no bleeding after the procedure, he can resume the warfarin on the day after the colonoscopy.

## 2017-01-14 NOTE — Telephone Encounter (Signed)
Called and spoke with patients wife.  She will advise patient that per Dr. Nyoka Cowden, its okay to hold Warfarin 5 days prior to procedure.

## 2017-01-14 NOTE — Telephone Encounter (Signed)
Patient wife called and stated that patient has an appointment for a Colonoscopy at [facility] on 5/9. She stated that he is suppose to STOP the Warfarin 5 days prior to the Colonoscopy but wants to know when he should start it back. Please Advise.

## 2017-01-16 NOTE — Telephone Encounter (Signed)
Patient wife notified and agreed.  

## 2017-01-28 ENCOUNTER — Ambulatory Visit (AMBULATORY_SURGERY_CENTER): Payer: PPO | Admitting: Gastroenterology

## 2017-01-28 ENCOUNTER — Encounter: Payer: Self-pay | Admitting: Gastroenterology

## 2017-01-28 VITALS — BP 127/50 | HR 47 | Temp 98.2°F | Resp 19 | Ht 64.5 in | Wt 236.0 lb

## 2017-01-28 DIAGNOSIS — Z1211 Encounter for screening for malignant neoplasm of colon: Secondary | ICD-10-CM

## 2017-01-28 DIAGNOSIS — D125 Benign neoplasm of sigmoid colon: Secondary | ICD-10-CM | POA: Diagnosis not present

## 2017-01-28 DIAGNOSIS — Z86010 Personal history of colonic polyps: Secondary | ICD-10-CM

## 2017-01-28 DIAGNOSIS — K635 Polyp of colon: Secondary | ICD-10-CM

## 2017-01-28 DIAGNOSIS — D123 Benign neoplasm of transverse colon: Secondary | ICD-10-CM

## 2017-01-28 DIAGNOSIS — Z800 Family history of malignant neoplasm of digestive organs: Secondary | ICD-10-CM | POA: Diagnosis not present

## 2017-01-28 DIAGNOSIS — I10 Essential (primary) hypertension: Secondary | ICD-10-CM | POA: Diagnosis not present

## 2017-01-28 MED ORDER — SODIUM CHLORIDE 0.9 % IV SOLN: 500.0000 mL | INTRAVENOUS | Status: DC

## 2017-01-28 NOTE — Patient Instructions (Addendum)
YOU HAD AN ENDOSCOPIC PROCEDURE TODAY AT [facility]:   Refer to the procedure report that was given to you for any specific questions about what was found during the examination.  If the procedure report does not answer your questions, please call your gastroenterologist to clarify.  If you requested that your care partner not be given the details of your procedure findings, then the procedure report has been included in a sealed envelope for you to review at your convenience later.  YOU SHOULD EXPECT: Some feelings of bloating in the abdomen. Passage of more gas than usual.  Walking can help get rid of the air that was put into your GI tract during the procedure and reduce the bloating. If you had a lower endoscopy (such as a colonoscopy or flexible sigmoidoscopy) you may notice spotting of blood in your stool or on the toilet paper. If you underwent a bowel prep for your procedure, you may not have a normal bowel movement for a few days.  Please Note:  You might notice some irritation and congestion in your nose or some drainage.  This is from the oxygen used during your procedure.  There is no need for concern and it should clear up in a day or so.  SYMPTOMS TO REPORT IMMEDIATELY:   Following lower endoscopy (colonoscopy or flexible sigmoidoscopy):  Excessive amounts of blood in the stool  Significant tenderness or worsening of abdominal pains  Swelling of the abdomen that is new, acute  Fever of 100F or higher    For urgent or emergent issues, a gastroenterologist can be reached at any hour by calling [phone].   DIET:  We do recommend a small meal at first, but then you may proceed to your regular diet.  Drink plenty of fluids but you should avoid alcoholic beverages for 24 hours.  ACTIVITY:  You should plan to take it easy for the rest of today and you should NOT DRIVE or use heavy machinery until tomorrow (because of the sedation medicines used during the test).     FOLLOW UP: Our staff will call the number listed on your records the next business day following your procedure to check on you and address any questions or concerns that you may have regarding the information given to you following your procedure. If we do not reach you, we will leave a message.  However, if you are feeling well and you are not experiencing any problems, there is no need to return our call.  We will assume that you have returned to your regular daily activities without incident.  If any biopsies were taken you will be contacted by phone or by letter within the next 1-3 weeks.  Please call us at [phone] if you have not heard about the biopsies in 3 weeks.   Await for biopsy results to determined next repeat Colonoscopy Screening Polyps (handout given) Hemorrhoids (handout given) No Ibuprofen, Aspirin, Naproxen, or other Non-Steriodal anti-inflammatory drugs.  Resume Coumadin (Warfarin) at prior dose in 2 days. Refer to Coumadin Clinic for further adjustment of therapy.   SIGNATURES/CONFIDENTIALITY: You and/or your care partner have signed paperwork which will be entered into your electronic medical record.  These signatures attest to the fact that that the information above on your After Visit Summary has been reviewed and is understood.  Full responsibility of the confidentiality of this discharge information lies with you and/or your care-partner.YOU HAD AN ENDOSCOPIC PROCEDURE TODAY AT [facility]:  Refer to the procedure report that was given to you for any specific questions about what was found during the examination.  If the procedure report does not answer your questions, please call your gastroenterologist to clarify.  If you requested that your care partner not be given the details of your procedure findings, then the procedure report has been included in a sealed envelope for you to review at your convenience later.

## 2017-01-28 NOTE — Progress Notes (Signed)
Monitor shows inytermitent ?junctional? rhythm heart rate 40s, Glycopyrollate 0.2.

## 2017-01-28 NOTE — Op Note (Signed)
[facility] Patient Name: Colton Miranda Procedure Date: 01/28/2017 8:10 AM MRN: [id number] Endoscopist: Mauri Pole , MD Age: 76 Referring MD:  Date of Birth: [birth year] Gender: Male Account #: [id number] Procedure:                Colonoscopy Indications:              Screening patient at increased risk: Family history                            of 1st-degree relative with colorectal cancer at                            age 6 years (or older), High risk colon cancer                            surveillance: Personal history of colonic polyps Medicines:                Monitored Anesthesia Care Procedure:                Pre-Anesthesia Assessment:                           - Prior to the procedure, a History and Physical                            was performed, and patient medications and                            allergies were reviewed. The patient's tolerance of                            previous anesthesia was also reviewed. The risks                            and benefits of the procedure and the sedation                            options and risks were discussed with the patient.                            All questions were answered, and informed consent                            was obtained. Prior Anticoagulants: The patient has                            taken no previous anticoagulant or antiplatelet                            agents. ASA Grade Assessment: III - A patient with                            severe systemic disease. After reviewing the risks  and benefits, the patient was deemed in                            satisfactory condition to undergo the procedure.                           After obtaining informed consent, the colonoscope                            was passed under direct vision. Throughout the                            procedure, the patient's blood pressure, pulse, and                            oxygen  saturations were monitored continuously. The                            Colonoscope was introduced through the anus and                            advanced to the the terminal ileum, with                            identification of the appendiceal orifice and IC                            valve. The colonoscopy was somewhat difficult due                            to unsatisfactory bowel prep, significant looping                            and the patient's body habitus. Successful                            completion of the procedure was aided by applying                            abdominal pressure and lavage. The patient                            tolerated the procedure well. The quality of the                            bowel preparation was adequate. The terminal ileum,                            ileocecal valve, appendiceal orifice, and rectum                            were photographed. Scope In: 8:18:52 AM Scope Out: 8:56:34 AM Scope Withdrawal Time: 0 hours 18 minutes 40 seconds  Total Procedure Duration: 0 hours 37 minutes  42 seconds  Findings:                 The perianal and digital rectal examinations were                            normal.                           Five sessile polyps were found in the sigmoid colon                            (hyper plastic appearing) and transverse colon. The                            polyps were 4 to 8 mm in size. These polyps were                            removed with a cold snare. Resection and retrieval                            were complete.                           A 2 mm polyp was found in the transverse colon. The                            polyp was sessile. The polyp was removed with a                            cold biopsy forceps. Resection and retrieval were                            complete.                           Non-bleeding internal hemorrhoids were found during                            retroflexion. The  hemorrhoids were large. Complications:            No immediate complications. Estimated Blood Loss:     Estimated blood loss was minimal. Impression:               - Five 4 to 8 mm polyps in the sigmoid colon and in                            the transverse colon, removed with a cold snare.                            Resected and retrieved.                           - One 2 mm polyp in the transverse colon, removed  with a cold biopsy forceps. Resected and retrieved.                           - Non-bleeding internal hemorrhoids. Recommendation:           - Patient has a contact number available for                            emergencies. The signs and symptoms of potential                            delayed complications were discussed with the                            patient. Return to normal activities tomorrow.                            Written discharge instructions were provided to the                            patient.                           - Resume previous diet.                           - Continue present medications.                           - Await pathology results.                           - No aspirin, ibuprofen, naproxen, or other                            non-steroidal anti-inflammatory drugs.                           - Resume Coumadin (warfarin) at prior dose in 2                            days. Refer to Coumadin Clinic for further                            adjustment of therapy.                           - Repeat colonoscopy in 5 years for surveillance                            based on pathology results. Mauri Pole, MD 01/28/2017 9:03:19 AM This report has been signed electronically.

## 2017-01-28 NOTE — Progress Notes (Signed)
Called to room to assist during endoscopic procedure.  Patient ID and intended procedure confirmed with present staff. Received instructions for my participation in the procedure from the performing physician.  

## 2017-01-28 NOTE — Progress Notes (Signed)
Monitor shows baseline, Sinus brady with BBB. VSS. To recovery, awake, report given

## 2017-01-29 ENCOUNTER — Telehealth: Payer: Self-pay | Admitting: *Deleted

## 2017-01-29 NOTE — Telephone Encounter (Signed)
  Follow up Call-  Call back number 01/28/2017  Post procedure Call Back phone  # [phone]  Permission to leave phone message Yes  Some recent data might be hidden     Patient questions:  Do you have a fever, pain , or abdominal swelling? No. Pain Score  0 *  Have you tolerated food without any problems? Yes.    Have you been able to return to your normal activities? Yes.    Do you have any questions about your discharge instructions: Diet   No. Medications  No. Follow up visit  No.  Do you have questions or concerns about your Care? No.  Actions: * If pain score is 4 or above: No action needed, pain <4.

## 2017-02-02 ENCOUNTER — Ambulatory Visit (INDEPENDENT_AMBULATORY_CARE_PROVIDER_SITE_OTHER): Payer: PPO | Admitting: Pharmacotherapy

## 2017-02-02 ENCOUNTER — Encounter: Payer: Self-pay | Admitting: Pharmacotherapy

## 2017-02-02 VITALS — BP 118/78 | HR 50 | Ht 64.5 in | Wt 238.0 lb

## 2017-02-02 DIAGNOSIS — Z7901 Long term (current) use of anticoagulants: Secondary | ICD-10-CM | POA: Diagnosis not present

## 2017-02-02 DIAGNOSIS — D6859 Other primary thrombophilia: Secondary | ICD-10-CM

## 2017-02-02 DIAGNOSIS — E119 Type 2 diabetes mellitus without complications: Secondary | ICD-10-CM | POA: Diagnosis not present

## 2017-02-02 LAB — POCT INR: INR: 1.1

## 2017-02-02 MED ORDER — GLUCOSE BLOOD VI STRP: ORAL_STRIP | 11 refills | Status: DC

## 2017-02-02 MED ORDER — ONETOUCH DELICA LANCETS 33G MISC: 1.0000 | Freq: Three times a day (TID) | 12 refills | Status: DC

## 2017-02-02 NOTE — Patient Instructions (Signed)
INR 1.1  Coumadin 17.5mg  daily x 3 days, then resume 15mg  daily except 17.5mg  on Mondays, Wednesdays, and Fridays

## 2017-02-02 NOTE — Progress Notes (Signed)
   Subjective:    Patient ID: Colton Miranda, male    DOB: 19-Dec-1940, 76 y.o.   MRN: [id number]  HPI Last INR on 12/22/16 was OK at 2.5 He has been off Coumadin x 7 doses due to colonoscopy.  Restarted Coumadin on Friday Current Coumadin dose is 15mg  QD except 17.5mg  MWF. Denies unusual bleeding or bruising Denies CP, SOB, falls Consistent with vitamin K intake.    Review of Systems  HENT: Negative for nosebleeds.   Respiratory: Negative for shortness of breath.   Cardiovascular: Negative for chest pain.  Gastrointestinal: Negative for anal bleeding and blood in stool.  Genitourinary: Negative for hematuria.  Hematological: Does not bruise/bleed easily.       Objective:   Physical Exam  Constitutional: He is oriented to person, place, and time. He appears well-developed and well-nourished.  HENT:  Right Ear: External ear normal.  Left Ear: External ear normal.  Cardiovascular: Normal rate, regular rhythm and normal heart sounds.   Pulmonary/Chest: Effort normal and breath sounds normal.  Neurological: He is alert and oriented to person, place, and time.  Skin: Skin is warm and dry.  Psychiatric: He has a normal mood and affect. His behavior is normal. Judgment and thought content normal.  Vitals reviewed.   Average BG: 118mg /dl No hypoglycemia  BP: 118/78  HR: 50  Wt: 238lb INR 1.1      Assessment & Plan:  1.  INR below target 2-3 due to missed doses for colonoscopy. 2.  Coumadin 17.5mg  x 3 days, then resume Coumadin 15mg  QD except 17.5mg  MWF. 3.  RTC in 4 weeks.

## 2017-02-04 ENCOUNTER — Encounter: Payer: Self-pay | Admitting: Gastroenterology

## 2017-03-02 ENCOUNTER — Encounter: Payer: Self-pay | Admitting: Pharmacotherapy

## 2017-03-02 ENCOUNTER — Ambulatory Visit (INDEPENDENT_AMBULATORY_CARE_PROVIDER_SITE_OTHER): Payer: PPO | Admitting: Pharmacotherapy

## 2017-03-02 VITALS — BP 110/68 | HR 49 | Ht 65.0 in | Wt 241.0 lb

## 2017-03-02 DIAGNOSIS — Z7901 Long term (current) use of anticoagulants: Secondary | ICD-10-CM | POA: Diagnosis not present

## 2017-03-02 DIAGNOSIS — D6859 Other primary thrombophilia: Secondary | ICD-10-CM

## 2017-03-02 LAB — POCT INR: INR: 2.2

## 2017-03-02 NOTE — Patient Instructions (Signed)
INR 2.2  Continue Coumadin 15mg  daily except 17.5mg  Mondays, Wednesdays, and Fridays

## 2017-03-02 NOTE — Progress Notes (Signed)
   Subjective:    Patient ID: Colton Miranda, male    DOB: [birth year], 76 y.o.   MRN: [id number]  HPI Last INR on 02/02/17 was low at 1.1 due to colonoscopy. Coumadin was boosted, then resumed at 15mg  QD except 17.5mg  MWF. Denies missed doses. Denies unusual bleeding or bruising. Denies CP, SOB, falls Consistent with vitamin K intake.  Average BG: 110mg /dl No hypoglycemia.   Review of Systems  HENT: Negative for mouth sores.   Respiratory: Negative for shortness of breath.   Cardiovascular: Negative for chest pain.  Gastrointestinal: Negative for anal bleeding and blood in stool.  Genitourinary: Negative for hematuria.  Hematological: Does not bruise/bleed easily.       Objective:   Physical Exam  Constitutional: He is oriented to person, place, and time. He appears well-developed and well-nourished.  HENT:  Right Ear: External ear normal.  Left Ear: External ear normal.  Cardiovascular: Normal rate, regular rhythm and normal heart sounds.   Pulmonary/Chest: Effort normal and breath sounds normal.  Neurological: He is alert and oriented to person, place, and time.  Skin: Skin is warm and dry.  Psychiatric: He has a normal mood and affect. His behavior is normal. Judgment and thought content normal.  Vitals reviewed.  BP: 110/68  HR: 49  Wt: 241lb INR 2.2       Assessment & Plan:  1.  INR at goal 2-3. 2.  Continue Coumadin 15mg  QD except 17.5mg  MWF 3.  RTC in 6 weeks

## 2017-03-11 ENCOUNTER — Other Ambulatory Visit: Payer: Self-pay | Admitting: Internal Medicine

## 2017-03-11 DIAGNOSIS — E119 Type 2 diabetes mellitus without complications: Secondary | ICD-10-CM

## 2017-03-11 NOTE — Telephone Encounter (Signed)
Tramadol 50mg  #120 0RF called into [facility] [address].

## 2017-03-16 ENCOUNTER — Telehealth: Payer: Self-pay | Admitting: *Deleted

## 2017-03-16 NOTE — Telephone Encounter (Signed)
Pt called and stated that he would call the office once he actually began taking warfarin from different manufacture to set up 1 week apt.

## 2017-03-16 NOTE — Telephone Encounter (Signed)
Fax from pharmacy requesting to change warfarin from [employer] brand to [employer] brand temporarily. Ok to do and will he need INR check sooner after changing manufacturers?

## 2017-03-16 NOTE — Telephone Encounter (Signed)
Pharmacy notified. Message left for patient to call and schedule INR check for 1 week after starting new med. Forwarded to [facility] since I won't be in the office tomorrow for follow up.

## 2017-03-16 NOTE — Telephone Encounter (Signed)
Yes let recheck next Monday

## 2017-03-20 NOTE — Telephone Encounter (Signed)
Patient returned call and scheduled appointment for Friday 03/27/17

## 2017-03-27 ENCOUNTER — Encounter: Payer: Self-pay | Admitting: Nurse Practitioner

## 2017-03-27 ENCOUNTER — Ambulatory Visit (INDEPENDENT_AMBULATORY_CARE_PROVIDER_SITE_OTHER): Payer: PPO | Admitting: Nurse Practitioner

## 2017-03-27 VITALS — BP 128/84 | HR 62 | Temp 97.9°F | Resp 17 | Ht 65.0 in | Wt 245.4 lb

## 2017-03-27 DIAGNOSIS — D6859 Other primary thrombophilia: Secondary | ICD-10-CM | POA: Diagnosis not present

## 2017-03-27 DIAGNOSIS — Z7901 Long term (current) use of anticoagulants: Secondary | ICD-10-CM | POA: Diagnosis not present

## 2017-03-27 DIAGNOSIS — G4700 Insomnia, unspecified: Secondary | ICD-10-CM

## 2017-03-27 LAB — POCT INR: INR: 3

## 2017-03-27 MED ORDER — WARFARIN SODIUM 10 MG PO TABS: ORAL_TABLET | ORAL | 1 refills | Status: DC

## 2017-03-27 MED ORDER — MELATONIN 5 MG PO CAPS: 5.0000 mg | ORAL_CAPSULE | Freq: Every day | ORAL | 0 refills | Status: DC

## 2017-03-27 NOTE — Progress Notes (Signed)
Careteam: Patient Care Team: Colton Chandler, NP as PCP - General (Geriatric Medicine) [name], MD as Consulting Physician (Orthopedic Surgery) [name], MD as Consulting Physician (Cardiology)  Advanced Directive information Does Patient Have a Medical Advance Directive?: No  No Known Allergies  Chief Complaint  Patient presents with  . Medical Management of Chronic Issues    Pt is being seen for an INR check due to change in manufacture of medication. Patient's INR is 3.0     HPI: Patient is a 76 y.o. male seen in the office today for INR check due to change in manu factor.  Pt with hx of hypercoagulable state. Pt taking 17.5 mg daily on Saturday, Sunday, Tuesday and Thursday and 15 mg coumadin on Monday, Wednesday, Friday No changes in diet, no missed doses. No CP, shortness of breath or falls.  No bloody noses, blood in stool or urine.  States he may have eaten more greens than normal over the 4th.  Has bruising. Feels like it is a little more than normal.  Last INR was 2.2 on 6/11. Reports INR has never been 3.0   Review of Systems:  Review of Systems  Constitutional: Negative for chills, fever and weight loss.  Respiratory: Negative for hemoptysis and shortness of breath.   Cardiovascular: Negative for chest pain and leg swelling.  Gastrointestinal: Negative for blood in stool.  Genitourinary: Negative for hematuria.  Skin:       Bruising   Neurological: Negative for dizziness, sensory change and loss of consciousness.  Endo/Heme/Allergies: Bruises/bleeds easily.    Past Medical History:  Diagnosis Date  . Acute, but ill-defined, cerebrovascular disease   . Basal cell carcinoma of skin of trunk, except scrotum   . Chronic airway obstruction, not elsewhere classified   . Diabetes mellitus 10/2010   T2DM, new dx   . Disturbance of skin sensation   . Edema   . Epistaxis   . Epistaxis   . 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 History of fall  07/25/2015  . Hypertension   . Hypertrophy of prostate with urinary obstruction and other lower urinary tract symptoms (LUTS)   . Impacted cerumen 02/23/13  . Long term (current) use of anticoagulants   . Memory loss   . Myalgia and myositis, unspecified   . Nonspecific abnormal electrocardiogram (ECG) (EKG)   . Obesity (BMI 30-39.9)   . Obesity, unspecified   . Osteoarthrosis, unspecified whether generalized or localized, unspecified site   . Other and unspecified disc disorder of lumbar region   . Other and unspecified hyperlipidemia   . Other pulmonary embolism and infarction 1988  . Pain in joint, lower leg   . Pain in limb   . Palpitations   . Primary hypercoagulable state ([place])   . Right bundle branch block   . Seborrheic dermatitis, unspecified   . Stiffness of joint, not elsewhere classified, unspecified site   . Stroke ([place])   . Tobacco use disorder   . Type II or unspecified type diabetes mellitus without mention of complication, not stated as uncontrolled   . Type II or unspecified type diabetes mellitus without mention of complication, uncontrolled   . Undiagnosed cardiac murmurs   . Unspecified constipation   . Unspecified late effects of cerebrovascular disease   . Unspecified vitamin D deficiency   . Urinary frequency    Past Surgical History:  Procedure Laterality Date  . CHOLECYSTECTOMY  1981  . COLONOSCOPY  2009  .  KNEE ARTHROSCOPY Right   . PARTIAL HIP ARTHROPLASTY Right 06/18/2009   Dr. Delilah Shan  . PILONIDAL CYST EXCISION  1968   base of spine   Social History:   reports that he quit smoking about 8 years ago. He has never used smokeless tobacco. He reports that he drinks about 1.8 oz of alcohol per week . He reports that he does not use drugs.  Family History  Problem Relation Age of Onset  . Cancer Mother 18       colon , breast  . Colon cancer Mother   . Heart attack Daughter        2008  . Parkinson's disease Sister     Medications: Patient's  Medications  New Prescriptions   No medications on file  Previous Medications   BLOOD GLUCOSE MONITORING SUPPL (ONETOUCH VERIO) W/DEVICE KIT    by Does not apply route. Check blood sugar twice daily as directed DX E11.65   CHOLECALCIFEROL (VITAMIN D) 2000 UNITS CAPS    Take 1 capsule by mouth daily.     GLUCOSE BLOOD (ONETOUCH VERIO) TEST STRIP    Check blood sugar twice daily as directed E11.65   IBUPROFEN-DIPHENHYDRAMINE CIT (ADVIL PM PO)    Take 1 tablet by mouth at bedtime as needed.   LISINOPRIL (PRINIVIL,ZESTRIL) 20 MG TABLET    TAKE ONE TABLET BY MOUTH ONCE DAILY FOR  BLOOD  PRESSURE   METFORMIN (GLUCOPHAGE) 500 MG TABLET    TAKE ONE TABLET BY MOUTH ONCE DAILY IN THE MORNING FOR  BLOOD  SUGAR  CONTROL   MISC NATURAL PRODUCTS (OSTEO BI-FLEX ADV TRIPLE ST PO)    Take by mouth daily.   NYSTATIN (MYCOSTATIN/NYSTOP) POWDER    100,000 g daily as needed. Apply to groin area for yeast/fungal infection   ONETOUCH DELICA LANCETS 37T MISC    1 each by Does not apply route 3 (three) times daily. Check blood sugar twice daily E11.65   SIMVASTATIN (ZOCOR) 20 MG TABLET    TAKE ONE TABLET BY MOUTH ONCE DAILY FOR CHOLESTEROL   TRAMADOL (ULTRAM) 50 MG TABLET    TAKE 1 TABLET BY MOUTH 4 TIMES DAILY AS NEEDED FOR PAIN   TRIAMCINOLONE LOTION (KENALOG) 0.1 %    Use once daily as needed for skin irritation behind left ear   WARFARIN (COUMADIN) 10 MG TABLET    Take one tablet  by mouth daily along with a 5 mg tablet for a total of 15 mg  EXCEPT on M/W/F take 17.5 mg   WARFARIN (COUMADIN) 5 MG TABLET    5 mg. 1  by mouth daily along with a 10 mg tablet for a total of 15 mg  EXCEPT on M/W/F take 17.5 mg  Modified Medications   No medications on file  Discontinued Medications   No medications on file     Physical Exam:  Vitals:   03/27/17 0912  BP: 128/84  Pulse: 62  Resp: 17  Temp: 97.9 F (36.6 C)  TempSrc: Oral  SpO2: 98%  Weight: 245 lb 6.4 oz (111.3 kg)  Height: 5' 5"  (1.651 m)   Body mass  index is 40.84 kg/m.  Physical Exam  Constitutional: He is oriented to person, place, and time. He appears well-developed and well-nourished.  HENT:  Right Ear: External ear normal.  Left Ear: External ear normal.  Cardiovascular: Normal rate, regular rhythm and normal heart sounds.   Pulmonary/Chest: Effort normal and breath sounds normal.  Neurological: He is alert and oriented  to person, place, and time.  Skin: Skin is warm and dry.  Psychiatric: He has a normal mood and affect. His behavior is normal. Judgment and thought content normal.  Vitals reviewed.   Labs reviewed: Basic Metabolic Panel:  Recent Labs  05/23/16 0926  NA 134*  K 4.8  CL 101  CO2 26  GLUCOSE 95  BUN 16  CREATININE 1.12  CALCIUM 9.4   Liver Function Tests:  Recent Labs  05/23/16 0926  AST 23  ALT 24  ALKPHOS 57  BILITOT 0.6  PROT 6.9  ALBUMIN 4.3   No results for input(s): LIPASE, AMYLASE in the last 8760 hours. No results for input(s): AMMONIA in the last 8760 hours. CBC: No results for input(s): WBC, NEUTROABS, HGB, HCT, MCV, PLT in the last 8760 hours. Lipid Panel:  Recent Labs  05/23/16 0926  CHOL 104*  HDL 43  LDLCALC 43  TRIG 89  CHOLHDL 2.4   TSH: No results for input(s): TSH in the last 8760 hours. A1C: Lab Results  Component Value Date   HGBA1C 6.1 (H) 05/23/2016     Assessment/Plan 1. Long term current use of anticoagulant therapy - POC INR of 3.0 with increase in Vit k intake, will reduce coumadin to 15 mg daily  - warfarin (COUMADIN) 10 MG tablet; Take one tablet  by mouth daily along with a 5 mg tablet for a total of 15 mg daily  Dispense: 90 tablet; Refill: 1  2. Primary hypercoagulable state ([place]) No signs of recurrent clot  3. Insomnia, unspecified type -has been using Advil PM in the past, has not uses in several months. Advised not to use NSAIDS.  - Melatonin 5 MG CAPS; Take 1 capsule (5 mg total) by mouth at bedtime.; Refill: 0     Navia Lindahl K.  Harle Battiest  [facility] [phone] 8 am - 5 pm) [phone] (after hours)

## 2017-03-27 NOTE — Patient Instructions (Addendum)
CHANGE coumadin to 15 mg daily   To follow up in 1 week for INR check   Should not take NSAIDS (Aleve, Advil, Motrin, Ibuprofen)  With coumadin

## 2017-04-03 ENCOUNTER — Ambulatory Visit (INDEPENDENT_AMBULATORY_CARE_PROVIDER_SITE_OTHER): Payer: PPO | Admitting: Nurse Practitioner

## 2017-04-03 ENCOUNTER — Encounter: Payer: Self-pay | Admitting: Nurse Practitioner

## 2017-04-03 VITALS — BP 124/78 | HR 60 | Temp 97.9°F | Resp 18 | Ht 65.0 in | Wt 243.0 lb

## 2017-04-03 DIAGNOSIS — D6859 Other primary thrombophilia: Secondary | ICD-10-CM | POA: Diagnosis not present

## 2017-04-03 DIAGNOSIS — Z7901 Long term (current) use of anticoagulants: Secondary | ICD-10-CM | POA: Diagnosis not present

## 2017-04-03 LAB — POCT INR: INR: 2.1

## 2017-04-03 MED ORDER — METFORMIN HCL 500 MG PO TABS: ORAL_TABLET | ORAL | 1 refills | Status: DC

## 2017-04-03 NOTE — Patient Instructions (Signed)
Cont current 15 mg daily of coumadin, keep follow up with [employer]

## 2017-04-03 NOTE — Progress Notes (Signed)
Careteam: Patient Care Team: Lauree Chandler, NP as PCP - General (Geriatric Medicine) [name], MD as Consulting Physician (Orthopedic Surgery) [name], MD as Consulting Physician (Cardiology)  Advanced Directive information Does Patient Have a Medical Advance Directive?: No  No Known Allergies  Chief Complaint  Patient presents with  . Follow-up    Pt is being seen for an INR recheck. INR is 2.1 today.     HPI: Patient is a 76 y.o. male seen in the office today to check INR.  Pt with hx of hypercoagulable state. Pt was previous taking 17.5 mg daily on Saturday, Sunday, Tuesday and Thursday and 15 mg coumadin on Monday, Wednesday, Friday and INR was 3.0 after taking increase in Vit K therefore dose reduced to coumadin 15 mg daily. INR today 2.1 No changes in diet, no missed doses. No CP, shortness of breath or falls.  No bloody noses, blood in stool or urine. The increase in bruising has improved.  Goal INR 2-3  Review of Systems:  Review of Systems  Constitutional: Negative for chills, fever and weight loss.  Respiratory: Negative for hemoptysis and shortness of breath.   Cardiovascular: Negative for chest pain and leg swelling.  Gastrointestinal: Negative for blood in stool.  Genitourinary: Negative for hematuria.  Neurological: Negative for dizziness, sensory change and loss of consciousness.  Endo/Heme/Allergies: Bruises/bleeds easily.    Past Medical History:  Diagnosis Date  . Acute, but ill-defined, cerebrovascular disease   . Basal cell carcinoma of skin of trunk, except scrotum   . Chronic airway obstruction, not elsewhere classified   . Diabetes mellitus 10/2010   T2DM, new dx   . Disturbance of skin sensation   . Edema   . Epistaxis   . Epistaxis   . 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 History of fall 07/25/2015  . Hypertension   . Hypertrophy of prostate with urinary obstruction and other lower urinary tract symptoms (LUTS)   .  Impacted cerumen 02/23/13  . Long term (current) use of anticoagulants   . Memory loss   . Myalgia and myositis, unspecified   . Nonspecific abnormal electrocardiogram (ECG) (EKG)   . Obesity (BMI 30-39.9)   . Obesity, unspecified   . Osteoarthrosis, unspecified whether generalized or localized, unspecified site   . Other and unspecified disc disorder of lumbar region   . Other and unspecified hyperlipidemia   . Other pulmonary embolism and infarction 1988  . Pain in joint, lower leg   . Pain in limb   . Palpitations   . Primary hypercoagulable state ([place])   . Right bundle branch block   . Seborrheic dermatitis, unspecified   . Stiffness of joint, not elsewhere classified, unspecified site   . Stroke ([place])   . Tobacco use disorder   . Type II or unspecified type diabetes mellitus without mention of complication, not stated as uncontrolled   . Type II or unspecified type diabetes mellitus without mention of complication, uncontrolled   . Undiagnosed cardiac murmurs   . Unspecified constipation   . Unspecified late effects of cerebrovascular disease   . Unspecified vitamin D deficiency   . Urinary frequency    Past Surgical History:  Procedure Laterality Date  . CHOLECYSTECTOMY  1981  . COLONOSCOPY  2009  . KNEE ARTHROSCOPY Right   . PARTIAL HIP ARTHROPLASTY Right 06/18/2009   Dr. Delilah Shan  . PILONIDAL CYST EXCISION  1968   base of spine   Social History:  reports that he quit smoking about 8 years ago. He has never used smokeless tobacco. He reports that he drinks about 1.8 oz of alcohol per week . He reports that he does not use drugs.  Family History  Problem Relation Age of Onset  . Cancer Mother 74       colon , breast  . Colon cancer Mother   . Heart attack Daughter        2008  . Parkinson's disease Sister     Medications: Patient's Medications  New Prescriptions   No medications on file  Previous Medications   BLOOD GLUCOSE MONITORING SUPPL (ONETOUCH VERIO)  W/DEVICE KIT    by Does not apply route. Check blood sugar twice daily as directed DX E11.65   CHOLECALCIFEROL (VITAMIN D) 2000 UNITS CAPS    Take 1 capsule by mouth daily.     GLUCOSE BLOOD (ONETOUCH VERIO) TEST STRIP    Check blood sugar twice daily as directed E11.65   LISINOPRIL (PRINIVIL,ZESTRIL) 20 MG TABLET    TAKE ONE TABLET BY MOUTH ONCE DAILY FOR  BLOOD  PRESSURE   MELATONIN 5 MG CAPS    Take 1 capsule (5 mg total) by mouth at bedtime.   MISC NATURAL PRODUCTS (OSTEO BI-FLEX ADV TRIPLE ST PO)    Take by mouth daily.   NYSTATIN (MYCOSTATIN/NYSTOP) POWDER    100,000 g daily as needed. Apply to groin area for yeast/fungal infection   ONETOUCH DELICA LANCETS 94R MISC    1 each by Does not apply route 3 (three) times daily. Check blood sugar twice daily E11.65   SIMVASTATIN (ZOCOR) 20 MG TABLET    TAKE ONE TABLET BY MOUTH ONCE DAILY FOR CHOLESTEROL   TRAMADOL (ULTRAM) 50 MG TABLET    TAKE 1 TABLET BY MOUTH 4 TIMES DAILY AS NEEDED FOR PAIN   TRIAMCINOLONE LOTION (KENALOG) 0.1 %    Use once daily as needed for skin irritation behind left ear   WARFARIN (COUMADIN) 10 MG TABLET    Take one tablet  by mouth daily along with a 5 mg tablet for a total of 15 mg daily  Modified Medications   Modified Medication Previous Medication   METFORMIN (GLUCOPHAGE) 500 MG TABLET metFORMIN (GLUCOPHAGE) 500 MG tablet      TAKE ONE TABLET BY MOUTH ONCE DAILY IN THE MORNING FOR  BLOOD  SUGAR  CONTROL    TAKE ONE TABLET BY MOUTH ONCE DAILY IN THE MORNING FOR  BLOOD  SUGAR  CONTROL  Discontinued Medications   No medications on file     Physical Exam:  Vitals:   04/03/17 1025  BP: 124/78  Pulse: 60  Resp: 18  Temp: 97.9 F (36.6 C)  TempSrc: Oral  SpO2: 97%  Weight: 243 lb (110.2 kg)  Height: 5' 5"  (1.651 m)   Body mass index is 40.44 kg/m.  Physical Exam  Constitutional: He is oriented to person, place, and time. He appears well-developed and well-nourished.  HENT:  Right Ear: External ear  normal.  Left Ear: External ear normal.  Cardiovascular: Normal rate, regular rhythm and normal heart sounds.   Pulmonary/Chest: Effort normal and breath sounds normal.  Neurological: He is alert and oriented to person, place, and time.  Skin: Skin is warm and dry.  Psychiatric: He has a normal mood and affect. His behavior is normal. Judgment and thought content normal.  Vitals reviewed.   Labs reviewed: Basic Metabolic Panel:  Recent Labs  05/23/16 0926  NA 134*  K 4.8  CL 101  CO2 26  GLUCOSE 95  BUN 16  CREATININE 1.12  CALCIUM 9.4   Liver Function Tests:  Recent Labs  05/23/16 0926  AST 23  ALT 24  ALKPHOS 57  BILITOT 0.6  PROT 6.9  ALBUMIN 4.3   No results for input(s): LIPASE, AMYLASE in the last 8760 hours. No results for input(s): AMMONIA in the last 8760 hours. CBC: No results for input(s): WBC, NEUTROABS, HGB, HCT, MCV, PLT in the last 8760 hours. Lipid Panel:  Recent Labs  05/23/16 0926  CHOL 104*  HDL 43  LDLCALC 43  TRIG 89  CHOLHDL 2.4   TSH: No results for input(s): TSH in the last 8760 hours. A1C: Lab Results  Component Value Date   HGBA1C 6.1 (H) 05/23/2016     Assessment/Plan 1. Long term current use of anticoagulant therapy -will cont current dose at coumadin 15 mg daily  - POC INR  2. Primary hypercoagulable state ([place]) No signs of recurrence  To keep follow up with [facility] on 04/13/17 Jessica K. Harle Battiest  [facility] [phone] 8 am - 5 pm) [phone] (after hours)

## 2017-04-13 ENCOUNTER — Encounter: Payer: Self-pay | Admitting: Pharmacotherapy

## 2017-04-13 ENCOUNTER — Ambulatory Visit (INDEPENDENT_AMBULATORY_CARE_PROVIDER_SITE_OTHER): Payer: PPO | Admitting: Pharmacotherapy

## 2017-04-13 DIAGNOSIS — D6859 Other primary thrombophilia: Secondary | ICD-10-CM | POA: Diagnosis not present

## 2017-04-13 DIAGNOSIS — Z7901 Long term (current) use of anticoagulants: Secondary | ICD-10-CM

## 2017-04-13 LAB — POCT INR: INR: 2.2

## 2017-04-13 MED ORDER — WARFARIN SODIUM 10 MG PO TABS: ORAL_TABLET | ORAL | 1 refills | Status: DC

## 2017-04-13 NOTE — Patient Instructions (Signed)
INR 2.2  Continue Coumadin 15mg  daily

## 2017-04-13 NOTE — Progress Notes (Signed)
   Subjective:    Patient ID: Colton Miranda, male    DOB: April 16, 1941, 76 y.o.   MRN: [id number]  HPI His warfarin manufacturer has changed.  His dose is now 15mg  daily. Denies unusual bleeding or bruising Denies CP, SOB, falls Consistent with vitamin K intake.  Average BG: 116mg /dl No hypoglycemia   Review of Systems  HENT: Negative for nosebleeds.   Respiratory: Negative for shortness of breath.   Cardiovascular: Negative for chest pain.  Gastrointestinal: Negative for anal bleeding and blood in stool.  Genitourinary: Negative for hematuria.  Hematological: Does not bruise/bleed easily.       Objective:   Physical Exam  Constitutional: He is oriented to person, place, and time. He appears well-developed and well-nourished.  HENT:  Right Ear: External ear normal.  Left Ear: External ear normal.  Cardiovascular: Normal rate, regular rhythm and normal heart sounds.   Pulmonary/Chest: Effort normal and breath sounds normal.  Neurological: He is alert and oriented to person, place, and time.  Skin: Skin is warm and dry.  Psychiatric: He has a normal mood and affect. His behavior is normal. Judgment and thought content normal.  Vitals reviewed.   BP: 118/70  HR: 50  Wt: 240lb INR 2.2      Assessment & Plan:  1.  INR at goal 2-3 2.  Continue Coumadin 15mg  daily 3.  INR in 1 month

## 2017-04-30 ENCOUNTER — Other Ambulatory Visit: Payer: Self-pay | Admitting: Internal Medicine

## 2017-04-30 DIAGNOSIS — E785 Hyperlipidemia, unspecified: Secondary | ICD-10-CM

## 2017-05-27 ENCOUNTER — Other Ambulatory Visit: Payer: Self-pay | Admitting: Nurse Practitioner

## 2017-05-27 DIAGNOSIS — E119 Type 2 diabetes mellitus without complications: Secondary | ICD-10-CM

## 2017-06-01 ENCOUNTER — Encounter: Payer: Self-pay | Admitting: Pharmacotherapy

## 2017-06-01 ENCOUNTER — Ambulatory Visit (INDEPENDENT_AMBULATORY_CARE_PROVIDER_SITE_OTHER): Payer: PPO | Admitting: Pharmacotherapy

## 2017-06-01 VITALS — BP 122/74 | HR 63 | Temp 97.6°F | Resp 17 | Ht 65.0 in | Wt 238.0 lb

## 2017-06-01 DIAGNOSIS — Z7901 Long term (current) use of anticoagulants: Secondary | ICD-10-CM | POA: Diagnosis not present

## 2017-06-01 DIAGNOSIS — D6859 Other primary thrombophilia: Secondary | ICD-10-CM | POA: Diagnosis not present

## 2017-06-01 LAB — POCT INR: INR: 2.9

## 2017-06-01 NOTE — Progress Notes (Signed)
   Subjective:    Patient ID: Colton Miranda, male    DOB: 1941-04-22, 76 y.o.   MRN: [id number]  HPI Last INR on 04/13/17 was OK at 2.2 Current Coumadin dose is 15mg  daily Denies CP, SOB, falls Denies missed doses Denies unusual bleeding or bruising. Consistent with vitamin K intake  Average BG: 115mg  daily No hypoglycemia  Review of Systems  HENT: Negative for nosebleeds.   Respiratory: Negative for shortness of breath.   Cardiovascular: Negative for chest pain.  Gastrointestinal: Negative for anal bleeding and blood in stool.  Genitourinary: Negative for hematuria.  Hematological: Does not bruise/bleed easily.       Objective:   Physical Exam  Constitutional: He is oriented to person, place, and time. He appears well-developed and well-nourished.  HENT:  Right Ear: External ear normal.  Left Ear: External ear normal.  Cardiovascular: Normal rate, regular rhythm and normal heart sounds.   Pulmonary/Chest: Effort normal and breath sounds normal.  Neurological: He is alert and oriented to person, place, and time.  Skin: Skin is warm and dry.  Psychiatric: He has a normal mood and affect. His behavior is normal. Judgment and thought content normal.  Vitals reviewed.   BP: 122/74  HR: 63  Wt: 238lb INR 2.9      Assessment & Plan:  1.  INR at goal 2-3 2.  Continue Coumadin 15mg  daily 3.  RTC 1 month

## 2017-06-01 NOTE — Patient Instructions (Signed)
INR 2.9  Continue Coumadin 15mg  daily

## 2017-06-04 ENCOUNTER — Ambulatory Visit (INDEPENDENT_AMBULATORY_CARE_PROVIDER_SITE_OTHER): Payer: PPO | Admitting: Nurse Practitioner

## 2017-06-04 ENCOUNTER — Encounter: Payer: Self-pay | Admitting: Nurse Practitioner

## 2017-06-04 VITALS — BP 124/76 | HR 63 | Temp 98.0°F | Resp 17 | Ht 65.0 in | Wt 238.8 lb

## 2017-06-04 DIAGNOSIS — I10 Essential (primary) hypertension: Secondary | ICD-10-CM

## 2017-06-04 DIAGNOSIS — R350 Frequency of micturition: Secondary | ICD-10-CM | POA: Diagnosis not present

## 2017-06-04 DIAGNOSIS — D6859 Other primary thrombophilia: Secondary | ICD-10-CM | POA: Diagnosis not present

## 2017-06-04 DIAGNOSIS — E119 Type 2 diabetes mellitus without complications: Secondary | ICD-10-CM | POA: Diagnosis not present

## 2017-06-04 DIAGNOSIS — G4700 Insomnia, unspecified: Secondary | ICD-10-CM

## 2017-06-04 DIAGNOSIS — E785 Hyperlipidemia, unspecified: Secondary | ICD-10-CM

## 2017-06-04 NOTE — Progress Notes (Signed)
Careteam: Patient Care Team: Lauree Chandler, NP as PCP - General (Geriatric Medicine) [name], MD as Consulting Physician (Orthopedic Surgery) [name], MD as Consulting Physician (Cardiology)  Advanced Directive information Does Patient Have a Medical Advance Directive?: No  No Known Allergies  Chief Complaint  Patient presents with  . Medical Management of Chronic Issues    Pt is being seen for a 6 month routine visit.   . Other    wife in room     HPI: Patient is a 76 y.o. male seen in the office today for routine follow up. Pt seeing cathy for INR due to hypercoagulable state. Taking coumadin 15 mg daily   Pt with hx of diabetes, htn, hyperlipidemia  Has colonoscopy in May 2018  Diabetes- has not had A1c since last year. Blood sugars at home ranging from 100-126 , taking metformin 500 mg daily  Osteoarthritis- takes supplement and tramadol every few days. Does not need this often. High pain tolerance.      Review of Systems:  Review of Systems  Constitutional: Negative for chills, fever and weight loss.  Respiratory: Negative for hemoptysis and shortness of breath.   Cardiovascular: Negative for chest pain and leg swelling.  Gastrointestinal: Negative for blood in stool, constipation, diarrhea and heartburn.  Genitourinary: Positive for frequency (nocuria- up to 3 times at night). Negative for dysuria, hematuria and urgency.  Musculoskeletal: Positive for joint pain.  Neurological: Negative for dizziness, sensory change and loss of consciousness.  Endo/Heme/Allergies: Bruises/bleeds easily.  Psychiatric/Behavioral: The patient does not have insomnia.     Past Medical History:  Diagnosis Date  . Acute, but ill-defined, cerebrovascular disease   . Basal cell carcinoma of skin of trunk, except scrotum   . Chronic airway obstruction, not elsewhere classified   . Diabetes mellitus 10/2010   T2DM, new dx   . Disturbance of skin sensation   . Edema    . Epistaxis   . Epistaxis   . 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 History of fall 07/25/2015  . Hypertension   . Hypertrophy of prostate with urinary obstruction and other lower urinary tract symptoms (LUTS)   . Impacted cerumen 02/23/13  . Long term (current) use of anticoagulants   . Memory loss   . Myalgia and myositis, unspecified   . Nonspecific abnormal electrocardiogram (ECG) (EKG)   . Obesity (BMI 30-39.9)   . Obesity, unspecified   . Osteoarthrosis, unspecified whether generalized or localized, unspecified site   . Other and unspecified disc disorder of lumbar region   . Other and unspecified hyperlipidemia   . Other pulmonary embolism and infarction 1988  . Pain in joint, lower leg   . Pain in limb   . Palpitations   . Primary hypercoagulable state ([place])   . Right bundle branch block   . Seborrheic dermatitis, unspecified   . Stiffness of joint, not elsewhere classified, unspecified site   . Stroke ([place])   . Tobacco use disorder   . Type II or unspecified type diabetes mellitus without mention of complication, not stated as uncontrolled   . Type II or unspecified type diabetes mellitus without mention of complication, uncontrolled   . Undiagnosed cardiac murmurs   . Unspecified constipation   . Unspecified late effects of cerebrovascular disease   . Unspecified vitamin D deficiency   . Urinary frequency    Past Surgical History:  Procedure Laterality Date  . CHOLECYSTECTOMY  1981  . COLONOSCOPY  2009  . KNEE ARTHROSCOPY Right   . PARTIAL HIP ARTHROPLASTY Right 06/18/2009   Dr. Delilah Shan  . PILONIDAL CYST EXCISION  1968   base of spine   Social History:   reports that he quit smoking about 8 years ago. He has never used smokeless tobacco. He reports that he drinks about 1.8 oz of alcohol per week . He reports that he does not use drugs.  Family History  Problem Relation Age of Onset  . Cancer Mother 95       colon , breast  . Colon cancer Mother   .  Heart attack Daughter        2008  . Parkinson's disease Sister     Medications: Patient's Medications  New Prescriptions   No medications on file  Previous Medications   BLOOD GLUCOSE MONITORING SUPPL (ONETOUCH VERIO) W/DEVICE KIT    by Does not apply route. Check blood sugar twice daily as directed DX E11.65   CHOLECALCIFEROL (VITAMIN D) 2000 UNITS CAPS    Take 1 capsule by mouth daily.     GLUCOSE BLOOD (ONETOUCH VERIO) TEST STRIP    Check blood sugar twice daily as directed E11.65   LISINOPRIL (PRINIVIL,ZESTRIL) 20 MG TABLET    TAKE ONE TABLET BY MOUTH ONCE DAILY FOR  BLOOD  PRESSURE   MELATONIN 5 MG CAPS    Take 1 capsule (5 mg total) by mouth at bedtime.   METFORMIN (GLUCOPHAGE) 500 MG TABLET    TAKE ONE TABLET BY MOUTH ONCE DAILY IN THE MORNING FOR  BLOOD  SUGAR  CONTROL   MISC NATURAL PRODUCTS (OSTEO BI-FLEX ADV TRIPLE ST PO)    Take by mouth daily.   NYSTATIN (MYCOSTATIN/NYSTOP) POWDER    100,000 g daily as needed. Apply to groin area for yeast/fungal infection   ONETOUCH DELICA LANCETS 10X MISC    1 each by Does not apply route 3 (three) times daily. Check blood sugar twice daily E11.65   SIMVASTATIN (ZOCOR) 20 MG TABLET    TAKE ONE TABLET BY MOUTH ONCE DAILY FOR  CHOLESTEROL   TRAMADOL (ULTRAM) 50 MG TABLET    TAKE 1 TABLET BY MOUTH 4 TIMES DAILY AS NEEDED FOR PAIN   TRIAMCINOLONE LOTION (KENALOG) 0.1 %    Use once daily as needed for skin irritation behind left ear   WARFARIN (COUMADIN) 10 MG TABLET    Take one tablet  by mouth daily along with a 5 mg tablet for a total of 15 mg daily   WARFARIN (COUMADIN) 5 MG TABLET    Take 5 mg along with 10 mg for a total of 69m daily.  Modified Medications   No medications on file  Discontinued Medications   No medications on file     Physical Exam:  Vitals:   06/04/17 1309  BP: 124/76  Pulse: 63  Resp: 17  Temp: 98 F (36.7 C)  TempSrc: Oral  SpO2: 97%  Weight: 238 lb 12.8 oz (108.3 kg)  Height: _0  (1.651 m)   Body  mass index is 39.74 kg/m.  Physical Exam  Constitutional: He is oriented to person, place, and time. He appears well-developed and well-nourished.  Obese  HENT:  Head: Normocephalic and atraumatic.  History of cerumen impactions  Eyes: Pupils are equal, round, and reactive to light. Conjunctivae and EOM are normal.   Corrective lenses  Neck: Normal range of motion. Neck supple.  Cardiovascular: Normal rate, regular rhythm and normal heart sounds.  Exam reveals no  gallop and no friction rub.   No murmur heard. Pulmonary/Chest: Effort normal and breath sounds normal. No respiratory distress. He has no wheezes. He has no rales.  Abdominal: Soft. Bowel sounds are normal. He exhibits no distension.  Musculoskeletal: He exhibits no edema or tenderness.  Neurological: He is alert and oriented to person, place, and time. No cranial nerve deficit.  Skin: Skin is warm and dry. No rash noted. No erythema. No pallor.  Psychiatric: He has a normal mood and affect. His behavior is normal. Thought content normal.    Labs reviewed: Basic Metabolic Panel: No results for input(s): NA, K, CL, CO2, GLUCOSE, BUN, CREATININE, CALCIUM, MG, PHOS, TSH in the last 8760 hours. Liver Function Tests: No results for input(s): AST, ALT, ALKPHOS, BILITOT, PROT, ALBUMIN in the last 8760 hours. No results for input(s): LIPASE, AMYLASE in the last 8760 hours. No results for input(s): AMMONIA in the last 8760 hours. CBC: No results for input(s): WBC, NEUTROABS, HGB, HCT, MCV, PLT in the last 8760 hours. Lipid Panel: No results for input(s): CHOL, HDL, LDLCALC, TRIG, CHOLHDL, LDLDIRECT in the last 8760 hours. TSH: No results for input(s): TSH in the last 8760 hours. A1C: Lab Results  Component Value Date   HGBA1C 6.1 (H) 05/23/2016     Assessment/Plan 1. Essential hypertension Blood pressure stable, conts on lisinopril with lifestyle modifications.   2. Type 2 diabetes mellitus without complication, without  long-term current use of insulin (HCC) Diabetic diet, with metformin 500 mg daily, blood sugar reported are in good range but he has not had A1c since last year.  - Hemoglobin A1c; Future - Ambulatory referral to Podiatry- pt with thick brittle toe nails.   3. Urinary frequency - PSA; Future  4. Insomnia, unspecified type Using OTC melatonin   5. Hyperlipidemia, unspecified hyperlipidemia type conts on simvastatin and lifestyle modifications.  - CMP with eGFR; Future - Lipid Panel; Future  6. Primary hypercoagulable state ([place]) -following with Barkley Boards D, INR on 06/01/17 which was at goal. conts on coumadin 15 mg daily  - CBC with Differential/Platelets; Future  Next appt: 6 months, to get fasting blood work next week.  Colton Miranda. Colton Miranda  [facility] [phone] 8 am - 5 pm) [phone] (after hours)

## 2017-06-04 NOTE — Patient Instructions (Signed)
Make lab appt for next week

## 2017-06-11 ENCOUNTER — Other Ambulatory Visit: Payer: PPO

## 2017-06-11 DIAGNOSIS — E785 Hyperlipidemia, unspecified: Secondary | ICD-10-CM | POA: Diagnosis not present

## 2017-06-11 DIAGNOSIS — R350 Frequency of micturition: Secondary | ICD-10-CM

## 2017-06-11 DIAGNOSIS — D6859 Other primary thrombophilia: Secondary | ICD-10-CM | POA: Diagnosis not present

## 2017-06-11 DIAGNOSIS — E119 Type 2 diabetes mellitus without complications: Secondary | ICD-10-CM | POA: Diagnosis not present

## 2017-06-12 ENCOUNTER — Other Ambulatory Visit: Payer: Self-pay | Admitting: Nurse Practitioner

## 2017-06-12 DIAGNOSIS — R972 Elevated prostate specific antigen [PSA]: Secondary | ICD-10-CM

## 2017-06-12 LAB — CBC WITH DIFFERENTIAL/PLATELET
Basophils Absolute: 59 cells/uL (ref 0–200)
Basophils Relative: 0.8 %
EOS PCT: 1.7 %
Eosinophils Absolute: 126 cells/uL (ref 15–500)
HEMATOCRIT: 42.6 % (ref 38.5–50.0)
Hemoglobin: 14 g/dL (ref 13.2–17.1)
LYMPHS ABS: 2464 {cells}/uL (ref 850–3900)
MCH: 27.7 pg (ref 27.0–33.0)
MCHC: 32.9 g/dL (ref 32.0–36.0)
MCV: 84.2 fL (ref 80.0–100.0)
MPV: 10.5 fL (ref 7.5–12.5)
Monocytes Relative: 8.2 %
Neutro Abs: 4144 cells/uL (ref 1500–7800)
Neutrophils Relative %: 56 %
PLATELETS: 231 10*3/uL (ref 140–400)
RBC: 5.06 10*6/uL (ref 4.20–5.80)
RDW: 13.4 % (ref 11.0–15.0)
TOTAL LYMPHOCYTE: 33.3 %
WBC: 7.4 10*3/uL (ref 3.8–10.8)
WBCMIX: 607 {cells}/uL (ref 200–950)

## 2017-06-12 LAB — COMPLETE METABOLIC PANEL WITH GFR
AG Ratio: 1.5 (calc) (ref 1.0–2.5)
ALKALINE PHOSPHATASE (APISO): 59 U/L (ref 40–115)
ALT: 21 U/L (ref 9–46)
AST: 20 U/L (ref 10–35)
Albumin: 4.2 g/dL (ref 3.6–5.1)
BUN: 20 mg/dL (ref 7–25)
CO2: 24 mmol/L (ref 20–32)
CREATININE: 1.16 mg/dL (ref 0.70–1.18)
Calcium: 9.5 mg/dL (ref 8.6–10.3)
Chloride: 103 mmol/L (ref 98–110)
GFR, Est African American: 71 mL/min/{1.73_m2} (ref >=60)
GFR, Est Non African American: 61 mL/min/{1.73_m2} (ref >=60)
GLUCOSE: 106 mg/dL — AB (ref 65–99)
Globulin: 2.8 g/dL (calc) (ref 1.9–3.7)
Potassium: 4.4 mmol/L (ref 3.5–5.3)
Sodium: 137 mmol/L (ref 135–146)
Total Bilirubin: 0.7 mg/dL (ref 0.2–1.2)
Total Protein: 7 g/dL (ref 6.1–8.1)

## 2017-06-12 LAB — LIPID PANEL
CHOL/HDL RATIO: 2.4 (calc) (ref <5.0)
Cholesterol: 111 mg/dL (ref <200)
HDL: 47 mg/dL (ref >40)
LDL Cholesterol (Calc): 49 mg/dL (calc)
NON-HDL CHOLESTEROL (CALC): 64 mg/dL (ref <130)
TRIGLYCERIDES: 70 mg/dL (ref <150)

## 2017-06-12 LAB — HEMOGLOBIN A1C
Hgb A1c MFr Bld: 6.3 % of total Hgb — ABNORMAL HIGH (ref <5.7)
Mean Plasma Glucose: 134 (calc)
eAG (mmol/L): 7.4 (calc)

## 2017-06-12 LAB — PSA: PSA: 14.1 ng/mL — AB (ref <=4.0)

## 2017-06-24 ENCOUNTER — Ambulatory Visit (INDEPENDENT_AMBULATORY_CARE_PROVIDER_SITE_OTHER): Payer: PPO | Admitting: Podiatry

## 2017-06-24 ENCOUNTER — Encounter: Payer: Self-pay | Admitting: Podiatry

## 2017-06-24 VITALS — BP 128/72 | HR 75

## 2017-06-24 DIAGNOSIS — E0842 Diabetes mellitus due to underlying condition with diabetic polyneuropathy: Secondary | ICD-10-CM | POA: Diagnosis not present

## 2017-06-24 DIAGNOSIS — M79676 Pain in unspecified toe(s): Secondary | ICD-10-CM

## 2017-06-24 DIAGNOSIS — B351 Tinea unguium: Secondary | ICD-10-CM

## 2017-06-27 NOTE — Progress Notes (Signed)
   SUBJECTIVE Patient with a history of diabetes mellitus presents to office today complaining of elongated, thickened nails. Pain while ambulating in shoes. Patient is unable to trim their own nails.   Past Medical History:  Diagnosis Date  . Acute, but ill-defined, cerebrovascular disease   . Basal cell carcinoma of skin of trunk, except scrotum   . Chronic airway obstruction, not elsewhere classified   . Diabetes mellitus 10/2010   T2DM, new dx   . Disturbance of skin sensation   . Edema   . Epistaxis   . Epistaxis   . 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 History of fall 07/25/2015  . Hypertension   . Hypertrophy of prostate with urinary obstruction and other lower urinary tract symptoms (LUTS)   . Impacted cerumen 02/23/13  . Long term (current) use of anticoagulants   . Memory loss   . Myalgia and myositis, unspecified   . Nonspecific abnormal electrocardiogram (ECG) (EKG)   . Obesity (BMI 30-39.9)   . Obesity, unspecified   . Osteoarthrosis, unspecified whether generalized or localized, unspecified site   . Other and unspecified disc disorder of lumbar region   . Other and unspecified hyperlipidemia   . Other pulmonary embolism and infarction 1988  . Pain in joint, lower leg   . Pain in limb   . Palpitations   . Primary hypercoagulable state ([place])   . Right bundle branch block   . Seborrheic dermatitis, unspecified   . Stiffness of joint, not elsewhere classified, unspecified site   . Stroke ([place])   . Tobacco use disorder   . Type II or unspecified type diabetes mellitus without mention of complication, not stated as uncontrolled   . Type II or unspecified type diabetes mellitus without mention of complication, uncontrolled   . Undiagnosed cardiac murmurs   . Unspecified constipation   . Unspecified late effects of cerebrovascular disease   . Unspecified vitamin D deficiency   . Urinary frequency     OBJECTIVE General Patient is awake, alert, and oriented x 3  and in no acute distress. Derm Skin is dry and supple bilateral. Negative open lesions or macerations. Remaining integument unremarkable. Nails are tender, long, thickened and dystrophic with subungual debris, consistent with onychomycosis, 1-5 bilateral. No signs of infection noted. Vasc  DP and PT pedal pulses palpable bilaterally. Temperature gradient within normal limits.  Neuro Epicritic and protective threshold sensation diminished bilaterally.  Musculoskeletal Exam No symptomatic pedal deformities noted bilateral. Muscular strength within normal limits.  ASSESSMENT 1. Diabetes Mellitus w/ peripheral neuropathy 2. Onychomycosis of nail due to dermatophyte bilateral 3. Pain in foot bilateral  PLAN OF CARE 1. Patient evaluated today. 2. Instructed to maintain good pedal hygiene and foot care. Stressed importance of controlling blood sugar.  3. Mechanical debridement of nails 1-5 bilaterally performed using a nail nipper. Filed with dremel without incident.  4. Return to clinic in 3 mos.     Edrick Kins, DPM [facility]  Dr. Edrick Kins, [address]                                        [place], [place] [address]                Office [phone]  Fax [phone]

## 2017-07-07 ENCOUNTER — Other Ambulatory Visit: Payer: Self-pay | Admitting: Nurse Practitioner

## 2017-07-07 DIAGNOSIS — D6859 Other primary thrombophilia: Secondary | ICD-10-CM

## 2017-07-07 DIAGNOSIS — Z7901 Long term (current) use of anticoagulants: Secondary | ICD-10-CM

## 2017-07-10 ENCOUNTER — Other Ambulatory Visit: Payer: Self-pay | Admitting: Nurse Practitioner

## 2017-07-10 DIAGNOSIS — D6859 Other primary thrombophilia: Secondary | ICD-10-CM

## 2017-07-10 DIAGNOSIS — Z7901 Long term (current) use of anticoagulants: Secondary | ICD-10-CM

## 2017-07-10 NOTE — Telephone Encounter (Signed)
Patient wife requested. Faxed.

## 2017-07-13 ENCOUNTER — Ambulatory Visit (INDEPENDENT_AMBULATORY_CARE_PROVIDER_SITE_OTHER): Payer: PPO | Admitting: Pharmacotherapy

## 2017-07-13 ENCOUNTER — Encounter: Payer: Self-pay | Admitting: Pharmacotherapy

## 2017-07-13 DIAGNOSIS — Z7901 Long term (current) use of anticoagulants: Secondary | ICD-10-CM | POA: Diagnosis not present

## 2017-07-13 DIAGNOSIS — D6859 Other primary thrombophilia: Secondary | ICD-10-CM | POA: Diagnosis not present

## 2017-07-13 LAB — POCT INR: INR: 2.1

## 2017-07-13 NOTE — Patient Instructions (Signed)
INR 2.1  Continue Coumadin 15mg  daily

## 2017-07-13 NOTE — Progress Notes (Signed)
   Subjective:    Patient ID: Colton Miranda, male    DOB: 12/10/1940, 76 y.o.   MRN: [id number]  HPI Last INR on 06/01/17 was OK at 2.9 Current Coumadin dose is 15mg  daily Denies missed doses. Denies unusual bleeding or bruising Denies CP, SOB, falls Consistent with vitamin K intake.   Review of Systems  HENT: Negative for nosebleeds.   Respiratory: Negative for shortness of breath.   Cardiovascular: Negative for chest pain.  Gastrointestinal: Negative for anal bleeding and blood in stool.  Genitourinary: Negative for hematuria.  Hematological: Does not bruise/bleed easily.       Objective:   Physical Exam  Constitutional: He is oriented to person, place, and time. He appears well-developed and well-nourished.  HENT:  Right Ear: External ear normal.  Left Ear: External ear normal.  Cardiovascular: Normal rate, regular rhythm and normal heart sounds.   Pulmonary/Chest: Effort normal and breath sounds normal.  Neurological: He is alert and oriented to person, place, and time.  Skin: Skin is warm and dry.  Psychiatric: He has a normal mood and affect. His behavior is normal. Judgment and thought content normal.  Vitals reviewed.   BP: 124/72  HR: 52  Wt: 243lb INR 2.1 Average BG: 109mg /dl No hypoglycemia     Assessment & Plan:  1.  INR at goal 2-3 2.  Continue Coumadin 15mg  daily 3.  RTC 1 month

## 2017-07-15 ENCOUNTER — Other Ambulatory Visit: Payer: Self-pay | Admitting: Internal Medicine

## 2017-07-15 DIAGNOSIS — B372 Candidiasis of skin and nail: Secondary | ICD-10-CM

## 2017-07-21 DIAGNOSIS — R972 Elevated prostate specific antigen [PSA]: Secondary | ICD-10-CM | POA: Diagnosis not present

## 2017-07-23 ENCOUNTER — Telehealth: Payer: Self-pay

## 2017-07-23 NOTE — Telephone Encounter (Signed)
Patient was off coumadin before and placed on Heparin when he has hip surgery in 2010, patient assumes he is on medication due to hypercoagulable state. Patient had a DVT and stoke at the same time when he was in [place] 19 years ago   Patient's wife when into a long rant about how she can not believe that we don't have all this information recorded and she recommends that we thoroughly review patient's records

## 2017-07-23 NOTE — Telephone Encounter (Signed)
Has he even been off coumadin before? I see that he is on this due to hypercoagulable state? Has he had any other clot other than that 1 PE?

## 2017-07-23 NOTE — Telephone Encounter (Signed)
Jennifer with [facility] called to inquire about patient stopping coumadin 5 days prior to in-office prostate biopsy.  Procedure not scheduled yet, need to confirm patient can come off coumadin first   Please advise

## 2017-07-24 NOTE — Telephone Encounter (Signed)
Noted, please set him up with [facility] for heparin start prior to biopsy

## 2017-07-24 NOTE — Telephone Encounter (Signed)
Colton Miranda at [phone] with [facility], left message on voicemail with status update.  Spoke with patient, schedule appointment for Monday with Oretha Ellis @ 10:00 am, OV not to be faxed to [facility] @ [phone]

## 2017-07-27 ENCOUNTER — Ambulatory Visit (INDEPENDENT_AMBULATORY_CARE_PROVIDER_SITE_OTHER): Payer: PPO | Admitting: Pharmacotherapy

## 2017-07-27 VITALS — BP 122/64 | HR 45 | Wt 243.0 lb

## 2017-07-27 DIAGNOSIS — D6859 Other primary thrombophilia: Secondary | ICD-10-CM

## 2017-07-27 DIAGNOSIS — Z7901 Long term (current) use of anticoagulants: Secondary | ICD-10-CM | POA: Diagnosis not present

## 2017-07-27 LAB — POCT INR: INR: 2.8

## 2017-07-27 MED ORDER — ENOXAPARIN SODIUM 100 MG/ML ~~LOC~~ SOLN: 100.0000 mg | Freq: Two times a day (BID) | SUBCUTANEOUS | 1 refills | Status: DC

## 2017-07-27 NOTE — Patient Instructions (Signed)
INR 2.8  5 days prior to procedure - stop Coumadin and start Enoxaparin 100mg  every 12 hours. If procedure is in the morning, no enoxaparin the evening before or morning of procedure. If procedure is in the afternoon, no enoxaparin morning of procedure. Evening of the procedure restart both the Coumadin 15mg  daily and the Enoxaparin 100mg  every 12 hours.  Overlap x 5 days.

## 2017-07-27 NOTE — Progress Notes (Signed)
   Subjective:    Patient ID: Colton Miranda, male    DOB: 1941/07/27, 76 y.o.   MRN: [id number]  HPI Last INR on 07/13/17 was OK at 2.1 Current coumadin dose is 15mg  daily. He is to have a prostate biopsy and will need to come off warfarin and be bridged with enoxaparin.   Review of Systems  HENT: Negative for nosebleeds.   Respiratory: Negative for shortness of breath.   Gastrointestinal: Negative for anal bleeding and blood in stool.  Genitourinary: Negative for hematuria.  Hematological: Does not bruise/bleed easily.       Objective:   Physical Exam  Constitutional: He is oriented to person, place, and time. He appears well-developed and well-nourished.  HENT:  Right Ear: External ear normal.  Left Ear: External ear normal.  Cardiovascular: Normal rate, regular rhythm and normal heart sounds.  Pulmonary/Chest: Effort normal and breath sounds normal.  Neurological: He is alert and oriented to person, place, and time.  Skin: Skin is warm and dry.  Psychiatric: He has a normal mood and affect. His behavior is normal. Thought content normal.  Vitals reviewed.  BP 122/64  HR: 45  Wt: 243 INR 2.8        Assessment & Plan:  1.  INR at goal 2-3 2.  Will stop warfarin 5 days prior to procedure (date unknown as of this visit).  Start enoxaparin 100mg  every 12 hours upon stopping warfarin.  If procedure is in the AM, will not take enoxaparin the evening before or morning of procedure.  If procedure is in the evening, will skip the morning only of enoxaparin.  Restart both enoxaparin and warfarin 15mg  evening of procedure.  Overlap enoxaparin and warfarin x 5 days post procedure.   3.  Keep scheduled INR check.

## 2017-08-10 ENCOUNTER — Telehealth: Payer: Self-pay | Admitting: *Deleted

## 2017-08-10 ENCOUNTER — Other Ambulatory Visit: Payer: Self-pay | Admitting: Nurse Practitioner

## 2017-08-10 DIAGNOSIS — E785 Hyperlipidemia, unspecified: Secondary | ICD-10-CM

## 2017-08-10 NOTE — Telephone Encounter (Signed)
Patient wife notified and agreed.  

## 2017-08-10 NOTE — Telephone Encounter (Signed)
Patient wife called and stated that patient's blood sugar spiked well over 200 this weekend and wanted to let Cathe[facility] know this. Stated he only ate 1 chocolate covered cherry. Stated that it was 275 twice. Stated it is back to normal now. She stated she wanted to let you know and see if you had any advise. Please Advise.   (printed message and gave to [facility].)

## 2017-08-10 NOTE — Telephone Encounter (Signed)
It appears that chocolate covered cherries are not his friend.  Try to avoid if possible.

## 2017-08-24 ENCOUNTER — Ambulatory Visit: Payer: PPO | Admitting: Pharmacotherapy

## 2017-08-24 ENCOUNTER — Encounter: Payer: Self-pay | Admitting: Pharmacotherapy

## 2017-08-24 DIAGNOSIS — D6859 Other primary thrombophilia: Secondary | ICD-10-CM

## 2017-08-24 DIAGNOSIS — I10 Essential (primary) hypertension: Secondary | ICD-10-CM | POA: Diagnosis not present

## 2017-08-24 DIAGNOSIS — Z7901 Long term (current) use of anticoagulants: Secondary | ICD-10-CM | POA: Diagnosis not present

## 2017-08-24 LAB — POCT INR: INR: 1.7

## 2017-08-24 MED ORDER — LISINOPRIL 20 MG PO TABS: ORAL_TABLET | ORAL | 1 refills | Status: DC

## 2017-08-24 NOTE — Patient Instructions (Signed)
INR 1.7  Increase Coumadin 17.5mg  on Mondays, 15mg  all other days.  Stop Coumadin Friday for biopsy.  Follow instructions of enoxaparin injections.

## 2017-08-24 NOTE — Progress Notes (Signed)
   Subjective:    Patient ID: Colton Miranda, male    DOB: [birth year], 76 y.o.   MRN: [id number]  HPI Last iNR on 07/27/17 was OK at 2.8 Current Coumadin dose is 15mg  daily Denies missed doses Denies unusual bleeding or bruising Denies CP, falls Consistent with vitamin K intake  Average BG:  132mg /dl No hypoglycemia.   Review of Systems  HENT: Negative for nosebleeds.   Respiratory: Negative for shortness of breath.   Cardiovascular: Negative for chest pain.  Gastrointestinal: Negative for anal bleeding and blood in stool.  Genitourinary: Negative for hematuria.  Hematological: Does not bruise/bleed easily.       Objective:   Physical Exam  Constitutional: He is oriented to person, place, and time. He appears well-developed and well-nourished.  HENT:  Right Ear: External ear normal.  Left Ear: External ear normal.  Cardiovascular: Normal rate, regular rhythm and normal heart sounds.  Pulmonary/Chest: Effort normal and breath sounds normal.  Neurological: He is alert and oriented to person, place, and time.  Skin: Skin is warm and dry.  Psychiatric: He has a normal mood and affect. His behavior is normal. Judgment and thought content normal.  Vitals reviewed.   BP: 104/62  HR: 72  Wt: 242lb INR 1.7      Assessment & Plan:  1.  INR below target 2-3 2.  Increase Coumadin 15mg  daily except 17.5mg  on Mondays. 3.  RTC in 1 month 4.  Biopsy scheduled for 09/01/17 afternoon.  Has bridge instructions.  To stop warfarin on Friday.

## 2017-09-01 DIAGNOSIS — R972 Elevated prostate specific antigen [PSA]: Secondary | ICD-10-CM | POA: Diagnosis not present

## 2017-09-01 DIAGNOSIS — C61 Malignant neoplasm of prostate: Secondary | ICD-10-CM | POA: Diagnosis not present

## 2017-09-11 ENCOUNTER — Other Ambulatory Visit: Payer: Self-pay | Admitting: Urology

## 2017-09-11 DIAGNOSIS — C61 Malignant neoplasm of prostate: Secondary | ICD-10-CM | POA: Diagnosis not present

## 2017-09-17 DIAGNOSIS — I452 Bifascicular block: Secondary | ICD-10-CM | POA: Diagnosis not present

## 2017-09-17 DIAGNOSIS — I10 Essential (primary) hypertension: Secondary | ICD-10-CM | POA: Diagnosis not present

## 2017-09-17 DIAGNOSIS — E785 Hyperlipidemia, unspecified: Secondary | ICD-10-CM | POA: Diagnosis not present

## 2017-09-17 DIAGNOSIS — R001 Bradycardia, unspecified: Secondary | ICD-10-CM | POA: Diagnosis not present

## 2017-09-21 ENCOUNTER — Ambulatory Visit (INDEPENDENT_AMBULATORY_CARE_PROVIDER_SITE_OTHER): Payer: PPO | Admitting: Pharmacotherapy

## 2017-09-21 ENCOUNTER — Encounter: Payer: Self-pay | Admitting: Pharmacotherapy

## 2017-09-21 DIAGNOSIS — Z7901 Long term (current) use of anticoagulants: Secondary | ICD-10-CM | POA: Diagnosis not present

## 2017-09-21 DIAGNOSIS — D6859 Other primary thrombophilia: Secondary | ICD-10-CM

## 2017-09-21 LAB — POCT INR: INR: 1.6

## 2017-09-21 NOTE — Progress Notes (Signed)
   Subjective:    Patient ID: Colton Miranda, male    DOB: June 13, 1941, 76 y.o.   MRN: [id number]  HPI  Last INR on 08/24/17 was low at 1.7.   He has been taking Coumadin 15mg  daily. Denies missed doses. Denies unusual bleeding or bruising. Denies CP, SOB, falls Consistent with vitamin K intake Average BG: 114mg /dl  Review of Systems  HENT: Negative for nosebleeds.   Respiratory: Negative for shortness of breath.   Cardiovascular: Negative for chest pain.  Gastrointestinal: Negative for anal bleeding and blood in stool.  Genitourinary: Negative for hematuria.  Hematological: Does not bruise/bleed easily.       Objective:   Physical Exam  Constitutional: He is oriented to person, place, and time. He appears well-developed and well-nourished.  HENT:  Right Ear: External ear normal.  Left Ear: External ear normal.  Cardiovascular: Normal rate, regular rhythm and normal heart sounds.  Pulmonary/Chest: Effort normal and breath sounds normal.  Neurological: He is alert and oriented to person, place, and time.  Skin: Skin is warm and dry.  Psychiatric: He has a normal mood and affect. His behavior is normal. Judgment and thought content normal.  Vitals reviewed.   BP: 132/70  HR: 51  Wt: 246lb INR 1.6      Assessment & Plan:  1.  INR below target 2-3 2.  Increase Coumadin 15mg  daily except 17.5mg  Mondays 3.  INR in 3 weeks

## 2017-09-22 DIAGNOSIS — I480 Paroxysmal atrial fibrillation: Secondary | ICD-10-CM

## 2017-09-22 HISTORY — DX: Paroxysmal atrial fibrillation: I48.0

## 2017-09-23 ENCOUNTER — Ambulatory Visit: Payer: PPO | Admitting: Podiatry

## 2017-10-02 ENCOUNTER — Ambulatory Visit (HOSPITAL_COMMUNITY)
Admission: RE | Admit: 2017-10-02 | Discharge: 2017-10-02 | Disposition: A | Discharge status: Home / Self Care | Payer: PPO | Source: Ambulatory Visit | Attending: Urology | Admitting: Urology

## 2017-10-02 ENCOUNTER — Encounter (HOSPITAL_COMMUNITY)
Admission: RE | Admit: 2017-10-02 | Discharge: 2017-10-02 | Disposition: A | Discharge status: Home / Self Care | Payer: PPO | Source: Ambulatory Visit | Attending: Urology | Admitting: Urology

## 2017-10-02 DIAGNOSIS — C61 Malignant neoplasm of prostate: Secondary | ICD-10-CM | POA: Diagnosis not present

## 2017-10-02 DIAGNOSIS — R937 Abnormal findings on diagnostic imaging of other parts of musculoskeletal system: Secondary | ICD-10-CM | POA: Diagnosis not present

## 2017-10-02 MED ORDER — TECHNETIUM TC 99M MEDRONATE IV KIT
25.0000 | PACK | Freq: Once | INTRAVENOUS | Status: AC | PRN
  Administered 2017-10-02: 19.6 via INTRAVENOUS

## 2017-10-04 ENCOUNTER — Other Ambulatory Visit: Payer: Self-pay | Admitting: Pharmacotherapy

## 2017-10-04 DIAGNOSIS — Z7901 Long term (current) use of anticoagulants: Secondary | ICD-10-CM

## 2017-10-06 ENCOUNTER — Encounter: Payer: Self-pay | Admitting: Radiation Oncology

## 2017-10-06 NOTE — Progress Notes (Addendum)
GU Location of Tumor / Histology: prostatic adenocarcinoma  If Prostate Cancer, Gleason Score is (4 + 4) and PSA is (14.1) at diagnosis. Prostate volume: 42.8 grams  Elizbeth Squires was referred by his PCP, Dr. Janine Limbo, to Dr. Alyson Ingles for further evaluation of an elevated PSA.  Biopsies of prostate (if applicable) revealed:    Past/Anticipated interventions by urology, if any: prostate biopsy, discussed firmagon, scheduled to receive Firmagon on 10/16/2017, ordered bone scan (negative), referred for radiation oncology  Past/Anticipated interventions by medical oncology, if any: no  Weight changes, if any: no  Bowel/Bladder complaints, if any: IPSS 6. Reports urinary frequency and erectile dysfunction. Denies dysuria or hematuria. Patient denies urinary leakage but wife differs.   Nausea/Vomiting, if any: no  Pain issues, if any:  Chronic arthritic leg and knee pain  SAFETY ISSUES:  Prior radiation? no  Pacemaker/ICD? no  Possible current pregnancy? no  Is the patient on methotrexate? no  Current Complaints / other details:  77 year old male. Married. Retired. Mother had hx of breast and colon ca.

## 2017-10-07 ENCOUNTER — Encounter: Payer: Self-pay | Admitting: Radiation Oncology

## 2017-10-07 ENCOUNTER — Ambulatory Visit
Admission: RE | Admit: 2017-10-07 | Discharge: 2017-10-07 | Disposition: A | Discharge status: Home / Self Care | Payer: PPO | Source: Ambulatory Visit | Attending: Radiation Oncology | Admitting: Radiation Oncology

## 2017-10-07 ENCOUNTER — Other Ambulatory Visit: Payer: Self-pay

## 2017-10-07 DIAGNOSIS — Z86711 Personal history of pulmonary embolism: Secondary | ICD-10-CM | POA: Diagnosis not present

## 2017-10-07 DIAGNOSIS — E669 Obesity, unspecified: Secondary | ICD-10-CM | POA: Insufficient documentation

## 2017-10-07 DIAGNOSIS — C61 Malignant neoplasm of prostate: Secondary | ICD-10-CM | POA: Diagnosis not present

## 2017-10-07 DIAGNOSIS — R972 Elevated prostate specific antigen [PSA]: Secondary | ICD-10-CM | POA: Diagnosis not present

## 2017-10-07 DIAGNOSIS — Z8673 Personal history of transient ischemic attack (TIA), and cerebral infarction without residual deficits: Secondary | ICD-10-CM | POA: Diagnosis not present

## 2017-10-07 DIAGNOSIS — Z7901 Long term (current) use of anticoagulants: Secondary | ICD-10-CM | POA: Insufficient documentation

## 2017-10-07 DIAGNOSIS — Z7984 Long term (current) use of oral hypoglycemic drugs: Secondary | ICD-10-CM | POA: Diagnosis not present

## 2017-10-07 DIAGNOSIS — I10 Essential (primary) hypertension: Secondary | ICD-10-CM | POA: Diagnosis not present

## 2017-10-07 DIAGNOSIS — Z96641 Presence of right artificial hip joint: Secondary | ICD-10-CM | POA: Insufficient documentation

## 2017-10-07 DIAGNOSIS — E559 Vitamin D deficiency, unspecified: Secondary | ICD-10-CM | POA: Insufficient documentation

## 2017-10-07 DIAGNOSIS — M1990 Unspecified osteoarthritis, unspecified site: Secondary | ICD-10-CM | POA: Insufficient documentation

## 2017-10-07 DIAGNOSIS — Z86718 Personal history of other venous thrombosis and embolism: Secondary | ICD-10-CM | POA: Diagnosis not present

## 2017-10-07 DIAGNOSIS — E119 Type 2 diabetes mellitus without complications: Secondary | ICD-10-CM | POA: Insufficient documentation

## 2017-10-07 DIAGNOSIS — J449 Chronic obstructive pulmonary disease, unspecified: Secondary | ICD-10-CM | POA: Insufficient documentation

## 2017-10-07 DIAGNOSIS — Z87891 Personal history of nicotine dependence: Secondary | ICD-10-CM | POA: Insufficient documentation

## 2017-10-07 DIAGNOSIS — E785 Hyperlipidemia, unspecified: Secondary | ICD-10-CM | POA: Diagnosis not present

## 2017-10-07 DIAGNOSIS — Z85828 Personal history of other malignant neoplasm of skin: Secondary | ICD-10-CM | POA: Insufficient documentation

## 2017-10-07 DIAGNOSIS — I4510 Unspecified right bundle-branch block: Secondary | ICD-10-CM | POA: Insufficient documentation

## 2017-10-07 DIAGNOSIS — Z79899 Other long term (current) drug therapy: Secondary | ICD-10-CM | POA: Insufficient documentation

## 2017-10-07 HISTORY — DX: Malignant neoplasm of prostate: C61

## 2017-10-07 NOTE — Progress Notes (Signed)
Radiation Oncology         [phone]) [phone] ________________________________  Initial outpatient Consultation  Name: Colton Miranda MRN: [id number]  Date: 10/07/2017  DOB: 04/26/1941  WC:[id number], Colton American, NP  McKenzie, Colton Furbish, MD   REFERRING PHYSICIAN: Cleon Gustin, MD  DIAGNOSIS: 77 y.o. gentleman with Stage T1c adenocarcinoma of the prostate with Gleason Score of 4+4, and PSA of 14.1    ICD-10-CM   1. Malignant neoplasm of prostate ([place]) C61    HISTORY OF PRESENT ILLNESS: Colton Miranda is a 77 y.o. male with a diagnosis of prostate cancer. He was noted to have an elevated PSA of 14.1 by his primary care physician, Dr. Janine Miranda.  Accordingly, he was referred for evaluation in urology by Dr. Alyson Miranda on 07/21/17, digital rectal examination was performed at that time revealing firmness in bilateral lobes of prostate without discrete nodule.  The patient proceeded to transrectal ultrasound with 12 biopsies of the prostate on 09/01/17.  The prostate volume measured 42.8 cc.  Out of 12 core biopsies,12 were positive.  The maximum Gleason score was 4+4, and this was seen in the right mid and right mid lateral.  Additionally, there was Gleason 4+3 disease in right apex, right base, left base, left base lateral, left mid lateral, left apex and left apex lateral as well as 3+4 disease in the right base lateral, right apex latera and left mid.  He had a bone scan and a CT abdomen/pelvis on 10/02/17 for disease staging which demonstrated degenerative changes in the thoracic spine, shoulders and pelvis but did not reveal any definite findings to suggest metastatic disease in the abdomen, pelvis or bony skeleton.    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  The patient reviewed the biopsy results with his urologist and he has kindly been referred today for discussion of potential radiation treatment  options.  PREVIOUS RADIATION THERAPY: No  PAST MEDICAL HISTORY:  Past Medical History:  Diagnosis Date  . Acute, but ill-defined, cerebrovascular disease   . Basal cell carcinoma of skin of trunk, except scrotum   . Chronic airway obstruction, not elsewhere classified   . Diabetes mellitus 10/2010   T2DM, new dx   . Disturbance of skin sensation   . Edema   . Epistaxis   . Epistaxis   . 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 History of DVT (deep vein thrombosis)    19 years ago as of 2018  . History of fall 07/25/2015  . Hypertension   . Hypertrophy of prostate with urinary obstruction and other lower urinary tract symptoms (LUTS)   . Impacted cerumen 02/23/13  . Long term (current) use of anticoagulants   . Memory loss   . Myalgia and myositis, unspecified   . Nonspecific abnormal electrocardiogram (ECG) (EKG)   . Obesity (BMI 30-39.9)   . Obesity, unspecified   . Osteoarthrosis, unspecified whether generalized or localized, unspecified site   . Other and unspecified disc disorder of lumbar region   . Other and unspecified hyperlipidemia   . Other pulmonary embolism and infarction 1988  . Pain in joint, lower leg   . Pain in limb   . Palpitations   . Primary hypercoagulable state ([place])   . Prostate cancer ([place])   . Right bundle branch block   . Seborrheic dermatitis, unspecified   . Stiffness of joint, not elsewhere classified,  unspecified site   . Stroke ([place])   . Tobacco use disorder   . Type II or unspecified type diabetes mellitus without mention of complication, not stated as uncontrolled   . Type II or unspecified type diabetes mellitus without mention of complication, uncontrolled   . Undiagnosed cardiac murmurs   . Unspecified constipation   . Unspecified late effects of cerebrovascular disease   . Unspecified vitamin D deficiency   . Urinary frequency       PAST SURGICAL HISTORY: Past Surgical History:  Procedure Laterality Date  . CHOLECYSTECTOMY  1981    . COLONOSCOPY  2009  . KNEE ARTHROSCOPY Right   . PARTIAL HIP ARTHROPLASTY Right 06/18/2009   Dr. Delilah Shan  . PILONIDAL CYST EXCISION  1968   base of spine  . PROSTATE BIOPSY  09/10/2017   + Pos for Prostate Cancer, Dr.MCkinzie( [facility])     FAMILY HISTORY:  Family History  Problem Relation Age of Onset  . Cancer Mother 76       colon , breast  . Colon cancer Mother   . Heart attack Daughter        2008  . Parkinson's disease Sister   . Cancer Sister        breast  . Cancer Maternal Uncle        melanoma  . Cancer Paternal Uncle     SOCIAL HISTORY:  Social History   Socioeconomic History  . Marital status: Married    Spouse name: Not on file  . Number of children: Not on file  . Years of education: Not on file  . Highest education level: Not on file  Social Needs  . Financial resource strain: Not on file  . Food insecurity - worry: Not on file  . Food insecurity - inability: Not on file  . Transportation needs - medical: Not on file  . Transportation needs - non-medical: Not on file  Occupational History  . Not on file  Tobacco Use  . Smoking status: Former Smoker    Years: 50.00    Types: Cigarettes    Last attempt to quit: 01/03/2009    Years since quitting: 8.7  . Smokeless tobacco: Never Used  Substance and Sexual Activity  . Alcohol use: Yes    Alcohol/week: 1.8 oz    Types: 3 Cans of beer per week    Comment: week  . Drug use: No  . Sexual activity: Not Currently    Partners: Female    Comment: wife  Other Topics Concern  . Not on file  Social History Narrative  . Not on file    ALLERGIES: Patient has no known allergies.  MEDICATIONS:  Current Outpatient Medications  Medication Sig Dispense Refill  . Blood Glucose Monitoring Suppl (ONETOUCH VERIO) w/Device KIT by Does not apply route. Check blood sugar twice daily as directed DX E11.65    . Cholecalciferol (VITAMIN D) 2000 UNITS CAPS Take 1 capsule by mouth daily.      Marland Kitchen glucose  blood (ONETOUCH VERIO) test strip Check blood sugar twice daily as directed E11.65 100 each 11  . lisinopril (PRINIVIL,ZESTRIL) 20 MG tablet TAKE ONE TABLET BY MOUTH ONCE DAILY FOR  BLOOD  PRESSURE 90 tablet 1  . Melatonin 5 MG CAPS Take 1 capsule (5 mg total) by mouth at bedtime.  0  . metFORMIN (GLUCOPHAGE) 500 MG tablet TAKE ONE TABLET BY MOUTH ONCE DAILY IN THE MORNING FOR  BLOOD  SUGAR  CONTROL 90 tablet  1  . Misc Natural Products (OSTEO BI-FLEX ADV TRIPLE ST PO) Take by mouth daily.    Marland Kitchen nystatin (MYCOSTATIN/NYSTOP) powder 100,000 g daily as needed. Apply to groin area for yeast/fungal infection    . NYSTATIN powder APPLY TO AFFECTED AREAS TWICE DAILY AS DIRECTED 1000000 g 0  . ONETOUCH DELICA LANCETS 15V MISC 1 each by Does not apply route 3 (three) times daily. Check blood sugar twice daily E11.65 100 each 12  . simvastatin (ZOCOR) 20 MG tablet TAKE 1 TABLET BY MOUTH ONCE DAILY FOR CHOLESTEROL 90 tablet 1  . traMADol (ULTRAM) 50 MG tablet TAKE 1 TABLET BY MOUTH 4 TIMES DAILY AS NEEDED FOR PAIN 120 tablet 0  . triamcinolone lotion (KENALOG) 0.1 % Use once daily as needed for skin irritation behind left ear    . warfarin (COUMADIN) 10 MG tablet TAKE 1 TABLET BY MOUTH ONCE DAILY ALONG  WITH  A  5  MG  TABLET  FOR  A  TOTAL  OF  15  MG  DAILY 90 tablet 1  . warfarin (COUMADIN) 5 MG tablet Take 5 mg along with 10 mg for a total of 77m daily.     No current facility-administered medications for this encounter.     REVIEW OF SYSTEMS:  On review of systems, the patient reports that he is doing well overall. He denies any chest pain, shortness of breath, cough, fevers, chills, night sweats, unintended weight changes. He denies any bowel disturbances, and denies abdominal pain, nausea or vomiting. He denies any new musculoskeletal or joint aches or pains. His IPSS was 6, indicating mild urinary symptoms. He is unable to complete sexual activity with most attempts. A complete review of systems is  obtained and is otherwise negative.    PHYSICAL EXAM:  Wt Readings from Last 3 Encounters:  10/07/17 242 lb (109.8 kg)  09/21/17 246 lb (111.6 kg)  08/24/17 242 lb (109.8 kg)   Temp Readings from Last 3 Encounters:  10/07/17 98 F (36.7 C) (Oral)  06/04/17 98 F (36.7 C) (Oral)  06/01/17 97.6 F (36.4 C) (Oral)   BP Readings from Last 3 Encounters:  10/07/17 107/67  09/21/17 132/70  08/24/17 104/62   Pulse Readings from Last 3 Encounters:  10/07/17 (!) 52  09/21/17 (!) 51  08/24/17 72   Pain Assessment Pain Score: 0-No pain(chronic arthritic pain/denies new pain)/10  In general this is a well appearing caucasian male in no acute distress. He is alert and oriented x4 and appropriate throughout the examination. HEENT reveals that the patient is normocephalic, atraumatic. EOMs are intact. PERRLA. Skin is intact without any evidence of gross lesions. Cardiovascular exam reveals a regular rate and rhythm, no clicks rubs or murmurs are auscultated. Chest is clear to auscultation bilaterally. Lymphatic assessment is performed and does not reveal any adenopathy in the cervical, supraclavicular, axillary, or inguinal chains. Abdomen has active bowel sounds in all quadrants and is intact. The abdomen is soft, non tender, non distended. Lower extremities are negative for pretibial pitting edema, deep calf tenderness, cyanosis or clubbing.   KPS = 90  100 - Normal; no complaints; no evidence of disease. 90   - Able to carry on normal activity; minor signs or symptoms of disease. 80   - Normal activity with effort; some signs or symptoms of disease. 725  - Cares for self; unable to carry on normal activity or to do active work. 60   - Requires occasional assistance, but is able  to care for most of his personal needs. 50   - Requires considerable assistance and frequent medical care. 17   - Disabled; requires special care and assistance. 65   - Severely disabled; hospital admission is  indicated although death not imminent. 40   - Very sick; hospital admission necessary; active supportive treatment necessary. 10   - Moribund; fatal processes progressing rapidly. 0     - Dead  Karnofsky DA, [place], Craver LS and [place] [facility] [phone]) The use of the nitrogen mustards in the palliative treatment of carcinoma: with particular reference to bronchogenic carcinoma Cancer 1 634-56  LABORATORY DATA:  Lab Results  Component Value Date   WBC 7.4 06/11/2017   HGB 14.0 06/11/2017   HCT 42.6 06/11/2017   MCV 84.2 06/11/2017   PLT 231 06/11/2017   Lab Results  Component Value Date   NA 137 06/11/2017   K 4.4 06/11/2017   CL 103 06/11/2017   CO2 24 06/11/2017   Lab Results  Component Value Date   ALT 21 06/11/2017   AST 20 06/11/2017   ALKPHOS 57 05/23/2016   BILITOT 0.7 06/11/2017     RADIOGRAPHY: Nm Bone Scan Whole Body  Result Date: 10/02/2017 CLINICAL DATA:  Prostate cancer EXAM: NUCLEAR MEDICINE WHOLE BODY BONE SCAN TECHNIQUE: Whole body anterior and posterior images were obtained approximately 3 hours after intravenous injection of radiopharmaceutical. RADIOPHARMACEUTICALS:  19.6 mCi Technetium-67mMDP IV COMPARISON:  CT 10/02/2017 radiographs from 2,010 FINDINGS: Physiologic renal and bladder activity noted. Photopenic defect at the proximal right femur, corresponding to the right hip replacement. Symmetrical activity at the bilateral shoulders and knees, suspect for degenerative arthritis. Mildly prominent sternoclavicular activity likely degenerative. Mild left-sided lower lumbar activity, corresponds to degenerative changes noted on recent CT. Multiple foci of activity in the midthoracic spine. No correlate for left iliac bone sclerotic focus. IMPRESSION: 1. Multiple foci of activity in the mid thoracic spine, suspect degenerative changes given findings in the spine on recent CT imaging of abdomen and pelvis, however suggest correlation with radiographs 2. Bilateral  shoulder, knee, and sternoclavicular activity suspected to be secondary to arthritis. Electronically Signed   By: KDonavan FoilM.D.   On: 10/02/2017 19:38      IMPRESSION/PLAN: 1. 77y.o. gentleman with Stage T1c adenocarcinoma of the prostate with Gleason Score of 4+4, and PSA of 14.1. We discussed the patient's workup and outlined the nature of prostate cancer in this setting. The patient's T stage, Gleason's score, and PSA put him into the high risk group. Accordingly, he is eligible for a variety of potential treatment options including LT-ADT in combination with either 8 weeks of external radiation or 5 weeks of external radiation followed by a brachytherapy boost. We discussed the available radiation techniques, and focused on the details and logistics and delivery. We discussed and outlined the risks, benefits, short and long-term effects associated with radiotherapy and compared and contrasted these with prostatectomy. We discussed the role of SpaceOAR in reducing the rectal toxicity associated with radiotherapy. We also detailed the role of ADT in the treatment of high risk prostate cancer and outlined the associated side effects that could be expected with this therapy.  He was encouraged to ask questions regarding treatment recommendations which were answered to his stated satisfaction.  At the end of our conversation, the patient elects to proceed with LT-ADT in combination with 8 weeks of external beam radiotherapy.  He has a scheduled follow-up appointment with Dr. MAlyson Ingleson 10/16/2017 to  initiate ADT.  We will plan to begin radiotherapy approximately 2 months after the start of ADT. We will share our findings with Dr. Alyson Miranda and move forward with coordinating placement of fiducial markers and SpaceOAR in the outpatient surgical setting prior to [place].  We will schedule CT simulation in mid-March in anticipation of beginning radiotherapy the week of March 25th.  He appears to have a  good understanding of his disease and treatment recommendations.  We enjoyed meeting with he and his wife and daughter today in the office and look forward to participating in his care.  We spent 60 minutes minutes face to face with the patient and more than 50% of that time was spent in counseling and/or coordination of care.    Nicholos Johns, PA-C    Tyler Pita, MD  [facility] Direct Dial: [phone]  Fax: [phone] [email or web address]  Skype  LinkedIn

## 2017-10-07 NOTE — Progress Notes (Signed)
See progress note under physician encounter. 

## 2017-10-12 ENCOUNTER — Ambulatory Visit (INDEPENDENT_AMBULATORY_CARE_PROVIDER_SITE_OTHER): Payer: PPO | Admitting: Pharmacotherapy

## 2017-10-12 ENCOUNTER — Encounter: Payer: Self-pay | Admitting: Pharmacotherapy

## 2017-10-12 VITALS — BP 119/70 | HR 72 | Resp 10 | Ht 65.0 in | Wt 243.0 lb

## 2017-10-12 DIAGNOSIS — Z7901 Long term (current) use of anticoagulants: Secondary | ICD-10-CM

## 2017-10-12 DIAGNOSIS — D6859 Other primary thrombophilia: Secondary | ICD-10-CM

## 2017-10-12 LAB — POCT INR: INR: 1.7

## 2017-10-12 MED ORDER — NYSTATIN 100000 UNIT/GM EX POWD: 100000.0000 g | Freq: Every day | CUTANEOUS | 0 refills | Status: DC | PRN

## 2017-10-12 NOTE — Progress Notes (Signed)
   Subjective:    Patient ID: Colton Miranda, male    DOB: 21-Sep-1941, 77 y.o.   MRN: [id number]  HPI Last INR was low, and Coumadin was increased to 15mg  QD except 17.5mg  Mondays. Denies missed doses. Denies unusual bleeding or bruising. Denies CP, SOB, falls Consistent with vitamin K intake.  Bone scan was unremarkable. To meet with oncology on Friday to discuss hormone therapy. Plan is to start radiation by end of February.  Average BG: 110mg /dl No hypoglycemia   Review of Systems  HENT: Negative for nosebleeds.   Respiratory: Negative for shortness of breath.   Cardiovascular: Negative for chest pain.  Gastrointestinal: Negative for anal bleeding and blood in stool.  Genitourinary: Negative for hematuria.  Hematological: Does not bruise/bleed easily.       Objective:   Physical Exam  Constitutional: He is oriented to person, place, and time. He appears well-developed and well-nourished.  HENT:  Right Ear: External ear normal.  Left Ear: External ear normal.  Pulmonary/Chest: Effort normal.  Neurological: He is alert and oriented to person, place, and time.  Skin: Skin is warm and dry.  Psychiatric: He has a normal mood and affect. His behavior is normal. Judgment and thought content normal.  Vitals reviewed.   BP: 119/70  HR: 72  Wt: 248lb INR 1.7      Assessment & Plan:  1.  Increase Coumadin 15mg  QD except 17.5mg  M/F 2.  RTC in 2 weeks

## 2017-10-12 NOTE — Patient Instructions (Signed)
Increase Coumadin 15mg  daily except 17.5mg  on Mondays and Fridays

## 2017-10-16 ENCOUNTER — Other Ambulatory Visit: Payer: Self-pay | Admitting: Urology

## 2017-10-16 ENCOUNTER — Encounter: Payer: Self-pay | Admitting: Medical Oncology

## 2017-10-16 ENCOUNTER — Telehealth: Payer: Self-pay | Admitting: *Deleted

## 2017-10-16 DIAGNOSIS — C61 Malignant neoplasm of prostate: Secondary | ICD-10-CM | POA: Diagnosis not present

## 2017-10-16 NOTE — Telephone Encounter (Signed)
Called patient to inform of fid. Marker and space oar placement on 11-16-17 @ 4 pm @ [place] and his sim on 12-02-17 @ Dr. Johny Shears Office, lvm for a return call

## 2017-10-21 ENCOUNTER — Telehealth: Payer: Self-pay | Admitting: Nurse Practitioner

## 2017-10-21 NOTE — Telephone Encounter (Signed)
Left message asking patient to come at 12:15 on 12/02/17 for AWV w/ nurse before seeing Janett Billow. VDM (DD)

## 2017-10-22 ENCOUNTER — Other Ambulatory Visit: Payer: Self-pay | Admitting: Nurse Practitioner

## 2017-10-23 ENCOUNTER — Other Ambulatory Visit: Payer: Self-pay | Admitting: Urology

## 2017-10-23 DIAGNOSIS — C61 Malignant neoplasm of prostate: Secondary | ICD-10-CM

## 2017-10-26 ENCOUNTER — Encounter: Payer: Self-pay | Admitting: Pharmacotherapy

## 2017-10-26 ENCOUNTER — Telehealth: Payer: Self-pay | Admitting: Radiation Oncology

## 2017-10-26 ENCOUNTER — Ambulatory Visit (INDEPENDENT_AMBULATORY_CARE_PROVIDER_SITE_OTHER): Payer: PPO | Admitting: Pharmacotherapy

## 2017-10-26 DIAGNOSIS — D6859 Other primary thrombophilia: Secondary | ICD-10-CM

## 2017-10-26 DIAGNOSIS — Z7901 Long term (current) use of anticoagulants: Secondary | ICD-10-CM

## 2017-10-26 LAB — POCT INR: INR: 2

## 2017-10-26 MED ORDER — ENOXAPARIN SODIUM 100 MG/ML ~~LOC~~ SOLN: 100.0000 mg | Freq: Two times a day (BID) | SUBCUTANEOUS | 2 refills | Status: DC

## 2017-10-26 MED ORDER — NYSTATIN 100000 UNIT/GM EX POWD: 100000.0000 g | Freq: Every day | CUTANEOUS | 0 refills | Status: DC | PRN

## 2017-10-26 NOTE — Telephone Encounter (Signed)
Received voicemail message from patient's wife, Threasa Beards, inquiring about 3/13 appointment for her husband. Phoned her back and explained the CT/Simulation process and need. She verbalized understanding and expressed appreciation for the call.

## 2017-10-26 NOTE — Progress Notes (Signed)
   Subjective:    Patient ID: Colton Miranda, male    DOB: [birth year], 77 y.o.   MRN: [id number]  HPI  Last INR on 10/12/17 was low at 1.7. Coumadin was increased to 15mg  QD except 17.5mg  M/F Denies missed doses Denies unusual bleeding or bruising Denies CP, SOB, falls Consistent with vitamin K intake.  To have next Lupron 11/16/17. To have the "seeds" implanted 11/16/17.  Will need to be off warfarin 5 days prior.  Will need enoxaparin bridge.  Average BG: 110mg /dl No hypoglycemia    Review of Systems  HENT: Negative for nosebleeds.   Respiratory: Negative for shortness of breath.   Cardiovascular: Negative for chest pain.  Gastrointestinal: Negative for anal bleeding and blood in stool.  Genitourinary: Negative for hematuria.  Hematological: Does not bruise/bleed easily.       Objective:   Physical Exam  Constitutional: He is oriented to person, place, and time. He appears well-developed and well-nourished.  HENT:  Right Ear: External ear normal.  Left Ear: External ear normal.  Cardiovascular: Normal rate, regular rhythm and normal heart sounds.  Pulmonary/Chest: Effort normal and breath sounds normal.  Neurological: He is alert and oriented to person, place, and time.  Skin: Skin is warm and dry.  Psychiatric: He has a normal mood and affect. His behavior is normal. Judgment and thought content normal.  Vitals reviewed.    BP: 122/78  HR: 49  Wt: 240lb INR 2.0     Assessment & Plan:  1.  INR at goal 2-3 2.  Continue Coumadin 15mg  daily except 17.5mg  Mondays / Friday 3.  Will need enoxaparin bridge therapy as he did before his last biopsy for prostate procedure.  He knows how to administer enoxaparin.  Will stop warfarin 5 days prior to procedure and start enoxaparin 100mg  every 12 hours.  No enoxaparin morning of procedure (procedure scheduled for 3pm).  Restart enoxaparin and warfarin evening of procendure. 4.  RTC 1 week post procedure.

## 2017-11-03 ENCOUNTER — Other Ambulatory Visit: Payer: Self-pay | Admitting: Urology

## 2017-11-04 MED ORDER — FLEET ENEMA 7-19 GM/118ML RE ENEM
1.0000 | ENEMA | Freq: Once | RECTAL | Status: DC
  Filled 2017-11-04: qty 1

## 2017-11-06 ENCOUNTER — Other Ambulatory Visit: Payer: Self-pay

## 2017-11-06 ENCOUNTER — Encounter (HOSPITAL_BASED_OUTPATIENT_CLINIC_OR_DEPARTMENT_OTHER): Payer: Self-pay | Admitting: *Deleted

## 2017-11-06 NOTE — Patient Instructions (Signed)
  CLEAR LIQUID DIET   Foods Allowed                                                                     Foods Excluded  Coffee and tea, regular and decaf                             liquids that you cannot  Plain Jell-O in any flavor                                             see through such as: Fruit ices (not with fruit pulp)                                     milk, soups, orange juice  Iced Popsicles                                    All solid food Carbonated beverages, regular and diet                                    Cranberry, grape and apple juices Sports drinks like Gatorade Lightly seasoned clear broth or consume(fat free) Sugar, honey syrup  Sample Menu Breakfast                                Lunch                                     Supper Cranberry juice                    Beef broth                            Chicken broth Jell-O                                     Grape juice                           Apple juice Coffee or tea                        Jell-O                                      Popsicle                                                  Coffee or tea                        Coffee or tea  _____________________________________________________________________   

## 2017-11-06 NOTE — Progress Notes (Addendum)
SPOKE W/ PT AND WIFE VIA PHONE FOR PRE-OP INTERVIEW.  NPO AFTER MN W/ EXCEPTION CLEAR LIQUIDS UNTIL 1000 (NO CREAM/ MILK PRODUCTS).  ARRIVE AT 1400.  NEEDS ISTAT AND EKG.  PER PT AND IN Epic WAS GIVEN CLEARANCE ABD INSTRUCTIONS TO STOP COUMADIN AND BRIDGE W/ LOVENOX INJECTION BY PHARMACIST AT [facility], IN CHART AND Epic.

## 2017-11-15 MED ORDER — GENTAMICIN SULFATE 40 MG/ML IJ SOLN
5.0000 mg/kg | INTRAVENOUS | Status: AC
  Administered 2017-11-16: 410 mg via INTRAVENOUS
  Filled 2017-11-15 (×2): qty 10.25

## 2017-11-16 ENCOUNTER — Encounter (HOSPITAL_BASED_OUTPATIENT_CLINIC_OR_DEPARTMENT_OTHER): Payer: Self-pay

## 2017-11-16 ENCOUNTER — Ambulatory Visit (HOSPITAL_BASED_OUTPATIENT_CLINIC_OR_DEPARTMENT_OTHER): Payer: PPO | Admitting: Anesthesiology

## 2017-11-16 ENCOUNTER — Ambulatory Visit (HOSPITAL_BASED_OUTPATIENT_CLINIC_OR_DEPARTMENT_OTHER)
Admission: RE | Admit: 2017-11-16 | Discharge: 2017-11-16 | Disposition: A | Discharge status: Home / Self Care | Payer: PPO | Source: Ambulatory Visit | Attending: Urology | Admitting: Urology

## 2017-11-16 ENCOUNTER — Encounter (HOSPITAL_BASED_OUTPATIENT_CLINIC_OR_DEPARTMENT_OTHER)
Admission: RE | Disposition: A | Discharge status: Home / Self Care | Payer: Self-pay | Source: Ambulatory Visit | Attending: Urology

## 2017-11-16 ENCOUNTER — Ambulatory Visit (HOSPITAL_COMMUNITY): Admission: RE | Admit: 2017-11-16 | Payer: PPO | Source: Ambulatory Visit | Admitting: Urology

## 2017-11-16 ENCOUNTER — Other Ambulatory Visit: Payer: Self-pay

## 2017-11-16 DIAGNOSIS — Z85828 Personal history of other malignant neoplasm of skin: Secondary | ICD-10-CM | POA: Insufficient documentation

## 2017-11-16 DIAGNOSIS — Z86718 Personal history of other venous thrombosis and embolism: Secondary | ICD-10-CM | POA: Insufficient documentation

## 2017-11-16 DIAGNOSIS — Z5111 Encounter for antineoplastic chemotherapy: Secondary | ICD-10-CM | POA: Diagnosis not present

## 2017-11-16 DIAGNOSIS — Z7984 Long term (current) use of oral hypoglycemic drugs: Secondary | ICD-10-CM | POA: Diagnosis not present

## 2017-11-16 DIAGNOSIS — C61 Malignant neoplasm of prostate: Secondary | ICD-10-CM | POA: Insufficient documentation

## 2017-11-16 DIAGNOSIS — E785 Hyperlipidemia, unspecified: Secondary | ICD-10-CM | POA: Insufficient documentation

## 2017-11-16 DIAGNOSIS — Z8673 Personal history of transient ischemic attack (TIA), and cerebral infarction without residual deficits: Secondary | ICD-10-CM | POA: Insufficient documentation

## 2017-11-16 DIAGNOSIS — I10 Essential (primary) hypertension: Secondary | ICD-10-CM | POA: Diagnosis not present

## 2017-11-16 DIAGNOSIS — E119 Type 2 diabetes mellitus without complications: Secondary | ICD-10-CM | POA: Diagnosis not present

## 2017-11-16 DIAGNOSIS — Z87891 Personal history of nicotine dependence: Secondary | ICD-10-CM | POA: Diagnosis not present

## 2017-11-16 DIAGNOSIS — J449 Chronic obstructive pulmonary disease, unspecified: Secondary | ICD-10-CM | POA: Insufficient documentation

## 2017-11-16 DIAGNOSIS — M1990 Unspecified osteoarthritis, unspecified site: Secondary | ICD-10-CM | POA: Insufficient documentation

## 2017-11-16 DIAGNOSIS — I4510 Unspecified right bundle-branch block: Secondary | ICD-10-CM | POA: Diagnosis not present

## 2017-11-16 DIAGNOSIS — Z6839 Body mass index (BMI) 39.0-39.9, adult: Secondary | ICD-10-CM | POA: Diagnosis not present

## 2017-11-16 DIAGNOSIS — Z96641 Presence of right artificial hip joint: Secondary | ICD-10-CM | POA: Diagnosis not present

## 2017-11-16 DIAGNOSIS — Z8546 Personal history of malignant neoplasm of prostate: Secondary | ICD-10-CM

## 2017-11-16 DIAGNOSIS — Z86711 Personal history of pulmonary embolism: Secondary | ICD-10-CM | POA: Diagnosis not present

## 2017-11-16 DIAGNOSIS — Z7901 Long term (current) use of anticoagulants: Secondary | ICD-10-CM | POA: Diagnosis not present

## 2017-11-16 HISTORY — DX: Personal history of other malignant neoplasm of skin: Z85.828

## 2017-11-16 HISTORY — DX: Unspecified osteoarthritis, unspecified site: M19.90

## 2017-11-16 HISTORY — DX: Injury of unspecified nerves of neck, initial encounter: S14.9XXA

## 2017-11-16 HISTORY — DX: Nocturia: R35.1

## 2017-11-16 HISTORY — PX: SPACE OAR INSTILLATION: SHX6769

## 2017-11-16 HISTORY — DX: Type 2 diabetes mellitus without complications: E11.9

## 2017-11-16 HISTORY — DX: Other constipation: K59.09

## 2017-11-16 HISTORY — DX: Presence of spectacles and contact lenses: Z97.3

## 2017-11-16 HISTORY — DX: Personal history of colonic polyps: Z86.010

## 2017-11-16 HISTORY — DX: Hyperlipidemia, unspecified: E78.5

## 2017-11-16 HISTORY — DX: Personal history of adenomatous and serrated colon polyps: Z86.0101

## 2017-11-16 HISTORY — DX: Personal history of transient ischemic attack (TIA), and cerebral infarction without residual deficits: Z86.73

## 2017-11-16 HISTORY — DX: Benign prostatic hyperplasia with lower urinary tract symptoms: N40.1

## 2017-11-16 HISTORY — DX: Personal history of pulmonary embolism: Z86.711

## 2017-11-16 HISTORY — DX: Chronic obstructive pulmonary disease, unspecified: J44.9

## 2017-11-16 HISTORY — DX: Mononeuropathy, unspecified: G58.9

## 2017-11-16 HISTORY — PX: GOLD SEED IMPLANT: SHX6343

## 2017-11-16 LAB — POCT I-STAT, CHEM 8
BUN: 20 mg/dL (ref 6–20)
Calcium, Ion: 1.26 mmol/L (ref 1.15–1.40)
Chloride: 102 mmol/L (ref 101–111)
Creatinine, Ser: 0.9 mg/dL (ref 0.61–1.24)
GLUCOSE: 115 mg/dL — AB (ref 65–99)
HCT: 39 % (ref 39.0–52.0)
Hemoglobin: 13.3 g/dL (ref 13.0–17.0)
POTASSIUM: 4 mmol/L (ref 3.5–5.1)
Sodium: 139 mmol/L (ref 135–145)
TCO2: 24 mmol/L (ref 22–32)

## 2017-11-16 LAB — GLUCOSE, CAPILLARY: GLUCOSE-CAPILLARY: 101 mg/dL — AB (ref 65–99)

## 2017-11-16 SURGERY — INSERTION, GOLD SEEDS
Anesthesia: General | Site: Rectum

## 2017-11-16 MED ORDER — LACTATED RINGERS IV SOLN
INTRAVENOUS | Status: DC
  Administered 2017-11-16: 15:00:00 via INTRAVENOUS
  Filled 2017-11-16: qty 1000

## 2017-11-16 MED ORDER — ONDANSETRON HCL 4 MG/2ML IJ SOLN
INTRAMUSCULAR | Status: AC
  Filled 2017-11-16: qty 2

## 2017-11-16 MED ORDER — DEXAMETHASONE SODIUM PHOSPHATE 10 MG/ML IJ SOLN
INTRAMUSCULAR | Status: AC
  Filled 2017-11-16: qty 1

## 2017-11-16 MED ORDER — ACETAMINOPHEN 325 MG PO TABS
325.0000 mg | ORAL_TABLET | ORAL | Status: DC | PRN
  Filled 2017-11-16: qty 2

## 2017-11-16 MED ORDER — DEXAMETHASONE SODIUM PHOSPHATE 4 MG/ML IJ SOLN
INTRAMUSCULAR | Status: DC | PRN
  Administered 2017-11-16: 10 mg via INTRAVENOUS

## 2017-11-16 MED ORDER — EPHEDRINE 5 MG/ML INJ
INTRAVENOUS | Status: AC
  Filled 2017-11-16: qty 10

## 2017-11-16 MED ORDER — FENTANYL CITRATE (PF) 100 MCG/2ML IJ SOLN
INTRAMUSCULAR | Status: DC | PRN
  Administered 2017-11-16 (×2): 25 ug via INTRAVENOUS

## 2017-11-16 MED ORDER — ONDANSETRON HCL 4 MG/2ML IJ SOLN
INTRAMUSCULAR | Status: DC | PRN
  Administered 2017-11-16: 4 mg via INTRAVENOUS

## 2017-11-16 MED ORDER — OXYCODONE HCL 5 MG/5ML PO SOLN
5.0000 mg | Freq: Once | ORAL | Status: DC | PRN
  Filled 2017-11-16: qty 5

## 2017-11-16 MED ORDER — TRAMADOL HCL 50 MG PO TABS: 50.0000 mg | ORAL_TABLET | Freq: Four times a day (QID) | ORAL | 0 refills | Status: DC | PRN

## 2017-11-16 MED ORDER — OXYCODONE HCL 5 MG PO TABS
5.0000 mg | ORAL_TABLET | Freq: Once | ORAL | Status: DC | PRN
  Filled 2017-11-16: qty 1

## 2017-11-16 MED ORDER — FENTANYL CITRATE (PF) 100 MCG/2ML IJ SOLN
INTRAMUSCULAR | Status: AC
  Filled 2017-11-16: qty 2

## 2017-11-16 MED ORDER — PROPOFOL 10 MG/ML IV BOLUS
INTRAVENOUS | Status: DC | PRN
  Administered 2017-11-16: 150 mg via INTRAVENOUS

## 2017-11-16 MED ORDER — LIDOCAINE 2% (20 MG/ML) 5 ML SYRINGE
INTRAMUSCULAR | Status: DC | PRN
  Administered 2017-11-16: 100 mg via INTRAVENOUS

## 2017-11-16 MED ORDER — ACETAMINOPHEN 160 MG/5ML PO SOLN
325.0000 mg | ORAL | Status: DC | PRN
  Filled 2017-11-16: qty 20.3

## 2017-11-16 MED ORDER — FENTANYL CITRATE (PF) 100 MCG/2ML IJ SOLN
25.0000 ug | INTRAMUSCULAR | Status: DC | PRN
  Filled 2017-11-16: qty 1

## 2017-11-16 MED ORDER — PROPOFOL 10 MG/ML IV BOLUS
INTRAVENOUS | Status: AC
  Filled 2017-11-16: qty 40

## 2017-11-16 MED ORDER — SODIUM CHLORIDE FLUSH 0.9 % IV SOLN
INTRAVENOUS | Status: DC | PRN
  Administered 2017-11-16: 10 mL via INTRAVENOUS

## 2017-11-16 MED ORDER — LIDOCAINE 2% (20 MG/ML) 5 ML SYRINGE
INTRAMUSCULAR | Status: AC
Start: 2017-11-16 — End: ?
  Filled 2017-11-16: qty 5

## 2017-11-16 SURGICAL SUPPLY — 18 items
COVER TABLE BACK 60X90 (DRAPES) IMPLANT
DRSG TEGADERM 4X4.75 (GAUZE/BANDAGES/DRESSINGS) ×4 IMPLANT
DRSG TEGADERM 8X12 (GAUZE/BANDAGES/DRESSINGS) ×4 IMPLANT
GAUZE SPONGE 4X4 12PLY STRL (GAUZE/BANDAGES/DRESSINGS) ×4 IMPLANT
GLOVE BIO SURGEON STRL SZ8 (GLOVE) ×4 IMPLANT
GLOVE BIOGEL PI IND STRL 8.5 (GLOVE) ×2 IMPLANT
GLOVE BIOGEL PI INDICATOR 8.5 (GLOVE) ×2
GLOVE ECLIPSE 8.0 STRL XLNG CF (GLOVE) ×8 IMPLANT
GLOVE INDICATOR 8.5 STRL (GLOVE) ×4 IMPLANT
GOWN STRL REUS W/TWL XL LVL3 (GOWN DISPOSABLE) ×8 IMPLANT
IMPL SPACEOAR SYSTEM 10ML (MISCELLANEOUS) ×2 IMPLANT
IMPLANT SPACEOAR SYSTEM 10ML (MISCELLANEOUS) ×4
KIT TURNOVER CYSTO (KITS) ×4 IMPLANT
MARKER GOLD PRELOAD 1.2X3 (Urological Implant) ×2 IMPLANT
PACK CYSTO (CUSTOM PROCEDURE TRAY) ×4 IMPLANT
SEED GOLD PRELOAD 1.2X3 (Urological Implant) ×4 IMPLANT
SURGILUBE 2OZ TUBE FLIPTOP (MISCELLANEOUS) ×4 IMPLANT
UNDERPAD 30X30 (UNDERPADS AND DIAPERS) ×8 IMPLANT

## 2017-11-16 NOTE — Anesthesia Preprocedure Evaluation (Signed)
Anesthesia Evaluation  Patient identified by MRN, date of birth, ID band Patient awake    Reviewed: Allergy & Precautions, NPO status , Patient's Chart, lab work & pertinent test results  History of Anesthesia Complications Negative for: history of anesthetic complications  Airway Mallampati: II  TM Distance: >3 FB Neck ROM: Full    Dental  (+) Edentulous Upper, Missing,    Pulmonary COPD, former smoker,    breath sounds clear to auscultation       Cardiovascular hypertension, Pt. on medications (-) angina(-) CHF (-) dysrhythmias  Rhythm:Regular     Neuro/Psych  Neuromuscular disease CVA, No Residual Symptoms    GI/Hepatic negative GI ROS, Neg liver ROS,   Endo/Other  diabetes, Type 2Morbid obesity  Renal/GU      Musculoskeletal  (+) Arthritis ,   Abdominal   Peds  Hematology Hypercoagulable state with h/o blood clots, on coumadin with lovenox bridge for surgery   Anesthesia Other Findings   Reproductive/Obstetrics                             Anesthesia Physical Anesthesia Plan  ASA: III  Anesthesia Plan: General   Post-op Pain Management:    Induction: Intravenous  PONV Risk Score and Plan: 2 and Ondansetron and Dexamethasone  Airway Management Planned: LMA  Additional Equipment: None  Intra-op Plan:   Post-operative Plan: Extubation in OR  Informed Consent: I have reviewed the patients History and Physical, chart, labs and discussed the procedure including the risks, benefits and alternatives for the proposed anesthesia with the patient or authorized representative who has indicated his/her understanding and acceptance.   Dental advisory given  Plan Discussed with: Surgeon and CRNA  Anesthesia Plan Comments:         Anesthesia Quick Evaluation

## 2017-11-16 NOTE — Transfer of Care (Signed)
  Last Vitals:  Vitals:   11/16/17 1353 11/16/17 1630  BP: 125/72 (!) 109/48  Pulse: 83 74  Resp: 19 16  Temp: 36.4 C 36.4 C  SpO2: 98% 92%    Last Pain:  Vitals:   11/16/17 1353  TempSrc: Oral      Patients Stated Pain Goal: 6 (11/16/17 1419)  Immediate Anesthesia Transfer of Care Note  Patient: Colton Miranda  Procedure(s) Performed: Procedure(s) (LRB): GOLD SEED IMPLANT (N/A) SPACE OAR INSTILLATION (N/A)  Patient Location: PACU  Anesthesia Type: General  Level of Consciousness: awake, alert  and oriented  Airway & Oxygen Therapy: Patient Spontanous Breathing and Patient connected to nasal cannula oxygen  Post-op Assessment: Report given to PACU RN and Post -op Vital signs reviewed and stable  Post vital signs: Reviewed and stable  Complications: No apparent anesthesia complications

## 2017-11-16 NOTE — H&P (Signed)
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 He denies any LUTS.   Past Medical History:  Diagnosis Date  . BPH associated with nocturia   . Chronic constipation   . COPD (chronic obstructive pulmonary disease) ([place])   . History of adenomatous polyp of colon   . History of basal cell carcinoma (BCC) of skin    post excision from trunk  . 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 20 years ago , 1999, caused by blood clot after knee scope"  DVT to PE  . History of DVT (deep vein thrombosis)    11-06-2017 per pt and wife happened lower leg approx. 1999 after knee scope  . History of pulmonary embolus (PE)    11-06-2017 from DVT in approx. 1999 per wife and pt  . Hyperlipidemia   . Hypertension    followed by pcp  . Long term (current) use of anticoagulants Coumadin-- followed by Pharmasist w/ [facility]   secondary to primary hypercoagulopathy w/ hx DVT and PE  . Myalgia and myositis, unspecified   . OA (osteoarthritis)    "all joints"  . Pinched nerve in neck    per pt causes intermittant numbness bilateral arms and hands  . Primary hypercoagulable state ([place])   . Prostate cancer [facility]) urologist-  dr Alyson Ingles  oncologist-- dr Tammi Klippel   dx 08-28-2017  Stage T1c, Gleason 4+4, PSA 14.1, vol 42.8cc-- planned treatment external radiation and ADT  . Right bundle branch block   . Seborrheic dermatitis, unspecified   . Type 2 diabetes mellitus ([place])   . Urinary frequency   . Wears glasses    Past Surgical History:  Procedure Laterality Date  . CHOLECYSTECTOMY OPEN  1981  . COLONOSCOPY  last one 01-28-2017  . KNEE ARTHROSCOPY Right 1990s  . PILONIDAL CYST EXCISION  1968  . PROSTATE BIOPSY  09/10/2017    (done in office)   + Pos for Prostate Cancer,  Dr.MCkinzie( [facility])   . TOTAL HIP ARTHROPLASTY Right 06-18-2009   dr Wynelle Link  [facility]    Home Medications:  Current Facility-Administered Medications  Medication Dose Route Frequency Provider Last Rate Last Dose  . gentamicin (GARAMYCIN) 410 mg in dextrose 5 % 100 mL IVPB  5 mg/kg (Adjusted) Intravenous On Call to Garber, Candee Furbish, MD      . lactated ringers infusion   Intravenous Continuous Catalina Gravel, MD 50 mL/hr at 11/16/17 1446     Allergies: No Known Allergies  Family History  Problem Relation Age of Onset  . Cancer Mother 68       colon , breast  . Colon cancer Mother   . Heart attack Daughter        2008  . Parkinson's disease Sister   . Cancer Sister        breast  . Cancer Maternal Uncle        melanoma  . Cancer Paternal Uncle    Social History:  reports that he quit smoking about 8 years ago. His smoking use included cigarettes. He quit after 50.00 years of use. he has never used smokeless tobacco. He reports that he drinks about 3.0 oz of alcohol per week. He reports that he does not use drugs.  Review of Systems  All  other systems reviewed and are negative.   Physical Exam:  Vital signs in last 24 hours: Temp:  [97.6 F (36.4 C)] 97.6 F (36.4 C) (02/25 1353) Pulse Rate:  [83] 83 (02/25 1353) Resp:  [19] 19 (02/25 1353) BP: (125)/(72) 125/72 (02/25 1353) SpO2:  [98 %] 98 % (02/25 1353) Weight:  [108.4 kg (239 lb)] 108.4 kg (239 lb) (02/25 1353) Physical Exam  Constitutional: He is oriented to person, place, and time. He appears well-developed and well-nourished.  HENT:  Head: Normocephalic and atraumatic.  Eyes: EOM are normal. Pupils are equal, round, and reactive to light.  Neck: Normal range of motion. No thyromegaly present.  Cardiovascular: Normal rate and regular rhythm.  Respiratory: Effort normal. No respiratory distress.  GI: Soft. He exhibits no distension.  Musculoskeletal: Normal range of motion. He exhibits no edema.   Neurological: He is alert and oriented to person, place, and time.  Skin: Skin is warm and dry.  Psychiatric: He has a normal mood and affect. His behavior is normal. Judgment and thought content normal.    Laboratory Data:  Results for orders placed or performed during the hospital encounter of 11/16/17 (from the past 24 hour(s))  I-STAT, chem 8     Status: Abnormal   Collection Time: 11/16/17  2:38 PM  Result Value Ref Range   Sodium 139 135 - 145 mmol/L   Potassium 4.0 3.5 - 5.1 mmol/L   Chloride 102 101 - 111 mmol/L   BUN 20 6 - 20 mg/dL   Creatinine, Ser 0.90 0.61 - 1.24 mg/dL   Glucose, Bld 115 (H) 65 - 99 mg/dL   Calcium, Ion 1.26 1.15 - 1.40 mmol/L   TCO2 24 22 - 32 mmol/L   Hemoglobin 13.3 13.0 - 17.0 g/dL   HCT 39.0 39.0 - 52.0 %   No results found for this or any previous visit (from the past 240 hour(s)). Creatinine: Recent Labs    11/16/17 1438  CREATININE 0.90   Baseline Creatinine: 0.9  Impression/Assessment:  76yo with high risk prostate cacner  Plan:  The risks/benefits/alternatives to fiducial marker placement with SpaceOAR was explained to the patient and he understands and wishes to proceed with surgery  Nicolette Bang 11/16/2017, 3:46 PM

## 2017-11-16 NOTE — Discharge Instructions (Signed)
Call your surgeon if you experience:   1.  Fever over 101.0. 2.  Inability to urinate. 3.  Nausea and/or vomiting. 4.  Extreme swelling or bruising at the surgical site. 5.  Continued bleeding from the incision. 6.  Increased pain, redness or drainage from the incision. 7.  Problems related to your pain medication. 8.  Any problems and/or concerns Post Anesthesia Home Care Instructions  Activity: Get plenty of rest for the remainder of the day. A responsible individual must stay with you for 24 hours following the procedure.  For the next 24 hours, DO NOT: -Drive a car -Paediatric nurse -Drink alcoholic beverages -Take any medication unless instructed by your physician -Make any legal decisions or sign important papers.  Meals: Start with liquid foods such as gelatin or soup. Progress to regular foods as tolerated. Avoid greasy, spicy, heavy foods. If nausea and/or vomiting occur, drink only clear liquids until the nausea and/or vomiting subsides. Call your physician if vomiting continues.  Special Instructions/Symptoms: Your throat may feel dry or sore from the anesthesia or the breathing tube placed in your throat during surgery. If this causes discomfort, gargle with warm salt water. The discomfort should disappear within 24 hours.   Transrectal Ultrasound-Guided Prostate Gold Seed Placement Transrectal ultrasound-guided prostate gold seed placement is a procedure to place small metal seeds (fiducial markers)in or around a tumor on the prostate. These seeds help show exactly where a tumor is located. This helps guide radiation therapy directly at the prostate tumor, which avoids killing nearby healthy tissue. During this procedure, a small device (probe) is lubricated and placed inside the rectum. The probe makes sound waves that create a picture of the prostate (transrectal ultrasound). The images will be used to help guide the placement of the gold seeds. This procedure is also  called fiducial marker placement. Tell a health care provider about:  Any allergies you have.  All medicines you are taking, including vitamins, herbs, eye drops, creams, and over-the-counter medicines.  Any problems you or family members have had with anesthetic medicines.  Any medical conditions you have.  Any blood disorders you have.  Any surgeries you have had. What are the risks? Generally, this is a safe procedure. However, problems may occur, including:  Infection.  Bleeding.  Allergic reactions to medicines or dyes.  Damage to other structures or organs.  The gold seeds moving to another part of the body. This is rare.  What happens before the procedure? Medicines  Ask your health care provider about: ? Changing or stopping your regular medicines. This is especially important if you are taking diabetes medicines or blood thinners. ? Taking over-the-counter medicines, vitamins, herbs, and supplements. ? Taking medicines such as aspirin and ibuprofen. These medicines can thin your blood. Do not take these medicines unless your health care provider tells you to take them.  You may be given antibiotic medicine to help prevent an infection. Staying hydrated Follow instructions from your health care provider about hydration, which may include:  Up to 2 hours before the procedure - you may continue to drink clear liquids, such as water, clear fruit juice, black coffee, and plain tea.  Eating and drinking restrictions Follow instructions from your health care provider about eating and drinking, which may include:  8 hours before the procedure - stop eating heavy meals or foods such as meat, fried foods, or fatty foods.  6 hours before the procedure - stop eating light meals or foods, such as toast or  cereal.  6 hours before the procedure - stop drinking milk or drinks that contain milk.  2 hours before the procedure - stop drinking clear liquids.  General  instructions  If you were asked to do a bowel prep before your procedure, follow instructions from your health care provider about how to do this.  You may have a blood sample taken.  Plan to have someone take you home from the hospital or clinic.  Plan to have a responsible adult care for you for at least 24 hours after you leave the hospital or clinic. This is important. What happens during the procedure?  To lower your risk of infection: ? Your health care team will wash or sanitize their hands. ? Hair may be removed from the surgical area. ? Your skin will be washed with soap.  Monitors may be placed on your body to track your heart rate, blood pressure, and breathing.  An IV will be inserted into one of your veins.  You will be given one or more of the following: ? A medicine to help you relax (sedative). ? A medicine to numb the area (local anesthetic). ? A medicine that is injected into an area of your body to numb everything below the injection site (regional anesthetic).  A lubricated probe will be inserted into your rectum to perform the ultrasound.  You will be placed on your left side with your knees bent up toward your chest.  Using the ultrasound as a guide, the health care provider will insert a needle between your rectum and scrotum and will place it near the tumor.  The needle will inject the gold seed into the area around the tumor. This will be repeated with two more gold seeds. More seeds may be added depending on the size and location of the tumor.  The needle and probe will be removed. The procedure may vary among health care providers and hospitals. What happens after the procedure?  You may have imaging tests done to check the placement of the gold seeds. This may include a CT scan or ultrasound.  Your blood pressure, heart rate, breathing rate, and blood oxygen level will continue to be monitored until the medicines you were given have worn off.  Do  not drive for 24 hours if you were given a sedative.  You will be given medicine to help with pain, if needed. Summary  This procedure involves placing small metal seeds (fiducial markers) in or around a prostate tumor.  The gold seeds help guide radiation therapy directly at the prostate tumor, which avoids killing nearby healthy tissue.  During this procedure, a small probe will be placed in the rectum to take images of the prostate (transrectal ultrasound). The images will help guide the placement of the gold seeds. This information is not intended to replace advice given to you by your health care provider. Make sure you discuss any questions you have with your health care provider. Document Released: 12/23/2016 Document Revised: 12/23/2016 Document Reviewed: 12/23/2016 Elsevier Interactive Patient Education  [employer].

## 2017-11-16 NOTE — Anesthesia Procedure Notes (Signed)
Procedure Name: LMA Insertion Date/Time: 11/16/2017 4:10 PM Performed by: Lynda Rainwater, MD Pre-anesthesia Checklist: Patient identified, Emergency Drugs available, Suction available and Patient being monitored Patient Re-evaluated:Patient Re-evaluated prior to induction Oxygen Delivery Method: Circle system utilized Preoxygenation: Pre-oxygenation with 100% oxygen Induction Type: IV induction Ventilation: Mask ventilation without difficulty LMA: LMA inserted LMA Size: 5.0 Number of attempts: 1 Airway Equipment and Method: Bite block Placement Confirmation: positive ETCO2 Tube secured with: Tape Dental Injury: Teeth and Oropharynx as per pre-operative assessment

## 2017-11-16 NOTE — Op Note (Signed)
PRE-OPERATIVE DIAGNOSIS:  Adenocarcinoma of the prostate  POST-OPERATIVE DIAGNOSIS:  Same  PROCEDURE:  1. Placement of fiducial markers 2. Placement of SpaceOAR  SURGEON:  Surgeon(s): Nicolette Bang, MD  ANESTHESIA:  General  EBL:  Minimal  DRAINS: none  INDICATION: Colton Miranda is a 77 year old with a history of T1c prostate cancer who is scheduled to undergo IMRT. He wishes to have [place] placed prior to IMRT to decrease rectal toxicity. He also wishes to proceed with fiducial marker placement.  Description of procedure: After informed consent the patient was brought to the major OR, placed on the table and administered general anesthesia. He was then moved to the modified lithotomy position with his perineum perpendicular to the floor. His perineum and genitalia were then sterilely prepped. An official timeout was then performed.  Real time ultrasonography was used to place 3 fiducial markers. The markers were placed at the right mid apex, right mid base and left mid lateral.  We then proceeded to mix the SpaceOAR using the kit supplied from the manufacturer. Once this was complete we placed a sinal needle into the perirectal fat between the rectum and the prostate. Once this was accomplished we injected 2cc of normal saline to hydrodissect the plain. We then instilled the the SpaceOAR through the spinal needle and noted good distribution in the perirectal fat.  The patient was awakened and taken to recovery room in stable and satisfactory condition. He tolerated procedure well and there were no intraoperative complications.

## 2017-11-16 NOTE — Anesthesia Postprocedure Evaluation (Signed)
Anesthesia Post Note  Patient: Colton Miranda  Procedure(s) Performed: GOLD SEED IMPLANT (N/A Prostate) SPACE OAR INSTILLATION (N/A Rectum)     Patient location during evaluation: PACU Anesthesia Type: General Level of consciousness: awake and alert Pain management: pain level controlled Vital Signs Assessment: post-procedure vital signs reviewed and stable Respiratory status: spontaneous breathing, nonlabored ventilation and respiratory function stable Cardiovascular status: blood pressure returned to baseline and stable Postop Assessment: no apparent nausea or vomiting Anesthetic complications: no    Last Vitals:  Vitals:   11/16/17 1645 11/16/17 1700  BP: 127/74 (!) 167/68  Pulse: 66 69  Resp: 19 16  Temp:    SpO2: 98% 100%    Last Pain:  Vitals:   11/16/17 1353  TempSrc: Oral                 Lynda Rainwater

## 2017-11-17 ENCOUNTER — Encounter (HOSPITAL_BASED_OUTPATIENT_CLINIC_OR_DEPARTMENT_OTHER): Payer: Self-pay | Admitting: Urology

## 2017-11-23 ENCOUNTER — Ambulatory Visit (INDEPENDENT_AMBULATORY_CARE_PROVIDER_SITE_OTHER): Payer: PPO | Admitting: Pharmacotherapy

## 2017-11-23 ENCOUNTER — Encounter: Payer: Self-pay | Admitting: Pharmacotherapy

## 2017-11-23 DIAGNOSIS — Z7901 Long term (current) use of anticoagulants: Secondary | ICD-10-CM | POA: Diagnosis not present

## 2017-11-23 DIAGNOSIS — D6859 Other primary thrombophilia: Secondary | ICD-10-CM

## 2017-11-23 LAB — POCT INR: INR: 1.3

## 2017-11-23 NOTE — Patient Instructions (Signed)
INR 1.3  Take Coumadin 17.5mg  x 3 days, then start 15mg  daily except 17.5mg  on Mondays, Wednesdays, and Fridays

## 2017-11-23 NOTE — Progress Notes (Signed)
   Subjective:    Patient ID: Colton Miranda, male    DOB: [birth year], 77 y.o.   MRN: [id number]  HPI  Last INR on 10/26/17 was OK at 2.0 Coumadin was held for 5 days due to prostate procedure. He did take enoxaparin last night. No unusual bleeding or bruising.  To see urologist tomorrow. No CP, SOB, falls Did have increased vitamin K yesterday.  Review of Systems  HENT: Negative for nosebleeds.   Respiratory: Negative for shortness of breath.   Cardiovascular: Negative for chest pain.  Gastrointestinal: Negative for anal bleeding and blood in stool.  Genitourinary: Negative for hematuria.  Hematological: Does not bruise/bleed easily.       Objective:   Physical Exam  Constitutional: He is oriented to person, place, and time. He appears well-developed and well-nourished.  HENT:  Right Ear: External ear normal.  Left Ear: External ear normal.  Pulmonary/Chest: Effort normal.  Neurological: He is alert and oriented to person, place, and time.  Skin: Skin is warm and dry.  Psychiatric: He has a normal mood and affect. His behavior is normal. Judgment and thought content normal.  Vitals reviewed.   BP: 100/60  HR: 60  Wt: 239lb INR 1.3      Assessment & Plan:  1.  INR below goal 2-3 due to being off warfarin and increased vitamin K intake 2.  Take Coumadin 17.5mg  x 3 days, then start 15mg  daily except 17.5mg  MWF 3.  RTC 2 weeks.

## 2017-11-24 ENCOUNTER — Telehealth: Payer: Self-pay

## 2017-11-24 DIAGNOSIS — C61 Malignant neoplasm of prostate: Secondary | ICD-10-CM | POA: Diagnosis not present

## 2017-11-24 DIAGNOSIS — N3000 Acute cystitis without hematuria: Secondary | ICD-10-CM | POA: Diagnosis not present

## 2017-11-24 NOTE — Telephone Encounter (Signed)
Patient's wife called to say that patient's blood pressure was 89/55 while at urologist today. Blood pressure was checked in both arms with automated cuff and then manually.   Patient's blood pressure was 100/60 yesterday while at office with Tivis Ringer.    Please advise on any medication adjustments

## 2017-11-24 NOTE — Telephone Encounter (Signed)
I spoke with patient's wife and she verbalized understanding. Patient is scheduled for a routine follow up on 12/02/17 and they would like BP check to be done then.

## 2017-11-24 NOTE — Telephone Encounter (Signed)
Lets have him hold lisinopril tomorrow (assuming he has taken today) and restart on 3/7 and take lisinopril 10 mg daily Have him follow up in office early next week  If he can check blood pressure at home and bring readings to visit this would be helpful as well

## 2017-11-26 ENCOUNTER — Telehealth: Payer: Self-pay

## 2017-11-26 NOTE — Telephone Encounter (Signed)
Patient is taking 1/2 pill as last instructed.  Spoke with patient's wife and she verbalized understanding of Colton Miranda's response. [name] will call tomorrow as a FYI with B/P reading  Medication list updated to reflect current dose

## 2017-11-26 NOTE — Telephone Encounter (Signed)
Patient's wife called (patient in the background), patient was told to take 1/2 of blood pressure medication on 11/24/17 due to B/P dropping low. Patient's B/P readings 186/108 (while off medication), 170/110 (while off medication) and 150/90 after restarting medication.   Patient is also c/o increased ringing in ears x 2 days, otherwise patient denies other symptoms.  Patient's wife states when she was taking B/P it appeared to beat then pause, patient had her neighbor who is a nurse aid check and she heard the same thing   Patient has pending appointment on 12/02/17  Please advise

## 2017-11-26 NOTE — Telephone Encounter (Signed)
Thank you :)

## 2017-11-26 NOTE — Telephone Encounter (Signed)
What dose of medication is he taking? Is he still taking 1/2 tablet or has he resumed a whole tablet? Lets have him check blood pressure again tomorrow. If blood pressure remaining over 140/90 can resume previous dosing and monitor.

## 2017-11-27 NOTE — Telephone Encounter (Signed)
Patient's wife called to say that patient's BP was 150/90 this morning. Previous instructions were given to patient and wife again this morning and both verbalized understanding. They would like to know if there are any additional instructions.   Please advise

## 2017-11-27 NOTE — Telephone Encounter (Signed)
No just to resume previous dose of medication, monitor over the weekend and to notify us if needed before appt next week

## 2017-11-30 NOTE — Progress Notes (Signed)
  Radiation Oncology         [phone]) [phone] ________________________________  Name: Colton Miranda MRN: [id number]  Date: 12/02/2017  DOB: 1941-07-05  SIMULATION AND TREATMENT PLANNING NOTE    ICD-10-CM   1. Malignant neoplasm of prostate ([place]) C61     DIAGNOSIS:  77 y.o. gentleman with Stage T1c adenocarcinoma of the prostate with Gleason Score of 4+4, and PSA of 14.1  NARRATIVE:  The patient was brought to the [facility].  Identity was confirmed.  All relevant records and images related to the planned course of therapy were reviewed.  The patient freely provided informed written consent to proceed with treatment after reviewing the details related to the planned course of therapy. The consent form was witnessed and verified by the simulation staff.  Then, the patient was set-up in a stable reproducible supine position for radiation therapy.  A vacuum lock pillow device was custom fabricated to position his legs in a reproducible immobilized position.  Then, I performed a urethrogram under sterile conditions to identify the prostatic apex.  CT images were obtained.  Surface markings were placed.  The CT images were loaded into the planning software.  Then the prostate target and avoidance structures including the rectum, bladder, bowel and hips were contoured.  Treatment planning then occurred.  The radiation prescription was entered and confirmed.  A total of one complex treatment devices were fabricated. I have requested : Intensity Modulated Radiotherapy (IMRT) is medically necessary for this case for the following reason:  Rectal sparing.Marland Kitchen  PLAN:  The patient will receive 45 Gy in 25 fractions of 1.8 Gy, followed by a boost to the prostate to a total dose of 75 Gy with 15 additional fractions of 2.0 Gy.  ________________________________  Sheral Apley Tammi Klippel, M.D.

## 2017-12-01 ENCOUNTER — Telehealth: Payer: Self-pay | Admitting: *Deleted

## 2017-12-01 NOTE — Telephone Encounter (Signed)
CALLED PATIENT TO REMIND OF APPTS. FOR 12-02-17, SPOKE WITH PATIENT'S WIFE- MELANIE AND SHE IS AWARE OF THESE APPTS.

## 2017-12-02 ENCOUNTER — Ambulatory Visit (INDEPENDENT_AMBULATORY_CARE_PROVIDER_SITE_OTHER): Payer: PPO

## 2017-12-02 ENCOUNTER — Ambulatory Visit (INDEPENDENT_AMBULATORY_CARE_PROVIDER_SITE_OTHER): Payer: PPO | Admitting: Nurse Practitioner

## 2017-12-02 ENCOUNTER — Ambulatory Visit
Admission: RE | Admit: 2017-12-02 | Discharge: 2017-12-02 | Disposition: A | Discharge status: Home / Self Care | Payer: PPO | Source: Ambulatory Visit | Attending: Radiation Oncology | Admitting: Radiation Oncology

## 2017-12-02 ENCOUNTER — Encounter: Payer: Self-pay | Admitting: Nurse Practitioner

## 2017-12-02 ENCOUNTER — Ambulatory Visit (HOSPITAL_COMMUNITY)
Admission: RE | Admit: 2017-12-02 | Discharge: 2017-12-02 | Disposition: A | Discharge status: Home / Self Care | Payer: PPO | Source: Ambulatory Visit | Attending: Urology | Admitting: Urology

## 2017-12-02 VITALS — BP 132/70 | HR 73 | Temp 97.6°F | Ht 65.0 in | Wt 240.0 lb

## 2017-12-02 DIAGNOSIS — C61 Malignant neoplasm of prostate: Secondary | ICD-10-CM

## 2017-12-02 DIAGNOSIS — E785 Hyperlipidemia, unspecified: Secondary | ICD-10-CM

## 2017-12-02 DIAGNOSIS — Z0000 Encounter for general adult medical examination without abnormal findings: Secondary | ICD-10-CM

## 2017-12-02 DIAGNOSIS — E119 Type 2 diabetes mellitus without complications: Secondary | ICD-10-CM

## 2017-12-02 DIAGNOSIS — R350 Frequency of micturition: Secondary | ICD-10-CM

## 2017-12-02 DIAGNOSIS — G4700 Insomnia, unspecified: Secondary | ICD-10-CM

## 2017-12-02 DIAGNOSIS — I10 Essential (primary) hypertension: Secondary | ICD-10-CM | POA: Diagnosis not present

## 2017-12-02 DIAGNOSIS — D6859 Other primary thrombophilia: Secondary | ICD-10-CM | POA: Diagnosis not present

## 2017-12-02 MED ORDER — ZOSTER VAC RECOMB ADJUVANTED 50 MCG/0.5ML IM SUSR: 0.5000 mL | Freq: Once | INTRAMUSCULAR | 1 refills | Status: AC

## 2017-12-02 NOTE — Patient Instructions (Signed)
Mr. Colton Miranda , Thank you for taking time to come for your Medicare Wellness Visit. I appreciate your ongoing commitment to your health goals. Please review the following plan we discussed and let me know if I can assist you in the future.   Screening recommendations/referrals: Colonoscopy excluded, you are over age 77 Recommended yearly ophthalmology/optometry visit for glaucoma screening and checkup Recommended yearly dental visit for hygiene and checkup  Vaccinations: Influenza vaccine excluded Pneumococcal vaccine up to date, completed Tdap vaccine due, declined Shingles vaccine due, prescription sent to pharmacy    Advanced directives: Please bring Colton Miranda a copy of your living will and health care power of attorney  Conditions/risks identified: none  Next appointment: Colton Dense, RN 12/06/18 @ 3:15pm  Preventive Care 73 Years and Older, Male Preventive care refers to lifestyle choices and visits with your health care provider that can promote health and wellness. What does preventive care include?  A yearly physical exam. This is also called an annual well check.  Dental exams once or twice a year.  Routine eye exams. Ask your health care provider how often you should have your eyes checked.  Personal lifestyle choices, including:  Daily care of your teeth and gums.  Regular physical activity.  Eating a healthy diet.  Avoiding tobacco and drug use.  Limiting alcohol use.  Practicing safe sex.  Taking low doses of aspirin every day.  Taking vitamin and mineral supplements as recommended by your health care provider. What happens during an annual well check? The services and screenings done by your health care provider during your annual well check will depend on your age, overall health, lifestyle risk factors, and family history of disease. Counseling  Your health care provider may ask you questions about your:  Alcohol use.  Tobacco use.  Drug  use.  Emotional well-being.  Home and relationship well-being.  Sexual activity.  Eating habits.  History of falls.  Memory and ability to understand (cognition).  Work and work Statistician. Screening  You may have the following tests or measurements:  Height, weight, and BMI.  Blood pressure.  Lipid and cholesterol levels. These may be checked every 5 years, or more frequently if you are over 52 years old.  Skin check.  Lung cancer screening. You may have this screening every year starting at age 49 if you have a 30-pack-year history of smoking and currently smoke or have quit within the past 15 years.  Fecal occult blood test (FOBT) of the stool. You may have this test every year starting at age 24.  Flexible sigmoidoscopy or colonoscopy. You may have a sigmoidoscopy every 5 years or a colonoscopy every 10 years starting at age 69.  Prostate cancer screening. Recommendations will vary depending on your family history and other risks.  Hepatitis C blood test.  Hepatitis B blood test.  Sexually transmitted disease (STD) testing.  Diabetes screening. This is done by checking your blood sugar (glucose) after you have not eaten for a while (fasting). You may have this done every 1-3 years.  Abdominal aortic aneurysm (AAA) screening. You may need this if you are a current or former smoker.  Osteoporosis. You may be screened starting at age 78 if you are at high risk. Talk with your health care provider about your test results, treatment options, and if necessary, the need for more tests. Vaccines  Your health care provider may recommend certain vaccines, such as:  Influenza vaccine. This is recommended every year.  Tetanus, diphtheria, and  acellular pertussis (Tdap, Td) vaccine. You may need a Td booster every 10 years.  Zoster vaccine. You may need this after age 6.  Pneumococcal 13-valent conjugate (PCV13) vaccine. One dose is recommended after age  26.  Pneumococcal polysaccharide (PPSV23) vaccine. One dose is recommended after age 52. Talk to your health care provider about which screenings and vaccines you need and how often you need them. This information is not intended to replace advice given to you by your health care provider. Make sure you discuss any questions you have with your health care provider. Document Released: 10/05/2015 Document Revised: 05/28/2016 Document Reviewed: 07/10/2015 Elsevier Interactive Patient Education  2017 [place] Prevention in the Home Falls can cause injuries. They can happen to people of all ages. There are many things you can do to make your home safe and to help prevent falls. What can I do on the outside of my home?  Regularly fix the edges of walkways and driveways and fix any cracks.  Remove anything that might make you trip as you walk through a door, such as a raised step or threshold.  Trim any bushes or trees on the path to your home.  Use bright outdoor lighting.  Clear any walking paths of anything that might make someone trip, such as rocks or tools.  Regularly check to see if handrails are loose or broken. Make sure that both sides of any steps have handrails.  Any raised decks and porches should have guardrails on the edges.  Have any leaves, snow, or ice cleared regularly.  Use sand or salt on walking paths during winter.  Clean up any spills in your garage right away. This includes oil or grease spills. What can I do in the bathroom?  Use night lights.  Install grab bars by the toilet and in the tub and shower. Do not use towel bars as grab bars.  Use non-skid mats or decals in the tub or shower.  If you need to sit down in the shower, use a plastic, non-slip stool.  Keep the floor dry. Clean up any water that spills on the floor as soon as it happens.  Remove soap buildup in the tub or shower regularly.  Attach bath mats securely with double-sided  non-slip rug tape.  Do not have throw rugs and other things on the floor that can make you trip. What can I do in the bedroom?  Use night lights.  Make sure that you have a light by your bed that is easy to reach.  Do not use any sheets or blankets that are too big for your bed. They should not hang down onto the floor.  Have a firm chair that has side arms. You can use this for support while you get dressed.  Do not have throw rugs and other things on the floor that can make you trip. What can I do in the kitchen?  Clean up any spills right away.  Avoid walking on wet floors.  Keep items that you use a lot in easy-to-reach places.  If you need to reach something above you, use a strong step stool that has a grab bar.  Keep electrical cords out of the way.  Do not use floor polish or wax that makes floors slippery. If you must use wax, use non-skid floor wax.  Do not have throw rugs and other things on the floor that can make you trip. What can I do with my stairs?  Do not leave any items on the stairs.  Make sure that there are handrails on both sides of the stairs and use them. Fix handrails that are broken or loose. Make sure that handrails are as long as the stairways.  Check any carpeting to make sure that it is firmly attached to the stairs. Fix any carpet that is loose or worn.  Avoid having throw rugs at the top or bottom of the stairs. If you do have throw rugs, attach them to the floor with carpet tape.  Make sure that you have a light switch at the top of the stairs and the bottom of the stairs. If you do not have them, ask someone to add them for you. What else can I do to help prevent falls?  Wear shoes that:  Do not have high heels.  Have rubber bottoms.  Are comfortable and fit you well.  Are closed at the toe. Do not wear sandals.  If you use a stepladder:  Make sure that it is fully opened. Do not climb a closed stepladder.  Make sure that both  sides of the stepladder are locked into place.  Ask someone to hold it for you, if possible.  Clearly mark and make sure that you can see:  Any grab bars or handrails.  First and last steps.  Where the edge of each step is.  Use tools that help you move around (mobility aids) if they are needed. These include:  Canes.  Walkers.  Scooters.  Crutches.  Turn on the lights when you go into a dark area. Replace any light bulbs as soon as they burn out.  Set up your furniture so you have a clear path. Avoid moving your furniture around.  If any of your floors are uneven, fix them.  If there are any pets around you, be aware of where they are.  Review your medicines with your doctor. Some medicines can make you feel dizzy. This can increase your chance of falling. Ask your doctor what other things that you can do to help prevent falls. This information is not intended to replace advice given to you by your health care provider. Make sure you discuss any questions you have with your health care provider. Document Released: 07/05/2009 Document Revised: 02/14/2016 Document Reviewed: 10/13/2014 Elsevier Interactive Patient Education  2017 [employer].

## 2017-12-02 NOTE — Progress Notes (Signed)
Subjective:   Colton Miranda is a 77 y.o. male who presents for Medicare Annual/Subsequent preventive examination.  Last AWV-11/26/2016       Objective:    Vitals: BP 132/70 (BP Location: Left Arm, Patient Position: Sitting)   Pulse 73   Temp 97.6 F (36.4 C) (Oral)   Ht 5' 5"  (1.651 m)   Wt 240 lb (108.9 kg)   SpO2 96%   BMI 39.94 kg/m   Body mass index is 39.94 kg/m.  Advanced Directives 12/02/2017 11/16/2017 06/04/2017 06/01/2017 04/03/2017 03/27/2017 11/26/2016  Does Patient Have a Medical Advance Directive? Yes Yes No No No No Yes  Type of Paramedic of [place];Living will [place];Living will - - - - Press photographer  Does patient want to make changes to medical advance directive? No - Patient declined - - - - - -  Copy of [place] in Chart? No - copy requested No - copy requested - - - - -    Tobacco Social History   Tobacco Use  Smoking Status Former Smoker  . Years: 50.00  . Types: Cigarettes  . Last attempt to quit: 01/03/2009  . Years since quitting: 8.9  Smokeless Tobacco Never Used     Counseling given: Not Answered   Clinical Intake:  Pre-visit preparation completed: No  Pain : 0-10 Pain Score: 4  Pain Type: Chronic pain Pain Location: Knee Pain Orientation: Left Pain Descriptors / Indicators: Aching Pain Onset: More than a month ago Pain Frequency: Constant     Nutritional Risks: None Diabetes: Yes CBG done?: No Did pt. bring in CBG monitor from home?: No  How often do you need to have someone help you when you read instructions, pamphlets, or other written materials from your doctor or pharmacy?: 1 - Never What is the last grade level you completed in school?: 2 years college  Interpreter Needed?: No  Information entered by :: [name], RN  Past Medical History:  Diagnosis Date  . BPH associated with nocturia   . Chronic constipation   . COPD (chronic  obstructive pulmonary disease) ([place])   . History of adenomatous polyp of colon   . History of basal cell carcinoma (BCC) of skin    post excision from trunk  . 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 20 years ago , 1999, caused by blood clot after knee scope"  DVT to PE  . History of DVT (deep vein thrombosis)    11-06-2017 per pt and wife happened lower leg approx. 1999 after knee scope  . History of pulmonary embolus (PE)    11-06-2017 from DVT in approx. 1999 per wife and pt  . Hyperlipidemia   . Hypertension    followed by pcp  . Long term (current) use of anticoagulants Coumadin-- [facility] by Pharmasist w/ [facility]   secondary to primary hypercoagulopathy w/ hx DVT and PE  . Myalgia and myositis, unspecified   . OA (osteoarthritis)    "all joints"  . Pinched nerve in neck    per pt causes intermittant numbness bilateral arms and hands  . Primary hypercoagulable state ([place])   . Prostate cancer [facility]) urologist-  dr Alyson Ingles  oncologist-- dr Tammi Klippel   dx 08-28-2017  Stage T1c, Gleason 4+4, PSA 14.1, vol 42.8cc-- planned treatment external radiation and ADT  . Right bundle branch block   . Seborrheic  dermatitis, unspecified   . Type 2 diabetes mellitus ([place])   . Urinary frequency   . Wears glasses    Past Surgical History:  Procedure Laterality Date  . CHOLECYSTECTOMY OPEN  1981  . COLONOSCOPY  last one 01-28-2017  . GOLD SEED IMPLANT N/A 11/16/2017   Procedure: GOLD SEED IMPLANT;  Surgeon: Cleon Gustin, MD;  Location[facility];  Service: Urology;  Laterality: N/A;  . KNEE ARTHROSCOPY Right 1990s  . PILONIDAL CYST EXCISION  1968  . PROSTATE BIOPSY  09/10/2017    (done in office)   + Pos for Prostate Cancer, Dr.MCkinzie( [facility])   . SPACE OAR INSTILLATION N/A 11/16/2017   Procedure: SPACE OAR INSTILLATION;  Surgeon: Cleon Gustin, MD;  Location:  [facility];  Service: Urology;  Laterality: N/A;  . TOTAL HIP ARTHROPLASTY Right 06-18-2009   dr Wynelle Link  [facility]   Family History  Problem Relation Age of Onset  . Cancer Mother 58       colon , breast  . Colon cancer Mother   . Heart attack Daughter        2008  . Parkinson's disease Sister   . Cancer Sister        breast  . Cancer Maternal Uncle        melanoma  . Cancer Paternal Uncle    Social History   Socioeconomic History  . Marital status: Married    Spouse name: None  . Number of children: None  . Years of education: None  . Highest education level: None  Social Needs  . Financial resource strain: Not very hard  . Food insecurity - worry: Never true  . Food insecurity - inability: Never true  . Transportation needs - medical: No  . Transportation needs - non-medical: No  Occupational History  . None  Tobacco Use  . Smoking status: Former Smoker    Years: 50.00    Types: Cigarettes    Last attempt to quit: 01/03/2009    Years since quitting: 8.9  . Smokeless tobacco: Never Used  Substance and Sexual Activity  . Alcohol use: Yes    Alcohol/week: 1.8 oz    Types: 3 Cans of beer per week  . Drug use: No  . Sexual activity: Not Currently    Partners: Female  Other Topics Concern  . None  Social History Narrative  . None    Outpatient Encounter Medications as of 12/02/2017  Medication Sig  . Bioflavonoid Products (BIOFLEX) TABS Take 1 tablet by mouth every morning.  . Blood Glucose Monitoring Suppl (ONETOUCH VERIO) w/Device KIT by Does not apply route. Check blood sugar twice daily as directed DX E11.65  . Cholecalciferol (VITAMIN D) 2000 UNITS CAPS Take 1 capsule by mouth daily.    Marland Kitchen docusate sodium (COLACE) 100 MG capsule Take 100 mg by mouth at bedtime.  Marland Kitchen glucose blood (ONETOUCH VERIO) test strip Check blood sugar twice daily as directed E11.65  . lisinopril (PRINIVIL,ZESTRIL) 20 MG tablet Take 10 mg by mouth daily.  . Melatonin 5 MG  CAPS Take 1 capsule (5 mg total) by mouth at bedtime.  . metFORMIN (GLUCOPHAGE) 500 MG tablet Take 1 tablet (500 mg total) by mouth daily with breakfast.  . nystatin (MYCOSTATIN/NYSTOP) powder Apply 100,000 g topically daily as needed. Apply to groin area for yeast/fungal infection  . ONETOUCH DELICA LANCETS 76P MISC 1 each by Does not apply route 3 (three) times daily. Check blood sugar twice  daily E11.65  . simvastatin (ZOCOR) 20 MG tablet TAKE 1 TABLET BY MOUTH ONCE DAILY FOR CHOLESTEROL  . traMADol (ULTRAM) 50 MG tablet Take 1 tablet (50 mg total) by mouth every 6 (six) hours as needed.  . triamcinolone lotion (KENALOG) 0.1 % as needed. Use once daily as needed for skin irritation behind left ear   . warfarin (COUMADIN) 10 MG tablet TAKE 1 TABLET BY MOUTH ONCE DAILY ALONG  WITH  A  5  MG  TABLET  FOR  A  TOTAL  OF  15  MG  DAILY  . warfarin (COUMADIN) 5 MG tablet Take 5 mg along with 10 mg for a total of 42m daily except 7.589mwith 1047mn Mondays, Wednesday and Fridays for a total of 17.5mg60m Zoster Vaccine Adjuvanted ([facility]IPam [facility]tonjection Inject 0.5 mLs into the muscle once for 1 dose.  . [DISCONTINUED] Zoster Vaccine Adjuvanted ([facility]aredojection Inject 0.5 mLs into the muscle once.  . [DISCONTINUED] Misc Natural Products (OSTEO BI-FLEX ADV TRIPLE ST PO) Take by mouth daily.   No facility-administered encounter medications on file as of 12/02/2017.     Activities of Daily Living In your present state of health, do you have any difficulty performing the following activities: 12/02/2017 11/16/2017  Hearing? Y Y  Comment - pitch of voice  Vision? N N  Difficulty concentrating or making decisions? N N  Walking or climbing stairs? Y Y  Dressing or bathing? N N  Doing errands, shopping? N -  Preparing Food and eating ? N -  Using the Toilet? N -  In the past six months, have you accidently leaked urine? N -  Do you have problems with loss of bowel control? N -  Managing your  Medications? N -  Managing your Finances? N -  Housekeeping or managing your Housekeeping? N -  Some recent data might be hidden    Patient Care Team: EubaLauree Chandler as PCP - General (Geriatric Medicine) AluiGaynelle Arabian as Consulting Physician (Orthopedic Surgery) GanjAdrian Prows as Consulting Physician (Cardiology)   Assessment:   This is a routine wellness examination for RobeLenixxercise Activities and Dietary recommendations Current Exercise Habits: The patient does not participate in regular exercise at present, Exercise limited by: None identified  Goals    . Exercise 3x per week (30 min per time)     Patient will start going for walks.    . Weight (lb) < 200 lb (90.7 kg)     Starting 11/26/16, I will attempt to decrease my current weight of 232 lb, to reach my goal weight of 200 lb.        Fall Risk Fall Risk  12/02/2017 10/07/2017 07/27/2017 07/13/2017 06/04/2017  Falls in the past year? Yes No No No No  Comment - - - - -  Number falls in past yr: 1 - - - -  Injury with Fall? Yes - - - -  Comment rib - - - -  Risk Factor Category  - - - - -  Risk for fall due to : - History of fall(s) - - -  Follow up - - - - -   Is the patient's home free of loose throw rugs in walkways, pet beds, electrical cords, etc?   yes      Grab bars in the bathroom? no      Handrails on the stairs?   yes      Adequate lighting?  yes  Timed Get Up and Go Performed: 18 seconds, fall risk  Depression Screen PHQ 2/9 Scores 12/02/2017 10/07/2017 11/26/2016 05/28/2016  PHQ - 2 Score 0 0 0 0    Cognitive Function MMSE - Mini Mental State Exam 12/02/2017 11/26/2016  Orientation to time 5 5  Orientation to Place 5 5  Registration 3 3  Attention/ Calculation 5 5  Recall 2 2  Language- name 2 objects 2 2  Language- repeat 1 1  Language- follow 3 step command 3 2  Language- read & follow direction 1 1  Write a sentence 1 1  Copy design 1 1  Total score 29 28        Immunization  History  Administered Date(s) Administered  . Pneumococcal Conjugate-13 11/08/2014  . Pneumococcal Polysaccharide-23 11/26/2016  . Pneumococcal-Unspecified 09/22/2005    Qualifies for Shingles Vaccine? Yes, educated and prescription sent to pharmacy  Screening Tests Health Maintenance  Topic Date Due  . OPHTHALMOLOGY EXAM  11/21/2018 (Originally 03/26/1951)  . TETANUS/TDAP  12/03/2018 (Originally 03/25/1960)  . HEMOGLOBIN A1C  12/09/2017  . FOOT EXAM  06/04/2018  . COLONOSCOPY  01/28/2022  . PNA vac Low Risk Adult  Completed  . INFLUENZA VACCINE  Discontinued   Cancer Screenings: Lung: Low Dose CT Chest recommended if Age 74-80 years, 30 pack-year currently smoking OR have quit w/in 15years. Patient does qualify. Colorectal: up to date  Additional Screenings:  Hepatitis C Screening: declined Pt will make next eye appointment  Plan:    I have personally reviewed and addressed the Medicare Annual Wellness questionnaire and have noted the following in the patient's chart:  A. Medical and social history B. Use of alcohol, tobacco or illicit drugs  C. Current medications and supplements D. Functional ability and status E.  Nutritional status F.  Physical activity G. Advance directives H. List of other physicians I.  Hospitalizations, surgeries, and ER visits in previous 12 months J.  [place] to include hearing, vision, cognitive, depression L. Referrals and appointments - none  In addition, I have reviewed and discussed with patient certain preventive protocols, quality metrics, and best practice recommendations. A written personalized care plan for preventive services as well as general preventive health recommendations were provided to patient.  See attached scanned questionnaire for additional information.   Signed,   [name], RN Nurse Health Advisor  Patient Concerns: BP varies a lot

## 2017-12-02 NOTE — Progress Notes (Signed)
Careteam: Patient Care Team: Lauree Chandler, NP as PCP - General (Geriatric Medicine) [name], MD as Consulting Physician (Orthopedic Surgery) [name], MD as Consulting Physician (Cardiology)  Advanced Directive information    No Known Allergies  Chief Complaint  Patient presents with  . Medical Management of Chronic Issues    Pt is being seen for a 6 month routine visit.     HPI: Patient is a 77 y.o. male seen in the office today for routine follow up Following with [facility] due to hypercoagulable state, due to DVT/PE   Diabetes- A1c 6.3 in September 2018, taking metformin 500 mg daily    HTN- blood pressure was low for 2 days then high after that sbp 150-160s/60-90   Hyperlipidemia- LDL at goal in September 2018   PSA was elevated in September noted to have prostate cancer and radioactive seeds were implanted on 11/16/17 also getting hormone shots.    Sleeping well on melatonin   Constipation is still an issue- taking a laxative stool softener OTC medication.       Review of Systems:  Review of Systems  Constitutional: Negative for chills, fever and weight loss.  Respiratory: Negative for hemoptysis and shortness of breath.   Cardiovascular: Negative for chest pain and leg swelling.  Gastrointestinal: Negative for blood in stool, constipation, diarrhea and heartburn.  Genitourinary: Positive for frequency. Negative for dysuria, hematuria and urgency.  Musculoskeletal: Positive for joint pain.  Neurological: Negative for dizziness, sensory change and loss of consciousness.  Endo/Heme/Allergies: Bruises/bleeds easily.  Psychiatric/Behavioral: The patient does not have insomnia.     Past Medical History:  Diagnosis Date  . BPH associated with nocturia   . Chronic constipation   . COPD (chronic obstructive pulmonary disease) ([place])   . History of adenomatous polyp of colon   . History of basal cell carcinoma (BCC) of skin    post excision from  trunk  . 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 20 years ago , 1999, caused by blood clot after knee scope"  DVT to PE  . History of DVT (deep vein thrombosis)    11-06-2017 per pt and wife happened lower leg approx. 1999 after knee scope  . History of pulmonary embolus (PE)    11-06-2017 from DVT in approx. 1999 per wife and pt  . Hyperlipidemia   . Hypertension    followed by pcp  . Long term (current) use of anticoagulants Coumadin-- followed by Pharmasist w/ [facility]   secondary to primary hypercoagulopathy w/ hx DVT and PE  . Myalgia and myositis, unspecified   . OA (osteoarthritis)    "all joints"  . Pinched nerve in neck    per pt causes intermittant numbness bilateral arms and hands  . Primary hypercoagulable state ([place])   . Prostate cancer [facility]) urologist-  dr Alyson Ingles  oncologist-- dr Tammi Klippel   dx 08-28-2017  Stage T1c, Gleason 4+4, PSA 14.1, vol 42.8cc-- planned treatment external radiation and ADT  . Right bundle branch block   . Seborrheic dermatitis, unspecified   . Type 2 diabetes mellitus ([place])   . Urinary frequency   . Wears glasses    Past Surgical History:  Procedure Laterality Date  . CHOLECYSTECTOMY OPEN  1981  . COLONOSCOPY  last one 01-28-2017  . GOLD SEED IMPLANT N/A 11/16/2017   Procedure: GOLD SEED IMPLANT;  Surgeon: Cleon Gustin, MD;  Location: [place];  Service: Urology;  Laterality: N/A;  . KNEE ARTHROSCOPY Right 1990s  . PILONIDAL CYST EXCISION  1968  . PROSTATE BIOPSY  09/10/2017    (done in office)   + Pos for Prostate Cancer, Dr.MCkinzie( [facility])   . SPACE OAR INSTILLATION N/A 11/16/2017   Procedure: SPACE OAR INSTILLATION;  Surgeon: Cleon Gustin, MD;  Location: [facility];  Service: Urology;  Laterality: N/A;  . TOTAL HIP ARTHROPLASTY Right 06-18-2009   dr Wynelle Link  [facility]   Social History:    reports that he quit smoking about 8 years ago. His smoking use included cigarettes. He quit after 50.00 years of use. he has never used smokeless tobacco. He reports that he drinks about 1.8 oz of alcohol per week. He reports that he does not use drugs.  Family History  Problem Relation Age of Onset  . Cancer Mother 16       colon , breast  . Colon cancer Mother   . Heart attack Daughter        2008  . Parkinson's disease Sister   . Cancer Sister        breast  . Cancer Maternal Uncle        melanoma  . Cancer Paternal Uncle     Medications: Patient's Medications  New Prescriptions   No medications on file  Previous Medications   BIOFLAVONOID PRODUCTS (BIOFLEX) TABS    Take 1 tablet by mouth every morning.   BLOOD GLUCOSE MONITORING SUPPL (ONETOUCH VERIO) W/DEVICE KIT    by Does not apply route. Check blood sugar twice daily as directed DX E11.65   CHOLECALCIFEROL (VITAMIN D) 2000 UNITS CAPS    Take 1 capsule by mouth daily.     DOCUSATE SODIUM (COLACE) 100 MG CAPSULE    Take 100 mg by mouth at bedtime.   GLUCOSE BLOOD (ONETOUCH VERIO) TEST STRIP    Check blood sugar twice daily as directed E11.65   LISINOPRIL (PRINIVIL,ZESTRIL) 20 MG TABLET    Take 10 mg by mouth daily.   MELATONIN 5 MG CAPS    Take 1 capsule (5 mg total) by mouth at bedtime.   METFORMIN (GLUCOPHAGE) 500 MG TABLET    Take 1 tablet (500 mg total) by mouth daily with breakfast.   NYSTATIN (MYCOSTATIN/NYSTOP) POWDER    Apply 100,000 g topically daily as needed. Apply to groin area for yeast/fungal infection   ONETOUCH DELICA LANCETS 30Q MISC    1 each by Does not apply route 3 (three) times daily. Check blood sugar twice daily E11.65   SIMVASTATIN (ZOCOR) 20 MG TABLET    TAKE 1 TABLET BY MOUTH ONCE DAILY FOR CHOLESTEROL   TRAMADOL (ULTRAM) 50 MG TABLET    Take 1 tablet (50 mg total) by mouth every 6 (six) hours as needed.   TRIAMCINOLONE LOTION (KENALOG) 0.1 %    as needed. Use once daily as needed for skin  irritation behind left ear    WARFARIN (COUMADIN) 10 MG TABLET    TAKE 1 TABLET BY MOUTH ONCE DAILY ALONG  WITH  A  5  MG  TABLET  FOR  A  TOTAL  OF  15  MG  DAILY   WARFARIN (COUMADIN) 5 MG TABLET    Take 5 mg along with 10 mg for a total of 17m daily except 7.557mwith 1051mn Mondays, Wednesday and Fridays for a total of 17.5mg45modified Medications   Modified Medication Previous Medication  ZOSTER VACCINE ADJUVANTED [facility]) INJECTION Zoster Vaccine Adjuvanted [facility]) injection      Inject 0.5 mLs into the muscle once for 1 dose.    Inject 0.5 mLs into the muscle once.  Discontinued Medications   No medications on file     Physical Exam:  Vitals:   12/02/17 1255  BP: 132/70  Pulse: 73  Temp: 97.6 F (36.4 C)  TempSrc: Oral  SpO2: 96%  Weight: 240 lb (108.9 kg)  Height: _0  (1.651 m)   Body mass index is 39.94 kg/m.  Physical Exam  Constitutional: He is oriented to person, place, and time. He appears well-developed and well-nourished. No distress.  HENT:  Head: Normocephalic and atraumatic.  Mouth/Throat: Oropharynx is clear and moist. No oropharyngeal exudate.  Eyes: Conjunctivae and EOM are normal. Pupils are equal, round, and reactive to light.  Neck: Normal range of motion. Neck supple.  Cardiovascular: Normal rate, regular rhythm and normal heart sounds.  Pulmonary/Chest: Effort normal and breath sounds normal.  Abdominal: Soft. Bowel sounds are normal.  Obese abdomen   Musculoskeletal: He exhibits edema (1+ bilaterally). He exhibits no tenderness.  Neurological: He is alert and oriented to person, place, and time.  Skin: Skin is warm and dry. He is not diaphoretic.  Psychiatric: He has a normal mood and affect.    Labs reviewed: Basic Metabolic Panel: Recent Labs    06/11/17 0900 11/16/17 1438  NA 137 139  K 4.4 4.0  CL 103 102  CO2 24  --   GLUCOSE 106* 115*  BUN 20 20  CREATININE 1.16 0.90  CALCIUM 9.5  --    Liver Function Tests: Recent  Labs    06/11/17 0900  AST 20  ALT 21  BILITOT 0.7  PROT 7.0   No results for input(s): LIPASE, AMYLASE in the last 8760 hours. No results for input(s): AMMONIA in the last 8760 hours. CBC: Recent Labs    06/11/17 0900 11/16/17 1438  WBC 7.4  --   NEUTROABS 4,144  --   HGB 14.0 13.3  HCT 42.6 39.0  MCV 84.2  --   PLT 231  --    Lipid Panel: Recent Labs    06/11/17 0900  CHOL 111  HDL 47  LDLCALC 49  TRIG 70  CHOLHDL 2.4   TSH: No results for input(s): TSH in the last 8760 hours. A1C: Lab Results  Component Value Date   HGBA1C 6.3 (H) 06/11/2017     Assessment/Plan  1. Essential hypertension -stable, to continue on lisinopril and dietary modifications.    2. Type 2 diabetes mellitus without complication, without long-term current use of insulin (HCC) -continues on metformin daily at breakfast, encouraged dietary modifications.  - Hemoglobin A1c   3. Hyperlipidemia, unspecified hyperlipidemia type -LDL at goal on zocor  - Hepatic Function Panel   4. Malignant neoplasm of prostate [facility] -recently with radioactive gold seed implantation, following with urology and oncology     5. Primary hypercoagulable state ([place]) Due to DVT/PE, on coumadin, following with [facility] for INR management.    6. Insomnia, unspecified type Controlled on melatonin    7. Urinary frequency Stable, some days are better than other.    Next appt: 12/07/2017 Carlos American. [place], [facility] [phone]

## 2017-12-02 NOTE — Patient Instructions (Addendum)
To continue to take blood pressure at home after medication  Makes sure you have been sitting for at least 5 mins   To increase stool softener/laxative to 2 tablets daily and make sure you are drinking enough water   DASH Eating Plan DASH stands for "Dietary Approaches to Stop Hypertension." The DASH eating plan is a healthy eating plan that has been shown to reduce high blood pressure (hypertension). It may also reduce your risk for type 2 diabetes, heart disease, and stroke. The DASH eating plan may also help with weight loss. What are tips for following this plan? General guidelines  Avoid eating more than 2,300 mg (milligrams) of salt (sodium) a day. If you have hypertension, you may need to reduce your sodium intake to 1,500 mg a day.  Limit alcohol intake to no more than 1 drink a day for nonpregnant women and 2 drinks a day for men. One drink equals 12 oz of beer, 5 oz of wine, or 1 oz of hard liquor.  Work with your health care provider to maintain a healthy body weight or to lose weight. Ask what an ideal weight is for you.  Get at least 30 minutes of exercise that causes your heart to beat faster (aerobic exercise) most days of the week. Activities may include walking, swimming, or biking.  Work with your health care provider or diet and nutrition specialist (dietitian) to adjust your eating plan to your individual calorie needs. Reading food labels  Check food labels for the amount of sodium per serving. Choose foods with less than 5 percent of the Daily Value of sodium. Generally, foods with less than 300 mg of sodium per serving fit into this eating plan.  To find whole grains, look for the word "whole" as the first word in the ingredient list. Shopping  Buy products labeled as "low-sodium" or "no salt added."  Buy fresh foods. Avoid canned foods and premade or frozen meals. Cooking  Avoid adding salt when cooking. Use salt-free seasonings or herbs instead of table  salt or sea salt. Check with your health care provider or pharmacist before using salt substitutes.  Do not fry foods. Cook foods using healthy methods such as baking, boiling, grilling, and broiling instead.  Cook with heart-healthy oils, such as olive, canola, soybean, or sunflower oil. Meal planning   Eat a balanced diet that includes: ? 5 or more servings of fruits and vegetables each day. At each meal, try to fill half of your plate with fruits and vegetables. ? Up to 6-8 servings of whole grains each day. ? Less than 6 oz of lean meat, poultry, or fish each day. A 3-oz serving of meat is about the same size as a deck of cards. One egg equals 1 oz. ? 2 servings of low-fat dairy each day. ? A serving of nuts, seeds, or beans 5 times each week. ? Heart-healthy fats. Healthy fats called Omega-3 fatty acids are found in foods such as flaxseeds and coldwater fish, like sardines, salmon, and mackerel.  Limit how much you eat of the following: ? Canned or prepackaged foods. ? Food that is high in trans fat, such as fried foods. ? Food that is high in saturated fat, such as fatty meat. ? Sweets, desserts, sugary drinks, and other foods with added sugar. ? Full-fat dairy products.  Do not salt foods before eating.  Try to eat at least 2 vegetarian meals each week.  Eat more home-cooked food and less restaurant,  buffet, and fast food.  When eating at a restaurant, ask that your food be prepared with less salt or no salt, if possible. What foods are recommended? The items listed may not be a complete list. Talk with your dietitian about what dietary choices are best for you. Grains Whole-grain or whole-wheat bread. Whole-grain or whole-wheat pasta. Brown rice. Modena Morrow. Bulgur. Whole-grain and low-sodium cereals. Pita bread. Low-fat, low-sodium crackers. Whole-wheat flour tortillas. Vegetables Fresh or frozen vegetables (raw, steamed, roasted, or grilled). Low-sodium or  reduced-sodium tomato and vegetable juice. Low-sodium or reduced-sodium tomato sauce and tomato paste. Low-sodium or reduced-sodium canned vegetables. Fruits All fresh, dried, or frozen fruit. Canned fruit in natural juice (without added sugar). Meat and other protein foods Skinless chicken or [place]. Ground chicken or [place]. Pork with fat trimmed off. Fish and seafood. Egg whites. Dried beans, peas, or lentils. Unsalted nuts, nut butters, and seeds. Unsalted canned beans. Lean cuts of beef with fat trimmed off. Low-sodium, lean deli meat. Dairy Low-fat (1%) or fat-free (skim) milk. Fat-free, low-fat, or reduced-fat cheeses. Nonfat, low-sodium ricotta or cottage cheese. Low-fat or nonfat yogurt. Low-fat, low-sodium cheese. Fats and oils Soft margarine without trans fats. Vegetable oil. Low-fat, reduced-fat, or light mayonnaise and salad dressings (reduced-sodium). Canola, safflower, olive, soybean, and sunflower oils. Avocado. Seasoning and other foods Herbs. Spices. Seasoning mixes without salt. Unsalted popcorn and pretzels. Fat-free sweets. What foods are not recommended? The items listed may not be a complete list. Talk with your dietitian about what dietary choices are best for you. Grains Baked goods made with fat, such as croissants, muffins, or some breads. Dry pasta or rice meal packs. Vegetables Creamed or fried vegetables. Vegetables in a cheese sauce. Regular canned vegetables (not low-sodium or reduced-sodium). Regular canned tomato sauce and paste (not low-sodium or reduced-sodium). Regular tomato and vegetable juice (not low-sodium or reduced-sodium). Angie Fava. Olives. Fruits Canned fruit in a light or heavy syrup. Fried fruit. Fruit in cream or butter sauce. Meat and other protein foods Fatty cuts of meat. Ribs. Fried meat. Berniece Salines. Sausage. Bologna and other processed lunch meats. Salami. Fatback. Hotdogs. Bratwurst. Salted nuts and seeds. Canned beans with added salt. Canned or  smoked fish. Whole eggs or egg yolks. Chicken or [place] with skin. Dairy Whole or 2% milk, cream, and half-and-half. Whole or full-fat cream cheese. Whole-fat or sweetened yogurt. Full-fat cheese. Nondairy creamers. Whipped toppings. Processed cheese and cheese spreads. Fats and oils Butter. Stick margarine. Lard. Shortening. Ghee. Bacon fat. Tropical oils, such as coconut, palm kernel, or palm oil. Seasoning and other foods Salted popcorn and pretzels. Onion salt, garlic salt, seasoned salt, table salt, and sea salt. Worcestershire sauce. Tartar sauce. Barbecue sauce. Teriyaki sauce. Soy sauce, including reduced-sodium. Steak sauce. Canned and packaged gravies. Fish sauce. Oyster sauce. Cocktail sauce. Horseradish that you find on the shelf. Ketchup. Mustard. Meat flavorings and tenderizers. Bouillon cubes. Hot sauce and Tabasco sauce. Premade or packaged marinades. Premade or packaged taco seasonings. Relishes. Regular salad dressings. Where to find more information:  National Heart, Lung, and [place]: https://wilson-eaton.com/  American Heart Association: www.heart.org Summary  The DASH eating plan is a healthy eating plan that has been shown to reduce high blood pressure (hypertension). It may also reduce your risk for type 2 diabetes, heart disease, and stroke.  With the DASH eating plan, you should limit salt (sodium) intake to 2,300 mg a day. If you have hypertension, you may need to reduce your sodium intake to 1,500 mg a day.  When  on the DASH eating plan, aim to eat more fresh fruits and vegetables, whole grains, lean proteins, low-fat dairy, and heart-healthy fats.  Work with your health care provider or diet and nutrition specialist (dietitian) to adjust your eating plan to your individual calorie needs. This information is not intended to replace advice given to you by your health care provider. Make sure you discuss any questions you have with your health care provider. Document  Released: 08/28/2011 Document Revised: 09/01/2016 Document Reviewed: 09/01/2016 Elsevier Interactive Patient Education  [employer].

## 2017-12-03 LAB — HEMOGLOBIN A1C
HEMOGLOBIN A1C: 6.7 %{Hb} — AB (ref <5.7)
Mean Plasma Glucose: 146 (calc)
eAG (mmol/L): 8.1 (calc)

## 2017-12-03 LAB — HEPATIC FUNCTION PANEL
AG Ratio: 1.7 (calc) (ref 1.0–2.5)
ALBUMIN MSPROF: 4.1 g/dL (ref 3.6–5.1)
ALT: 45 U/L (ref 9–46)
AST: 28 U/L (ref 10–35)
Alkaline phosphatase (APISO): 81 U/L (ref 40–115)
BILIRUBIN INDIRECT: 0.3 mg/dL (ref 0.2–1.2)
Bilirubin, Direct: 0.1 mg/dL (ref 0.0–0.2)
GLOBULIN: 2.4 g/dL (ref 1.9–3.7)
TOTAL PROTEIN: 6.5 g/dL (ref 6.1–8.1)
Total Bilirubin: 0.4 mg/dL (ref 0.2–1.2)

## 2017-12-07 ENCOUNTER — Encounter: Payer: Self-pay | Admitting: Pharmacotherapy

## 2017-12-07 ENCOUNTER — Ambulatory Visit (INDEPENDENT_AMBULATORY_CARE_PROVIDER_SITE_OTHER): Payer: PPO | Admitting: Pharmacotherapy

## 2017-12-07 DIAGNOSIS — D6859 Other primary thrombophilia: Secondary | ICD-10-CM | POA: Diagnosis not present

## 2017-12-07 DIAGNOSIS — Z7901 Long term (current) use of anticoagulants: Secondary | ICD-10-CM

## 2017-12-07 LAB — POCT INR: INR: 1.7

## 2017-12-07 NOTE — Patient Instructions (Signed)
INR 1.7  Increase Coumadin to 17.5mg  on Mondays, Wednesdays, Fridays, and Saturdays.  Take 15mg  on Sundays, Tuesdays, and Thursdays

## 2017-12-07 NOTE — Progress Notes (Signed)
   Subjective:    Patient ID: Colton Miranda, male    DOB: 24-Aug-1941, 77 y.o.   MRN: [id number]  HPI Last INR on 11/23/17 was low at 1.3 due to increased vitamin K intake. Coumadin was boosted, then restarted at 15mg  QD except 17.5mg  MWF Denies missed doses. Denies unusual bleeding or bruising Consistent with vitamin K intake No CP, SOB, falls  Will start radiation on 12/14/17  Average BG: 106mg /dl   Review of Systems  HENT: Negative for nosebleeds.   Respiratory: Negative for shortness of breath.   Cardiovascular: Negative for chest pain.  Gastrointestinal: Negative for anal bleeding and blood in stool.  Genitourinary: Negative for hematuria.  Hematological: Does not bruise/bleed easily.       Objective:   Physical Exam  Constitutional: He is oriented to person, place, and time. He appears well-developed and well-nourished.  HENT:  Right Ear: External ear normal.  Left Ear: External ear normal.  Cardiovascular: Normal rate, regular rhythm and normal heart sounds.  Pulmonary/Chest: Effort normal and breath sounds normal.  Neurological: He is alert and oriented to person, place, and time.  Skin: Skin is warm and dry.  Psychiatric: He has a normal mood and affect. His behavior is normal. Judgment and thought content normal.  Vitals reviewed.   BP: 130/76  HR: 49   Wt:  237lb INR 1.7      Assessment & Plan:  1.   INR below goal 2-3 2.  Increase Coumadin 17.5mg  daily except 15mg  Su/T/Th 3.  Will start radiation 5 days a week x 2 months on 12/14/17 4.  RTC 2 week

## 2017-12-14 ENCOUNTER — Ambulatory Visit
Admission: RE | Admit: 2017-12-14 | Discharge: 2017-12-14 | Disposition: A | Discharge status: Home / Self Care | Payer: PPO | Source: Ambulatory Visit | Attending: Radiation Oncology | Admitting: Radiation Oncology

## 2017-12-14 DIAGNOSIS — C61 Malignant neoplasm of prostate: Secondary | ICD-10-CM | POA: Diagnosis not present

## 2017-12-15 ENCOUNTER — Ambulatory Visit
Admission: RE | Admit: 2017-12-15 | Discharge: 2017-12-15 | Disposition: A | Discharge status: Home / Self Care | Payer: PPO | Source: Ambulatory Visit | Attending: Radiation Oncology | Admitting: Radiation Oncology

## 2017-12-15 DIAGNOSIS — C61 Malignant neoplasm of prostate: Secondary | ICD-10-CM | POA: Diagnosis not present

## 2017-12-16 ENCOUNTER — Ambulatory Visit
Admission: RE | Admit: 2017-12-16 | Discharge: 2017-12-16 | Disposition: A | Discharge status: Home / Self Care | Payer: PPO | Source: Ambulatory Visit | Attending: Radiation Oncology | Admitting: Radiation Oncology

## 2017-12-16 DIAGNOSIS — C61 Malignant neoplasm of prostate: Secondary | ICD-10-CM | POA: Diagnosis not present

## 2017-12-17 ENCOUNTER — Ambulatory Visit
Admission: RE | Admit: 2017-12-17 | Discharge: 2017-12-17 | Disposition: A | Discharge status: Home / Self Care | Payer: PPO | Source: Ambulatory Visit | Attending: Radiation Oncology | Admitting: Radiation Oncology

## 2017-12-17 DIAGNOSIS — C61 Malignant neoplasm of prostate: Secondary | ICD-10-CM | POA: Diagnosis not present

## 2017-12-18 ENCOUNTER — Ambulatory Visit
Admission: RE | Admit: 2017-12-18 | Discharge: 2017-12-18 | Disposition: A | Discharge status: Home / Self Care | Payer: PPO | Source: Ambulatory Visit | Attending: Radiation Oncology | Admitting: Radiation Oncology

## 2017-12-18 DIAGNOSIS — C61 Malignant neoplasm of prostate: Secondary | ICD-10-CM | POA: Diagnosis not present

## 2017-12-18 NOTE — Progress Notes (Signed)
Pt here for patient teaching.  Pt given Radiation and You booklet and skin care instructions.  Reviewed areas of pertinence such as diarrhea, fatigue, skin changes and urinary and bladder changes . Pt able to give teach back of use baby wipes, have Imodium on hand and drink plenty of water,avoid applying anything to skin within 4 hours of treatment. Pt verbalizes understanding of information given and will contact nursing with any questions or concerns.     Http://rtanswers.org/treatmentinformation/whattoexpect/index

## 2017-12-21 ENCOUNTER — Ambulatory Visit
Admission: RE | Admit: 2017-12-21 | Discharge: 2017-12-21 | Disposition: A | Discharge status: Home / Self Care | Payer: PPO | Source: Ambulatory Visit | Attending: Radiation Oncology | Admitting: Radiation Oncology

## 2017-12-21 ENCOUNTER — Ambulatory Visit (INDEPENDENT_AMBULATORY_CARE_PROVIDER_SITE_OTHER): Payer: PPO | Admitting: Nurse Practitioner

## 2017-12-21 ENCOUNTER — Ambulatory Visit: Payer: PPO | Admitting: Pharmacotherapy

## 2017-12-21 ENCOUNTER — Encounter: Payer: Self-pay | Admitting: Nurse Practitioner

## 2017-12-21 VITALS — BP 104/68 | HR 73 | Temp 98.7°F | Ht 65.0 in | Wt 240.0 lb

## 2017-12-21 DIAGNOSIS — Z510 Encounter for antineoplastic radiation therapy: Secondary | ICD-10-CM | POA: Insufficient documentation

## 2017-12-21 DIAGNOSIS — C61 Malignant neoplasm of prostate: Secondary | ICD-10-CM | POA: Insufficient documentation

## 2017-12-21 DIAGNOSIS — D6859 Other primary thrombophilia: Secondary | ICD-10-CM

## 2017-12-21 DIAGNOSIS — Z7901 Long term (current) use of anticoagulants: Secondary | ICD-10-CM

## 2017-12-21 LAB — POCT INR: INR: 2

## 2017-12-21 NOTE — Patient Instructions (Signed)
Make sure you are taking Coumadin 17.5 mg daily except 15mg  Su/T/Th  Follow up in 2 weeks with [facility] for INR check

## 2017-12-21 NOTE — Progress Notes (Signed)
Careteam: Patient Care Team: Lauree Chandler, NP as PCP - General (Geriatric Medicine) [name], MD as Consulting Physician (Orthopedic Surgery) [name], MD as Consulting Physician (Cardiology)  Advanced Directive information    No Known Allergies  Chief Complaint  Patient presents with  . Follow-up    Pt is being seen for an INR check. Current INR is 2.0. Previous INR was 1.7 on 12/07/17.      HPI: Patient is a 77 y.o. male seen in the office today for INR check. INR 2.0 today, reports he is taking 15 mg on Saturday vs 17.5 mg daily which is listed  Instructions  Coumadin 17.45m daily except 170mSu/T/Th Has not missed doses No unusual bleeding or bruising.  No changes in diet or medication.  No chest pains or shortness of breath   Review of Systems:  Review of Systems  Constitutional: Negative for chills, diaphoresis and fever.  Respiratory: Negative for hemoptysis.   Cardiovascular: Negative for chest pain and leg swelling.  Gastrointestinal: Negative for blood in stool.  Genitourinary: Negative for hematuria.  Skin: Negative for itching and rash.  Neurological: Negative for dizziness and headaches.  Endo/Heme/Allergies: Bruises/bleeds easily.    Past Medical History:  Diagnosis Date  . BPH associated with nocturia   . Chronic constipation   . COPD (ch[place] obstructive pulmonary disease) ([place]  . History of adenomatous polyp of colon   . History of basal cell carcinoma (BCC) of skin    post excision from trunk  . 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 20 years ago , 1999, caused by blood clot after knee scope"  DVT to PE  . History of DVT (deep vein thrombosis)    11-06-2017 per pt and wife happened lower leg approx. 1999 after knee scope  . History of pulmonary embolus (PE)    11-06-2017 from DVT in approx. 1999 per wife and pt  . Hyperlipidemia   . Hypertension      followed by pcp  . Long term (current) use of anticoagulants Coumadin-- [facility] to [facility]agulopathy w/ hx DVT and PE  . Myalgia and myositis, unspecified   . OA (osteoarthritis)    "all joints"  . Pinched nerve in neck    per pt causes intermittant numbness bilateral arms and hands  . Primary hypercoagulable state ([place]  . [facility]state [facility]-  dr mcAlyson Inglesoncologist-- dr maTammi Klippel dx 08-28-2017  Stage T1c, Gleason 4+4, PSA 14.1, vol 42.8cc-- planned treatment external radiation and ADT  . Right bundle branch block   . Seborrheic dermatitis, unspecified   . Type 2 diabetes mellitus ([place]  . Urinary frequency   . Wears glasses    Past Surgical History:  Procedure Laterality Date  . CHOLECYSTECTOMY OPEN  1981  . COLONOSCOPY  last one 01-28-2017  . GOLD SEED IMPLANT N/A 11/16/2017   Procedure: GOLD SEED IMPLANT;  Surgeon: McCleon GustinMD;  [facility];  Laterality: N/A;  . KNEE ARTHROSCOPY Right 1990s  . PILONIDAL CYST EXCISION  1968  . PROSTATE BIOPSY  09/10/2017    (done in office)   + Pos for Prostate Cancer, Dr.MCkinzie( [facility])   . SPACE OAR INSTILLATION N/A 11/16/2017   Procedure: SPACE OAR INSTILLATION;  Surgeon: McCleon GustinMD;  Location: [facility];  Service: Urology;  Laterality: N/A;  . TOTAL HIP ARTHROPLASTY Right 06-18-2009   dr Wynelle Link  [facility]   Social History:   reports that he quit smoking about 8 years ago. His smoking use included cigarettes. He quit after 50.00 years of use. He has never used smokeless tobacco. He reports that he drinks about 1.8 oz of alcohol per week. He reports that he does not use drugs.  Family History  Problem Relation Age of Onset  . Cancer Mother 37       colon , breast  . Colon cancer Mother   . Heart attack Daughter        2008  . Parkinson's disease Sister   . Cancer  Sister        breast  . Cancer Maternal Uncle        melanoma  . Cancer Paternal Uncle     Medications: Patient's Medications  New Prescriptions   No medications on file  Previous Medications   BIOFLAVONOID PRODUCTS (BIOFLEX) TABS    Take 1 tablet by mouth every morning.   BLOOD GLUCOSE MONITORING SUPPL (ONETOUCH VERIO) W/DEVICE KIT    by Does not apply route. Check blood sugar twice daily as directed DX E11.65   CHOLECALCIFEROL (VITAMIN D) 2000 UNITS CAPS    Take 1 capsule by mouth daily.     DOCUSATE SODIUM (COLACE) 100 MG CAPSULE    Take 100 mg by mouth at bedtime.   GLUCOSE BLOOD (ONETOUCH VERIO) TEST STRIP    Check blood sugar twice daily as directed E11.65   LEUPROLIDE ACETATE, 3 MONTH, (LUPRON DEPOT, 11-MONTH, IM)    Give injection based on recommendations of Urologist.   LISINOPRIL (PRINIVIL,ZESTRIL) 20 MG TABLET    Take 10 mg by mouth daily.   MELATONIN 5 MG CAPS    Take 1 capsule (5 mg total) by mouth at bedtime.   METFORMIN (GLUCOPHAGE) 500 MG TABLET    Take 1 tablet (500 mg total) by mouth daily with breakfast.   NYSTATIN (MYCOSTATIN/NYSTOP) POWDER    Apply 100,000 g topically daily as needed. Apply to groin area for yeast/fungal infection   ONETOUCH DELICA LANCETS 54Y MISC    1 each by Does not apply route 3 (three) times daily. Check blood sugar twice daily E11.65   SIMVASTATIN (ZOCOR) 20 MG TABLET    TAKE 1 TABLET BY MOUTH ONCE DAILY FOR CHOLESTEROL   TRAMADOL (ULTRAM) 50 MG TABLET    Take 1 tablet (50 mg total) by mouth every 6 (six) hours as needed.   TRIAMCINOLONE LOTION (KENALOG) 0.1 %    as needed. Use once daily as needed for skin irritation behind left ear    WARFARIN (COUMADIN) 10 MG TABLET    TAKE 1 TABLET BY MOUTH ONCE DAILY ALONG  WITH  A  5  MG  TABLET  FOR  A  TOTAL  OF  15  MG  DAILY   WARFARIN (COUMADIN) 5 MG TABLET    Take 5 mg along with 10 mg for a total of 37m daily except 7.561mwith 1055mn Mondays, Wednesday and Fridays and Saturdays for a total of  17.5mg39modified Medications   No medications on file  Discontinued Medications   No medications on file     Physical Exam:  Vitals:   12/21/17 1017  BP: 108/68  Pulse: 73  Temp: 98.7 F (37.1 C)  TempSrc: Oral  SpO2: 99%  Weight: 240 lb (108.9 kg)  Height: 5' 5"  (1.651 m)   Body mass index is 39.94 kg/m.  Physical Exam  Constitutional: He is oriented to person, place, and time. He appears well-developed and well-nourished.  HENT:  Right Ear: External ear normal.  Left Ear: External ear normal.  Cardiovascular: Normal rate, regular rhythm and normal heart sounds.  Pulmonary/Chest: Effort normal and breath sounds normal.  Neurological: He is alert and oriented to person, place, and time.  Skin: Skin is warm and dry.  Psychiatric: He has a normal mood and affect. His behavior is normal. Judgment and thought content normal.  Vitals reviewed.   Labs reviewed: Basic Metabolic Panel: Recent Labs    06/11/17 0900 11/16/17 1438  NA 137 139  K 4.4 4.0  CL 103 102  CO2 24  --   GLUCOSE 106* 115*  BUN 20 20  CREATININE 1.16 0.90  CALCIUM 9.5  --    Liver Function Tests: Recent Labs    06/11/17 0900 12/02/17 1352  AST 20 28  ALT 21 45  BILITOT 0.7 0.4  PROT 7.0 6.5   No results for input(s): LIPASE, AMYLASE in the last 8760 hours. No results for input(s): AMMONIA in the last 8760 hours. CBC: Recent Labs    06/11/17 0900 11/16/17 1438  WBC 7.4  --   NEUTROABS 4,144  --   HGB 14.0 13.3  HCT 42.6 39.0  MCV 84.2  --   PLT 231  --    Lipid Panel: Recent Labs    06/11/17 0900  CHOL 111  HDL 47  LDLCALC 49  TRIG 70  CHOLHDL 2.4   TSH: No results for input(s): TSH in the last 8760 hours. A1C: Lab Results  Component Value Date   HGBA1C 6.7 (H) 12/02/2017     Assessment/Plan 1. Long term current use of anticoagulant therapy 2. Primary hypercoagulable state ([place])  INR goal 2-3 INR 2.0  not taking coumadin 17.5 mg on saturdays will start  doing this and continue the Coumadin 17.27m daily except 153mSu/T/Th schedule.   Next appt: 01/04/2018 for INR  Jessica K. Eu[facility]AG[facility]ult [facility] [phone]

## 2017-12-22 ENCOUNTER — Ambulatory Visit
Admission: RE | Admit: 2017-12-22 | Discharge: 2017-12-22 | Disposition: A | Discharge status: Home / Self Care | Payer: PPO | Source: Ambulatory Visit | Attending: Radiation Oncology | Admitting: Radiation Oncology

## 2017-12-22 ENCOUNTER — Encounter: Payer: Self-pay | Admitting: Medical Oncology

## 2017-12-22 DIAGNOSIS — C61 Malignant neoplasm of prostate: Secondary | ICD-10-CM | POA: Diagnosis not present

## 2017-12-23 ENCOUNTER — Ambulatory Visit
Admission: RE | Admit: 2017-12-23 | Discharge: 2017-12-23 | Disposition: A | Discharge status: Home / Self Care | Payer: PPO | Source: Ambulatory Visit | Attending: Radiation Oncology | Admitting: Radiation Oncology

## 2017-12-23 DIAGNOSIS — C61 Malignant neoplasm of prostate: Secondary | ICD-10-CM | POA: Diagnosis not present

## 2017-12-24 ENCOUNTER — Ambulatory Visit
Admission: RE | Admit: 2017-12-24 | Discharge: 2017-12-24 | Disposition: A | Discharge status: Home / Self Care | Payer: PPO | Source: Ambulatory Visit | Attending: Radiation Oncology | Admitting: Radiation Oncology

## 2017-12-24 DIAGNOSIS — H2513 Age-related nuclear cataract, bilateral: Secondary | ICD-10-CM | POA: Diagnosis not present

## 2017-12-24 DIAGNOSIS — E119 Type 2 diabetes mellitus without complications: Secondary | ICD-10-CM | POA: Diagnosis not present

## 2017-12-24 DIAGNOSIS — C61 Malignant neoplasm of prostate: Secondary | ICD-10-CM | POA: Diagnosis not present

## 2017-12-24 DIAGNOSIS — H04123 Dry eye syndrome of bilateral lacrimal glands: Secondary | ICD-10-CM | POA: Diagnosis not present

## 2017-12-24 LAB — HM DIABETES EYE EXAM

## 2017-12-25 ENCOUNTER — Ambulatory Visit
Admission: RE | Admit: 2017-12-25 | Discharge: 2017-12-25 | Disposition: A | Discharge status: Home / Self Care | Payer: PPO | Source: Ambulatory Visit | Attending: Radiation Oncology | Admitting: Radiation Oncology

## 2017-12-25 DIAGNOSIS — C61 Malignant neoplasm of prostate: Secondary | ICD-10-CM | POA: Diagnosis not present

## 2017-12-28 ENCOUNTER — Ambulatory Visit
Admission: RE | Admit: 2017-12-28 | Discharge: 2017-12-28 | Disposition: A | Discharge status: Home / Self Care | Payer: PPO | Source: Ambulatory Visit | Attending: Radiation Oncology | Admitting: Radiation Oncology

## 2017-12-28 DIAGNOSIS — C61 Malignant neoplasm of prostate: Secondary | ICD-10-CM | POA: Diagnosis not present

## 2017-12-29 ENCOUNTER — Ambulatory Visit
Admission: RE | Admit: 2017-12-29 | Discharge: 2017-12-29 | Disposition: A | Discharge status: Home / Self Care | Payer: PPO | Source: Ambulatory Visit | Attending: Radiation Oncology | Admitting: Radiation Oncology

## 2017-12-29 DIAGNOSIS — C61 Malignant neoplasm of prostate: Secondary | ICD-10-CM | POA: Diagnosis not present

## 2017-12-30 ENCOUNTER — Ambulatory Visit
Admission: RE | Admit: 2017-12-30 | Discharge: 2017-12-30 | Disposition: A | Discharge status: Home / Self Care | Payer: PPO | Source: Ambulatory Visit | Attending: Radiation Oncology | Admitting: Radiation Oncology

## 2017-12-30 DIAGNOSIS — C61 Malignant neoplasm of prostate: Secondary | ICD-10-CM | POA: Diagnosis not present

## 2017-12-31 ENCOUNTER — Ambulatory Visit
Admission: RE | Admit: 2017-12-31 | Discharge: 2017-12-31 | Disposition: A | Discharge status: Home / Self Care | Payer: PPO | Source: Ambulatory Visit | Attending: Radiation Oncology | Admitting: Radiation Oncology

## 2017-12-31 DIAGNOSIS — C61 Malignant neoplasm of prostate: Secondary | ICD-10-CM | POA: Diagnosis not present

## 2018-01-01 ENCOUNTER — Ambulatory Visit
Admission: RE | Admit: 2018-01-01 | Discharge: 2018-01-01 | Disposition: A | Discharge status: Home / Self Care | Payer: PPO | Source: Ambulatory Visit | Attending: Radiation Oncology | Admitting: Radiation Oncology

## 2018-01-01 DIAGNOSIS — C61 Malignant neoplasm of prostate: Secondary | ICD-10-CM | POA: Diagnosis not present

## 2018-01-04 ENCOUNTER — Encounter: Payer: Self-pay | Admitting: Pharmacotherapy

## 2018-01-04 ENCOUNTER — Ambulatory Visit: Payer: PPO | Admitting: Pharmacotherapy

## 2018-01-04 ENCOUNTER — Ambulatory Visit
Admission: RE | Admit: 2018-01-04 | Discharge: 2018-01-04 | Disposition: A | Discharge status: Home / Self Care | Payer: PPO | Source: Ambulatory Visit | Attending: Radiation Oncology | Admitting: Radiation Oncology

## 2018-01-04 ENCOUNTER — Ambulatory Visit (INDEPENDENT_AMBULATORY_CARE_PROVIDER_SITE_OTHER): Payer: PPO | Admitting: Pharmacotherapy

## 2018-01-04 VITALS — BP 116/78 | HR 48 | Resp 10 | Ht 65.0 in | Wt 242.0 lb

## 2018-01-04 DIAGNOSIS — D6859 Other primary thrombophilia: Secondary | ICD-10-CM | POA: Diagnosis not present

## 2018-01-04 DIAGNOSIS — C61 Malignant neoplasm of prostate: Secondary | ICD-10-CM | POA: Diagnosis not present

## 2018-01-04 DIAGNOSIS — E119 Type 2 diabetes mellitus without complications: Secondary | ICD-10-CM

## 2018-01-04 DIAGNOSIS — Z7901 Long term (current) use of anticoagulants: Secondary | ICD-10-CM

## 2018-01-04 LAB — POCT INR: INR: 1.7

## 2018-01-04 MED ORDER — TRAMADOL HCL 50 MG PO TABS: 50.0000 mg | ORAL_TABLET | Freq: Four times a day (QID) | ORAL | 0 refills | Status: DC | PRN

## 2018-01-04 NOTE — Patient Instructions (Signed)
Coumadin 17.5mg  daily except 15mg  on Sundays, Tuesdays, Thursdays

## 2018-01-04 NOTE — Progress Notes (Signed)
   Subjective:    Patient ID: Colton Miranda, male    DOB: September 20, 1941, 77 y.o.   MRN: [id number]  HPI Last INR on 12/21/17 was low at 2.0. Coumadin was increased to 17.5mg  daily except 15mg  on Sundays, Tuesdays, and Thursdays. However, this past week, he forgot to increase to 17.5mg  on the days he should and was only taking 15mg  daily. Denies CP, SOB, falls Consistent with vitamin K intake No unusual bleeding or bruising.   Review of Systems  Respiratory: Negative for shortness of breath.   Cardiovascular: Negative for chest pain.  Gastrointestinal: Negative for anal bleeding and blood in stool.  Genitourinary: Negative for hematuria.  Hematological: Bruises/bleeds easily.       Objective:   Physical Exam  Constitutional: He is oriented to person, place, and time. He appears well-developed and well-nourished.  HENT:  Right Ear: External ear normal.  Left Ear: External ear normal.  Cardiovascular: Normal rate, regular rhythm and normal heart sounds.  Pulmonary/Chest: Effort normal and breath sounds normal.  Neurological: He is alert and oriented to person, place, and time.  Skin: Skin is warm and dry.  Psychiatric: He has a normal mood and affect. His behavior is normal. Thought content normal.  Vitals reviewed.  BP: 116/78  HR: 48  Wt: 242 INR 1.7 Average BG: 125mg /dl No hypoglycemia        Assessment & Plan:  1.  INR below goal 2.5-3.5 due to incorrect dosing. 2.  Coumadin 17.5mg  daily except 15mg  on Sundays, Tuesdays, and Thursdays 3.  RTC 2 weeks

## 2018-01-05 ENCOUNTER — Other Ambulatory Visit: Payer: Self-pay | Admitting: Urology

## 2018-01-05 ENCOUNTER — Telehealth: Payer: Self-pay | Admitting: Radiation Oncology

## 2018-01-05 ENCOUNTER — Ambulatory Visit
Admission: RE | Admit: 2018-01-05 | Discharge: 2018-01-05 | Disposition: A | Discharge status: Home / Self Care | Payer: PPO | Source: Ambulatory Visit | Attending: Radiation Oncology | Admitting: Radiation Oncology

## 2018-01-05 DIAGNOSIS — C61 Malignant neoplasm of prostate: Secondary | ICD-10-CM | POA: Diagnosis not present

## 2018-01-05 DIAGNOSIS — R300 Dysuria: Secondary | ICD-10-CM

## 2018-01-05 DIAGNOSIS — R310 Gross hematuria: Secondary | ICD-10-CM

## 2018-01-05 LAB — URINALYSIS, COMPLETE (UACMP) WITH MICROSCOPIC
Bacteria, UA: NONE SEEN
Bilirubin Urine: NEGATIVE
Glucose, UA: NEGATIVE mg/dL
Ketones, ur: NEGATIVE mg/dL
Leukocytes, UA: NEGATIVE
Nitrite: NEGATIVE
PH: 5 (ref 5.0–8.0)
Protein, ur: NEGATIVE mg/dL
SPECIFIC GRAVITY, URINE: 1.012 (ref 1.005–1.030)

## 2018-01-05 MED ORDER — TAMSULOSIN HCL 0.4 MG PO CAPS: 0.4000 mg | ORAL_CAPSULE | Freq: Every day | ORAL | 5 refills | Status: DC

## 2018-01-05 NOTE — Telephone Encounter (Signed)
Phoned patient's home. Spoke with patient's wife. Explained flomax has been electronically prescribed to their pharmacy of choice. Explained the directions for use of flomax. Also, requested patient present to [facility] lab following radiation treatment to provide a urine sample for UA and culture to rule out infection. She verbalized understanding. Lab appointment entered.

## 2018-01-05 NOTE — Telephone Encounter (Signed)
Understand from the therapist that the patient phoned concerned about side effects. Phoned patient to inquire. Spoke with patient and his wife over speaker phone. Patient reports that beginning yesterday he began experiencing difficulty emptying his bladder, dysuria, and hematuria. Reports the hematuria is scant and bright red. Denies having to strain to emptying his bladder but that its difficult because his stream is weaker and burning. Reports nocturia x 3. Reports urinary urgency. Denies urinary leakage or incontinence. Denies suprapubic pain. Patient scheduled for radiation treatment today at 3pm. Patient uses [facility]  on [address]. Denies taking or ever being prescribed Flomax. Patient understands this RN will discuss these findings with the provider and phone him back with further directions.

## 2018-01-05 NOTE — Telephone Encounter (Signed)
Let's check a urinalysis and C&S today, when he comes for treatment, to rule out UTI.  I will send Rx for Flomax to his pharmacy to begin today as well to see if this helps with bladder emptying. -Isis Costanza

## 2018-01-06 ENCOUNTER — Ambulatory Visit
Admission: RE | Admit: 2018-01-06 | Discharge: 2018-01-06 | Disposition: A | Discharge status: Home / Self Care | Payer: PPO | Source: Ambulatory Visit | Attending: Radiation Oncology | Admitting: Radiation Oncology

## 2018-01-06 DIAGNOSIS — C61 Malignant neoplasm of prostate: Secondary | ICD-10-CM | POA: Diagnosis not present

## 2018-01-06 LAB — URINE CULTURE: Culture: NO GROWTH

## 2018-01-07 ENCOUNTER — Ambulatory Visit
Admission: RE | Admit: 2018-01-07 | Discharge: 2018-01-07 | Disposition: A | Discharge status: Home / Self Care | Payer: PPO | Source: Ambulatory Visit | Attending: Radiation Oncology | Admitting: Radiation Oncology

## 2018-01-07 DIAGNOSIS — C61 Malignant neoplasm of prostate: Secondary | ICD-10-CM | POA: Diagnosis not present

## 2018-01-08 ENCOUNTER — Telehealth: Payer: Self-pay | Admitting: Radiation Oncology

## 2018-01-08 ENCOUNTER — Ambulatory Visit: Payer: PPO

## 2018-01-08 NOTE — Telephone Encounter (Signed)
Phoned patient's home. Spoke with patient's wife. Explained her husband's UA and urine culture results were negative for an infection. She verbalized understanding. She confirms the flomax is helping a lot and dysuria has resolved. Patient missed treatment appointment today due to weather per his wife.

## 2018-01-11 ENCOUNTER — Ambulatory Visit
Admission: RE | Admit: 2018-01-11 | Discharge: 2018-01-11 | Disposition: A | Discharge status: Home / Self Care | Payer: PPO | Source: Ambulatory Visit | Attending: Radiation Oncology | Admitting: Radiation Oncology

## 2018-01-11 DIAGNOSIS — C61 Malignant neoplasm of prostate: Secondary | ICD-10-CM | POA: Diagnosis not present

## 2018-01-12 ENCOUNTER — Encounter: Payer: Self-pay | Admitting: *Deleted

## 2018-01-12 ENCOUNTER — Ambulatory Visit
Admission: RE | Admit: 2018-01-12 | Discharge: 2018-01-12 | Disposition: A | Discharge status: Home / Self Care | Payer: PPO | Source: Ambulatory Visit | Attending: Radiation Oncology | Admitting: Radiation Oncology

## 2018-01-12 DIAGNOSIS — C61 Malignant neoplasm of prostate: Secondary | ICD-10-CM | POA: Diagnosis not present

## 2018-01-13 ENCOUNTER — Ambulatory Visit
Admission: RE | Admit: 2018-01-13 | Discharge: 2018-01-13 | Disposition: A | Discharge status: Home / Self Care | Payer: PPO | Source: Ambulatory Visit | Attending: Radiation Oncology | Admitting: Radiation Oncology

## 2018-01-13 DIAGNOSIS — C61 Malignant neoplasm of prostate: Secondary | ICD-10-CM | POA: Diagnosis not present

## 2018-01-14 ENCOUNTER — Ambulatory Visit
Admission: RE | Admit: 2018-01-14 | Discharge: 2018-01-14 | Disposition: A | Discharge status: Home / Self Care | Payer: PPO | Source: Ambulatory Visit | Attending: Radiation Oncology | Admitting: Radiation Oncology

## 2018-01-14 DIAGNOSIS — C61 Malignant neoplasm of prostate: Secondary | ICD-10-CM | POA: Diagnosis not present

## 2018-01-15 ENCOUNTER — Ambulatory Visit
Admission: RE | Admit: 2018-01-15 | Discharge: 2018-01-15 | Disposition: A | Discharge status: Home / Self Care | Payer: PPO | Source: Ambulatory Visit | Attending: Radiation Oncology | Admitting: Radiation Oncology

## 2018-01-15 DIAGNOSIS — C61 Malignant neoplasm of prostate: Secondary | ICD-10-CM | POA: Diagnosis not present

## 2018-01-18 ENCOUNTER — Ambulatory Visit: Payer: PPO

## 2018-01-18 ENCOUNTER — Ambulatory Visit (INDEPENDENT_AMBULATORY_CARE_PROVIDER_SITE_OTHER): Payer: PPO | Admitting: Pharmacotherapy

## 2018-01-18 ENCOUNTER — Encounter: Payer: Self-pay | Admitting: Pharmacotherapy

## 2018-01-18 DIAGNOSIS — C61 Malignant neoplasm of prostate: Secondary | ICD-10-CM | POA: Diagnosis not present

## 2018-01-18 DIAGNOSIS — Z7901 Long term (current) use of anticoagulants: Secondary | ICD-10-CM | POA: Diagnosis not present

## 2018-01-18 DIAGNOSIS — D6859 Other primary thrombophilia: Secondary | ICD-10-CM

## 2018-01-18 LAB — POCT INR: INR: 2.4

## 2018-01-18 NOTE — Patient Instructions (Signed)
INR 2.4  Continue Coumadin 17.5mg  daily except 15mg  on Sundays, Tuesdays, and Thursdays.

## 2018-01-18 NOTE — Progress Notes (Signed)
   Subjective:    Patient ID: Colton Miranda, male    DOB: Sep 29, 1940, 77 y.o.   MRN: [id number]  HPI Last INR on 01/04/18 was low at 1.7 Coumadin was increased to 17.5mg  daily except 15mg  Su/T/Th. Denies missed doses Denies unusual bleeding or bruising Denies CP, falls Consistent with vitamin K intake.   Review of Systems  HENT: Negative for nosebleeds.   Cardiovascular: Negative for chest pain.  Gastrointestinal: Positive for anal bleeding (had hard BM) and blood in stool.  Genitourinary: Negative for hematuria.  Hematological: Does not bruise/bleed easily.       Objective:   Physical Exam  Constitutional: He is oriented to person, place, and time. He appears well-developed and well-nourished.  HENT:  Right Ear: External ear normal.  Left Ear: External ear normal.  Cardiovascular: Normal rate and regular rhythm.  Pulmonary/Chest: Effort normal and breath sounds normal.  Neurological: He is alert and oriented to person, place, and time.  Vitals reviewed.  BP: 130/76  HR: 44  Wt: 242lb INR 2.4  Average BG: 114mg /dl No hypoglycemia        Assessment & Plan:  1.  INR at goal 2-3 2.  Continue Coumadin 17.5mg  daily except 15mg  Su/T/Th 3.  INR in 1 month

## 2018-01-19 ENCOUNTER — Telehealth: Payer: Self-pay | Admitting: Radiation Oncology

## 2018-01-19 ENCOUNTER — Ambulatory Visit: Payer: PPO

## 2018-01-19 DIAGNOSIS — C61 Malignant neoplasm of prostate: Secondary | ICD-10-CM | POA: Diagnosis not present

## 2018-01-19 NOTE — Telephone Encounter (Signed)
-----   Message from Freeman Caldron, [place] sent at 01/19/2018  3:51 PM EDT ----- Regarding: RE: Flomax Sam, Please let Mr. Ghrist know that he can take 2 Flomax capsules in the evening after supper.  Have him report back to you later this week regarding improvement and if this helps, I can send another Rx to the pharmacy with instructions to take 2 so that he will not run out of his medications prior to time for refill. Thank you!!!! -Ashlyn ----- Message ----- From: Heywood Footman, RN Sent: 01/19/2018   2:53 PM To: Freeman Caldron, PA-C Subject: Flomax                                         Ashlyn.  Mr. Sovine wife wants to know if he can take his Flomax more often. He has received 25 radiation treatments to his prostate. You gave him Flomax on 4/16 to aid in the management of dysuria and nocturia. Originally the Flomax helped greatly but the last three nights he has gotten up four times to void instead of two. He denies dysuria, hematuria, leakage or diarrhea. Reports urgency is unchanged.   Sam

## 2018-01-19 NOTE — Telephone Encounter (Signed)
Per Freeman Caldron, PA-C I phoned the patient instructing him to take two Flomax tablets after dinner. Asked they report back later in the week to inform this RN if the increased dose helped to reduce his nocturia. They understand that Ashlyn plans to send in another script to cover the increased frequency. Patient and wife verbalized understanding of all reviewed.

## 2018-01-20 ENCOUNTER — Ambulatory Visit
Admission: RE | Admit: 2018-01-20 | Discharge: 2018-01-20 | Disposition: A | Discharge status: Home / Self Care | Payer: PPO | Source: Ambulatory Visit | Attending: Radiation Oncology | Admitting: Radiation Oncology

## 2018-01-20 DIAGNOSIS — C61 Malignant neoplasm of prostate: Secondary | ICD-10-CM | POA: Diagnosis not present

## 2018-01-20 DIAGNOSIS — Z510 Encounter for antineoplastic radiation therapy: Secondary | ICD-10-CM | POA: Diagnosis not present

## 2018-01-21 ENCOUNTER — Ambulatory Visit
Admission: RE | Admit: 2018-01-21 | Discharge: 2018-01-21 | Disposition: A | Discharge status: Home / Self Care | Payer: PPO | Source: Ambulatory Visit | Attending: Radiation Oncology | Admitting: Radiation Oncology

## 2018-01-21 DIAGNOSIS — Z510 Encounter for antineoplastic radiation therapy: Secondary | ICD-10-CM | POA: Diagnosis not present

## 2018-01-21 DIAGNOSIS — C61 Malignant neoplasm of prostate: Secondary | ICD-10-CM | POA: Diagnosis not present

## 2018-01-22 ENCOUNTER — Ambulatory Visit
Admission: RE | Admit: 2018-01-22 | Discharge: 2018-01-22 | Disposition: A | Discharge status: Home / Self Care | Payer: PPO | Source: Ambulatory Visit | Attending: Radiation Oncology | Admitting: Radiation Oncology

## 2018-01-22 ENCOUNTER — Encounter: Payer: Self-pay | Admitting: Medical Oncology

## 2018-01-22 DIAGNOSIS — Z510 Encounter for antineoplastic radiation therapy: Secondary | ICD-10-CM | POA: Diagnosis not present

## 2018-01-22 DIAGNOSIS — C61 Malignant neoplasm of prostate: Secondary | ICD-10-CM | POA: Diagnosis not present

## 2018-01-25 ENCOUNTER — Other Ambulatory Visit: Payer: Self-pay | Admitting: Nurse Practitioner

## 2018-01-25 ENCOUNTER — Ambulatory Visit
Admission: RE | Admit: 2018-01-25 | Discharge: 2018-01-25 | Disposition: A | Discharge status: Home / Self Care | Payer: PPO | Source: Ambulatory Visit | Attending: Radiation Oncology | Admitting: Radiation Oncology

## 2018-01-25 DIAGNOSIS — C61 Malignant neoplasm of prostate: Secondary | ICD-10-CM | POA: Diagnosis not present

## 2018-01-25 DIAGNOSIS — Z510 Encounter for antineoplastic radiation therapy: Secondary | ICD-10-CM | POA: Diagnosis not present

## 2018-01-26 ENCOUNTER — Ambulatory Visit
Admission: RE | Admit: 2018-01-26 | Discharge: 2018-01-26 | Disposition: A | Discharge status: Home / Self Care | Payer: PPO | Source: Ambulatory Visit | Attending: Radiation Oncology | Admitting: Radiation Oncology

## 2018-01-26 DIAGNOSIS — C61 Malignant neoplasm of prostate: Secondary | ICD-10-CM | POA: Diagnosis not present

## 2018-01-26 DIAGNOSIS — Z510 Encounter for antineoplastic radiation therapy: Secondary | ICD-10-CM | POA: Diagnosis not present

## 2018-01-27 ENCOUNTER — Ambulatory Visit
Admission: RE | Admit: 2018-01-27 | Discharge: 2018-01-27 | Disposition: A | Discharge status: Home / Self Care | Payer: PPO | Source: Ambulatory Visit | Attending: Radiation Oncology | Admitting: Radiation Oncology

## 2018-01-27 DIAGNOSIS — Z510 Encounter for antineoplastic radiation therapy: Secondary | ICD-10-CM | POA: Diagnosis not present

## 2018-01-27 DIAGNOSIS — C61 Malignant neoplasm of prostate: Secondary | ICD-10-CM | POA: Diagnosis not present

## 2018-01-28 ENCOUNTER — Ambulatory Visit
Admission: RE | Admit: 2018-01-28 | Discharge: 2018-01-28 | Disposition: A | Discharge status: Home / Self Care | Payer: PPO | Source: Ambulatory Visit | Attending: Radiation Oncology | Admitting: Radiation Oncology

## 2018-01-28 DIAGNOSIS — Z510 Encounter for antineoplastic radiation therapy: Secondary | ICD-10-CM | POA: Diagnosis not present

## 2018-01-28 DIAGNOSIS — C61 Malignant neoplasm of prostate: Secondary | ICD-10-CM | POA: Diagnosis not present

## 2018-01-29 ENCOUNTER — Ambulatory Visit: Payer: PPO

## 2018-02-01 ENCOUNTER — Ambulatory Visit
Admission: RE | Admit: 2018-02-01 | Discharge: 2018-02-01 | Disposition: A | Discharge status: Home / Self Care | Payer: PPO | Source: Ambulatory Visit | Attending: Radiation Oncology | Admitting: Radiation Oncology

## 2018-02-01 DIAGNOSIS — C61 Malignant neoplasm of prostate: Secondary | ICD-10-CM | POA: Diagnosis not present

## 2018-02-01 DIAGNOSIS — Z510 Encounter for antineoplastic radiation therapy: Secondary | ICD-10-CM | POA: Diagnosis not present

## 2018-02-02 ENCOUNTER — Ambulatory Visit
Admission: RE | Admit: 2018-02-02 | Discharge: 2018-02-02 | Disposition: A | Discharge status: Home / Self Care | Payer: PPO | Source: Ambulatory Visit | Attending: Radiation Oncology | Admitting: Radiation Oncology

## 2018-02-02 DIAGNOSIS — C61 Malignant neoplasm of prostate: Secondary | ICD-10-CM | POA: Diagnosis not present

## 2018-02-02 DIAGNOSIS — Z510 Encounter for antineoplastic radiation therapy: Secondary | ICD-10-CM | POA: Diagnosis not present

## 2018-02-03 ENCOUNTER — Ambulatory Visit
Admission: RE | Admit: 2018-02-03 | Discharge: 2018-02-03 | Disposition: A | Discharge status: Home / Self Care | Payer: PPO | Source: Ambulatory Visit | Attending: Radiation Oncology | Admitting: Radiation Oncology

## 2018-02-03 DIAGNOSIS — C61 Malignant neoplasm of prostate: Secondary | ICD-10-CM | POA: Diagnosis not present

## 2018-02-03 DIAGNOSIS — Z510 Encounter for antineoplastic radiation therapy: Secondary | ICD-10-CM | POA: Diagnosis not present

## 2018-02-04 ENCOUNTER — Ambulatory Visit
Admission: RE | Admit: 2018-02-04 | Discharge: 2018-02-04 | Disposition: A | Discharge status: Home / Self Care | Payer: PPO | Source: Ambulatory Visit | Attending: Radiation Oncology | Admitting: Radiation Oncology

## 2018-02-04 DIAGNOSIS — C61 Malignant neoplasm of prostate: Secondary | ICD-10-CM | POA: Diagnosis not present

## 2018-02-04 DIAGNOSIS — Z510 Encounter for antineoplastic radiation therapy: Secondary | ICD-10-CM | POA: Diagnosis not present

## 2018-02-05 ENCOUNTER — Ambulatory Visit
Admission: RE | Admit: 2018-02-05 | Discharge: 2018-02-05 | Disposition: A | Discharge status: Home / Self Care | Payer: PPO | Source: Ambulatory Visit | Attending: Radiation Oncology | Admitting: Radiation Oncology

## 2018-02-05 ENCOUNTER — Ambulatory Visit: Payer: PPO

## 2018-02-05 DIAGNOSIS — C61 Malignant neoplasm of prostate: Secondary | ICD-10-CM | POA: Diagnosis not present

## 2018-02-05 DIAGNOSIS — Z510 Encounter for antineoplastic radiation therapy: Secondary | ICD-10-CM | POA: Diagnosis not present

## 2018-02-08 ENCOUNTER — Ambulatory Visit: Payer: PPO

## 2018-02-08 ENCOUNTER — Ambulatory Visit
Admission: RE | Admit: 2018-02-08 | Discharge: 2018-02-08 | Disposition: A | Discharge status: Home / Self Care | Payer: PPO | Source: Ambulatory Visit | Attending: Radiation Oncology | Admitting: Radiation Oncology

## 2018-02-08 DIAGNOSIS — C61 Malignant neoplasm of prostate: Secondary | ICD-10-CM | POA: Diagnosis not present

## 2018-02-08 DIAGNOSIS — Z510 Encounter for antineoplastic radiation therapy: Secondary | ICD-10-CM | POA: Diagnosis not present

## 2018-02-09 ENCOUNTER — Ambulatory Visit
Admission: RE | Admit: 2018-02-09 | Discharge: 2018-02-09 | Disposition: A | Discharge status: Home / Self Care | Payer: PPO | Source: Ambulatory Visit | Attending: Radiation Oncology | Admitting: Radiation Oncology

## 2018-02-09 DIAGNOSIS — C61 Malignant neoplasm of prostate: Secondary | ICD-10-CM | POA: Diagnosis not present

## 2018-02-09 DIAGNOSIS — Z510 Encounter for antineoplastic radiation therapy: Secondary | ICD-10-CM | POA: Diagnosis not present

## 2018-02-11 ENCOUNTER — Encounter: Payer: Self-pay | Admitting: Radiation Oncology

## 2018-02-11 DIAGNOSIS — D225 Melanocytic nevi of trunk: Secondary | ICD-10-CM | POA: Diagnosis not present

## 2018-02-11 DIAGNOSIS — Z85828 Personal history of other malignant neoplasm of skin: Secondary | ICD-10-CM | POA: Diagnosis not present

## 2018-02-11 DIAGNOSIS — D2372 Other benign neoplasm of skin of left lower limb, including hip: Secondary | ICD-10-CM | POA: Diagnosis not present

## 2018-02-11 DIAGNOSIS — D692 Other nonthrombocytopenic purpura: Secondary | ICD-10-CM | POA: Diagnosis not present

## 2018-02-11 DIAGNOSIS — L821 Other seborrheic keratosis: Secondary | ICD-10-CM | POA: Diagnosis not present

## 2018-02-11 DIAGNOSIS — L218 Other seborrheic dermatitis: Secondary | ICD-10-CM | POA: Diagnosis not present

## 2018-02-11 NOTE — Progress Notes (Signed)
  Radiation Oncology         [phone] ________________________________  Name: Colton Miranda MRN: [id number]  Date: 02/11/2018  DOB: 1940/12/15  End of Treatment Note  Diagnosis:  77 y.o. gentleman with Stage T1c adenocarcinoma of the prostate with Gleason Score of 4+4, and PSA of 14.1    Indication for treatment: Curative       Radiation treatment dates:  12/14/17-02/09/18  Site/dose: 1) Prostate/ 45 Gy in 25 fractions 2) Prostate boost/ 30 Gy in 15 fractions  Beams/energy: 1) IMRT/ 6X 2) IMRT/ 6X  Narrative: The patient tolerated radiation treatment relatively well. During treatment the patient complained of nocturia x 3-4 as well as occasional dysuria and urgency.   Plan: The patient has completed radiation treatment. The patient will return to radiation oncology clinic for routine followup in one month. I advised him to call or return sooner if he has any questions or concerns related to his recovery or treatment. ________________________________  Sheral Apley. Tammi Klippel, M.D.   This document serves as a record of services personally performed by Tyler Pita, MD. It was created on his behalf by Bethann Humble, a trained medical scribe. The creation of this record is based on the scribe's personal observations and the provider's statements to them. This document has been checked and approved by the attending provider.

## 2018-02-17 ENCOUNTER — Encounter: Payer: Self-pay | Admitting: Nurse Practitioner

## 2018-02-17 ENCOUNTER — Ambulatory Visit (INDEPENDENT_AMBULATORY_CARE_PROVIDER_SITE_OTHER): Payer: PPO | Admitting: Nurse Practitioner

## 2018-02-17 VITALS — BP 126/72 | HR 73 | Temp 98.4°F | Ht 65.0 in | Wt 239.0 lb

## 2018-02-17 DIAGNOSIS — D6859 Other primary thrombophilia: Secondary | ICD-10-CM | POA: Diagnosis not present

## 2018-02-17 DIAGNOSIS — Z7901 Long term (current) use of anticoagulants: Secondary | ICD-10-CM | POA: Diagnosis not present

## 2018-02-17 LAB — POCT INR: INR: 1.6 — AB (ref 2.0–3.0)

## 2018-02-17 MED ORDER — NYSTATIN 100000 UNIT/GM EX POWD: 100000.0000 g | Freq: Every day | CUTANEOUS | 0 refills | Status: DC | PRN

## 2018-02-17 NOTE — Patient Instructions (Signed)
INR low at 1.6 but you missed a dose, therefore we will not change anything today but recheck your INR in 1 week.

## 2018-02-17 NOTE — Progress Notes (Signed)
Careteam: Patient Care Team: Lauree Chandler, NP as PCP - General (Geriatric Medicine) [name], MD as Consulting Physician (Orthopedic Surgery) [name], MD as Consulting Physician (Cardiology)  Advanced Directive information Does Patient Have a Medical Advance Directive?: Yes, Type of Advance Directive: Healthcare Power of Attorney  No Known Allergies  Chief Complaint  Patient presents with  . Medical Management of Chronic Issues    Pt is being seen for an INR check. Current INR is 1.6. Previous was 2.4 on 01/18/18     HPI: Patient is a 77 y.o. male seen in the office today for INR check.  Coumadin was increased to 17.5 mg daily except 15 mg Sunday/t/thur on 4/15. Last INR was 2.4 on 01/18/18 and therefore continued on same dose. Today INR 1.6 but he missed dosed on 3 days ago.  No changes in diet. No bleeding or bruising. No shortness of breath or chest pain.   Review of Systems:  Review of Systems  Constitutional: Negative for chills, diaphoresis and fever.  Respiratory: Negative for hemoptysis.   Cardiovascular: Negative for chest pain and leg swelling.  Gastrointestinal: Negative for blood in stool.  Genitourinary: Negative for hematuria.  Skin: Negative for itching and rash.  Neurological: Negative for dizziness and headaches.  Endo/Heme/Allergies: Bruises/bleeds easily.    Past Medical History:  Diagnosis Date  . BPH associated with nocturia   . Chronic constipation   . COPD (chronic obstructive pulmonary disease) ([place])   . History of adenomatous polyp of colon   . History of basal cell carcinoma (BCC) of skin    post excision from trunk  . 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 20 years ago , 1999, caused by blood clot after knee scope"  DVT to PE  . History of DVT (deep vein thrombosis)    11-06-2017 per pt and wife happened lower leg approx. 1999 after knee scope  .  History of pulmonary embolus (PE)    11-06-2017 from DVT in approx. 1999 per wife and pt  . Hyperlipidemia   . Hypertension    followed by pcp  . Long term (current) use of anticoagulants Coumadin-- [facility]as[facility] w/ [facility]   secondary to primary hypercoagulopathy w/ hx DVT and PE  . Myalgia and myositis, unspecified   . OA (osteoarthritis)    "all joints"  . Pinched nerve in neck    per pt causes intermittant numbness bilateral arms and hands  . Primary hypercoagulable state ([place])   . Prostate cancer [facility]) urologist-  dr Alyson Ingles  oncologist-- dr Tammi Klippel   dx 08-28-2017  Stage T1c, Gleason 4+4, PSA 14.1, vol 42.8cc-- planned treatment external radiation and ADT  . Right bundle branch block   . Seborrheic dermatitis, unspecified   . Type 2 diabetes mellitus ([place])   . Urinary frequency   . Wears glasses    Past Surgical History:  Procedure Laterality Date  . CHOLECYSTECTOMY OPEN  1981  . COLONOSCOPY  last one 01-28-2017  . GOLD SEED IMPLANT N/A 11/16/2017   Procedure: GOLD SEED IMPLANT;  Surgeon: Cleon Gustin, MD;  [facility];  Service: Urology;  Laterality: N/A;  . KNEE ARTHROSCOPY Right 1990s  . PILONIDAL CYST EXCISION  1968  . PROSTATE BIOPSY  09/10/2017    (done in office)   + Pos for Prostate Cancer, Dr.MCkinzie( [facility])   . SPACE OAR INSTILLATION N/A  11/16/2017   Procedure: SPACE OAR INSTILLATION;  Surgeon: Cleon Gustin, MD;  Location: [facility];  Service: Urology;  Laterality: N/A;  . TOTAL HIP ARTHROPLASTY Right 06-18-2009   dr Wynelle Link  [facility]   Social History:   reports that he quit smoking about 9 years ago. His smoking use included cigarettes. He quit after 50.00 years of use. He has never used smokeless tobacco. He reports that he drinks about 1.8 oz of alcohol per week. He reports that he does not use drugs.  Family History  Problem Relation Age of Onset  . Cancer Mother 60        colon , breast  . Colon cancer Mother   . Heart attack Daughter        2008  . Parkinson's disease Sister   . Cancer Sister        breast  . Cancer Maternal Uncle        melanoma  . Cancer Paternal Uncle     Medications: Patient's Medications  New Prescriptions   No medications on file  Previous Medications   BIOFLAVONOID PRODUCTS (BIOFLEX) TABS    Take 1 tablet by mouth every morning.   BLOOD GLUCOSE MONITORING SUPPL (ONETOUCH VERIO) W/DEVICE KIT    by Does not apply route. Check blood sugar twice daily as directed DX E11.65   CHOLECALCIFEROL (VITAMIN D) 2000 UNITS CAPS    Take 1 capsule by mouth daily.     DOCUSATE SODIUM (COLACE) 100 MG CAPSULE    Take 100 mg by mouth at bedtime.   GLUCOSE BLOOD (ONETOUCH VERIO) TEST STRIP    Check blood sugar twice daily as directed E11.65   LEUPROLIDE ACETATE, 3 MONTH, (LUPRON DEPOT, 7-MONTH, IM)    Give injection based on recommendations of Urologist.   LISINOPRIL (PRINIVIL,ZESTRIL) 20 MG TABLET    Take 10 mg by mouth daily.   MELATONIN 5 MG CAPS    Take 1 capsule (5 mg total) by mouth at bedtime.   METFORMIN (GLUCOPHAGE) 500 MG TABLET    TAKE 1 TABLET BY MOUTH ONCE DAILY WITH BREAKFAST   NYSTATIN (MYCOSTATIN/NYSTOP) POWDER    Apply 100,000 g topically daily as needed. Apply to groin area for yeast/fungal infection   ONETOUCH DELICA LANCETS 60A MISC    1 each by Does not apply route 3 (three) times daily. Check blood sugar twice daily E11.65   SIMVASTATIN (ZOCOR) 20 MG TABLET    TAKE 1 TABLET BY MOUTH ONCE DAILY FOR CHOLESTEROL   TAMSULOSIN (FLOMAX) 0.4 MG CAPS CAPSULE    Take 1 capsule (0.4 mg total) by mouth daily after supper.   TRAMADOL (ULTRAM) 50 MG TABLET    Take 1 tablet (50 mg total) by mouth every 6 (six) hours as needed.   TRIAMCINOLONE LOTION (KENALOG) 0.1 %    as needed. Use once daily as needed for skin irritation behind left ear    WARFARIN (COUMADIN) 10 MG TABLET    TAKE 1 TABLET BY MOUTH ONCE DAILY ALONG  WITH  A  5  MG  TABLET   FOR  A  TOTAL  OF  15  MG  DAILY   WARFARIN (COUMADIN) 5 MG TABLET    Take 5 mg along with 10 mg for a total of 50m daily except 7.553mwith 1024mn Mondays, Wednesday and Fridays and Saturdays for a total of 17.5mg44modified Medications   No medications on file  Discontinued Medications   No medications on file  Physical Exam:  Vitals:   02/17/18 0943  BP: 126/72  Pulse: 73  Temp: 98.4 F (36.9 C)  TempSrc: Oral  SpO2: 96%  Weight: 239 lb (108.4 kg)  Height: 5' 5"  (1.651 m)   Body mass index is 39.77 kg/m.  Physical Exam  Constitutional: He is oriented to person, place, and time. He appears well-developed and well-nourished.  HENT:  Head: Normocephalic and atraumatic.  Cardiovascular: Normal rate and regular rhythm.  Pulmonary/Chest: Effort normal and breath sounds normal.  Neurological: He is alert and oriented to person, place, and time.    Labs reviewed: Basic Metabolic Panel: Recent Labs    06/11/17 0900 11/16/17 1438  NA 137 139  K 4.4 4.0  CL 103 102  CO2 24  --   GLUCOSE 106* 115*  BUN 20 20  CREATININE 1.16 0.90  CALCIUM 9.5  --    Liver Function Tests: Recent Labs    06/11/17 0900 12/02/17 1352  AST 20 28  ALT 21 45  BILITOT 0.7 0.4  PROT 7.0 6.5   No results for input(s): LIPASE, AMYLASE in the last 8760 hours. No results for input(s): AMMONIA in the last 8760 hours. CBC: Recent Labs    06/11/17 0900 11/16/17 1438  WBC 7.4  --   NEUTROABS 4,144  --   HGB 14.0 13.3  HCT 42.6 39.0  MCV 84.2  --   PLT 231  --    Lipid Panel: Recent Labs    06/11/17 0900  CHOL 111  HDL 47  LDLCALC 49  TRIG 70  CHOLHDL 2.4   TSH: No results for input(s): TSH in the last 8760 hours. A1C: Lab Results  Component Value Date   HGBA1C 6.7 (H) 12/02/2017     Assessment/Plan 1. Long term current use of anticoagulant therapy 2. Primary hypercoagulable state ([place]) - POC INR low at 1.6 however missed coumadin dose 3 days ago.  -Continue  Coumadin 17.4m daily except 129mon Sundays, Tuesdays, and Thursday -will continue Coumadin 17.52m31maily except 152m28m Sundays, Tuesdays, and Thursday and recheck INR in 1 week   Aws Shere K. [facility]urnN[facility]xford[facility] Medicine [phone]

## 2018-02-19 DIAGNOSIS — Z5111 Encounter for antineoplastic chemotherapy: Secondary | ICD-10-CM | POA: Diagnosis not present

## 2018-02-19 DIAGNOSIS — C61 Malignant neoplasm of prostate: Secondary | ICD-10-CM | POA: Diagnosis not present

## 2018-02-22 ENCOUNTER — Encounter: Payer: Self-pay | Admitting: *Deleted

## 2018-02-22 NOTE — Progress Notes (Signed)
[place] [employer]  [employer] representative received referral from radiation oncology for financial concerns.  CSW contacted patients wife at home to offer support and assess for needs.  Patients wife stated they have started receiving bills for patients radiation treatment.  Both the patient and his wife are on a "fixed income".  Increased medical bills have caused increased stress and anxiety.  Patients wife stated she contacted the billing department to discuss setting up a payment plan, but did not have a good experience.  CSW encouraged patients wife to contact [place] financial advocate to discuss billing and payment options; as they may have more knowledge of co-pay assistance and insurance resources.  CSW left a message with financial advocate informing of patient situation.  Patients wife plans to contact financial advocate tomorrow.  CSW provided contact information and encouraged patient and wife to call with questions or concerns.   Johnnye Lana, MSW, LCSW, OSW-C Clinical Social Worker [facility] [phone]

## 2018-03-02 ENCOUNTER — Other Ambulatory Visit: Payer: Self-pay | Admitting: Pharmacotherapy

## 2018-03-02 ENCOUNTER — Other Ambulatory Visit: Payer: Self-pay | Admitting: Nurse Practitioner

## 2018-03-02 DIAGNOSIS — E785 Hyperlipidemia, unspecified: Secondary | ICD-10-CM

## 2018-03-02 DIAGNOSIS — E119 Type 2 diabetes mellitus without complications: Secondary | ICD-10-CM

## 2018-03-04 ENCOUNTER — Encounter: Payer: Self-pay | Admitting: Nurse Practitioner

## 2018-03-04 ENCOUNTER — Ambulatory Visit (INDEPENDENT_AMBULATORY_CARE_PROVIDER_SITE_OTHER): Payer: PPO | Admitting: Nurse Practitioner

## 2018-03-04 VITALS — BP 122/78 | HR 65 | Temp 97.9°F | Ht 65.0 in | Wt 242.0 lb

## 2018-03-04 DIAGNOSIS — Z7901 Long term (current) use of anticoagulants: Secondary | ICD-10-CM | POA: Diagnosis not present

## 2018-03-04 DIAGNOSIS — D6859 Other primary thrombophilia: Secondary | ICD-10-CM

## 2018-03-04 LAB — POCT INR: INR: 2.1 (ref 2.0–3.0)

## 2018-03-04 NOTE — Patient Instructions (Signed)
Cont current coumadin And will get INR at next appt with Dr Eulas Post

## 2018-03-04 NOTE — Progress Notes (Signed)
Careteam: Patient Care Team: Lauree Chandler, NP as PCP - General (Geriatric Medicine) [name], MD as Consulting Physician (Orthopedic Surgery) [name], MD as Consulting Physician (Cardiology)  Advanced Directive information Does Patient Have a Medical Advance Directive?: Yes, Type of Advance Directive: Healthcare Power of Attorney  No Known Allergies  Chief Complaint  Patient presents with  . Medical Management of Chronic Issues    Pt is being seen for an INR check. Current INR is 2.1. Previous is 1.6.      HPI: Patient is a 77 y.o. male seen in the office today for INR check for hypercoagulable state with recurrent PE/DVT. Goal INR 2-3Pt with INR of 2.1 today. Previous INR 1.6 however he had missed a dose. Pt currently on coumadin 17.5 mg daily except 15 mg on Sunday Tuesday and Thursday.  No changes in diet. No bleeding or bruising. No shortness of breath or chest pain. No leg swelling.  Recent hormone shot and stopped flomax  Been on current dose for about 2 months.   Review of Systems:  Review of Systems  Constitutional: Negative for chills, diaphoresis and fever.  Respiratory: Negative for hemoptysis.   Cardiovascular: Negative for chest pain and leg swelling.  Gastrointestinal: Negative for blood in stool.  Genitourinary: Negative for hematuria.  Skin: Negative for itching and rash.  Neurological: Negative for dizziness and headaches.  Endo/Heme/Allergies: Bruises/bleeds easily.    Past Medical History:  Diagnosis Date  . BPH associated with nocturia   . Chronic constipation   . COPD (chronic obstructive pulmonary disease) ([place])   . History of adenomatous polyp of colon   . History of basal cell carcinoma (BCC) of skin    post excision from trunk  . 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 20 years ago , 1999, caused by blood clot after knee scope"  DVT to PE  . History  of DVT (deep vein thrombosis)    11-06-2017 per pt and wife happened lower leg approx. 1999 after knee scope  . History of pulmonary embolus (PE)    11-06-2017 from DVT in approx. 1999 per wife and pt  . Hyperlipidemia   . Hypertension    followed by pcp  . Long term (current) use of anticoagulants Coumadin-- followed by Pharmasist w/ [facility]   secondary to primary hypercoagulopathy w/ hx DVT and PE  . Myalgia and myositis, unspecified   . OA (osteoarthritis)    "all joints"  . Pinched nerve in neck    per pt causes intermittant numbness bilateral arms and hands  . Primary hypercoagulable state ([place])   . Prostate cancer [facility]) urologist-  dr Alyson Ingles  oncologist-- dr Tammi Klippel   dx 08-28-2017  Stage T1c, Gleason 4+4, PSA 14.1, vol 42.8cc-- planned treatment external radiation and ADT  . Right bundle branch block   . Seborrheic dermatitis, unspecified   . Type 2 diabetes mellitus ([place])   . Urinary frequency   . Wears glasses    Past Surgical History:  Procedure Laterality Date  . CHOLECYSTECTOMY OPEN  1981  . COLONOSCOPY  last one 01-28-2017  . GOLD SEED IMPLANT N/A 11/16/2017   Procedure: GOLD SEED IMPLANT;  Surgeon: Cleon Gustin, MD;  Location: [facility];  Service: Urology;  Laterality: N/A;  . KNEE ARTHROSCOPY Right 1990s  . PILONIDAL CYST EXCISION  1968  . PROSTATE BIOPSY  09/10/2017    (done  in office)   + Pos for Prostate Cancer, Dr.MCkinzie( [facility])   . SPACE OAR INSTILLATION N/A 11/16/2017   Procedure: SPACE OAR INSTILLATION;  Surgeon: Cleon Gustin, MD;  [facility];  Service: Urology;  Laterality: N/A;  . TOTAL HIP ARTHROPLASTY Right 06-18-2009   dr Wynelle Link  [facility]   Social History:   reports that he quit smoking about 9 years ago. His smoking use included cigarettes. He quit after 50.00 years of use. He has never used smokeless tobacco. He reports that he drinks about 1.8 oz of alcohol per week. He  reports that he does not use drugs.  Family History  Problem Relation Age of Onset  . Cancer Mother 70       colon , breast  . Colon cancer Mother   . Heart attack Daughter        2008  . Parkinson's disease Sister   . Cancer Sister        breast  . Cancer Maternal Uncle        melanoma  . Cancer Paternal Uncle     Medications: Patient's Medications  New Prescriptions   No medications on file  Previous Medications   BIOFLAVONOID PRODUCTS (BIOFLEX) TABS    Take 1 tablet by mouth every morning.   BLOOD GLUCOSE MONITORING SUPPL (ONETOUCH VERIO) W/DEVICE KIT    by Does not apply route. Check blood sugar twice daily as directed DX E11.65   CHOLECALCIFEROL (VITAMIN D) 2000 UNITS CAPS    Take 1 capsule by mouth daily.     DOCUSATE SODIUM (COLACE) 100 MG CAPSULE    Take 100 mg by mouth at bedtime.   LEUPROLIDE ACETATE, 3 MONTH, (LUPRON DEPOT, 21-MONTH, IM)    Give injection based on recommendations of Urologist.   LISINOPRIL (PRINIVIL,ZESTRIL) 20 MG TABLET    Take 10 mg by mouth daily.   MELATONIN 5 MG CAPS    Take 1 capsule (5 mg total) by mouth at bedtime.   METFORMIN (GLUCOPHAGE) 500 MG TABLET    TAKE 1 TABLET BY MOUTH ONCE DAILY WITH BREAKFAST   NYSTATIN (MYCOSTATIN/NYSTOP) POWDER    Apply 100,000 g topically daily as needed. Apply to groin area for yeast/fungal infection   ONETOUCH DELICA LANCETS 38B MISC    USE 1  TO CHECK GLUCOSE TWICE DAILY   ONETOUCH VERIO TEST STRIP    USE 1 STRIP TO CHECK GLUCOSE TWICE DAILY AS DIRECTED   SIMVASTATIN (ZOCOR) 20 MG TABLET    TAKE 1 TABLET BY MOUTH ONCE DAILY FOR CHOLESTEROL   TRAMADOL (ULTRAM) 50 MG TABLET    Take 1 tablet (50 mg total) by mouth every 6 (six) hours as needed.   TRIAMCINOLONE LOTION (KENALOG) 0.1 %    as needed. Use once daily as needed for skin irritation behind left ear    WARFARIN (COUMADIN) 10 MG TABLET    TAKE 1 TABLET BY MOUTH ONCE DAILY ALONG  WITH  A  5  MG  TABLET  FOR  A  TOTAL  OF  15  MG  DAILY   WARFARIN (COUMADIN) 5  MG TABLET    Take 5 mg along with 10 mg for a total of 55m daily except 7.559mwith 1018mn Mondays, Wednesday and Fridays and Saturdays for a total of 17.5mg17modified Medications   No medications on file  Discontinued Medications   TAMSULOSIN (FLOMAX) 0.4 MG CAPS CAPSULE    Take 1 capsule (0.4 mg total) by  mouth daily after supper.     Physical Exam:  Vitals:   03/04/18 1526  BP: 122/78  Pulse: 65  Temp: 97.9 F (36.6 C)  TempSrc: Oral  SpO2: 96%  Weight: 242 lb (109.8 kg)  Height: 5' 5"  (1.651 m)   Body mass index is 40.27 kg/m.  Physical Exam  Constitutional: He is oriented to person, place, and time. He appears well-developed and well-nourished.  HENT:  Head: Normocephalic and atraumatic.  Cardiovascular: Normal rate and regular rhythm.  Pulmonary/Chest: Effort normal and breath sounds normal.  Neurological: He is alert and oriented to person, place, and time.    Labs reviewed: Basic Metabolic Panel: Recent Labs    06/11/17 0900 11/16/17 1438  NA 137 139  K 4.4 4.0  CL 103 102  CO2 24  --   GLUCOSE 106* 115*  BUN 20 20  CREATININE 1.16 0.90  CALCIUM 9.5  --    Liver Function Tests: Recent Labs    06/11/17 0900 12/02/17 1352  AST 20 28  ALT 21 45  BILITOT 0.7 0.4  PROT 7.0 6.5   No results for input(s): LIPASE, AMYLASE in the last 8760 hours. No results for input(s): AMMONIA in the last 8760 hours. CBC: Recent Labs    06/11/17 0900 11/16/17 1438  WBC 7.4  --   NEUTROABS 4,144  --   HGB 14.0 13.3  HCT 42.6 39.0  MCV 84.2  --   PLT 231  --    Lipid Panel: Recent Labs    06/11/17 0900  CHOL 111  HDL 47  LDLCALC 49  TRIG 70  CHOLHDL 2.4   TSH: No results for input(s): TSH in the last 8760 hours. A1C: Lab Results  Component Value Date   HGBA1C 6.7 (H) 12/02/2017     Assessment/Plan 1. Long term current use of anticoagulant therapy - POC INR 2.1, goal 2-3 will continue current dose of coumadin   2. Primary hypercoagulable  state ([place]) -pt with hx of DVT, PE on long term coumadin. No signs of recurrence. INR at goal.   Next appt: 03/31/2018- INR with Dr Eulas Post at this time.  Carlos American. [place], [facility] [phone]

## 2018-03-10 ENCOUNTER — Other Ambulatory Visit: Payer: Self-pay | Admitting: Urology

## 2018-03-10 ENCOUNTER — Other Ambulatory Visit: Payer: Self-pay

## 2018-03-10 ENCOUNTER — Ambulatory Visit
Admission: RE | Admit: 2018-03-10 | Discharge: 2018-03-10 | Disposition: A | Discharge status: Home / Self Care | Payer: PPO | Source: Ambulatory Visit | Attending: Urology | Admitting: Urology

## 2018-03-10 ENCOUNTER — Encounter: Payer: Self-pay | Admitting: Urology

## 2018-03-10 VITALS — BP 119/57 | HR 52 | Temp 97.8°F | Resp 17 | Ht 65.0 in | Wt 242.3 lb

## 2018-03-10 DIAGNOSIS — R3915 Urgency of urination: Secondary | ICD-10-CM | POA: Diagnosis not present

## 2018-03-10 DIAGNOSIS — R3916 Straining to void: Secondary | ICD-10-CM | POA: Insufficient documentation

## 2018-03-10 DIAGNOSIS — Z79899 Other long term (current) drug therapy: Secondary | ICD-10-CM | POA: Diagnosis not present

## 2018-03-10 DIAGNOSIS — R351 Nocturia: Secondary | ICD-10-CM | POA: Diagnosis not present

## 2018-03-10 DIAGNOSIS — Z7901 Long term (current) use of anticoagulants: Secondary | ICD-10-CM | POA: Diagnosis not present

## 2018-03-10 DIAGNOSIS — R300 Dysuria: Secondary | ICD-10-CM | POA: Diagnosis not present

## 2018-03-10 DIAGNOSIS — Z7984 Long term (current) use of oral hypoglycemic drugs: Secondary | ICD-10-CM | POA: Diagnosis not present

## 2018-03-10 DIAGNOSIS — C61 Malignant neoplasm of prostate: Secondary | ICD-10-CM | POA: Diagnosis not present

## 2018-03-10 MED ORDER — ALFUZOSIN HCL ER 10 MG PO TB24: 10.0000 mg | ORAL_TABLET | Freq: Every day | ORAL | 3 refills | Status: AC

## 2018-03-10 NOTE — Progress Notes (Signed)
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  He tolerated radiation treatment relatively well. During treatment the patient complained of nocturia x 3-4 as well as occasional dysuria and urgency, managed with Flomax.   On review of systems, the patient states that he is doing well overall.  He continues to struggle with intermittency, weak stream, straining to void and nocturia x4 per night.  He reports a better flow of stream and bladder emptying throughout the day but continues with increased frequency and urgency in the daytime.  He denies dysuria or gross hematuria.  His current IPSS score is 20 indicating severe lower urinary tract symptoms.  He was prescribed Flomax during his treatment and initially had significant improvement but the medication became ineffective after the first week so he has not been taking any medication for LUTS for the past 5-6 weeks.  He reports continued fatigue but denies hot flashes and overall is tolerating the ADT well.  He denies abdominal pain, nausea, vomiting or diarrhea.  He reports a healthy appetite and is maintaining his weight.  ALLERGIES:  has No Known Allergies.  Meds: Current Outpatient Medications  Medication Sig Dispense Refill  . Bioflavonoid Products (BIOFLEX) TABS Take 1 tablet by mouth every morning.    . Blood Glucose Monitoring Suppl (ONETOUCH VERIO) w/Device KIT by Does not apply route. Check blood sugar twice daily as directed DX E11.65      . Cholecalciferol (VITAMIN D) 2000 UNITS CAPS Take 1 capsule by mouth daily.      Marland Kitchen docusate sodium (COLACE) 100 MG capsule Take 100 mg by mouth at bedtime.    Marland Kitchen Leuprolide Acetate, 3 Month, (LUPRON DEPOT, 48-MONTH, IM) Give injection based on recommendations of Urologist.    . lisinopril (PRINIVIL,ZESTRIL) 20 MG tablet Take 10 mg by mouth daily.    . Melatonin 5 MG CAPS Take 1 capsule (5 mg total) by mouth at bedtime.  0  . metFORMIN (GLUCOPHAGE) 500 MG tablet TAKE 1 TABLET BY MOUTH ONCE DAILY WITH BREAKFAST 90 tablet 1  . nystatin (MYCOSTATIN/NYSTOP) powder Apply 100,000 g topically daily as needed. Apply to groin area for yeast/fungal infection 15 g 0  . ONETOUCH DELICA LANCETS 96E MISC USE 1  TO CHECK GLUCOSE TWICE DAILY 100 each 12  . ONETOUCH VERIO test strip USE 1 STRIP TO CHECK GLUCOSE TWICE DAILY AS DIRECTED 100 each 11  . simvastatin (ZOCOR) 20 MG tablet TAKE 1 TABLET BY MOUTH ONCE DAILY FOR CHOLESTEROL 90 tablet 1  . traMADol (ULTRAM) 50 MG tablet Take 1 tablet (50 mg total) by mouth every 6 (six) hours as needed. 30 tablet 0  . warfarin (COUMADIN) 10 MG tablet TAKE 1 TABLET BY MOUTH ONCE DAILY ALONG  WITH  A  5  MG  TABLET  FOR  A  TOTAL  OF  15  MG  DAILY 90 tablet 1  . warfarin (COUMADIN) 5 MG tablet Take 5 mg along with 10 mg for a total  of 14m daily except 7.561mwith 1040mn Mondays, Wednesday and Fridays and Saturdays for a total of 17.5mg44m . alfuzosin (UROXATRAL) 10 MG 24 hr tablet Take 1 tablet (10 mg total) by mouth daily with breakfast. 30 tablet 3  . triamcinolone lotion (KENALOG) 0.1 % as needed. Use once daily as needed for skin irritation behind left ear      No current facility-administered medications for this encounter.     Physical Findings:  height is 5' 5"  (1.651 m) and weight is 242 lb 4.8 oz (109.9 kg). His oral temperature is 97.8 F (36.6 C). His blood pressure is 119/57 (abnormal) and his pulse is 52 (abnormal). His respiration is 17 and oxygen saturation  is 97%.  Pain Assessment Pain Score: 0-No pain/10 In general this is a well appearing Caucasian male in no acute distress.  He's alert and oriented x4 and appropriate throughout the examination. Cardiopulmonary assessment is negative for acute distress and he exhibits normal effort.   Lab Findings: Lab Results  Component Value Date   WBC 7.4 06/11/2017   HGB 13.3 11/16/2017   HCT 39.0 11/16/2017   MCV 84.2 06/11/2017   PLT 231 06/11/2017     Radiographic Findings: No results found.  Impression/Plan: 1. 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   He will continue to follow up with urology for ongoing PSA determinations.  He does not have a follow up appointment scheduled with Dr. McKeAlyson Inglesrently but will need repeat PSA in 04/2018.  He anticipates continuing androgen deprivation therapy for a total of 2 years therapy.  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  A prescription was sent to his pharmacy.  He understands what to expect with regards to PSA monitoring going forward. I will look forward to following his response to treatment via correspondence with urology, and would be happy to continue to participate in his care if clinically indicated. I talked to the patient about what to expect in the future, including his risk for erectile dysfunction and rectal bleeding. I encouraged him to call or return to the office if he has any questions regarding his previous radiation or possible radiation side effects. He was comfortable with this plan and will follow up as needed.    AshlNicholos Johns-C

## 2018-03-11 ENCOUNTER — Telehealth: Payer: Self-pay | Admitting: Radiation Oncology

## 2018-03-11 NOTE — Telephone Encounter (Signed)
Received voicemail message that patient was confusion about the directions for use of his alfluzosin. Phoned patient's home and spoke with his wife. Explained that per [employer], PA-C he should take the alfluzosin with his evening meal. She verbalized understanding and expressed appreciation for the return call.

## 2018-03-12 ENCOUNTER — Ambulatory Visit: Payer: Self-pay | Admitting: Urology

## 2018-03-12 ENCOUNTER — Other Ambulatory Visit: Payer: Self-pay | Admitting: *Deleted

## 2018-03-12 NOTE — Patient Outreach (Signed)
[facility]) Care Management  03/12/2018  Colton Miranda 11-05-1940 [id number]   Telephone Screen  Referral Date: 03/11/18 Referral Source: [facility] referral  Referral Reason: Member is having trouble paying for hospital bills. Currently owes $1507.41 to [place]: HTA  Outreach attempt #1 No answer. [facility] RN CM left HIPAA compliant voicemail message along with CM's contact info.  Plan: [facility] RN CM sent an unsuccessful outreach letter and scheduled this patient for another call attempt within 4 business days  Kimberly L. Lavina Hamman, RN, BSN, CCM [facility] Telephonic Care Management Care Coordinator Direct number [phone]  Main [facility] number [phone] Fax number [phone]

## 2018-03-12 NOTE — Patient Outreach (Signed)
[facility]) Care Management  03/12/2018  JAVANTE NILSSON 05/12/1941 [id number]   Care coordination  Mr & Mrs Mclean returned a call to [facility] RN CM at 1623 03/12/18  Patient is not able to verify HIPAA They asked questions about the referral from [facility]  Mrs Arakawa repeated asked the same information and CM answered her questions without discussing the details when she refused to verify HIPPA also They confirmed Mr Pilot called [facility] with concerns "frequently"   Plans [facility] CM encouraged Mr & Mrs Pfefferle to return a call to [facility] RN CM after they receive their [facility] unsuccessful letter [facility] RN CM sent an unsuccessful outreach letter and scheduled this patient for another call attempt within 4 business days  [place] L. Lavina Hamman, RN, BSN, CCM [facility] Telephonic Care Management Care Coordinator Direct number [phone]  Main [facility] number [phone] Fax number [phone]

## 2018-03-15 ENCOUNTER — Other Ambulatory Visit: Payer: Self-pay | Admitting: *Deleted

## 2018-03-15 ENCOUNTER — Ambulatory Visit (INDEPENDENT_AMBULATORY_CARE_PROVIDER_SITE_OTHER): Payer: PPO | Admitting: Nurse Practitioner

## 2018-03-15 ENCOUNTER — Ambulatory Visit: Payer: Self-pay | Admitting: *Deleted

## 2018-03-15 ENCOUNTER — Encounter: Payer: Self-pay | Admitting: Nurse Practitioner

## 2018-03-15 VITALS — BP 118/78 | HR 92 | Temp 98.3°F | Ht 65.0 in | Wt 242.0 lb

## 2018-03-15 DIAGNOSIS — S8012XA Contusion of left lower leg, initial encounter: Secondary | ICD-10-CM

## 2018-03-15 DIAGNOSIS — S8002XA Contusion of left knee, initial encounter: Secondary | ICD-10-CM | POA: Diagnosis not present

## 2018-03-15 DIAGNOSIS — W19XXXA Unspecified fall, initial encounter: Secondary | ICD-10-CM

## 2018-03-15 DIAGNOSIS — E119 Type 2 diabetes mellitus without complications: Secondary | ICD-10-CM

## 2018-03-15 MED ORDER — TRAMADOL HCL 50 MG PO TABS: ORAL_TABLET | ORAL | 0 refills | Status: DC

## 2018-03-15 NOTE — Progress Notes (Signed)
Careteam: Patient Care Team: Lauree Chandler, NP as PCP - General (Geriatric Medicine) [name], MD as Consulting Physician (Orthopedic Surgery) [name], MD as Consulting Physician (Cardiology) Barbaraann Faster, RN as [place] Management  Advanced Directive information Does Patient Have a Medical Advance Directive?: Yes, Type of Advance Directive: Healthcare Power of Attorney  No Known Allergies  Chief Complaint  Patient presents with  . Acute Visit    Pt is being seen due to a fall 2 days ago where patient landed on his left side. Pt was assisted up by emergency personal but has since had left knee swelling. Pt reports that it has now become painful to bare weight on the left knee.      HPI: Patient is a 77 y.o. male seen in the office today due to knee pain. 2 days ago tripped on the curb. Had to get [employer] to get him up. Was walking his dog and the dog got away so he went after her and then tripped with the dog.  Hit knee on the sidewalk. Once he got up and into apartment EMS checked him out. He was able to get up and get around okay.  Then yesterday morning pain was much worse and could not get out of bed.  Did not ice until later in the day and only iced once.  Pain 8-9/10. Using a tramadol which is effective for pain.  Review of Systems:  Review of Systems  Constitutional: Negative for chills, diaphoresis and fever.  Respiratory: Negative for hemoptysis.   Cardiovascular: Negative for chest pain and leg swelling.  Gastrointestinal: Negative for blood in stool.  Genitourinary: Negative for hematuria.  Musculoskeletal: Positive for falls, joint pain (knee) and myalgias. Negative for back pain.  Skin: Negative for itching and rash.  Neurological: Negative for dizziness, tremors, sensory change, speech change, focal weakness and headaches.  Endo/Heme/Allergies: Bruises/bleeds easily.    Past Medical History:  Diagnosis Date  . BPH  associated with nocturia   . Chronic constipation   . COPD (chronic obstructive pulmonary disease) ([place])   . History of adenomatous polyp of colon   . History of basal cell carcinoma (BCC) of skin    post excision from trunk  . 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 20 years ago , 1999, caused by blood clot after knee scope"  DVT to PE  . History of DVT (deep vein thrombosis)    11-06-2017 per pt and wife happened lower leg approx. 1999 after knee scope  . History of pulmonary embolus (PE)    11-06-2017 from DVT in approx. 1999 per wife and pt  . Hyperlipidemia   . Hypertension    followed by pcp  . Long term (current) use of anticoagulants Coumadin-- followed by Pharmasist w/ [facility]   secondary to primary hypercoagulopathy w/ hx DVT and PE  . Myalgia and myositis, unspecified   . OA (osteoarthritis)    "all joints"  . Pinched nerve in neck    per pt causes intermittant numbness bilateral arms and hands  . Primary hypercoagulable state ([place])   . Prostate cancer [facility]) urologist-  dr Alyson Ingles  oncologist-- dr Tammi Klippel   dx 08-28-2017  Stage T1c, Gleason 4+4, PSA 14.1, vol 42.8cc-- planned treatment external radiation and ADT  . Right bundle branch block   . Seborrheic dermatitis, unspecified   . Type 2 diabetes mellitus (  HCC)   . Urinary frequency   . Wears glasses    Past Surgical History:  Procedure Laterality Date  . CHOLECYSTECTOMY OPEN  1981  . COLONOSCOPY  last one 01-28-2017  . GOLD SEED IMPLANT N/A 11/16/2017   Procedure: GOLD SEED IMPLANT;  Surgeon: Cleon Gustin, MD;  Location: [facility];  Service: Urology;  Laterality: N/A;  . KNEE ARTHROSCOPY Right 1990s  . PILONIDAL CYST EXCISION  1968  . PROSTATE BIOPSY  09/10/2017    (done in office)   + Pos for Prostate Cancer, Dr.MCkinzie( [facility])   . SPACE OAR INSTILLATION N/A 11/16/2017   Procedure:  SPACE OAR INSTILLATION;  Surgeon: Cleon Gustin, MD;  Location: [facility];  Service: Urology;  Laterality: N/A;  . TOTAL HIP ARTHROPLASTY Right 06-18-2009   dr Wynelle Link  [facility]   Social History:   reports that he quit smoking about 9 years ago. His smoking use included cigarettes. He quit after 50.00 years of use. He has never used smokeless tobacco. He reports that he drinks about 1.8 oz of alcohol per week. He reports that he does not use drugs.  Family History  Problem Relation Age of Onset  . Cancer Mother 5       colon , breast  . Colon cancer Mother   . Heart attack Daughter        2008  . Parkinson's disease Sister   . Cancer Sister        breast  . Cancer Maternal Uncle        melanoma  . Cancer Paternal Uncle     Medications: Patient's Medications  New Prescriptions   No medications on file  Previous Medications   ALFUZOSIN (UROXATRAL) 10 MG 24 HR TABLET    Take 1 tablet (10 mg total) by mouth daily with breakfast.   BIOFLAVONOID PRODUCTS (BIOFLEX) TABS    Take 1 tablet by mouth every morning.   BLOOD GLUCOSE MONITORING SUPPL (ONETOUCH VERIO) W/DEVICE KIT    by Does not apply route. Check blood sugar twice daily as directed DX E11.65   CHOLECALCIFEROL (VITAMIN D) 2000 UNITS CAPS    Take 1 capsule by mouth daily.     DOCUSATE SODIUM (COLACE) 100 MG CAPSULE    Take 100 mg by mouth at bedtime.   LEUPROLIDE ACETATE, 3 MONTH, (LUPRON DEPOT, 40-MONTH, IM)    Give injection based on recommendations of Urologist.   LISINOPRIL (PRINIVIL,ZESTRIL) 20 MG TABLET    Take 10 mg by mouth daily.   MELATONIN 5 MG CAPS    Take 1 capsule (5 mg total) by mouth at bedtime.   METFORMIN (GLUCOPHAGE) 500 MG TABLET    TAKE 1 TABLET BY MOUTH ONCE DAILY WITH BREAKFAST   NYSTATIN (MYCOSTATIN/NYSTOP) POWDER    Apply 100,000 g topically daily as needed. Apply to groin area for yeast/fungal infection   ONETOUCH DELICA LANCETS 29B MISC    USE 1  TO CHECK GLUCOSE TWICE DAILY    ONETOUCH VERIO TEST STRIP    USE 1 STRIP TO CHECK GLUCOSE TWICE DAILY AS DIRECTED   SIMVASTATIN (ZOCOR) 20 MG TABLET    TAKE 1 TABLET BY MOUTH ONCE DAILY FOR CHOLESTEROL   TRAMADOL (ULTRAM) 50 MG TABLET    Take 1 tablet (50 mg total) by mouth every 6 (six) hours as needed.   TRIAMCINOLONE LOTION (KENALOG) 0.1 %    as needed. Use once daily as needed for skin irritation behind left ear  WARFARIN (COUMADIN) 10 MG TABLET    TAKE 1 TABLET BY MOUTH ONCE DAILY ALONG  WITH  A  5  MG  TABLET  FOR  A  TOTAL  OF  15  MG  DAILY   WARFARIN (COUMADIN) 5 MG TABLET    Take 5 mg along with 10 mg for a total of 75m daily except 7.577mwith 1070mn Mondays, Wednesday and Fridays and Saturdays for a total of 17.5mg76modified Medications   No medications on file  Discontinued Medications   No medications on file     Physical Exam:  Vitals:   03/15/18 1424  BP: 118/78  Pulse: 92  Temp: 98.3 F (36.8 C)  TempSrc: Oral  SpO2: 97%  Weight: 242 lb (109.8 kg)  Height: 5' 5"  (1.651 m)   Body mass index is 40.27 kg/m.  Physical Exam  Constitutional: He is oriented to person, place, and time. He appears well-developed and well-nourished.  HENT:  Head: Normocephalic.    Right Ear: External ear normal.  Left Ear: External ear normal.  Nose: Nose normal.  Mouth/Throat: Oropharynx is clear and moist. No oropharyngeal exudate.  Eyes: Pupils are equal, round, and reactive to light. Conjunctivae and EOM are normal.  Neck: Normal range of motion.  Cardiovascular: Normal rate and regular rhythm.  Pulmonary/Chest: Effort normal and breath sounds normal.  Musculoskeletal: He exhibits edema.       Left knee: He exhibits decreased range of motion and swelling. He exhibits no ecchymosis, no deformity, no laceration and no erythema. Tenderness found. Lateral joint line tenderness noted.  Neurological: He is alert and oriented to person, place, and time. He displays normal reflexes. No cranial nerve deficit or  sensory deficit. He exhibits normal muscle tone. Coordination normal.  Skin: Skin is warm and dry.    Labs reviewed: Basic Metabolic Panel: Recent Labs    06/11/17 0900 11/16/17 1438  NA 137 139  K 4.4 4.0  CL 103 102  CO2 24  --   GLUCOSE 106* 115*  BUN 20 20  CREATININE 1.16 0.90  CALCIUM 9.5  --    Liver Function Tests: Recent Labs    06/11/17 0900 12/02/17 1352  AST 20 28  ALT 21 45  BILITOT 0.7 0.4  PROT 7.0 6.5   No results for input(s): LIPASE, AMYLASE in the last 8760 hours. No results for input(s): AMMONIA in the last 8760 hours. CBC: Recent Labs    06/11/17 0900 11/16/17 1438  WBC 7.4  --   NEUTROABS 4,144  --   HGB 14.0 13.3  HCT 42.6 39.0  MCV 84.2  --   PLT 231  --    Lipid Panel: Recent Labs    06/11/17 0900  CHOL 111  HDL 47  LDLCALC 49  TRIG 70  CHOLHDL 2.4   TSH: No results for input(s): TSH in the last 8760 hours. A1C: Lab Results  Component Value Date   HGBA1C 6.7 (H) 12/02/2017     Assessment/Plan 1. Fall, initial encounter -ems initially came out for evaluation, did not wish to go to ED, overall doing well except now with ongoing knee pain. Discussed fall precautions. No neuro deficits noted.  - DG Knee Complete 4 Views Left; Future  2. Contusion of left knee and lower leg, initial encounter Ice knee- at least 3 times daily for 20 mins.  To use brace for support Elevate knee Do not over work the knee.  Tramadol 50 mg may use 1-2  tablets every 6 hours but no more than 6 tablets in 24 hours.  To take aleve 1 tablet twice daily for 5 days to help with inflammation.  - DG Knee Complete 4 Views Left; Future  Return precautions given [employer]. [place], [place] [facility] [phone]

## 2018-03-15 NOTE — Telephone Encounter (Signed)
Patient wife called and stated that they are at the pharmacy and need refill on Tramadol. Phoned to pharmacy. Patient seen today

## 2018-03-15 NOTE — Patient Instructions (Addendum)
Ice knee- at least 3 times daily for 20 mins.  To use brace for support Elevate knee Do not over work the knee.   Tramadol 50 mg may use 1-2 tablets every 6 hours but no more than 6 tablets in 24 hours.   To take aleve 1 tablet twice daily for 5 days to help with inflammation.

## 2018-03-16 ENCOUNTER — Other Ambulatory Visit: Payer: Self-pay | Admitting: *Deleted

## 2018-03-16 NOTE — Patient Outreach (Signed)
[facility] [facility]) Care Management  03/16/2018  Colton Miranda [birth year] [id number]   Telephone Screen  Referral Date: 03/11/18 Referral Source: HTA referral  Referral Reason: Member is having trouble paying for hospital bills. Currently owes $1507.41 to [place]: HTA  Outreach attempt #2 No answer. [facility] RN CM left HIPAA compliant voicemail message along with CM's contact info.  Plan: [facility] RN CM scheduled this patient for a third call attempt within 4 business days An unsuccessful outreach letter was sent on 03/12/18   Joelene Millin L. Lavina Hamman, RN, BSN, CCM [facility] Telephonic Care Management Care Coordinator Direct number [phone]  Main [facility] number [phone] Fax number [phone]

## 2018-03-18 ENCOUNTER — Other Ambulatory Visit: Payer: Self-pay | Admitting: *Deleted

## 2018-03-18 NOTE — Patient Outreach (Signed)
[facility]) Care Management  03/18/2018  EAN GETTEL 1941-03-09 [id number]   Telephone Screen  Referral Date:03/11/18 Referral Source:HTA referral Referral Reason:Member is having trouble paying for hospital bills. Currently owes $1507.41 to [facility] hospital Insurance:HTA  Outreach attempt #3 successful to Mr & Mrs Simien at the listed home number HIPAA verified partially with Mr Stults who gave permission and the phone to Mrs Roza who provided the rest after discussing robo calls and her concerns Reviewed and addressed referral to [facility] with patient  Mrs Brawley states she gets lots of "junk mail and I have not checked it" when CM inquired if she had received [facility] RN CM unsuccessful outreach letter After much persuasion Mrs Ozaki agreed to speak with CM. She reports she has called and spoken with several [employer] employees since the call to [employer] about their 1507.41 bill and they are now on a payment plan of $83+ for 18 months Mrs Bognar insisted on calling the [phone] to discuss this bill prior to agreeing to [facility] assistance Mrs Turnbaugh returned a call to [place] after she called [phone] and spoke with a male, Lamont. She is aware of her deductible and the need for the bill to be more than $5000 before financial assistance can be offered by [employer].   Mr Pingree states "overall I think we are okay. We will keep paying payments and will wait to see if we need to call [employer] again if more bills come in."  Social: Mr Holsinger lives with his wife, Threasa Beards, the primary caregiver. He is needing minimal assist at this time with mobility, ADLs and iADLs.  Presently their car is in the shop but plan on getting it back on March 19 2018    Conditions: DM 2, HTN, Hyperlipidemia, hx of falls, hx of prostate cancer and pulmonary embolism, morbid obesity   Medications: They denies concerns with taking medications as prescribed, affording  medications, side effects of medications and questions about medications  Appointments: States see provider every 3- 6 months and is scheduled to be seen on 03/31/18 Advance Directives: Denies need for assist with or assist with changes for advance directives They have a HPOA and they state their children knows what we want."  Consent: [facility] RN CM reviewed [facility] services with patient. Patient and wife denies need of services from [facility]/Telephonic RN CM, pharmacy or SW at this time    Plan: [facility] CM will close case at this time as patient has been assessed and no needs identified.    Makaylin Carlo L. Lavina Hamman, RN, BSN, CCM [facility] Telephonic Care Management Care Coordinator Direct number [phone]  Main [facility] number [phone] Fax number [phone]

## 2018-03-24 ENCOUNTER — Other Ambulatory Visit: Payer: Self-pay | Admitting: *Deleted

## 2018-03-24 MED ORDER — LISINOPRIL 20 MG PO TABS: 10.0000 mg | ORAL_TABLET | Freq: Every day | ORAL | 0 refills | Status: DC

## 2018-03-30 ENCOUNTER — Telehealth: Payer: Self-pay

## 2018-03-30 MED ORDER — LISINOPRIL 20 MG PO TABS
20.0000 mg | ORAL_TABLET | Freq: Every day | ORAL | 1 refills | Status: DC
Start: 2018-03-30 — End: 2022-02-08

## 2018-03-30 NOTE — Telephone Encounter (Signed)
Patient wife called- spoke with Sharon/RMA to question the dose of Lisinopril. Per Mrs.Mcgurn patient is taking Lisinopril 20 mg day yet recent rx was for 20 mg 1/2 tablet daily.  Ivin Booty was unable to locate when and where this medication change occurred and informed Mrs.Bottger that she will call her back.  I overheard conversation and looked further into it. I was able to locate a telephone encounter from 11/24/17 and 11/26/17. Patient with fluctuating B/P readings. Medication was decreased to 1/2 tablet due to low B/P readings. Patient was instructed to continue to check and if B/P remained 140/90 to go back to whole tablet (medication list was never updated)   I called patient's wife and discussed when this changed occurred and apologized that medication list was never updated.   New RX submitted to [facility] with corrected instructions. Mrs.Levitan expressed her gratitude, message handled to her apparent satisfaction.

## 2018-03-31 ENCOUNTER — Encounter: Payer: Self-pay | Admitting: Internal Medicine

## 2018-03-31 ENCOUNTER — Ambulatory Visit (INDEPENDENT_AMBULATORY_CARE_PROVIDER_SITE_OTHER): Payer: PPO | Admitting: Internal Medicine

## 2018-03-31 VITALS — BP 130/82 | HR 90 | Temp 97.4°F | Resp 20 | Ht 65.0 in | Wt 248.4 lb

## 2018-03-31 DIAGNOSIS — E1169 Type 2 diabetes mellitus with other specified complication: Secondary | ICD-10-CM

## 2018-03-31 DIAGNOSIS — Z7901 Long term (current) use of anticoagulants: Secondary | ICD-10-CM | POA: Diagnosis not present

## 2018-03-31 DIAGNOSIS — S8002XD Contusion of left knee, subsequent encounter: Secondary | ICD-10-CM | POA: Diagnosis not present

## 2018-03-31 DIAGNOSIS — E785 Hyperlipidemia, unspecified: Secondary | ICD-10-CM | POA: Diagnosis not present

## 2018-03-31 DIAGNOSIS — I10 Essential (primary) hypertension: Secondary | ICD-10-CM | POA: Diagnosis not present

## 2018-03-31 DIAGNOSIS — C61 Malignant neoplasm of prostate: Secondary | ICD-10-CM | POA: Diagnosis not present

## 2018-03-31 DIAGNOSIS — D6859 Other primary thrombophilia: Secondary | ICD-10-CM

## 2018-03-31 DIAGNOSIS — S8012XD Contusion of left lower leg, subsequent encounter: Secondary | ICD-10-CM | POA: Diagnosis not present

## 2018-03-31 LAB — POCT INR
INR: 1.9 — AB (ref 2.0–3.0)
INR: 1.9 — AB (ref <=1.1)

## 2018-03-31 MED ORDER — TRIAMCINOLONE ACETONIDE 0.1 % EX LOTN: TOPICAL_LOTION | CUTANEOUS | 1 refills | Status: DC | PRN

## 2018-03-31 NOTE — Progress Notes (Signed)
Patient ID: Colton Miranda, male   DOB: 12-17-1940, 77 y.o.   MRN: [id number]   Location:  [facility] OFFICE  Provider: DR Arletha Grippe  Code Status: Goals of Care:  Advanced Directives 03/18/2018  Does Patient Have a Medical Advance Directive? Yes  Type of Advance Directive [place]  Does patient want to make changes to medical advance directive? No - Patient declined  Copy of [place] in Chart? Yes     Chief Complaint  Patient presents with  . Medical Management of Chronic Issues    4 mo f/u- needs INR,     HPI: Patient is a 77 y.o. male seen today for medical management of chronic diseases.  He has a hx CVA, PE/DVT, on long term coumadin tx 2/2 hypercoagulable state (due to protein c or s deficiency), COPD, BPH. HTN and hyperlipidemia, DM. He gets high dose coumadin. GOAL INR 2-3. INR 2.1 in June 2019.  Fall in June 2019 - improving. He is now using can instead of walker. Knee pain better. He wears knee brace. Rarely uses tramadol for pain  Prostate CA - s/p seed brachytherapy. He is followed by urology Dr Alyson Ingles. No issues with hesitant urination. He takes uroxatrol.  DM - stable on metformin. A1c 6.7%; LDL 49  Hyperlipidemia - stable on simvastatin. LDL 49   Past Medical History:  Diagnosis Date  . BPH associated with nocturia   . Chronic constipation   . COPD (chronic obstructive pulmonary disease) ([place])   . History of adenomatous polyp of colon   . History of basal cell carcinoma (BCC) of skin    post excision from trunk  . 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 20 years ago , 1999, caused by blood clot after knee scope"  DVT to PE  . History of DVT (deep vein thrombosis)    11-06-2017 per pt and wife happened lower leg approx. 1999 after knee scope  . History of pulmonary embolus (PE)    11-06-2017 from DVT in approx. 1999 per wife and pt  .  Hyperlipidemia   . Hypertension    followed by pcp  . Long term (current) use of anticoagulants Coumadin-- followed by Pharmasist w/ [facility]   secondary to primary hypercoagulopathy w/ hx DVT and PE  . Myalgia and myositis, unspecified   . OA (osteoarthritis)    "all joints"  . Pinched nerve in neck    per pt causes intermittant numbness bilateral arms and hands  . Primary hypercoagulable state ([place])   . Prostate cancer [facility]) urologist-  dr Alyson Ingles  oncologist-- dr Tammi Klippel   dx 08-28-2017  Stage T1c, Gleason 4+4, PSA 14.1, vol 42.8cc-- planned treatment external radiation and ADT  . Right bundle branch block   . Seborrheic dermatitis, unspecified   . Type 2 diabetes mellitus ([place])   . Urinary frequency   . Wears glasses     Past Surgical History:  Procedure Laterality Date  . CHOLECYSTECTOMY OPEN  1981  . COLONOSCOPY  last one 01-28-2017  . GOLD SEED IMPLANT N/A 11/16/2017   Procedure: GOLD SEED IMPLANT;  Surgeon: Cleon Gustin, MD;  [facility];  Service: Urology;  Laterality: N/A;  . KNEE ARTHROSCOPY Right 1990s  . PILONIDAL CYST EXCISION  1968  . PROSTATE BIOPSY  09/10/2017    (done in office)   + Pos for Prostate Cancer,  Dr.MCkinzie( [facility])   . SPACE OAR INSTILLATION N/A 11/16/2017   Procedure: SPACE OAR INSTILLATION;  Surgeon: Cleon Gustin, MD;  Location: [facility];  Service: Urology;  Laterality: N/A;  . TOTAL HIP ARTHROPLASTY Right 06-18-2009   dr Wynelle Link  [facility]     reports that he quit smoking about 9 years ago. His smoking use included cigarettes. He quit after 50.00 years of use. He has never used smokeless tobacco. He reports that he drinks about 1.8 oz of alcohol per week. He reports that he does not use drugs. Social History   Socioeconomic History  . Marital status: Married    Spouse name: Not on file  . Number of children: Not on file  . Years of education: Not on file  . Highest  education level: Not on file  Occupational History  . Not on file  Social Needs  . Financial resource strain: Not very hard  . Food insecurity:    Worry: Never true    Inability: Never true  . Transportation needs:    Medical: No    Non-medical: No  Tobacco Use  . Smoking status: Former Smoker    Years: 50.00    Types: Cigarettes    Last attempt to quit: 01/03/2009    Years since quitting: 9.2  . Smokeless tobacco: Never Used  Substance and Sexual Activity  . Alcohol use: Yes    Alcohol/week: 1.8 oz    Types: 3 Cans of beer per week  . Drug use: No  . Sexual activity: Not Currently    Partners: Female  Lifestyle  . Physical activity:    Days per week: 0 days    Minutes per session: 0 min  . Stress: Only a little  Relationships  . Social connections:    Talks on phone: More than three times a week    Gets together: Twice a week    Attends religious service: Never    Active member of club or organization: No    Attends meetings of clubs or organizations: Never    Relationship status: Married  . Intimate partner violence:    Fear of current or ex partner: No    Emotionally abused: No    Physically abused: No    Forced sexual activity: No  Other Topics Concern  . Not on file  Social History Narrative  . Not on file    Family History  Problem Relation Age of Onset  . Cancer Mother 77       colon , breast  . Colon cancer Mother   . Heart attack Daughter        2008  . Parkinson's disease Sister   . Cancer Sister        breast  . Cancer Maternal Uncle        melanoma  . Cancer Paternal Uncle     No Known Allergies  Outpatient Encounter Medications as of 03/31/2018  Medication Sig  . alfuzosin (UROXATRAL) 10 MG 24 hr tablet Take 1 tablet (10 mg total) by mouth daily with breakfast. (Patient taking differently: Take 10 mg by mouth daily. Take medications after dinner.)  . Bioflavonoid Products (BIOFLEX) TABS Take 1 tablet by mouth every morning.  . Blood  Glucose Monitoring Suppl (ONETOUCH VERIO) w/Device KIT by Does not apply route. Check blood sugar twice daily as directed DX E11.65  . Cholecalciferol (VITAMIN D) 2000 UNITS CAPS Take 1 capsule by mouth daily.    Marland Kitchen docusate  sodium (COLACE) 100 MG capsule Take 100 mg by mouth at bedtime.  Marland Kitchen Leuprolide Acetate, 3 Month, (LUPRON DEPOT, 83-MONTH, IM) Give injection based on recommendations of Urologist.  . lisinopril (PRINIVIL,ZESTRIL) 20 MG tablet Take 1 tablet (20 mg total) by mouth daily.  . Melatonin 5 MG CAPS Take 1 capsule (5 mg total) by mouth at bedtime.  . metFORMIN (GLUCOPHAGE) 500 MG tablet TAKE 1 TABLET BY MOUTH ONCE DAILY WITH BREAKFAST  . nystatin (MYCOSTATIN/NYSTOP) powder Apply 100,000 g topically daily as needed. Apply to groin area for yeast/fungal infection  . ONETOUCH DELICA LANCETS 37S MISC USE 1  TO CHECK GLUCOSE TWICE DAILY  . ONETOUCH VERIO test strip USE 1 STRIP TO CHECK GLUCOSE TWICE DAILY AS DIRECTED  . simvastatin (ZOCOR) 20 MG tablet TAKE 1 TABLET BY MOUTH ONCE DAILY FOR CHOLESTEROL  . traMADol (ULTRAM) 50 MG tablet Take one to two tablets by mouth every 6 hours as needed for pain. Do not take more than 6 tablets in 24 hours  . triamcinolone lotion (KENALOG) 0.1 % Apply topically as needed. Use once daily as needed for skin irritation behind left ear  . warfarin (COUMADIN) 10 MG tablet TAKE 1 TABLET BY MOUTH ONCE DAILY ALONG  WITH  A  5  MG  TABLET  FOR  A  TOTAL  OF  15  MG  DAILY  . warfarin (COUMADIN) 5 MG tablet Take 5 mg along with 10 mg for a total of 34m daily except 7.513mwith 1029mn Mondays, Wednesday and Fridays and Saturdays for a total of 17.5mg73m [DISCONTINUED] triamcinolone lotion (KENALOG) 0.1 % as needed. Use once daily as needed for skin irritation behind left ear    No facility-administered encounter medications on file as of 03/31/2018.     Review of Systems:  Review of Systems  Cardiovascular: Positive for palpitations.  Musculoskeletal: Positive  for arthralgias, gait problem and joint swelling.  Hematological: Bruises/bleeds easily.  All other systems reviewed and are negative.   Health Maintenance  Topic Date Due  . TETANUS/TDAP  12/03/2018 (Originally 03/25/1960)  . FOOT EXAM  06/04/2018  . HEMOGLOBIN A1C  06/04/2018  . OPHTHALMOLOGY EXAM  12/25/2018  . COLONOSCOPY  01/28/2022  . PNA vac Low Risk Adult  Completed  . INFLUENZA VACCINE  Discontinued    Physical Exam: Vitals:   03/31/18 1354  BP: 130/82  Pulse: 90  Resp: 20  Temp: (!) 97.4 F (36.3 C)  TempSrc: Oral  SpO2: 98%  Weight: 248 lb 6.4 oz (112.7 kg)  Height: 5' 5"  (1.651 m)   Body mass index is 41.34 kg/m. Physical Exam  Constitutional: He is oriented to person, place, and time. He appears well-developed and well-nourished.  HENT:  Mouth/Throat: Oropharynx is clear and moist.  MMM; no oral thrush  Eyes: Pupils are equal, round, and reactive to light. No scleral icterus.  Neck: Neck supple. Carotid bruit is not present. No thyromegaly present.  Cardiovascular: Normal rate, regular rhythm and intact distal pulses. Exam reveals no gallop and no friction rub.  Murmur (1/6 SEM) heard. no distal LE swelling. No calf TTP  Pulmonary/Chest: Effort normal and breath sounds normal. He has no wheezes. He has no rales. He exhibits no tenderness.  Abdominal: Soft. Bowel sounds are normal. He exhibits no distension, no abdominal bruit, no pulsatile midline mass and no mass. There is no hepatomegaly. There is no tenderness. There is no rebound and no guarding.  obese  Musculoskeletal: He exhibits edema (left  knee with reduced ROM; small and large joint swelling) and tenderness.  Lymphadenopathy:    He has no cervical adenopathy.  Neurological: He is alert and oriented to person, place, and time. He has normal reflexes. Gait (antalgic) abnormal.  Skin: Skin is warm and dry. No rash noted.  Min yellowing contusions  Psychiatric: He has a normal mood and affect. His  behavior is normal. Judgment and thought content normal.    Labs reviewed: Basic Metabolic Panel: Recent Labs    06/11/17 0900 11/16/17 1438  NA 137 139  K 4.4 4.0  CL 103 102  CO2 24  --   GLUCOSE 106* 115*  BUN 20 20  CREATININE 1.16 0.90  CALCIUM 9.5  --    Liver Function Tests: Recent Labs    06/11/17 0900 12/02/17 1352  AST 20 28  ALT 21 45  BILITOT 0.7 0.4  PROT 7.0 6.5   No results for input(s): LIPASE, AMYLASE in the last 8760 hours. No results for input(s): AMMONIA in the last 8760 hours. CBC: Recent Labs    06/11/17 0900 11/16/17 1438  WBC 7.4  --   NEUTROABS 4,144  --   HGB 14.0 13.3  HCT 42.6 39.0  MCV 84.2  --   PLT 231  --    Lipid Panel: Recent Labs    06/11/17 0900  CHOL 111  HDL 47  LDLCALC 49  TRIG 70  CHOLHDL 2.4   Lab Results  Component Value Date   HGBA1C 6.7 (H) 12/02/2017    Procedures since last visit: No results found.  Assessment/Plan   ICD-10-CM   1. Primary hypercoagulable state ([place]) D68.59 POC INR  2. Long term current use of anticoagulant therapy Z79.01 POC INR  3. Contusion of left knee and lower leg, subsequent encounter S80.02XD    S80.12XD   4. Type 2 diabetes mellitus with other specified complication, without long-term current use of insulin (HCC) E11.69   5. Essential hypertension I10   6. Malignant neoplasm of prostate ([place]) C61   7. Hyperlipidemia associated with type 2 diabetes mellitus (HCC) E11.69    E78.5    INR  1.9: TAKE 20MG COUMADIN TONIGHT THEN RESUME NORMAL SCHEDULE TOMORROW. NEEDS APPT FOR INR IN 2 WEEKS   Continue current medications as ordered  Follow up with specialists as scheduled  Follow up in 3 mos with Janett Billow for DM, HTN, hyperlipidemia, hypercoagulable state. INR in 2 weeks. Fasting labs prior to appt in 3 mos (BMP, ALT, A1C, LIPID PANEL)  Cyanne Delmar S. Perlie Gold  [facility] [address] [place], [place]  [address] [phone] Cell (Monday-Friday 8 AM - 5 PM) [phone] After 5 PM and follow prompts

## 2018-03-31 NOTE — Patient Instructions (Addendum)
INR  1.9: TAKE 20MG  COUMADIN TONIGHT THEN RESUME NORMAL SCHEDULE TOMORROW. NEEDS APPT FOR INR IN 2 WEEKS   Continue current medications as ordered  Follow up with specialists as scheduled  Follow up in 3 mos with Colton Miranda for DM, HTN, hyperlipidemia, hypercoagulable state. INR in 2 weeks. Fasting labs prior to appt in 3 mos.

## 2018-04-14 ENCOUNTER — Encounter: Payer: Self-pay | Admitting: Internal Medicine

## 2018-04-14 ENCOUNTER — Ambulatory Visit (INDEPENDENT_AMBULATORY_CARE_PROVIDER_SITE_OTHER): Payer: PPO | Admitting: Internal Medicine

## 2018-04-14 ENCOUNTER — Other Ambulatory Visit: Payer: Self-pay

## 2018-04-14 DIAGNOSIS — Z7901 Long term (current) use of anticoagulants: Secondary | ICD-10-CM

## 2018-04-14 DIAGNOSIS — E1351 Other specified diabetes mellitus with diabetic peripheral angiopathy without gangrene: Secondary | ICD-10-CM

## 2018-04-14 DIAGNOSIS — D6859 Other primary thrombophilia: Secondary | ICD-10-CM

## 2018-04-14 DIAGNOSIS — E1365 Other specified diabetes mellitus with hyperglycemia: Principal | ICD-10-CM

## 2018-04-14 DIAGNOSIS — I480 Paroxysmal atrial fibrillation: Secondary | ICD-10-CM | POA: Diagnosis not present

## 2018-04-14 DIAGNOSIS — IMO0002 Reserved for concepts with insufficient information to code with codable children: Secondary | ICD-10-CM

## 2018-04-14 LAB — POCT INR: INR: 2.3 (ref 2.0–3.0)

## 2018-04-14 MED ORDER — WARFARIN SODIUM 10 MG PO TABS: ORAL_TABLET | ORAL | 1 refills | Status: AC

## 2018-04-14 NOTE — Patient Instructions (Signed)
Podiatry evaluation of the fungal nail changes is recommended as prevention. No change in warfarin, recheck PT/INR in one month.

## 2018-04-14 NOTE — Assessment & Plan Note (Signed)
Podiatry evaluation for onychomycosis recommended A1c update recommended

## 2018-04-14 NOTE — Assessment & Plan Note (Signed)
No change in warfarin, repeat PT/INR 1 month

## 2018-04-14 NOTE — Assessment & Plan Note (Signed)
Lifelong warfarin

## 2018-04-14 NOTE — Progress Notes (Signed)
04/14/2018  This is a [employer] office visit  follow up of chronic anticoagulation.  Interim medical record and care since last [place] visit was updated with review of diagnostic studies and change in clinical status since last visit were documented.  HPI: The patient is on long-term warfarin; he has a history of DVT following knee arthroscopy. 6 weeks after the DVT he had a stroke. There was no documented PFO; but he was found to have a hypercoagulable state.  PT/INR is 2.3, up from a subtherapeutic value of 1.9 on 7/10. He sees Dr.Ganji every December. On one occasion atrial fibrillation was suspect but not documented.  Past medical history includes type 2 diabetes, history of prostate cancer, diffuse osteoarthritis, essential hypertension, history of CVA, history adenomatous colon polyp, and COPD. Significant surgeries include THA, open cholecystectomy, and seed implants for prostate cancer. Family history reviewed. There is a significant history for cancer including colon cancer in his mother. Patient drinks socially. He quit smoking in 2010.  Review of systems: He denies any active bleeding dyscrasias.  He is wearing a brace on the left knee related to a fall one month ago. This was a mechanical event with no cardiac or neurologic prodrome. He states that his fasting glucoses are 100-120. He denies any hypoglycemia. He does have frequency but no other associated diabetic symptoms. His last A1c in March was 6.7 Has chronic toenail deformities related to onychomycosis. According to his wife he's declined podiatry evaluation. His ophthalmologic exam is up-to-date.  Constitutional: No fever, significant weight change, fatigue  Eyes: No redness, discharge, pain, vision change ENT/mouth: No nasal congestion,  purulent discharge, earache, change in hearing, sore throat  Cardiovascular: No chest pain, palpitations, paroxysmal nocturnal dyspnea, claudication, edema  Respiratory: No  cough, sputum production, hemoptysis, DOE, significant snoring, apnea   Gastrointestinal: No heartburn, dysphagia, abdominal pain, nausea /vomiting, rectal bleeding, melena, change in bowels Genitourinary: No dysuria, hematuria, pyuria, incontinence, nocturia Dermatologic: No rash, pruritus, change in appearance of skin Neurologic: No dizziness, headache, syncope, seizures, numbness, tingling Psychiatric: No significant anxiety, depression, insomnia, anorexia Endocrine: No change in hair/skin/nails, excessive thirst, excessive hunger Hematologic/lymphatic: No significant bruising, lymphadenopathy, abnormal bleeding Allergy/immunology: No itchy/watery eyes, significant sneezing, urticaria, angioedema  Physical exam:  Pertinent or positive findings: Ptosis is present greater on the left. He has a Engineering geologist. The maxilla is edentulous. The heart rhythm and rate are slightly irregular but slow. Abdomen is protuberant. Pulses are decreased. He has trace edema. There is marked deformity of the toenails. Fusiform changes of the knees are present. He wears wrap on the left knee. No effusion is palpable.  General appearance: Adequately nourished; no acute distress, increased work of breathing is present.   Lymphatic: No lymphadenopathy about the head, neck, axilla. Eyes: No conjunctival inflammation or lid edema is present. There is no scleral icterus. Ears:  External ear exam shows no significant lesions or deformities.   Nose:  External nasal examination shows no deformity or inflammation. Nasal mucosa are pink and moist without lesions, exudates Oral exam:  Lips and gums are healthy appearing. There is no oropharyngeal erythema or exudate. Neck:  No thyromegaly, masses, tenderness noted.    Heart:  without gallop, murmur, click, rub .  Lungs: Chest clear to auscultation without wheezes, rhonchi, rales, rubs. Abdomen: Bowel sounds are normal. Abdomen is soft and nontender with no organomegaly, hernias,  masses. GU: Deferred  Extremities:  No cyanosis, clubbing  Skin: Warm & dry w/o tenting. No significant lesions  or rash.  See summary under each active problem in the Problem List with associated updated therapeutic plan

## 2018-04-14 NOTE — Assessment & Plan Note (Addendum)
04/14/18 A. fib with slow ventricular rate suggested clinically Rate control would be goal rather than conversion to normal sinus rhythm Notify Dr Einar Gip of findings

## 2018-04-15 LAB — HEMOGLOBIN A1C
HEMOGLOBIN A1C: 6.5 %{Hb} — AB (ref <5.7)
Mean Plasma Glucose: 140 (calc)
eAG (mmol/L): 7.7 (calc)

## 2018-04-30 ENCOUNTER — Ambulatory Visit (HOSPITAL_COMMUNITY)
Admission: EM | Admit: 2018-04-30 | Discharge: 2018-04-30 | Disposition: A | Discharge status: Home / Self Care | Payer: PPO | Attending: Family Medicine | Admitting: Family Medicine

## 2018-04-30 ENCOUNTER — Encounter (HOSPITAL_COMMUNITY): Payer: Self-pay | Admitting: Family Medicine

## 2018-04-30 ENCOUNTER — Telehealth: Payer: Self-pay

## 2018-04-30 ENCOUNTER — Other Ambulatory Visit: Payer: Self-pay

## 2018-04-30 DIAGNOSIS — Z85828 Personal history of other malignant neoplasm of skin: Secondary | ICD-10-CM | POA: Insufficient documentation

## 2018-04-30 DIAGNOSIS — E785 Hyperlipidemia, unspecified: Secondary | ICD-10-CM | POA: Diagnosis not present

## 2018-04-30 DIAGNOSIS — Z79899 Other long term (current) drug therapy: Secondary | ICD-10-CM | POA: Diagnosis not present

## 2018-04-30 DIAGNOSIS — Z7901 Long term (current) use of anticoagulants: Secondary | ICD-10-CM | POA: Diagnosis not present

## 2018-04-30 DIAGNOSIS — I480 Paroxysmal atrial fibrillation: Secondary | ICD-10-CM | POA: Diagnosis not present

## 2018-04-30 DIAGNOSIS — J449 Chronic obstructive pulmonary disease, unspecified: Secondary | ICD-10-CM | POA: Insufficient documentation

## 2018-04-30 DIAGNOSIS — Z7984 Long term (current) use of oral hypoglycemic drugs: Secondary | ICD-10-CM | POA: Insufficient documentation

## 2018-04-30 DIAGNOSIS — Z86718 Personal history of other venous thrombosis and embolism: Secondary | ICD-10-CM | POA: Insufficient documentation

## 2018-04-30 DIAGNOSIS — Z8546 Personal history of malignant neoplasm of prostate: Secondary | ICD-10-CM | POA: Insufficient documentation

## 2018-04-30 DIAGNOSIS — Z8673 Personal history of transient ischemic attack (TIA), and cerebral infarction without residual deficits: Secondary | ICD-10-CM | POA: Insufficient documentation

## 2018-04-30 DIAGNOSIS — E119 Type 2 diabetes mellitus without complications: Secondary | ICD-10-CM | POA: Diagnosis not present

## 2018-04-30 DIAGNOSIS — Z87891 Personal history of nicotine dependence: Secondary | ICD-10-CM | POA: Insufficient documentation

## 2018-04-30 DIAGNOSIS — I4510 Unspecified right bundle-branch block: Secondary | ICD-10-CM | POA: Insufficient documentation

## 2018-04-30 DIAGNOSIS — Z86711 Personal history of pulmonary embolism: Secondary | ICD-10-CM | POA: Diagnosis not present

## 2018-04-30 DIAGNOSIS — Z6841 Body Mass Index (BMI) 40.0 and over, adult: Secondary | ICD-10-CM | POA: Diagnosis not present

## 2018-04-30 DIAGNOSIS — J029 Acute pharyngitis, unspecified: Secondary | ICD-10-CM | POA: Diagnosis not present

## 2018-04-30 DIAGNOSIS — R21 Rash and other nonspecific skin eruption: Secondary | ICD-10-CM | POA: Diagnosis not present

## 2018-04-30 DIAGNOSIS — I10 Essential (primary) hypertension: Secondary | ICD-10-CM | POA: Insufficient documentation

## 2018-04-30 DIAGNOSIS — E669 Obesity, unspecified: Secondary | ICD-10-CM | POA: Diagnosis not present

## 2018-04-30 LAB — POCT RAPID STREP A: Streptococcus, Group A Screen (Direct): NEGATIVE

## 2018-04-30 NOTE — Telephone Encounter (Signed)
Patient's wife called to reiterate previous message and to say patient now with throat swelling. I advised patient to call 911, seek immediate attention at Urgent Care or Emergency Room.  Mrs.Hayworth agreed

## 2018-04-30 NOTE — Discharge Instructions (Addendum)
It was nice meeting you!!  The strep test was negative. This is a vial rash. You can take zyrtec for the symptoms. It should resolve in 4 or 5 days.  Return as needed.

## 2018-04-30 NOTE — Telephone Encounter (Signed)
noted 

## 2018-04-30 NOTE — ED Provider Notes (Signed)
[place]    CSN: [id number] Arrival date & time: 04/30/18  1044     History   Chief Complaint Chief Complaint  Patient presents with  . Rash  . Sore Throat    HPI Colton Miranda is a 77 y.o. male.   Pt here for widespread rash that started about 6 pm yesterday. The rash started on his arms. Now it has spread to the chest, back and legs. He reports not that itchy and non painful.  Denies any fever, joint pain. Denies any recent changes in lotions, detergents, foods or other possible irritants. No new medicines.  No recent travel. Nobody else at home has the rash. Patient has been outside but denies any contact with plants or insects. Reports this am he was worried because his uvula was swollen. Denies any trouble swallowing, breathing. He has had no recent illness.   ROS per HPI       Past Medical History:  Diagnosis Date  . BPH associated with nocturia   . Chronic constipation   . COPD (chronic obstructive pulmonary disease) ([place])   . History of adenomatous polyp of colon   . History of basal cell carcinoma (BCC) of skin    post excision from trunk  . 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 20 years ago , 1999, caused by blood clot after knee scope"  DVT to PE  . History of DVT (deep vein thrombosis)    11-06-2017 per pt and wife happened lower leg approx. 1999 after knee scope  . History of pulmonary embolus (PE)    11-06-2017 from DVT in approx. 1999 per wife and pt  . Hyperlipidemia   . Hypertension    followed by pcp  . Long term (current) use of anticoagulants Coumadin-- followed by Pharmasist w/ [facility]   secondary to primary hypercoagulopathy w/ hx DVT and PE  . Myalgia and myositis, unspecified   . OA (osteoarthritis)    "all joints"  . Pinched nerve in neck    per pt causes intermittant numbness bilateral arms and hands  . Primary hypercoagulable  state ([place])   . Prostate cancer [facility]) urologist-  dr Alyson Ingles  oncologist-- dr Tammi Klippel   dx 08-28-2017  Stage T1c, Gleason 4+4, PSA 14.1, vol 42.8cc-- planned treatment external radiation and ADT  . Right bundle branch block   . Seborrheic dermatitis, unspecified   . Type 2 diabetes mellitus ([place])   . Urinary frequency   . Wears glasses     Patient Active Problem List   Diagnosis Date Noted  . PAF (paroxysmal atrial fibrillation) ([place]) 04/14/2018  . Malignant neoplasm of prostate ([place]) 10/07/2017  . Colon polyps 11/26/2016  . Influenza vaccination declined 05/28/2016  . History of fall 07/25/2015  . Palpitations 11/08/2014  . Pain of left arm 11/08/2014  . Pain in joint, lower leg 02/28/2014  . Essential hypertension 02/28/2014  . Disturbance of skin sensation 02/23/2013  . Insomnia, unspecified 02/23/2013  . Obesity   . DM (diabetes mellitus), secondary, uncontrolled, with peripheral vascular complications ([place])   . Hyperlipemia   . Urinary frequency   . Primary hypercoagulable state ([place]) 01/03/2013  . Other pulmonary embolism and infarction 01/03/2013  . Long term current use of anticoagulant therapy 01/03/2013    Past Surgical History:  Procedure Laterality Date  . CHOLECYSTECTOMY OPEN  1981  . COLONOSCOPY  last one 01-28-2017  . GOLD  SEED IMPLANT N/A 11/16/2017   Procedure: GOLD SEED IMPLANT;  Surgeon: Cleon Gustin, MD;  Location: [facility];  Service: Urology;  Laterality: N/A;  . KNEE ARTHROSCOPY Right 1990s  . PILONIDAL CYST EXCISION  1968  . PROSTATE BIOPSY  09/10/2017    (done in office)   + Pos for Prostate Cancer, Dr.MCkinzie( [facility])   . SPACE OAR INSTILLATION N/A 11/16/2017   Procedure: SPACE OAR INSTILLATION;  Surgeon: Cleon Gustin, MD;  Location: [facility];  Service: Urology;  Laterality: N/A;  . TOTAL HIP ARTHROPLASTY Right 06-18-2009   dr Wynelle Link  [facility]       Home Medications    Prior to  Admission medications   Medication Sig Start Date End Date Taking? Authorizing Provider  Bioflavonoid Products (BIOFLEX) TABS Take 1 tablet by mouth every morning.    [provider]  Blood Glucose Monitoring Suppl (ONETOUCH VERIO) w/Device KIT by Does not apply route. Check blood sugar twice daily as directed DX E11.65    [provider]  Cholecalciferol (VITAMIN D) 2000 UNITS CAPS Take 1 capsule by mouth daily.      [provider]  docusate sodium (COLACE) 100 MG capsule Take 100 mg by mouth at bedtime.    [provider]  Leuprolide Acetate, 3 Month, (LUPRON DEPOT, 92-MONTH, IM) Give injection based on recommendations of Urologist.    [provider]  lisinopril (PRINIVIL,ZESTRIL) 20 MG tablet Take 1 tablet (20 mg total) by mouth daily. 03/30/18   Lauree Chandler, NP  Melatonin 5 MG CAPS Take 1 capsule (5 mg total) by mouth at bedtime. 03/27/17   Lauree Chandler, NP  metFORMIN (GLUCOPHAGE) 500 MG tablet TAKE 1 TABLET BY MOUTH ONCE DAILY WITH BREAKFAST 01/25/18   Lauree Chandler, NP  nystatin (MYCOSTATIN/NYSTOP) powder Apply 100,000 g topically daily as needed. Apply to groin area for yeast/fungal infection 02/17/18   Lauree Chandler, NP  [facility] DELICA LANCETS 82N MISC USE 1  TO CHECK GLUCOSE TWICE DAILY 03/02/18   Lauree Chandler, NP  [facility] VERIO test strip USE 1 STRIP TO CHECK GLUCOSE TWICE DAILY AS DIRECTED 03/02/18   Lauree Chandler, NP  simvastatin (ZOCOR) 20 MG tablet TAKE 1 TABLET BY MOUTH ONCE DAILY FOR CHOLESTEROL 03/02/18   Lauree Chandler, NP  traMADol (ULTRAM) 50 MG tablet Take one to two tablets by mouth every 6 hours as needed for pain. Do not take more than 6 tablets in 24 hours 03/15/18   Lauree Chandler, NP  warfarin (COUMADIN) 10 MG tablet TAKE 1 TABLET BY MOUTH ONCE DAILY ALONG  WITH  A  5  MG  TABLET  FOR  A  TOTAL  OF  15  MG  DAILY 04/14/18   Hendricks Limes, MD  warfarin (COUMADIN) 5 MG tablet Take 5 mg along  with 10 mg for a total of 8m daily except 7.571mwith 1050mn Mondays, Wednesday and Fridays and Saturdays for a total of 17.5mg9m [provider]    Family History Family History  Problem Relation Age of Onset  . Cancer Mother 97  58   colon , breast  . Colon cancer Mother   . Heart attack Daughter        2008  . Parkinson's disease Sister   . Cancer Sister        breast  . Cancer Maternal Uncle  melanoma  . Cancer Paternal Uncle     Social History Social History   Tobacco Use  . Smoking status: Former Smoker    Years: 50.00    Types: Cigarettes    Last attempt to quit: 01/03/2009    Years since quitting: 9.3  . Smokeless tobacco: Never Used  Substance Use Topics  . Alcohol use: Yes    Alcohol/week: 3.0 standard drinks    Types: 3 Cans of beer per week  . Drug use: No     Allergies   Patient has no known allergies.   Review of Systems Review of Systems   Physical Exam Triage Vital Signs ED Triage Vitals  Enc Vitals Group     BP 04/30/18 1104 112/60     Pulse Rate 04/30/18 1100 60     Resp 04/30/18 1100 18     Temp 04/30/18 1100 98 F (36.7 C)     Temp Source 04/30/18 1100 Oral     SpO2 04/30/18 1100 97 %     Weight 04/30/18 1102 250 lb (113.4 kg)     Height --      Head Circumference --      Peak Flow --      Pain Score 04/30/18 1100 3     Pain Loc --      Pain Edu? --      Excl. in [place]? --    No data found.  Updated Vital Signs BP 112/60   Pulse 60   Temp 98 F (36.7 C) (Oral)   Resp 18   Wt 250 lb (113.4 kg)   SpO2 97%   BMI 41.60 kg/m   Visual Acuity Right Eye Distance:   Left Eye Distance:   Bilateral Distance:    Right Eye Near:   Left Eye Near:    Bilateral Near:     Physical Exam  Constitutional: He appears well-developed and well-nourished.  Non-toxic appearance. He does not appear ill.  HENT:  Right Ear: Tympanic membrane normal.  Mouth/Throat: Uvula is midline, oropharynx is clear and moist and mucous  membranes are normal. No oral lesions. Uvula swelling present. No oropharyngeal exudate, posterior oropharyngeal edema, posterior oropharyngeal erythema or tonsillar abscesses. Tonsils are 0 on the right. Tonsils are 0 on the left. No tonsillar exudate.  Eyes: Pupils are equal, round, and reactive to light.  Cardiovascular: Normal rate, regular rhythm and normal heart sounds.  Pulmonary/Chest: Effort normal and breath sounds normal.  Lymphadenopathy:    He has cervical adenopathy.  Skin: Skin is warm and dry.  Morbilliform rash to chest, arms, back and legs.   Nursing note and vitals reviewed.    UC Treatments / Results  Labs (all labs ordered are listed, but only abnormal results are displayed) Labs Reviewed - No data to display  EKG None  Radiology No results found.  Procedures Procedures (including critical care time)  Medications Ordered in UC Medications - No data to display  Initial Impression / Assessment and Plan / UC Course  I have reviewed the triage vital signs and the nursing notes.  Pertinent labs & imaging results that were available during my care of the patient were reviewed by me and considered in my medical decision making (see chart for details).     Strep test negative. Will send for culture.  Most likely viral exanthem. Zyrtec for symptoms. follow up as needed.  Final Clinical Impressions(s) / UC Diagnoses   Final diagnoses:  None   Discharge Instructions  None    ED Prescriptions    None     Controlled Substance Prescriptions  Controlled Substance Registry consulted? Not Applicable   Orvan July, NP 04/30/18 1139

## 2018-04-30 NOTE — ED Triage Notes (Signed)
Rash and sore throat this started last night.

## 2018-04-30 NOTE — Telephone Encounter (Signed)
Patient woke up with a red, itching rash over entire body. Patient no clue what caused rash. No new medications, no new foods, and no outdoor yard work.    There are no available appointments today. Please advise on course of action for patient.

## 2018-05-02 LAB — CULTURE, GROUP A STREP (THRC)

## 2018-05-03 ENCOUNTER — Ambulatory Visit (HOSPITAL_COMMUNITY)
Admission: EM | Admit: 2018-05-03 | Discharge: 2018-05-03 | Disposition: A | Discharge status: Home / Self Care | Payer: PPO | Attending: Family Medicine | Admitting: Family Medicine

## 2018-05-03 ENCOUNTER — Encounter (HOSPITAL_COMMUNITY): Payer: Self-pay | Admitting: Emergency Medicine

## 2018-05-03 DIAGNOSIS — Z9049 Acquired absence of other specified parts of digestive tract: Secondary | ICD-10-CM | POA: Insufficient documentation

## 2018-05-03 DIAGNOSIS — Z79899 Other long term (current) drug therapy: Secondary | ICD-10-CM | POA: Insufficient documentation

## 2018-05-03 DIAGNOSIS — Z8673 Personal history of transient ischemic attack (TIA), and cerebral infarction without residual deficits: Secondary | ICD-10-CM | POA: Diagnosis not present

## 2018-05-03 DIAGNOSIS — Z800 Family history of malignant neoplasm of digestive organs: Secondary | ICD-10-CM | POA: Insufficient documentation

## 2018-05-03 DIAGNOSIS — Z96649 Presence of unspecified artificial hip joint: Secondary | ICD-10-CM | POA: Diagnosis not present

## 2018-05-03 DIAGNOSIS — Z803 Family history of malignant neoplasm of breast: Secondary | ICD-10-CM | POA: Diagnosis not present

## 2018-05-03 DIAGNOSIS — Z7901 Long term (current) use of anticoagulants: Secondary | ICD-10-CM | POA: Insufficient documentation

## 2018-05-03 DIAGNOSIS — Z7952 Long term (current) use of systemic steroids: Secondary | ICD-10-CM | POA: Diagnosis not present

## 2018-05-03 DIAGNOSIS — E1151 Type 2 diabetes mellitus with diabetic peripheral angiopathy without gangrene: Secondary | ICD-10-CM | POA: Diagnosis not present

## 2018-05-03 DIAGNOSIS — K5909 Other constipation: Secondary | ICD-10-CM | POA: Insufficient documentation

## 2018-05-03 DIAGNOSIS — J449 Chronic obstructive pulmonary disease, unspecified: Secondary | ICD-10-CM | POA: Diagnosis not present

## 2018-05-03 DIAGNOSIS — Z9889 Other specified postprocedural states: Secondary | ICD-10-CM | POA: Insufficient documentation

## 2018-05-03 DIAGNOSIS — E669 Obesity, unspecified: Secondary | ICD-10-CM | POA: Insufficient documentation

## 2018-05-03 DIAGNOSIS — E785 Hyperlipidemia, unspecified: Secondary | ICD-10-CM | POA: Insufficient documentation

## 2018-05-03 DIAGNOSIS — Z87891 Personal history of nicotine dependence: Secondary | ICD-10-CM | POA: Insufficient documentation

## 2018-05-03 DIAGNOSIS — I10 Essential (primary) hypertension: Secondary | ICD-10-CM | POA: Diagnosis not present

## 2018-05-03 DIAGNOSIS — R5383 Other fatigue: Secondary | ICD-10-CM | POA: Diagnosis not present

## 2018-05-03 DIAGNOSIS — R21 Rash and other nonspecific skin eruption: Secondary | ICD-10-CM | POA: Diagnosis not present

## 2018-05-03 DIAGNOSIS — Z79891 Long term (current) use of opiate analgesic: Secondary | ICD-10-CM | POA: Insufficient documentation

## 2018-05-03 DIAGNOSIS — Z8249 Family history of ischemic heart disease and other diseases of the circulatory system: Secondary | ICD-10-CM | POA: Insufficient documentation

## 2018-05-03 DIAGNOSIS — M1990 Unspecified osteoarthritis, unspecified site: Secondary | ICD-10-CM | POA: Insufficient documentation

## 2018-05-03 DIAGNOSIS — C61 Malignant neoplasm of prostate: Secondary | ICD-10-CM | POA: Insufficient documentation

## 2018-05-03 DIAGNOSIS — I480 Paroxysmal atrial fibrillation: Secondary | ICD-10-CM | POA: Diagnosis not present

## 2018-05-03 DIAGNOSIS — Z7984 Long term (current) use of oral hypoglycemic drugs: Secondary | ICD-10-CM | POA: Insufficient documentation

## 2018-05-03 DIAGNOSIS — Z808 Family history of malignant neoplasm of other organs or systems: Secondary | ICD-10-CM | POA: Insufficient documentation

## 2018-05-03 DIAGNOSIS — Z820 Family history of epilepsy and other diseases of the nervous system: Secondary | ICD-10-CM | POA: Insufficient documentation

## 2018-05-03 LAB — CBC WITH DIFFERENTIAL/PLATELET
Abs Immature Granulocytes: 0 10*3/uL (ref 0.0–0.1)
Basophils Absolute: 0 10*3/uL (ref 0.0–0.1)
Basophils Relative: 0 %
Eosinophils Absolute: 0.4 10*3/uL (ref 0.0–0.7)
Eosinophils Relative: 7 %
HEMATOCRIT: 35.1 % — AB (ref 39.0–52.0)
Hemoglobin: 11.5 g/dL — ABNORMAL LOW (ref 13.0–17.0)
Immature Granulocytes: 0 %
LYMPHS PCT: 11 %
Lymphs Abs: 0.5 10*3/uL — ABNORMAL LOW (ref 0.7–4.0)
MCH: 29.6 pg (ref 26.0–34.0)
MCHC: 32.8 g/dL (ref 30.0–36.0)
MCV: 90.2 fL (ref 78.0–100.0)
MONO ABS: 0.7 10*3/uL (ref 0.1–1.0)
Monocytes Relative: 13 %
NEUTROS PCT: 69 %
Neutro Abs: 3.4 10*3/uL (ref 1.7–7.7)
Platelets: 192 10*3/uL (ref 150–400)
RBC: 3.89 MIL/uL — AB (ref 4.22–5.81)
RDW: 13 % (ref 11.5–15.5)
WBC: 5 10*3/uL (ref 4.0–10.5)

## 2018-05-03 LAB — COMPREHENSIVE METABOLIC PANEL
ALT: 20 U/L (ref 0–44)
ANION GAP: 7 (ref 5–15)
AST: 19 U/L (ref 15–41)
Albumin: 3.6 g/dL (ref 3.5–5.0)
Alkaline Phosphatase: 63 U/L (ref 38–126)
BUN: 36 mg/dL — ABNORMAL HIGH (ref 8–23)
CO2: 25 mmol/L (ref 22–32)
Calcium: 9 mg/dL (ref 8.9–10.3)
Chloride: 104 mmol/L (ref 98–111)
Creatinine, Ser: 1.21 mg/dL (ref 0.61–1.24)
GFR, EST NON AFRICAN AMERICAN: 56 mL/min — AB (ref >60)
Glucose, Bld: 116 mg/dL — ABNORMAL HIGH (ref 70–99)
POTASSIUM: 5 mmol/L (ref 3.5–5.1)
Sodium: 136 mmol/L (ref 135–145)
TOTAL PROTEIN: 6.4 g/dL — AB (ref 6.5–8.1)
Total Bilirubin: 0.6 mg/dL (ref 0.3–1.2)

## 2018-05-03 LAB — PROTIME-INR
INR: 2.42
PROTHROMBIN TIME: 26.2 s — AB (ref 11.4–15.2)

## 2018-05-03 LAB — POCT INFECTIOUS MONO SCREEN: MONO SCREEN: NEGATIVE

## 2018-05-03 MED ORDER — PREDNISONE 10 MG (21) PO TBPK: ORAL_TABLET | Freq: Every day | ORAL | 0 refills | Status: DC

## 2018-05-03 NOTE — Discharge Instructions (Addendum)
It was nice meeting you!!  We are going to check some blood work since the rash is worsening and your having fatigue.  Mono is negative.  Your lab work is pending we will call with results.  We will try a round of prednisone to see if this helps.  Follow up with your doctor.

## 2018-05-03 NOTE — ED Provider Notes (Signed)
[place]    CSN: [id number] Arrival date & time: 05/03/18  1216     History   Chief Complaint Chief Complaint  Patient presents with  . Rash  . Hypertension    HPI Colton Miranda is a 77 y.o. male.   Pt is a 77 year old male that returns with worsening rash and fatigue. He was seen here this past Friday and dx with viral rash. The rash has gotten brighter in color. It is not painful. He reports since the rash has worsened and he feels fatigued. sts the rash was pretty itchy over the weekend but has improved today. He has been taking zyrtec for the itching. His sore throat has improved. He denies any joint pain, fevers, night sweats. Swelling in BLE. He normally has a light amount of swelling but today it is worse.  No chest pain, SOB, headaches, neck pain or dizziness . No calf pain.   Hx of PE  on coumadin.  Former smoker.   ROS per HPI       Past Medical History:  Diagnosis Date  . BPH associated with nocturia   . Chronic constipation   . COPD (chronic obstructive pulmonary disease) ([place])   . History of adenomatous polyp of colon   . History of basal cell carcinoma (BCC) of skin    post excision from trunk  . 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 20 years ago , 1999, caused by blood clot after knee scope"  DVT to PE  . History of DVT (deep vein thrombosis)    11-06-2017 per pt and wife happened lower leg approx. 1999 after knee scope  . History of pulmonary embolus (PE)    11-06-2017 from DVT in approx. 1999 per wife and pt  . Hyperlipidemia   . Hypertension    followed by pcp  . Long term (current) use of anticoagulants Coumadin-- followed by Pharmasist w/ [facility]   secondary to primary hypercoagulopathy w/ hx DVT and PE  . Myalgia and myositis, unspecified   . OA (osteoarthritis)    "all joints"  . Pinched nerve in neck    per pt causes intermittant  numbness bilateral arms and hands  . Primary hypercoagulable state ([place])   . Prostate cancer [facility]) urologist-  dr Alyson Ingles  oncologist-- dr Tammi Klippel   dx 08-28-2017  Stage T1c, Gleason 4+4, PSA 14.1, vol 42.8cc-- planned treatment external radiation and ADT  . Right bundle branch block   . Seborrheic dermatitis, unspecified   . Type 2 diabetes mellitus ([place])   . Urinary frequency   . Wears glasses     Patient Active Problem List   Diagnosis Date Noted  . PAF (paroxysmal atrial fibrillation) ([place]) 04/14/2018  . Malignant neoplasm of prostate ([place]) 10/07/2017  . Colon polyps 11/26/2016  . Influenza vaccination declined 05/28/2016  . History of fall 07/25/2015  . Palpitations 11/08/2014  . Pain of left arm 11/08/2014  . Pain in joint, lower leg 02/28/2014  . Essential hypertension 02/28/2014  . Disturbance of skin sensation 02/23/2013  . Insomnia, unspecified 02/23/2013  . Obesity   . DM (diabetes mellitus), secondary, uncontrolled, with peripheral vascular complications ([place])   . Hyperlipemia   . Urinary frequency   . Primary hypercoagulable state ([place]) 01/03/2013  . Other pulmonary embolism and infarction 01/03/2013  . Long term current use of anticoagulant therapy 01/03/2013    Past Surgical  History:  Procedure Laterality Date  . CHOLECYSTECTOMY OPEN  1981  . COLONOSCOPY  last one 01-28-2017  . GOLD SEED IMPLANT N/A 11/16/2017   Procedure: GOLD SEED IMPLANT;  Surgeon: Cleon Gustin, MD;  Location: [facility];  Service: Urology;  Laterality: N/A;  . KNEE ARTHROSCOPY Right 1990s  . PILONIDAL CYST EXCISION  1968  . PROSTATE BIOPSY  09/10/2017    (done in office)   + Pos for Prostate Cancer, Dr.MCkinzie( [facility])   . SPACE OAR INSTILLATION N/A 11/16/2017   Procedure: SPACE OAR INSTILLATION;  Surgeon: Cleon Gustin, MD;  Location: [facility];  Service: Urology;  Laterality: N/A;  . TOTAL HIP ARTHROPLASTY Right 06-18-2009   dr  Wynelle Link  [facility]       Home Medications    Prior to Admission medications   Medication Sig Start Date End Date Taking? Authorizing Provider  Bioflavonoid Products (BIOFLEX) TABS Take 1 tablet by mouth every morning.    [provider]  Blood Glucose Monitoring Suppl (ONETOUCH VERIO) w/Device KIT by Does not apply route. Check blood sugar twice daily as directed DX E11.65    [provider]  Cholecalciferol (VITAMIN D) 2000 UNITS CAPS Take 1 capsule by mouth daily.      [provider]  docusate sodium (COLACE) 100 MG capsule Take 100 mg by mouth at bedtime.    [provider]  Leuprolide Acetate, 3 Month, (LUPRON DEPOT, 30-MONTH, IM) Give injection based on recommendations of Urologist.    [provider]  lisinopril (PRINIVIL,ZESTRIL) 20 MG tablet Take 1 tablet (20 mg total) by mouth daily. 03/30/18   Lauree Chandler, NP  Melatonin 5 MG CAPS Take 1 capsule (5 mg total) by mouth at bedtime. 03/27/17   Lauree Chandler, NP  metFORMIN (GLUCOPHAGE) 500 MG tablet TAKE 1 TABLET BY MOUTH ONCE DAILY WITH BREAKFAST 01/25/18   Lauree Chandler, NP  nystatin (MYCOSTATIN/NYSTOP) powder Apply 100,000 g topically daily as needed. Apply to groin area for yeast/fungal infection 02/17/18   Lauree Chandler, NP  [facility] DELICA LANCETS 63A MISC USE 1  TO CHECK GLUCOSE TWICE DAILY 03/02/18   Lauree Chandler, NP  [facility] VERIO test strip USE 1 STRIP TO CHECK GLUCOSE TWICE DAILY AS DIRECTED 03/02/18   Lauree Chandler, NP  predniSONE (STERAPRED UNI-PAK 21 TAB) 10 MG (21) TBPK tablet Take by mouth daily. 6 tabs  for 2 days,  5 tabs for 2 days,  4 tabs for 2 days, 3 tabs for 2 days, 2 tabs for 2 days,  1 tab  for 2 days 05/03/18   Loura Halt A, NP  simvastatin (ZOCOR) 20 MG tablet TAKE 1 TABLET BY MOUTH ONCE DAILY FOR CHOLESTEROL 03/02/18   Lauree Chandler, NP  traMADol (ULTRAM) 50 MG tablet Take one to two tablets by mouth every 6 hours as needed for pain. Do not  take more than 6 tablets in 24 hours 03/15/18   Lauree Chandler, NP  warfarin (COUMADIN) 10 MG tablet TAKE 1 TABLET BY MOUTH ONCE DAILY ALONG  WITH  A  5  MG  TABLET  FOR  A  TOTAL  OF  15  MG  DAILY 04/14/18   Hendricks Limes, MD  warfarin (COUMADIN) 5 MG tablet Take 5 mg along with 10 mg for a total of 3m daily except 7.555mwith 1075mn Mondays, Wednesday and Fridays and Saturdays for a total of 17.5mg52m  [provider]    Family History Family History  Problem Relation Age of Onset  . Cancer Mother 1       colon , breast  . Colon cancer Mother   . Heart attack Daughter        2008  . Parkinson's disease Sister   . Cancer Sister        breast  . Cancer Maternal Uncle        melanoma  . Cancer Paternal Uncle     Social History Social History   Tobacco Use  . Smoking status: Former Smoker    Years: 50.00    Types: Cigarettes    Last attempt to quit: 01/03/2009    Years since quitting: 9.3  . Smokeless tobacco: Never Used  Substance Use Topics  . Alcohol use: Yes    Alcohol/week: 3.0 standard drinks    Types: 3 Cans of beer per week  . Drug use: No     Allergies   Patient has no known allergies.   Review of Systems Review of Systems   Physical Exam Triage Vital Signs ED Triage Vitals [05/03/18 1259]  Enc Vitals Group     BP (!) 137/53     Pulse Rate 65     Resp 18     Temp 97.6 F (36.4 C)     Temp Source Oral     SpO2 100 %     Weight      Height      Head Circumference      Peak Flow      Pain Score 3     Pain Loc      Pain Edu?      Excl. in [place]?    No data found.  Updated Vital Signs BP (!) 137/53 (BP Location: Left Arm)   Pulse 65   Temp 97.6 F (36.4 C) (Oral)   Resp 18   SpO2 100%   Visual Acuity Right Eye Distance:   Left Eye Distance:   Bilateral Distance:    Right Eye Near:   Left Eye Near:    Bilateral Near:     Physical Exam  Constitutional: He is oriented to person, place, and time. He appears  well-developed and well-nourished.  HENT:  Head: Normocephalic and atraumatic.  Eyes: Pupils are equal, round, and reactive to light. Conjunctivae are normal.  Neck: Normal range of motion.  Cardiovascular:  2+ pitting edema in feet and ankles.   Pulmonary/Chest: Effort normal.  Musculoskeletal:  No calf tenderness.   Lymphadenopathy:    He has no cervical adenopathy.  Neurological: He is alert and oriented to person, place, and time.  Skin: Skin is warm, dry and intact. Petechiae and rash noted. Rash is macular. There is erythema.     Macular blanchable rash to entire body except for face and scalp. Petechial rash, non blanchable,  to BLE midway down the lower leg.    Scratches to upper arms from itching. Some parts of rash urticarial.   Psychiatric: He has a normal mood and affect.  Nursing note and vitals reviewed.    UC Treatments / Results  Labs (all labs ordered are listed, but only abnormal results are displayed) Labs Reviewed  CBC WITH DIFFERENTIAL/PLATELET - Abnormal; Notable for the following components:      Result Value   RBC 3.89 (*)    Hemoglobin 11.5 (*)    HCT 35.1 (*)    Lymphs Abs 0.5 (*)  All other components within normal limits  COMPREHENSIVE METABOLIC PANEL - Abnormal; Notable for the following components:   Glucose, Bld 116 (*)    BUN 36 (*)    Total Protein 6.4 (*)    GFR calc non Af Amer 56 (*)    All other components within normal limits  PROTIME-INR - Abnormal; Notable for the following components:   Prothrombin Time 26.2 (*)    All other components within normal limits  POCT INFECTIOUS MONO SCREEN    EKG None  Radiology No results found.  Procedures Procedures (including critical care time)  Medications Ordered in UC Medications - No data to display  Initial Impression / Assessment and Plan / UC Course  I have reviewed the triage vital signs and the nursing notes.  Pertinent labs & imaging results that were available during  my care of the patient were reviewed by me and considered in my medical decision making (see chart for details).    Strep and culture negative from previous visit.  Rash was initially not petechial on lower extremities. Rash has gotten worse and itchy all weekend despite zyrtec. Plus increased edema in BLE.  Will get mono, CBC, CMP and PT/INR to rule out vasculitis or any bleeding issues. . Will go ahead and treat with prednisone to see if this helps.   Pt agreeable to plan.   Mono negative CBC- RBC 3.89, hgb 11.5 HCT 35.1 CMP- BUN- 36, GFR-56 PT- 26.2  Nothing medically significant on lab work. Pt called and given the results over the phone. Told to follow up with PCP if symptoms don't improve with the prednisone.   Final Clinical Impressions(s) / UC Diagnoses   Final diagnoses:  Rash     Discharge Instructions     It was nice meeting you!!  We are going to check some blood work since the rash is worsening and your having fatigue.  Mono is negative.  Your lab work is pending we will call with results.  We will try a round of prednisone to see if this helps.  Follow up with your doctor.    ED Prescriptions    Medication Sig Dispense Auth. Provider   predniSONE (STERAPRED UNI-PAK 21 TAB) 10 MG (21) TBPK tablet Take by mouth daily. 6 tabs  for 2 days,  5 tabs for 2 days,  4 tabs for 2 days, 3 tabs for 2 days, 2 tabs for 2 days,  1 tab  for 2 days 42 tablet Avree Szczygiel A, NP     Controlled Substance Prescriptions [place] Controlled Substance Registry consulted? Not Applicable   Orvan July, NP 05/04/18 1120

## 2018-05-03 NOTE — ED Triage Notes (Signed)
Pt here for worsening rash and htn; pt seen here Friday for same

## 2018-05-13 DIAGNOSIS — I10 Essential (primary) hypertension: Secondary | ICD-10-CM | POA: Diagnosis not present

## 2018-05-13 DIAGNOSIS — Z7901 Long term (current) use of anticoagulants: Secondary | ICD-10-CM | POA: Diagnosis not present

## 2018-05-13 DIAGNOSIS — Z1389 Encounter for screening for other disorder: Secondary | ICD-10-CM | POA: Diagnosis not present

## 2018-05-13 DIAGNOSIS — Z86010 Personal history of colonic polyps: Secondary | ICD-10-CM | POA: Diagnosis not present

## 2018-05-13 DIAGNOSIS — Z6841 Body Mass Index (BMI) 40.0 and over, adult: Secondary | ICD-10-CM | POA: Diagnosis not present

## 2018-05-13 DIAGNOSIS — Z86718 Personal history of other venous thrombosis and embolism: Secondary | ICD-10-CM | POA: Diagnosis not present

## 2018-05-13 DIAGNOSIS — C61 Malignant neoplasm of prostate: Secondary | ICD-10-CM | POA: Diagnosis not present

## 2018-05-13 DIAGNOSIS — E119 Type 2 diabetes mellitus without complications: Secondary | ICD-10-CM | POA: Diagnosis not present

## 2018-05-13 DIAGNOSIS — I480 Paroxysmal atrial fibrillation: Secondary | ICD-10-CM | POA: Diagnosis not present

## 2018-05-13 DIAGNOSIS — I69322 Dysarthria following cerebral infarction: Secondary | ICD-10-CM | POA: Diagnosis not present

## 2018-05-13 DIAGNOSIS — E7849 Other hyperlipidemia: Secondary | ICD-10-CM | POA: Diagnosis not present

## 2018-05-17 ENCOUNTER — Other Ambulatory Visit: Payer: PPO

## 2018-06-03 DIAGNOSIS — Z86718 Personal history of other venous thrombosis and embolism: Secondary | ICD-10-CM | POA: Diagnosis not present

## 2018-06-03 DIAGNOSIS — Z7901 Long term (current) use of anticoagulants: Secondary | ICD-10-CM | POA: Diagnosis not present

## 2018-06-03 DIAGNOSIS — I480 Paroxysmal atrial fibrillation: Secondary | ICD-10-CM | POA: Diagnosis not present

## 2018-06-22 DIAGNOSIS — C61 Malignant neoplasm of prostate: Secondary | ICD-10-CM | POA: Diagnosis not present

## 2018-06-22 DIAGNOSIS — Z5111 Encounter for antineoplastic chemotherapy: Secondary | ICD-10-CM | POA: Diagnosis not present

## 2018-06-29 ENCOUNTER — Other Ambulatory Visit: Payer: PPO

## 2018-07-01 ENCOUNTER — Ambulatory Visit: Payer: PPO | Admitting: Nurse Practitioner

## 2018-07-06 DIAGNOSIS — Z7901 Long term (current) use of anticoagulants: Secondary | ICD-10-CM | POA: Diagnosis not present

## 2018-07-06 DIAGNOSIS — E1169 Type 2 diabetes mellitus with other specified complication: Secondary | ICD-10-CM | POA: Diagnosis not present

## 2018-07-06 DIAGNOSIS — I480 Paroxysmal atrial fibrillation: Secondary | ICD-10-CM | POA: Diagnosis not present

## 2018-07-12 ENCOUNTER — Other Ambulatory Visit: Payer: Self-pay | Admitting: Urology

## 2018-07-16 ENCOUNTER — Other Ambulatory Visit: Payer: Self-pay | Admitting: Urology

## 2018-07-16 ENCOUNTER — Telehealth: Payer: Self-pay | Admitting: Radiation Oncology

## 2018-07-16 MED ORDER — ALFUZOSIN HCL ER 10 MG PO TB24: 10.0000 mg | ORAL_TABLET | Freq: Every day | ORAL | 5 refills | Status: DC

## 2018-07-16 NOTE — Progress Notes (Signed)
Patient's wife phoned requesting a refill of his afluzosin 10 mg. Explained Ashlyn Bruning, PA-C would refill the medication this time but next refill would need to come from Dr. Alyson Ingles since the patient is no longer under our care. She verbalized understanding and expressed appreciation for the help.

## 2018-08-04 DIAGNOSIS — I480 Paroxysmal atrial fibrillation: Secondary | ICD-10-CM | POA: Diagnosis not present

## 2018-08-04 DIAGNOSIS — Z7901 Long term (current) use of anticoagulants: Secondary | ICD-10-CM | POA: Diagnosis not present

## 2018-08-04 DIAGNOSIS — Z86718 Personal history of other venous thrombosis and embolism: Secondary | ICD-10-CM | POA: Diagnosis not present

## 2018-08-16 ENCOUNTER — Other Ambulatory Visit: Payer: Self-pay | Admitting: Nurse Practitioner

## 2018-08-16 DIAGNOSIS — E785 Hyperlipidemia, unspecified: Secondary | ICD-10-CM

## 2018-08-17 ENCOUNTER — Other Ambulatory Visit: Payer: Self-pay | Admitting: Nurse Practitioner

## 2018-08-17 DIAGNOSIS — E785 Hyperlipidemia, unspecified: Secondary | ICD-10-CM

## 2018-08-18 DIAGNOSIS — Z6841 Body Mass Index (BMI) 40.0 and over, adult: Secondary | ICD-10-CM | POA: Diagnosis not present

## 2018-08-18 DIAGNOSIS — I10 Essential (primary) hypertension: Secondary | ICD-10-CM | POA: Diagnosis not present

## 2018-08-18 DIAGNOSIS — H6093 Unspecified otitis externa, bilateral: Secondary | ICD-10-CM | POA: Diagnosis not present

## 2018-08-18 DIAGNOSIS — H6123 Impacted cerumen, bilateral: Secondary | ICD-10-CM | POA: Diagnosis not present

## 2018-09-08 DIAGNOSIS — I480 Paroxysmal atrial fibrillation: Secondary | ICD-10-CM | POA: Diagnosis not present

## 2018-09-08 DIAGNOSIS — Z86718 Personal history of other venous thrombosis and embolism: Secondary | ICD-10-CM | POA: Diagnosis not present

## 2018-09-08 DIAGNOSIS — Z7901 Long term (current) use of anticoagulants: Secondary | ICD-10-CM | POA: Diagnosis not present

## 2018-09-17 DIAGNOSIS — I10 Essential (primary) hypertension: Secondary | ICD-10-CM | POA: Diagnosis not present

## 2018-09-17 DIAGNOSIS — Z0189 Encounter for other specified special examinations: Secondary | ICD-10-CM | POA: Diagnosis not present

## 2018-09-17 DIAGNOSIS — I452 Bifascicular block: Secondary | ICD-10-CM | POA: Diagnosis not present

## 2018-09-17 DIAGNOSIS — R001 Bradycardia, unspecified: Secondary | ICD-10-CM | POA: Diagnosis not present

## 2018-09-21 DIAGNOSIS — M25562 Pain in left knee: Secondary | ICD-10-CM | POA: Diagnosis not present

## 2018-09-29 DIAGNOSIS — M1712 Unilateral primary osteoarthritis, left knee: Secondary | ICD-10-CM | POA: Diagnosis not present

## 2018-10-11 ENCOUNTER — Other Ambulatory Visit: Payer: Self-pay | Admitting: Internal Medicine

## 2018-10-11 DIAGNOSIS — Z7901 Long term (current) use of anticoagulants: Secondary | ICD-10-CM

## 2018-10-12 DIAGNOSIS — I480 Paroxysmal atrial fibrillation: Secondary | ICD-10-CM | POA: Diagnosis not present

## 2018-10-12 DIAGNOSIS — Z7901 Long term (current) use of anticoagulants: Secondary | ICD-10-CM | POA: Diagnosis not present

## 2018-10-12 DIAGNOSIS — Z86718 Personal history of other venous thrombosis and embolism: Secondary | ICD-10-CM | POA: Diagnosis not present

## 2018-10-12 DIAGNOSIS — E1169 Type 2 diabetes mellitus with other specified complication: Secondary | ICD-10-CM | POA: Diagnosis not present

## 2018-10-13 NOTE — Telephone Encounter (Signed)
Please verify PCP/Rxer

## 2018-10-29 DIAGNOSIS — M1712 Unilateral primary osteoarthritis, left knee: Secondary | ICD-10-CM | POA: Diagnosis not present

## 2018-11-09 DIAGNOSIS — M1712 Unilateral primary osteoarthritis, left knee: Secondary | ICD-10-CM | POA: Diagnosis not present

## 2018-11-10 DIAGNOSIS — E7849 Other hyperlipidemia: Secondary | ICD-10-CM | POA: Diagnosis not present

## 2018-11-10 DIAGNOSIS — I10 Essential (primary) hypertension: Secondary | ICD-10-CM | POA: Diagnosis not present

## 2018-11-10 DIAGNOSIS — E1169 Type 2 diabetes mellitus with other specified complication: Secondary | ICD-10-CM | POA: Diagnosis not present

## 2018-11-10 DIAGNOSIS — R82998 Other abnormal findings in urine: Secondary | ICD-10-CM | POA: Diagnosis not present

## 2018-11-11 DIAGNOSIS — I480 Paroxysmal atrial fibrillation: Secondary | ICD-10-CM | POA: Diagnosis not present

## 2018-11-11 DIAGNOSIS — Z86718 Personal history of other venous thrombosis and embolism: Secondary | ICD-10-CM | POA: Diagnosis not present

## 2018-11-11 DIAGNOSIS — Z7901 Long term (current) use of anticoagulants: Secondary | ICD-10-CM | POA: Diagnosis not present

## 2018-11-16 DIAGNOSIS — M1712 Unilateral primary osteoarthritis, left knee: Secondary | ICD-10-CM | POA: Diagnosis not present

## 2018-11-17 DIAGNOSIS — E1169 Type 2 diabetes mellitus with other specified complication: Secondary | ICD-10-CM | POA: Diagnosis not present

## 2018-11-17 DIAGNOSIS — E7849 Other hyperlipidemia: Secondary | ICD-10-CM | POA: Diagnosis not present

## 2018-11-17 DIAGNOSIS — M545 Low back pain: Secondary | ICD-10-CM | POA: Diagnosis not present

## 2018-11-17 DIAGNOSIS — I480 Paroxysmal atrial fibrillation: Secondary | ICD-10-CM | POA: Diagnosis not present

## 2018-11-17 DIAGNOSIS — Z7901 Long term (current) use of anticoagulants: Secondary | ICD-10-CM | POA: Diagnosis not present

## 2018-11-17 DIAGNOSIS — Z0000 Encounter for general adult medical examination without abnormal findings: Secondary | ICD-10-CM | POA: Diagnosis not present

## 2018-11-17 DIAGNOSIS — R7989 Other specified abnormal findings of blood chemistry: Secondary | ICD-10-CM | POA: Diagnosis not present

## 2018-11-17 DIAGNOSIS — I69322 Dysarthria following cerebral infarction: Secondary | ICD-10-CM | POA: Diagnosis not present

## 2018-11-17 DIAGNOSIS — C61 Malignant neoplasm of prostate: Secondary | ICD-10-CM | POA: Diagnosis not present

## 2018-11-17 DIAGNOSIS — F17201 Nicotine dependence, unspecified, in remission: Secondary | ICD-10-CM | POA: Diagnosis not present

## 2018-11-17 DIAGNOSIS — I10 Essential (primary) hypertension: Secondary | ICD-10-CM | POA: Diagnosis not present

## 2018-11-17 DIAGNOSIS — Z1331 Encounter for screening for depression: Secondary | ICD-10-CM | POA: Diagnosis not present

## 2018-11-17 DIAGNOSIS — D649 Anemia, unspecified: Secondary | ICD-10-CM | POA: Diagnosis not present

## 2018-11-17 DIAGNOSIS — Z86718 Personal history of other venous thrombosis and embolism: Secondary | ICD-10-CM | POA: Diagnosis not present

## 2018-11-19 ENCOUNTER — Other Ambulatory Visit: Payer: Self-pay | Admitting: Internal Medicine

## 2018-11-19 DIAGNOSIS — F17201 Nicotine dependence, unspecified, in remission: Secondary | ICD-10-CM

## 2018-11-22 DIAGNOSIS — Z1212 Encounter for screening for malignant neoplasm of rectum: Secondary | ICD-10-CM | POA: Diagnosis not present

## 2018-11-22 DIAGNOSIS — Z13820 Encounter for screening for osteoporosis: Secondary | ICD-10-CM | POA: Diagnosis not present

## 2018-11-23 DIAGNOSIS — M1712 Unilateral primary osteoarthritis, left knee: Secondary | ICD-10-CM | POA: Diagnosis not present

## 2018-12-06 ENCOUNTER — Ambulatory Visit: Payer: PPO

## 2018-12-15 DIAGNOSIS — Z86718 Personal history of other venous thrombosis and embolism: Secondary | ICD-10-CM | POA: Diagnosis not present

## 2018-12-15 DIAGNOSIS — Z7901 Long term (current) use of anticoagulants: Secondary | ICD-10-CM | POA: Diagnosis not present

## 2018-12-15 DIAGNOSIS — I480 Paroxysmal atrial fibrillation: Secondary | ICD-10-CM | POA: Diagnosis not present

## 2019-01-06 DIAGNOSIS — C61 Malignant neoplasm of prostate: Secondary | ICD-10-CM | POA: Diagnosis not present

## 2019-01-06 DIAGNOSIS — R351 Nocturia: Secondary | ICD-10-CM | POA: Diagnosis not present

## 2019-01-12 DIAGNOSIS — M1712 Unilateral primary osteoarthritis, left knee: Secondary | ICD-10-CM | POA: Diagnosis not present

## 2019-01-18 DIAGNOSIS — Z86718 Personal history of other venous thrombosis and embolism: Secondary | ICD-10-CM | POA: Diagnosis not present

## 2019-01-18 DIAGNOSIS — Z7901 Long term (current) use of anticoagulants: Secondary | ICD-10-CM | POA: Diagnosis not present

## 2019-01-18 DIAGNOSIS — I480 Paroxysmal atrial fibrillation: Secondary | ICD-10-CM | POA: Diagnosis not present

## 2019-02-01 DIAGNOSIS — Z7901 Long term (current) use of anticoagulants: Secondary | ICD-10-CM | POA: Diagnosis not present

## 2019-02-01 DIAGNOSIS — E1169 Type 2 diabetes mellitus with other specified complication: Secondary | ICD-10-CM | POA: Diagnosis not present

## 2019-02-01 DIAGNOSIS — I480 Paroxysmal atrial fibrillation: Secondary | ICD-10-CM | POA: Diagnosis not present

## 2019-02-01 DIAGNOSIS — Z86718 Personal history of other venous thrombosis and embolism: Secondary | ICD-10-CM | POA: Diagnosis not present

## 2019-02-16 DIAGNOSIS — Z85828 Personal history of other malignant neoplasm of skin: Secondary | ICD-10-CM | POA: Diagnosis not present

## 2019-02-16 DIAGNOSIS — L821 Other seborrheic keratosis: Secondary | ICD-10-CM | POA: Diagnosis not present

## 2019-02-23 ENCOUNTER — Ambulatory Visit: Payer: PPO

## 2019-03-08 DIAGNOSIS — Z86718 Personal history of other venous thrombosis and embolism: Secondary | ICD-10-CM | POA: Diagnosis not present

## 2019-03-08 DIAGNOSIS — Z7901 Long term (current) use of anticoagulants: Secondary | ICD-10-CM | POA: Diagnosis not present

## 2019-03-08 DIAGNOSIS — I480 Paroxysmal atrial fibrillation: Secondary | ICD-10-CM | POA: Diagnosis not present

## 2019-03-22 DIAGNOSIS — H524 Presbyopia: Secondary | ICD-10-CM | POA: Diagnosis not present

## 2019-03-22 DIAGNOSIS — H2513 Age-related nuclear cataract, bilateral: Secondary | ICD-10-CM | POA: Diagnosis not present

## 2019-03-22 DIAGNOSIS — H04123 Dry eye syndrome of bilateral lacrimal glands: Secondary | ICD-10-CM | POA: Diagnosis not present

## 2019-03-22 DIAGNOSIS — H52203 Unspecified astigmatism, bilateral: Secondary | ICD-10-CM | POA: Diagnosis not present

## 2019-03-22 DIAGNOSIS — E119 Type 2 diabetes mellitus without complications: Secondary | ICD-10-CM | POA: Diagnosis not present

## 2019-03-22 DIAGNOSIS — H5213 Myopia, bilateral: Secondary | ICD-10-CM | POA: Diagnosis not present

## 2019-04-05 DIAGNOSIS — Z86718 Personal history of other venous thrombosis and embolism: Secondary | ICD-10-CM | POA: Diagnosis not present

## 2019-04-05 DIAGNOSIS — Z7901 Long term (current) use of anticoagulants: Secondary | ICD-10-CM | POA: Diagnosis not present

## 2019-04-05 DIAGNOSIS — E1169 Type 2 diabetes mellitus with other specified complication: Secondary | ICD-10-CM | POA: Diagnosis not present

## 2019-04-05 DIAGNOSIS — I480 Paroxysmal atrial fibrillation: Secondary | ICD-10-CM | POA: Diagnosis not present

## 2019-04-22 DIAGNOSIS — M1712 Unilateral primary osteoarthritis, left knee: Secondary | ICD-10-CM | POA: Diagnosis not present

## 2019-05-17 DIAGNOSIS — Z7901 Long term (current) use of anticoagulants: Secondary | ICD-10-CM | POA: Diagnosis not present

## 2019-05-17 DIAGNOSIS — Z86718 Personal history of other venous thrombosis and embolism: Secondary | ICD-10-CM | POA: Diagnosis not present

## 2019-05-17 DIAGNOSIS — I480 Paroxysmal atrial fibrillation: Secondary | ICD-10-CM | POA: Diagnosis not present

## 2019-05-23 DIAGNOSIS — D649 Anemia, unspecified: Secondary | ICD-10-CM | POA: Diagnosis not present

## 2019-05-23 DIAGNOSIS — M545 Low back pain: Secondary | ICD-10-CM | POA: Diagnosis not present

## 2019-05-23 DIAGNOSIS — Z86718 Personal history of other venous thrombosis and embolism: Secondary | ICD-10-CM | POA: Diagnosis not present

## 2019-05-23 DIAGNOSIS — E785 Hyperlipidemia, unspecified: Secondary | ICD-10-CM | POA: Diagnosis not present

## 2019-05-23 DIAGNOSIS — Z7901 Long term (current) use of anticoagulants: Secondary | ICD-10-CM | POA: Diagnosis not present

## 2019-05-23 DIAGNOSIS — I10 Essential (primary) hypertension: Secondary | ICD-10-CM | POA: Diagnosis not present

## 2019-05-23 DIAGNOSIS — E1169 Type 2 diabetes mellitus with other specified complication: Secondary | ICD-10-CM | POA: Diagnosis not present

## 2019-05-23 DIAGNOSIS — I480 Paroxysmal atrial fibrillation: Secondary | ICD-10-CM | POA: Diagnosis not present

## 2019-06-01 DIAGNOSIS — M1712 Unilateral primary osteoarthritis, left knee: Secondary | ICD-10-CM | POA: Diagnosis not present

## 2019-06-02 DIAGNOSIS — R600 Localized edema: Secondary | ICD-10-CM | POA: Diagnosis not present

## 2019-06-02 DIAGNOSIS — M25562 Pain in left knee: Secondary | ICD-10-CM | POA: Diagnosis not present

## 2019-06-02 DIAGNOSIS — Z7901 Long term (current) use of anticoagulants: Secondary | ICD-10-CM | POA: Diagnosis not present

## 2019-06-02 DIAGNOSIS — I480 Paroxysmal atrial fibrillation: Secondary | ICD-10-CM | POA: Diagnosis not present

## 2019-06-02 DIAGNOSIS — R0609 Other forms of dyspnea: Secondary | ICD-10-CM | POA: Diagnosis not present

## 2019-06-09 DIAGNOSIS — M1712 Unilateral primary osteoarthritis, left knee: Secondary | ICD-10-CM | POA: Diagnosis not present

## 2019-06-15 DIAGNOSIS — M1712 Unilateral primary osteoarthritis, left knee: Secondary | ICD-10-CM | POA: Diagnosis not present

## 2019-06-21 ENCOUNTER — Emergency Department (HOSPITAL_COMMUNITY)
Admission: EM | Admit: 2019-06-21 | Discharge: 2019-06-22 | Disposition: A | Discharge status: Home / Self Care | Payer: PPO | Attending: Emergency Medicine | Admitting: Emergency Medicine

## 2019-06-21 ENCOUNTER — Other Ambulatory Visit: Payer: Self-pay

## 2019-06-21 ENCOUNTER — Encounter (HOSPITAL_COMMUNITY): Payer: Self-pay | Admitting: *Deleted

## 2019-06-21 ENCOUNTER — Emergency Department (HOSPITAL_COMMUNITY): Payer: PPO

## 2019-06-21 DIAGNOSIS — E119 Type 2 diabetes mellitus without complications: Secondary | ICD-10-CM | POA: Diagnosis not present

## 2019-06-21 DIAGNOSIS — Z8673 Personal history of transient ischemic attack (TIA), and cerebral infarction without residual deficits: Secondary | ICD-10-CM | POA: Diagnosis not present

## 2019-06-21 DIAGNOSIS — Z79899 Other long term (current) drug therapy: Secondary | ICD-10-CM | POA: Insufficient documentation

## 2019-06-21 DIAGNOSIS — J449 Chronic obstructive pulmonary disease, unspecified: Secondary | ICD-10-CM | POA: Insufficient documentation

## 2019-06-21 DIAGNOSIS — Z85038 Personal history of other malignant neoplasm of large intestine: Secondary | ICD-10-CM | POA: Diagnosis not present

## 2019-06-21 DIAGNOSIS — Z85828 Personal history of other malignant neoplasm of skin: Secondary | ICD-10-CM | POA: Insufficient documentation

## 2019-06-21 DIAGNOSIS — H538 Other visual disturbances: Secondary | ICD-10-CM | POA: Diagnosis not present

## 2019-06-21 DIAGNOSIS — I10 Essential (primary) hypertension: Secondary | ICD-10-CM

## 2019-06-21 DIAGNOSIS — Z87891 Personal history of nicotine dependence: Secondary | ICD-10-CM | POA: Diagnosis not present

## 2019-06-21 DIAGNOSIS — Z7901 Long term (current) use of anticoagulants: Secondary | ICD-10-CM | POA: Diagnosis not present

## 2019-06-21 LAB — DIFFERENTIAL
Abs Immature Granulocytes: 0.02 10*3/uL (ref 0.00–0.07)
Basophils Absolute: 0 10*3/uL (ref 0.0–0.1)
Basophils Relative: 1 %
Eosinophils Absolute: 0.1 10*3/uL (ref 0.0–0.5)
Eosinophils Relative: 3 %
Immature Granulocytes: 0 %
Lymphocytes Relative: 22 %
Lymphs Abs: 1 10*3/uL (ref 0.7–4.0)
Monocytes Absolute: 0.5 10*3/uL (ref 0.1–1.0)
Monocytes Relative: 10 %
Neutro Abs: 3 10*3/uL (ref 1.7–7.7)
Neutrophils Relative %: 64 %

## 2019-06-21 LAB — I-STAT CHEM 8, ED
BUN: 22 mg/dL (ref 8–23)
Calcium, Ion: 1.19 mmol/L (ref 1.15–1.40)
Chloride: 101 mmol/L (ref 98–111)
Creatinine, Ser: 0.9 mg/dL (ref 0.61–1.24)
Glucose, Bld: 118 mg/dL — ABNORMAL HIGH (ref 70–99)
HCT: 37 % — ABNORMAL LOW (ref 39.0–52.0)
Hemoglobin: 12.6 g/dL — ABNORMAL LOW (ref 13.0–17.0)
Potassium: 4.4 mmol/L (ref 3.5–5.1)
Sodium: 137 mmol/L (ref 135–145)
TCO2: 27 mmol/L (ref 22–32)

## 2019-06-21 LAB — COMPREHENSIVE METABOLIC PANEL
ALT: 23 U/L (ref 0–44)
AST: 21 U/L (ref 15–41)
Albumin: 3.7 g/dL (ref 3.5–5.0)
Alkaline Phosphatase: 75 U/L (ref 38–126)
Anion gap: 10 (ref 5–15)
BUN: 18 mg/dL (ref 8–23)
CO2: 24 mmol/L (ref 22–32)
Calcium: 9.5 mg/dL (ref 8.9–10.3)
Chloride: 101 mmol/L (ref 98–111)
Creatinine, Ser: 0.98 mg/dL (ref 0.61–1.24)
GFR calc Af Amer: >60 mL/min (ref >60)
GFR calc non Af Amer: >60 mL/min (ref >60)
Glucose, Bld: 125 mg/dL — ABNORMAL HIGH (ref 70–99)
Potassium: 4.2 mmol/L (ref 3.5–5.1)
Sodium: 135 mmol/L (ref 135–145)
Total Bilirubin: 0.6 mg/dL (ref 0.3–1.2)
Total Protein: 6.7 g/dL (ref 6.5–8.1)

## 2019-06-21 LAB — CBC
HCT: 36.8 % — ABNORMAL LOW (ref 39.0–52.0)
Hemoglobin: 12.3 g/dL — ABNORMAL LOW (ref 13.0–17.0)
MCH: 29.1 pg (ref 26.0–34.0)
MCHC: 33.4 g/dL (ref 30.0–36.0)
MCV: 87 fL (ref 80.0–100.0)
Platelets: 208 10*3/uL (ref 150–400)
RBC: 4.23 MIL/uL (ref 4.22–5.81)
RDW: 14.2 % (ref 11.5–15.5)
WBC: 4.7 10*3/uL (ref 4.0–10.5)
nRBC: 0 % (ref 0.0–0.2)

## 2019-06-21 LAB — PROTIME-INR
INR: 2.2 — ABNORMAL HIGH (ref 0.8–1.2)
Prothrombin Time: 23.7 seconds — ABNORMAL HIGH (ref 11.4–15.2)

## 2019-06-21 LAB — APTT: aPTT: 38 seconds — ABNORMAL HIGH (ref 24–36)

## 2019-06-21 LAB — CBG MONITORING, ED: Glucose-Capillary: 115 mg/dL — ABNORMAL HIGH (ref 70–99)

## 2019-06-21 MED ORDER — SODIUM CHLORIDE 0.9% FLUSH: 3.0000 mL | Freq: Once | INTRAVENOUS | Status: DC

## 2019-06-21 NOTE — ED Notes (Signed)
Pt ambulated to bathroom with cane and 1 assist successfully

## 2019-06-21 NOTE — ED Notes (Signed)
Spoke to Dr.Wentz concerning symptoms, stroke workup to be completed without activation at this time

## 2019-06-21 NOTE — ED Provider Notes (Signed)
TIME SEEN: 11:19 PM  CHIEF COMPLAINT: Hypertension, blurry vision  HPI: Patient is a 78 year old male with history of COPD, hypertension, hyperlipidemia, type 2 diabetes, previous stroke, DVT on Coumadin who presents to the emergency department with complaints of blurry vision that started while at rest tonight around 7 PM.  No vision loss.  States he did see floaters in both eyes when he was getting his CT scan done here in the emergency department but otherwise no flashers or floaters.  No headache or head injury.  No numbness or focal weakness.  Blurry vision improved approximately 30 minutes ago.  States blurred vision involve both eyes.  No diplopia.  Blood pressure at home was elevated in the 220s/150s.  He is on lisinopril 10 mg daily and has not missed any doses of his medication.  No chest pain, shortness of breath, fevers, cough, vomiting or diarrhea.  ROS: See HPI Constitutional: no fever  Eyes: no drainage  ENT: no runny nose   Cardiovascular:  no chest pain  Resp: no SOB  GI: no vomiting GU: no dysuria Integumentary: no rash  Allergy: no hives  Musculoskeletal: no leg swelling  Neurological: no slurred speech ROS otherwise negative  PAST MEDICAL HISTORY/PAST SURGICAL HISTORY:  Past Medical History:  Diagnosis Date  . BPH associated with nocturia   . Chronic constipation   . COPD (chronic obstructive pulmonary disease) ([place])   . History of adenomatous polyp of colon   . History of basal cell carcinoma (BCC) of skin    post excision from trunk  . 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 20 years ago , 1999, caused by blood clot after knee scope"  DVT to PE  . History of DVT (deep vein thrombosis)    11-06-2017 per pt and wife happened lower leg approx. 1999 after knee scope  . History of pulmonary embolus (PE)    11-06-2017 from DVT in approx. 1999 per wife and pt  . Hyperlipidemia   .  Hypertension    followed by pcp  . Long term (current) use of anticoagulants Coumadin-- followed by Pharmasist w/ [facility]   secondary to primary hypercoagulopathy w/ hx DVT and PE  . Myalgia and myositis, unspecified   . OA (osteoarthritis)    "all joints"  . Pinched nerve in neck    per pt causes intermittant numbness bilateral arms and hands  . Primary hypercoagulable state ([place])   . Prostate cancer [facility]) urologist-  dr Alyson Ingles  oncologist-- dr Tammi Klippel   dx 08-28-2017  Stage T1c, Gleason 4+4, PSA 14.1, vol 42.8cc-- planned treatment external radiation and ADT  . Right bundle branch block   . Seborrheic dermatitis, unspecified   . Type 2 diabetes mellitus ([place])   . Urinary frequency   . Wears glasses     MEDICATIONS:  Prior to Admission medications   Medication Sig Start Date End Date Taking? Authorizing Provider  alfuzosin (UROXATRAL) 10 MG 24 hr tablet Take 1 tablet (10 mg total) by mouth daily with breakfast. 07/16/18  Yes Bruning, Ashlyn, PA-C  Boswellia-Glucosamine-Vit D (OSTEO BI-FLEX ONE PER DAY) TABS Take 1 tablet by mouth daily.    Yes [provider]  Cholecalciferol (VITAMIN D3) 50 MCG (2000 UT) TABS Take 2,000 Units by mouth daily after supper.   Yes [provider]  Dextran 70-Hypromellose (ARTIFICIAL TEARS PF OP) Place 1-2 drops into both eyes as needed (for dryness).  Yes [provider]  docusate sodium (COLACE) 100 MG capsule Take 100 mg by mouth at bedtime.   Yes [provider]  lisinopril (PRINIVIL,ZESTRIL) 20 MG tablet Take 1 tablet (20 mg total) by mouth daily. 03/30/18  Yes Lauree Chandler, NP  Melatonin 5 MG CAPS Take 1 capsule (5 mg total) by mouth at bedtime. 03/27/17  Yes Lauree Chandler, NP  metFORMIN (GLUCOPHAGE) 500 MG tablet TAKE 1 TABLET BY MOUTH ONCE DAILY WITH BREAKFAST Patient taking differently: Take 500 mg by mouth daily with breakfast.  01/25/18  Yes Lauree Chandler, NP  nystatin  (MYCOSTATIN/NYSTOP) powder Apply 100,000 g topically daily as needed. Apply to groin area for yeast/fungal infection Patient taking differently: Apply 100,000 g topically daily as needed (to groin area for yeast or fungal infections).  02/17/18  Yes Lauree Chandler, NP  simvastatin (ZOCOR) 20 MG tablet TAKE 1 TABLET BY MOUTH ONCE DAILY FOR CHOLESTEROL Patient taking differently: Take 20 mg by mouth daily.  03/02/18  Yes Lauree Chandler, NP  traMADol (ULTRAM) 50 MG tablet Take one to two tablets by mouth every 6 hours as needed for pain. Do not take more than 6 tablets in 24 hours Patient taking differently: Take 50-100 mg by mouth every 6 (six) hours as needed (for pain- not to exceed 6 tablets in 24 hours).  03/15/18  Yes Lauree Chandler, NP  triamcinolone lotion (KENALOG) 0.1 % Apply 1 application topically 2 (two) times daily as needed (to irritated skin folds).    Yes [provider]  warfarin (COUMADIN) 10 MG tablet TAKE 1 TABLET BY MOUTH ONCE DAILY ALONG  WITH  A  5  MG  TABLET  FOR  A  TOTAL  OF  15  MG  DAILY Patient taking differently: Take 10 mg by mouth daily after supper.  04/14/18  Yes Hendricks Limes, MD  warfarin (COUMADIN) 5 MG tablet Take 5-7.5 mg by mouth See admin instructions. Take 7.5 mg by mouth after supper on Sun/Mon/Wed/Fri/Sat and 5 mg on Tues/Thurs   Yes [provider]  Blood Glucose Monitoring Suppl (ONETOUCH VERIO) w/Device KIT by Does not apply route. Check blood sugar twice daily as directed DX E11.65    [provider]  Leuprolide Acetate, 3 Month, (LUPRON DEPOT, 9-MONTH, IM) Give injection based on recommendations of Urologist.    [provider]  [place] LANCETS 44R MISC USE 1  TO [place] Patient taking differently: 2 (two) times daily.  03/02/18   Lauree Chandler, NP  ONETOUCH VERIO test strip USE 1 STRIP TO CHECK GLUCOSE TWICE DAILY AS DIRECTED Patient taking differently: 2 (two) times daily.   03/02/18   Lauree Chandler, NP  predniSONE (STERAPRED UNI-PAK 21 TAB) 10 MG (21) TBPK tablet Take by mouth daily. 6 tabs  for 2 days,  5 tabs for 2 days,  4 tabs for 2 days, 3 tabs for 2 days, 2 tabs for 2 days,  1 tab  for 2 days Patient not taking: Reported on 06/21/2019 05/03/18   Orvan July, NP    ALLERGIES:  No Known Allergies  SOCIAL HISTORY:  Social History   Tobacco Use  . Smoking status: Former Smoker    Years: 50.00    Types: Cigarettes    Quit date: 01/03/2009    Years since quitting: 10.4  . Smokeless tobacco: Never Used  Substance Use Topics  . Alcohol use: Yes    Alcohol/week: 3.0  standard drinks    Types: 3 Cans of beer per week    FAMILY HISTORY: Family History  Problem Relation Age of Onset  . Cancer Mother 23       colon , breast  . Colon cancer Mother   . Heart attack Daughter        2008  . Parkinson's disease Sister   . Cancer Sister        breast  . Cancer Maternal Uncle        melanoma  . Cancer Paternal Uncle     EXAM: BP (!) 150/53   Pulse (!) 56   Temp 98.3 F (36.8 C)   Resp 18   SpO2 98%  CONSTITUTIONAL: Alert and oriented x4 and responds appropriately to questions.  Elderly, obese HEAD: Normocephalic, atraumatic EYES: Conjunctivae clear, pupils appear equal, EOMI, no hyphema or hypopyon, patient blood pressure scleral icterus or scleral injection, no drainage, wears glasses at baseline ENT: normal nose; moist mucous membranes NECK: Supple, no meningismus, no nuchal rigidity, no LAD  CARD: RRR; S1 and S2 appreciated; no murmurs, no clicks, no rubs, no gallops RESP: Normal chest excursion without splinting or tachypnea; breath sounds clear and equal bilaterally; no wheezes, no rhonchi, no rales, no hypoxia or respiratory distress, speaking full sentences ABD/GI: Normal bowel sounds; non-distended; soft, non-tender, no rebound, no guarding, no peritoneal signs, no hepatosplenomegaly BACK:  The back appears normal and is non-tender to  palpation, there is no CVA tenderness EXT: Normal ROM in all joints; non-tender to palpation; no edema; normal capillary refill; no cyanosis, no calf tenderness or swelling    SKIN: Normal color for age and race; warm; no rash NEURO: Moves all extremities equally, strength 5/5 all lower extremities, sensation to light touch intact diffusely, cranial nerves II through XII intact, normal speech, no dysmetria finger-nose testing bilaterally, no visual field deficits PSYCH: The patient's mood and manner are appropriate. Grooming and personal hygiene are appropriate.  MEDICAL DECISION MAKING: Patient here with hypertension, blurry vision.  Blurry vision has now resolved.  He has no focal neurologic deficits currently.  Labs obtained in triage unremarkable.  CT of the head shows a focal low density seen in the left parietal cortex which could represent an old infarct but acute or subacute component cannot be excluded.  MRI recommended for further evaluation.  Will obtain MRI of the brain without contrast.  Discussed this with patient and wife.  His blood pressure is currently ranging in the 150s to 160s/50s to 90s.  He has no current complaints.  NIH stroke scale 0.  Will monitor closely.  ED PROGRESS: Patient is still neurologically intact.  Blood pressure is normal at 127/58.  MRI brain shows no acute abnormality.  Recommended close follow-up with primary care physician for intermittent hypertension.  Discussed return precautions.  Doubt TIA.  Doubt intracranial hemorrhage.  No vision loss or visual field deficits.   At this time, I do not feel there is any life-threatening condition present. I have reviewed and discussed all results (EKG, imaging, lab, urine as appropriate) and exam findings with patient/family. I have reviewed nursing notes and appropriate previous records.  I feel the patient is safe to be discharged home without further emergent workup and can continue workup as an outpatient as needed.  Discussed usual and customary return precautions. Patient/family verbalize understanding and are comfortable with this plan.  Outpatient follow-up has been provided as needed. All questions have been answered.     EKG Interpretation  Date/Time:  Tuesday June 21 2019 22:51:17 EDT Ventricular Rate:  57 PR Interval:    QRS Duration: 153 QT Interval:  447 QTC Calculation: 436 R Axis:   -19 Text Interpretation:  Sinus rhythm Short PR interval Right bundle branch block Inferior infarct, old No old tracing to compare Confirmed by Ward, Cyril Mourning [phone]) on 06/21/2019 11:31:10 PM        Elizbeth Squires was evaluated in Emergency Department on 06/21/2019 for the symptoms described in the history of present illness. He was evaluated in the context of the global COVID-19 pandemic, which necessitated consideration that the patient might be at risk for infection with the SARS-CoV-2 virus that causes COVID-19. Institutional protocols and algorithms that pertain to the evaluation of patients at risk for COVID-19 are in a state of rapid change based on information released by regulatory bodies including the CDC and federal and state organizations. These policies and algorithms were followed during the patient's care in the ED.    Ward, Delice Bison, DO 06/22/19 [phone]

## 2019-06-21 NOTE — ED Triage Notes (Addendum)
Pt reports watching TV tonight and started having blurred vision; per wife at 26, per pt, earlier in the day. Wife check bp several times with readings from [id number] systolic to [id number]. No other stroke symptoms, hx of stroke and dvt 20 years ago.

## 2019-06-22 ENCOUNTER — Emergency Department (HOSPITAL_COMMUNITY): Payer: PPO

## 2019-06-22 DIAGNOSIS — H538 Other visual disturbances: Secondary | ICD-10-CM | POA: Diagnosis not present

## 2019-06-22 NOTE — Discharge Instructions (Addendum)
Your labs today and MRI of your brain showed no acute abnormality including no acute stroke.  Your blurry vision was likely related to your elevated blood pressure.  Your blood pressure came down without any intervention and is currently normal in the 120s/50s.  Recommend you continue your lisinopril as prescribed and follow-up closely with your primary care physician.

## 2019-06-22 NOTE — ED Notes (Signed)
Pt transported to MRI 

## 2019-06-23 DIAGNOSIS — E1169 Type 2 diabetes mellitus with other specified complication: Secondary | ICD-10-CM | POA: Diagnosis not present

## 2019-06-23 DIAGNOSIS — Z7901 Long term (current) use of anticoagulants: Secondary | ICD-10-CM | POA: Diagnosis not present

## 2019-06-23 DIAGNOSIS — H539 Unspecified visual disturbance: Secondary | ICD-10-CM | POA: Diagnosis not present

## 2019-06-23 DIAGNOSIS — Z86718 Personal history of other venous thrombosis and embolism: Secondary | ICD-10-CM | POA: Diagnosis not present

## 2019-06-23 DIAGNOSIS — I10 Essential (primary) hypertension: Secondary | ICD-10-CM | POA: Diagnosis not present

## 2019-06-23 DIAGNOSIS — I480 Paroxysmal atrial fibrillation: Secondary | ICD-10-CM | POA: Diagnosis not present

## 2019-07-01 DIAGNOSIS — C61 Malignant neoplasm of prostate: Secondary | ICD-10-CM | POA: Diagnosis not present

## 2019-07-08 DIAGNOSIS — R351 Nocturia: Secondary | ICD-10-CM | POA: Diagnosis not present

## 2019-07-08 DIAGNOSIS — C61 Malignant neoplasm of prostate: Secondary | ICD-10-CM | POA: Diagnosis not present

## 2019-07-28 DIAGNOSIS — I480 Paroxysmal atrial fibrillation: Secondary | ICD-10-CM | POA: Diagnosis not present

## 2019-07-28 DIAGNOSIS — Z7901 Long term (current) use of anticoagulants: Secondary | ICD-10-CM | POA: Diagnosis not present

## 2019-07-28 DIAGNOSIS — Z86718 Personal history of other venous thrombosis and embolism: Secondary | ICD-10-CM | POA: Diagnosis not present

## 2019-08-11 DIAGNOSIS — R351 Nocturia: Secondary | ICD-10-CM | POA: Diagnosis not present

## 2019-08-30 DIAGNOSIS — I480 Paroxysmal atrial fibrillation: Secondary | ICD-10-CM | POA: Diagnosis not present

## 2019-08-30 DIAGNOSIS — Z86718 Personal history of other venous thrombosis and embolism: Secondary | ICD-10-CM | POA: Diagnosis not present

## 2019-08-30 DIAGNOSIS — Z7901 Long term (current) use of anticoagulants: Secondary | ICD-10-CM | POA: Diagnosis not present

## 2019-08-30 DIAGNOSIS — E1169 Type 2 diabetes mellitus with other specified complication: Secondary | ICD-10-CM | POA: Diagnosis not present

## 2019-09-21 ENCOUNTER — Encounter: Payer: Self-pay | Admitting: Cardiology

## 2019-09-21 ENCOUNTER — Other Ambulatory Visit: Payer: Self-pay

## 2019-09-21 ENCOUNTER — Ambulatory Visit (INDEPENDENT_AMBULATORY_CARE_PROVIDER_SITE_OTHER): Payer: PPO | Admitting: Cardiology

## 2019-09-21 VITALS — BP 104/70 | HR 81 | Ht 67.0 in | Wt 243.0 lb

## 2019-09-21 DIAGNOSIS — D6859 Other primary thrombophilia: Secondary | ICD-10-CM | POA: Diagnosis not present

## 2019-09-21 DIAGNOSIS — I10 Essential (primary) hypertension: Secondary | ICD-10-CM | POA: Diagnosis not present

## 2019-09-21 DIAGNOSIS — I480 Paroxysmal atrial fibrillation: Secondary | ICD-10-CM

## 2019-09-21 NOTE — Progress Notes (Signed)
Primary Physician/Referring:  Marton Redwood, MD  Patient ID: Colton Miranda, male    DOB: [birth year], 78 y.o.   MRN: [id number]  Chief Complaint  Patient presents with  . Atrial Fibrillation  . Hypertension  . Bradycardia   HPI:    Colton Miranda  is a 78 y.o. 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 He was evaluated by Dr. Ignacia Palma in July 2019, there was question about atrial fibrillation. There is no documented atrial fibrillation in chart.  He remains asymptomatic. No syncope, no decreased exercise tolerance, no unexplained dizziness. He has limited activity, Has gait difficulty and uses a cane for walking.  He had one fall in the recent past but no injury.  No bleeding diathesis on anticoagulation.  He denies chest pain or dyspnea.  However his wife states that recently he has slowed down significantly especially in view of marked degenerative joint disease and gait instability.  But does admit that he is fairly frequently and also snacks frequently.  Past Medical History:  Diagnosis Date  . BPH associated with nocturia   . Chronic constipation   . COPD (chronic obstructive pulmonary disease) ([place])   . History of adenomatous polyp of colon   . History of basal cell carcinoma (BCC) of skin    post excision from trunk  . 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 20 years ago , 1999, caused by blood clot after knee scope"  DVT to PE  . History of DVT (deep vein thrombosis)    11-06-2017 per pt and wife happened lower leg approx. 1999 after knee scope  . History of pulmonary embolus  (PE)    11-06-2017 from DVT in approx. 1999 per wife and pt  . Hyperlipidemia   . Hypertension    followed by pcp  . Long term (current) use of anticoagulants Coumadin-- followed by Pharmasist w/ [facility]   secondary to primary hypercoagulopathy w/ hx DVT and PE  . Myalgia and myositis, unspecified   . OA (osteoarthritis)    "all joints"  . Pinched nerve in neck    per pt causes intermittant numbness bilateral arms and hands  . Primary hypercoagulable state ([place])   . Prostate cancer [facility]) urologist-  dr Alyson Ingles  oncologist-- dr Tammi Klippel   dx 08-28-2017  Stage T1c, Gleason 4+4, PSA 14.1, vol 42.8cc-- planned treatment external radiation and ADT  . Right bundle branch block   . Seborrheic dermatitis, unspecified   . Type 2 diabetes mellitus ([place])   . Urinary frequency   . Wears glasses    Past Surgical History:  Procedure Laterality Date  . CHOLECYSTECTOMY OPEN  1981  . COLONOSCOPY  last one 01-28-2017  . GOLD SEED IMPLANT N/A 11/16/2017   Procedure: GOLD SEED IMPLANT;  Surgeon: Cleon Gustin, MD;  [facility];  Service: Urology;  Laterality: N/A;  . KNEE ARTHROSCOPY Right 1990s  . PILONIDAL CYST EXCISION  1968  . PROSTATE BIOPSY  09/10/2017    (done in office)   + Pos for Prostate Cancer, Dr.MCkinzie( [facility])   . SPACE OAR INSTILLATION N/A 11/16/2017   Procedure:  SPACE OAR INSTILLATION;  Surgeon: Cleon Gustin, MD;  Location: [facility];  Service: Urology;  Laterality: N/A;  . TOTAL HIP ARTHROPLASTY Right 06-18-2009   dr Wynelle Link  [facility]   Social History   Socioeconomic History  . Marital status: Married    Spouse name: Not on file  . Number of children: 1  . Years of education: Not on file  . Highest education level: Not on file  Occupational History  . Not on file  Tobacco Use  . Smoking status: Former Smoker    Years: 50.00    Types: Cigarettes    Quit date: 01/03/2009    Years since quitting:  10.7  . Smokeless tobacco: Never Used  Substance and Sexual Activity  . Alcohol use: Yes    Alcohol/week: 3.0 standard drinks    Types: 3 Cans of beer per week    Comment: 1-2 beers a week  . Drug use: No  . Sexual activity: Not Currently    Partners: Female  Other Topics Concern  . Not on file  Social History Narrative  . Not on file   Social Determinants of Health   Financial Resource Strain:   . Difficulty of Paying Living Expenses: Not on file  Food Insecurity:   . Worried About Charity fundraiser in the Last Year: Not on file  . Ran Out of Food in the Last Year: Not on file  Transportation Needs:   . Lack of Transportation (Medical): Not on file  . Lack of Transportation (Non-Medical): Not on file  Physical Activity:   . Days of Exercise per Week: Not on file  . Minutes of Exercise per Session: Not on file  Stress:   . Feeling of Stress : Not on file  Social Connections:   . Frequency of Communication with Friends and Family: Not on file  . Frequency of Social Gatherings with Friends and Family: Not on file  . Attends Religious Services: Not on file  . Active Member of Clubs or Organizations: Not on file  . Attends Archivist Meetings: Not on file  . Marital Status: Not on file  Intimate Partner Violence:   . Fear of Current or Ex-Partner: Not on file  . Emotionally Abused: Not on file  . Physically Abused: Not on file  . Sexually Abused: Not on file   ROS  Review of Systems  Constitution: Negative for chills, decreased appetite, malaise/fatigue and weight gain.  Cardiovascular: Negative for chest pain, dyspnea on exertion, leg swelling and syncope.  Endocrine: Negative for cold intolerance.  Hematologic/Lymphatic: Does not bruise/bleed easily.  Musculoskeletal: Positive for back pain and joint pain (bilateral hip and knee). Negative for joint swelling.  Gastrointestinal: Negative for abdominal pain, anorexia, change in bowel habit, hematochezia and  melena.  Neurological: Positive for loss of balance (uses a cane to support). Negative for headaches and light-headedness.  Psychiatric/Behavioral: Negative for depression and substance abuse.  All other systems reviewed and are negative.  Objective  Blood pressure 104/70, pulse 81, height 5' 7"  (1.702 m), weight 243 lb (110.2 kg), SpO2 99 %.  Vitals with BMI 09/21/2019 06/22/2019 06/22/2019  Height 5' 7"  - -  Weight 243 lbs - -  BMI 40.98 - -  Systolic 119 - 147  Diastolic 70 - 58  Pulse 81 61 -     Physical Exam  Constitutional:  He is well built and moderately obese in no acute distress.  HENT:  Head: Atraumatic.  Eyes: Conjunctivae are normal.  Neck: No JVD present. No thyromegaly present.  Cardiovascular: Normal rate, regular rhythm, normal heart sounds and intact distal pulses. Exam reveals no gallop.  No murmur heard. Pulses:      Carotid pulses are 2+ on the right side and 2+ on the left side.      Popliteal pulses are 2+ on the right side and 2+ on the left side.  No leg edema, no JVD. Toenails are thick  with onychomycosis.  Pulmonary/Chest: Effort normal and breath sounds normal.  Abdominal: Soft. Bowel sounds are normal.  Obese  Musculoskeletal:        General: Normal range of motion.     Cervical back: Neck supple.  Neurological: He is alert.  Skin: Skin is warm and dry.  Psychiatric: He has a normal mood and affect.   Laboratory examination:   Recent Labs    06/21/19 2042 06/21/19 2131  NA 135 137  K 4.2 4.4  CL 101 101  CO2 24  --   GLUCOSE 125* 118*  BUN 18 22  CREATININE 0.98 0.90  CALCIUM 9.5  --   GFRNONAA >60  --   GFRAA >60  --    CrCl cannot be calculated (Patient's most recent lab result is older than the maximum 21 days allowed.).  CMP Latest Ref Rng & Units 06/21/2019 06/21/2019 05/03/2018  Glucose 70 - 99 mg/dL 118(H) 125(H) 116(H)  BUN 8 - 23 mg/dL 22 18 36(H)  Creatinine 0.61 - 1.24 mg/dL 0.90 0.98 1.21  Sodium 135 - 145 mmol/L 137  135 136  Potassium 3.5 - 5.1 mmol/L 4.4 4.2 5.0  Chloride 98 - 111 mmol/L 101 101 104  CO2 22 - 32 mmol/L - 24 25  Calcium 8.9 - 10.3 mg/dL - 9.5 9.0  Total Protein 6.5 - 8.1 g/dL - 6.7 6.4(L)  Total Bilirubin 0.3 - 1.2 mg/dL - 0.6 0.6  Alkaline Phos 38 - 126 U/L - 75 63  AST 15 - 41 U/L - 21 19  ALT 0 - 44 U/L - 23 20   CBC Latest Ref Rng & Units 06/21/2019 06/21/2019 05/03/2018  WBC 4.0 - 10.5 K/uL - 4.7 5.0  Hemoglobin 13.0 - 17.0 g/dL 12.6(L) 12.3(L) 11.5(L)  Hematocrit 39.0 - 52.0 % 37.0(L) 36.8(L) 35.1(L)  Platelets 150 - 400 K/uL - 208 192   Lipid Panel     Component Value Date/Time   CHOL 111 06/11/2017 0900   CHOL 106 01/23/2016 1255   TRIG 70 06/11/2017 0900   HDL 47 06/11/2017 0900   HDL 40 01/23/2016 1255   CHOLHDL 2.4 06/11/2017 0900   VLDL 18 05/23/2016 0926   LDLCALC 49 06/11/2017 0900   HEMOGLOBIN A1C Lab Results  Component Value Date   HGBA1C 6.5 (H) 04/14/2018   MPG 140 04/14/2018   TSH No results for input(s): TSH in the last 8760 hours.   Medications and allergies  No Known Allergies   Current Outpatient Medications  Medication Instructions  . alfuzosin (UROXATRAL) 10 mg, Oral, Daily with breakfast  . Blood Glucose Monitoring Suppl (ONETOUCH VERIO) w/Device KIT Does not apply, Check blood sugar twice daily as directed DX E11.65  . docusate sodium (COLACE) 100 mg, Oral, Daily PRN  . lisinopril (ZESTRIL) 20 mg, Oral, Daily  . metFORMIN (GLUCOPHAGE) 500 MG tablet TAKE 1 TABLET BY MOUTH ONCE DAILY WITH BREAKFAST  . nystatin (MYCOSTATIN/NYSTOP) 100,000 g, Topical, Daily PRN, Apply to groin area for yeast/fungal infection   . ONETOUCH  [place] LANCETS 34V MISC USE 1  TO CHECK GLUCOSE TWICE DAILY  . ONETOUCH VERIO test strip USE 1 STRIP TO CHECK GLUCOSE TWICE DAILY AS DIRECTED  . simvastatin (ZOCOR) 20 MG tablet TAKE 1 TABLET BY MOUTH ONCE DAILY FOR CHOLESTEROL  . traMADol (ULTRAM) 50 MG tablet Take one to two tablets by mouth every 6 hours as needed for  pain. Do not take more than 6 tablets in 24 hours  . triamcinolone lotion (KENALOG) 0.1 % 1 application, Topical, 2 times daily PRN  . Vitamin D3 2,000 Units, Oral, Daily after supper  . warfarin (COUMADIN) 10 MG tablet TAKE 1 TABLET BY MOUTH ONCE DAILY ALONG  WITH  A  5  MG  TABLET  FOR  A  TOTAL  OF  15  MG  DAILY  . warfarin (COUMADIN) 5-7.5 mg, Oral, See admin instructions, Take 7.5 mg by mouth after supper on Sun/Mon/Wed/Fri/Sat and 5 mg on Tues/Thurs   Radiology:  No results found.  Cardiac Studies:   Stress EKG 01/30/12: Negative for ischemia. 4:25 min. 5.5 METs. Reduced aerobic tolerence. Able to mount appropriate heart rate response.  Assessment     ICD-10-CM   1. Essential hypertension  I10 EKG 12-Lead  2. PAF (paroxysmal atrial fibrillation) (HCC)  I48.0    CHA2DS2-VASc Score is 5.  Yearly risk of stroke: 6.7% (A, HTN, DM, DVT/PE).    3. Primary hypercoagulable state ([place])  D68.59     EKG 09/21/2019: Normal sinus rhythm at the rate of 75 bpm, normal axis, right bundle branch block.  PACs (2).  Baseline artifact.  EKG 09/17/2018: Marked sinus bradycardia at rate of 55 beats minute, normal axis, right bundle branch block. No evidence of ischemia.  Recommendations:  No orders of the defined types were placed in this encounter.   LENNYN BELLANCA  is a 78 y.o. 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 He was evaluated by Dr. Ignacia Palma in July 2019, there was question about atrial fibrillation. There is no documented atrial fibrillation in chart. However his wife states that recently he has slowed down significantly especially in view of marked degenerative joint  disease and gait instability.  But does admit that he is fairly frequently and also snacks frequently.  Today he is not bradycardic.  I'm seeing him on annual basis in view of his age and risk of developing third-degree heart block and also for hypertension.  Blood pressure is well controlled, his lipids are also well controlled.  Anticoagulation is being managed by Dr. Carmie Kanner.  I did not make any changes to his medications, I will see him back in a year.  I encouraged him to lose weight.  [name], MD, [facility] 09/21/2019, 6:27 PM [facility] Pager: [phone] Office: [phone]

## 2019-10-12 DIAGNOSIS — I480 Paroxysmal atrial fibrillation: Secondary | ICD-10-CM | POA: Diagnosis not present

## 2019-10-12 DIAGNOSIS — Z7901 Long term (current) use of anticoagulants: Secondary | ICD-10-CM | POA: Diagnosis not present

## 2019-10-12 DIAGNOSIS — Z86718 Personal history of other venous thrombosis and embolism: Secondary | ICD-10-CM | POA: Diagnosis not present

## 2019-10-14 ENCOUNTER — Ambulatory Visit: Payer: PPO | Attending: Internal Medicine

## 2019-10-14 DIAGNOSIS — Z23 Encounter for immunization: Secondary | ICD-10-CM | POA: Insufficient documentation

## 2019-10-14 NOTE — Progress Notes (Signed)
   Covid-19 Vaccination Clinic  Name:  Colton Miranda    MRN: [id number] DOB: 1941-08-11  10/14/2019  [name] was observed post Covid-19 immunization for 15 minutes without incidence. He was provided with Vaccine Information Sheet and instruction to access the V-Safe system.   [name] was instructed to call 911 with any severe reactions post vaccine: Marland Kitchen Difficulty breathing  . Swelling of your face and throat  . A fast heartbeat  . A bad rash all over your body  . Dizziness and weakness    Immunizations Administered    Name Date Dose VIS Date Route   Pfizer COVID-19 Vaccine 10/14/2019  3:49 PM 0.3 mL 09/02/2019 Intramuscular   Manufacturer: [place]   Lot: [id number]   [place]: [id number]

## 2019-10-17 DIAGNOSIS — M1712 Unilateral primary osteoarthritis, left knee: Secondary | ICD-10-CM | POA: Diagnosis not present

## 2019-11-04 ENCOUNTER — Ambulatory Visit: Payer: PPO | Attending: Internal Medicine

## 2019-11-04 DIAGNOSIS — Z23 Encounter for immunization: Secondary | ICD-10-CM | POA: Insufficient documentation

## 2019-11-04 NOTE — Progress Notes (Signed)
   Covid-19 Vaccination Clinic  Name:  Colton Miranda    MRN: [id number] DOB: [birth year]  11/04/2019  Colton Miranda was observed post Covid-19 immunization for 15 minutes without incidence. He was provided with Vaccine Information Sheet and instruction to access the V-Safe system.   Colton Miranda was instructed to call 911 with any severe reactions post vaccine: Marland Kitchen Difficulty breathing  . Swelling of your face and throat  . A fast heartbeat  . A bad rash all over your body  . Dizziness and weakness    Immunizations Administered    Name Date Dose VIS Date Route   Pfizer COVID-19 Vaccine 11/04/2019  3:00 PM 0.3 mL 09/02/2019 Intramuscular   Manufacturer: [place]   Lot: [id number]   [place]: [id number]

## 2019-11-17 DIAGNOSIS — E1169 Type 2 diabetes mellitus with other specified complication: Secondary | ICD-10-CM | POA: Diagnosis not present

## 2019-11-17 DIAGNOSIS — E7849 Other hyperlipidemia: Secondary | ICD-10-CM | POA: Diagnosis not present

## 2019-11-17 DIAGNOSIS — Z125 Encounter for screening for malignant neoplasm of prostate: Secondary | ICD-10-CM | POA: Diagnosis not present

## 2019-11-21 DIAGNOSIS — E1142 Type 2 diabetes mellitus with diabetic polyneuropathy: Secondary | ICD-10-CM | POA: Diagnosis not present

## 2019-11-21 DIAGNOSIS — M79672 Pain in left foot: Secondary | ICD-10-CM | POA: Diagnosis not present

## 2019-11-21 DIAGNOSIS — M25572 Pain in left ankle and joints of left foot: Secondary | ICD-10-CM | POA: Diagnosis not present

## 2019-11-21 DIAGNOSIS — B351 Tinea unguium: Secondary | ICD-10-CM | POA: Diagnosis not present

## 2019-11-23 DIAGNOSIS — Z7901 Long term (current) use of anticoagulants: Secondary | ICD-10-CM | POA: Diagnosis not present

## 2019-11-23 DIAGNOSIS — Z86718 Personal history of other venous thrombosis and embolism: Secondary | ICD-10-CM | POA: Diagnosis not present

## 2019-11-23 DIAGNOSIS — I480 Paroxysmal atrial fibrillation: Secondary | ICD-10-CM | POA: Diagnosis not present

## 2019-11-23 DIAGNOSIS — E1169 Type 2 diabetes mellitus with other specified complication: Secondary | ICD-10-CM | POA: Diagnosis not present

## 2019-11-24 DIAGNOSIS — E785 Hyperlipidemia, unspecified: Secondary | ICD-10-CM | POA: Diagnosis not present

## 2019-11-24 DIAGNOSIS — Z8546 Personal history of malignant neoplasm of prostate: Secondary | ICD-10-CM | POA: Diagnosis not present

## 2019-11-24 DIAGNOSIS — R82998 Other abnormal findings in urine: Secondary | ICD-10-CM | POA: Diagnosis not present

## 2019-11-24 DIAGNOSIS — Z0000 Encounter for general adult medical examination without abnormal findings: Secondary | ICD-10-CM | POA: Diagnosis not present

## 2019-11-24 DIAGNOSIS — D6869 Other thrombophilia: Secondary | ICD-10-CM | POA: Diagnosis not present

## 2019-11-24 DIAGNOSIS — M25562 Pain in left knee: Secondary | ICD-10-CM | POA: Diagnosis not present

## 2019-11-24 DIAGNOSIS — I480 Paroxysmal atrial fibrillation: Secondary | ICD-10-CM | POA: Diagnosis not present

## 2019-11-24 DIAGNOSIS — I69322 Dysarthria following cerebral infarction: Secondary | ICD-10-CM | POA: Diagnosis not present

## 2019-11-24 DIAGNOSIS — E1169 Type 2 diabetes mellitus with other specified complication: Secondary | ICD-10-CM | POA: Diagnosis not present

## 2019-11-24 DIAGNOSIS — G3184 Mild cognitive impairment, so stated: Secondary | ICD-10-CM | POA: Diagnosis not present

## 2019-11-24 DIAGNOSIS — Z1331 Encounter for screening for depression: Secondary | ICD-10-CM | POA: Diagnosis not present

## 2019-11-24 DIAGNOSIS — F17201 Nicotine dependence, unspecified, in remission: Secondary | ICD-10-CM | POA: Diagnosis not present

## 2019-11-24 DIAGNOSIS — I10 Essential (primary) hypertension: Secondary | ICD-10-CM | POA: Diagnosis not present

## 2019-11-24 DIAGNOSIS — I77811 Abdominal aortic ectasia: Secondary | ICD-10-CM | POA: Diagnosis not present

## 2019-11-25 DIAGNOSIS — I10 Essential (primary) hypertension: Secondary | ICD-10-CM | POA: Diagnosis not present

## 2019-11-29 DIAGNOSIS — C61 Malignant neoplasm of prostate: Secondary | ICD-10-CM | POA: Diagnosis not present

## 2019-12-05 DIAGNOSIS — M25572 Pain in left ankle and joints of left foot: Secondary | ICD-10-CM | POA: Diagnosis not present

## 2019-12-06 DIAGNOSIS — C61 Malignant neoplasm of prostate: Secondary | ICD-10-CM | POA: Diagnosis not present

## 2019-12-06 DIAGNOSIS — R351 Nocturia: Secondary | ICD-10-CM | POA: Diagnosis not present

## 2019-12-07 DIAGNOSIS — I739 Peripheral vascular disease, unspecified: Secondary | ICD-10-CM | POA: Diagnosis not present

## 2019-12-07 DIAGNOSIS — B351 Tinea unguium: Secondary | ICD-10-CM | POA: Diagnosis not present

## 2019-12-07 DIAGNOSIS — M205X2 Other deformities of toe(s) (acquired), left foot: Secondary | ICD-10-CM | POA: Diagnosis not present

## 2019-12-07 DIAGNOSIS — M205X1 Other deformities of toe(s) (acquired), right foot: Secondary | ICD-10-CM | POA: Diagnosis not present

## 2019-12-07 DIAGNOSIS — E1151 Type 2 diabetes mellitus with diabetic peripheral angiopathy without gangrene: Secondary | ICD-10-CM | POA: Diagnosis not present

## 2019-12-07 DIAGNOSIS — M792 Neuralgia and neuritis, unspecified: Secondary | ICD-10-CM | POA: Diagnosis not present

## 2019-12-07 DIAGNOSIS — L84 Corns and callosities: Secondary | ICD-10-CM | POA: Diagnosis not present

## 2020-01-02 DIAGNOSIS — M1712 Unilateral primary osteoarthritis, left knee: Secondary | ICD-10-CM | POA: Diagnosis not present

## 2020-01-02 DIAGNOSIS — M25462 Effusion, left knee: Secondary | ICD-10-CM | POA: Diagnosis not present

## 2020-01-04 DIAGNOSIS — Z86718 Personal history of other venous thrombosis and embolism: Secondary | ICD-10-CM | POA: Diagnosis not present

## 2020-01-04 DIAGNOSIS — I480 Paroxysmal atrial fibrillation: Secondary | ICD-10-CM | POA: Diagnosis not present

## 2020-01-04 DIAGNOSIS — D6869 Other thrombophilia: Secondary | ICD-10-CM | POA: Diagnosis not present

## 2020-01-09 ENCOUNTER — Encounter: Payer: Self-pay | Admitting: Cardiology

## 2020-01-09 DIAGNOSIS — M1712 Unilateral primary osteoarthritis, left knee: Secondary | ICD-10-CM | POA: Diagnosis not present

## 2020-01-11 DIAGNOSIS — D6869 Other thrombophilia: Secondary | ICD-10-CM | POA: Diagnosis not present

## 2020-01-11 DIAGNOSIS — M25562 Pain in left knee: Secondary | ICD-10-CM | POA: Diagnosis not present

## 2020-01-11 DIAGNOSIS — E1169 Type 2 diabetes mellitus with other specified complication: Secondary | ICD-10-CM | POA: Diagnosis not present

## 2020-01-11 DIAGNOSIS — I10 Essential (primary) hypertension: Secondary | ICD-10-CM | POA: Diagnosis not present

## 2020-01-11 DIAGNOSIS — D649 Anemia, unspecified: Secondary | ICD-10-CM | POA: Diagnosis not present

## 2020-01-11 DIAGNOSIS — E669 Obesity, unspecified: Secondary | ICD-10-CM | POA: Diagnosis not present

## 2020-01-12 DIAGNOSIS — D649 Anemia, unspecified: Secondary | ICD-10-CM | POA: Diagnosis not present

## 2020-01-17 ENCOUNTER — Encounter (HOSPITAL_COMMUNITY): Admission: RE | Admit: 2020-01-17 | Payer: PPO | Source: Ambulatory Visit

## 2020-01-18 ENCOUNTER — Encounter (HOSPITAL_COMMUNITY): Payer: PPO

## 2020-01-18 DIAGNOSIS — Z7901 Long term (current) use of anticoagulants: Secondary | ICD-10-CM | POA: Diagnosis not present

## 2020-01-18 DIAGNOSIS — I480 Paroxysmal atrial fibrillation: Secondary | ICD-10-CM | POA: Diagnosis not present

## 2020-01-18 DIAGNOSIS — Z86718 Personal history of other venous thrombosis and embolism: Secondary | ICD-10-CM | POA: Diagnosis not present

## 2020-01-19 ENCOUNTER — Encounter: Payer: Self-pay | Admitting: Allergy

## 2020-01-19 ENCOUNTER — Other Ambulatory Visit: Payer: Self-pay

## 2020-01-19 ENCOUNTER — Ambulatory Visit: Payer: PPO | Admitting: Allergy

## 2020-01-19 VITALS — BP 108/68 | HR 65 | Temp 98.1°F | Resp 17 | Ht 64.5 in | Wt 223.2 lb

## 2020-01-19 DIAGNOSIS — L259 Unspecified contact dermatitis, unspecified cause: Secondary | ICD-10-CM | POA: Diagnosis not present

## 2020-01-19 NOTE — Assessment & Plan Note (Addendum)
Contact irritation from wearing watches. Patient needs a left knee replacement but due to this history requires metal patch testing done beforehand. Had right hip replacement 10 years ago with no issues.  Return for metal patch testing in our [place] office next week.   Patches are best placed on Monday with return to office on Wednesday and Friday of same week for readings.

## 2020-01-19 NOTE — Patient Instructions (Signed)
Return for patch testing on Monday, Wednesday and Friday at our [place] office.  Patches are best placed on Monday with return to office on Wednesday and Friday of same week for readings.  Patches once placed should not get wet.  You do not have to stop any medications for patch testing but should not be on oral prednisone. You can schedule a patch testing visit when convenient for your schedule.

## 2020-01-19 NOTE — Progress Notes (Signed)
New Patient Note  RE: Colton [facility] MRN: [id number] DOB: 1940-12-31 Date of Office Visit: 01/19/2020  Referring provider: Drue Novel, PA Primary care provider: Marton Redwood, MD  Chief Complaint: No chief complaint on file.  History of Present Illness: I had the pleasure of seeing Colton [facility] for initial evaluation at the [facility] [place] [facility] [place] on 01/19/2020. He is a 79 y.o. male, who is referred here by Colton Redwood, MD for the evaluation of nickel allergy.  He is accompanied today by his wife who provided/contributed to the history.   Patient is scheduled to have left knee replacement. 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 He had issues with watches since he was a child with contact dermatitis.  10 years ago he had a right hip replacement with no issues but not sure what the material was made of. No issues with infections or healing post surgery.   No issues with his eyeglasses. Patient is able to wear gold with no issues.  No issues with buttons or belt buckles.   Assessment and Plan: Colton Miranda is a 79 y.o. male with: Contact dermatitis Contact irritation from wearing watches. Patient needs a left knee replacement but due to this history requires metal patch testing done beforehand. Had right hip replacement 10 years ago with no issues.  Return for metal patch testing in our [place] office next week.   Patches are best placed on Monday with return to office on Wednesday and Friday of same week for readings.  Return in about 4 days (around 01/23/2020) for Patch testing.  Other allergy screening: Asthma: no Rhino conjunctivitis: no Food allergy: no Medication allergy: no Hymenoptera allergy: no Urticaria: no Eczema:no History of recurrent infections suggestive of immunodeficency: no  Diagnostics: None.  Past Medical History: Patient Active Problem List   Diagnosis Date Noted  . Contact dermatitis 01/19/2020  . PAF (paroxysmal atrial  fibrillation) ([place]) 04/14/2018  . Malignant neoplasm of prostate ([place]) 10/07/2017  . Colon polyps 11/26/2016  . Influenza vaccination declined 05/28/2016  . History of fall 07/25/2015  . Palpitations 11/08/2014  . Pain of left arm 11/08/2014  . Pain in joint, lower leg 02/28/2014  . Essential hypertension 02/28/2014  . Disturbance of skin sensation 02/23/2013  . Insomnia, unspecified 02/23/2013  . Obesity   . DM (diabetes mellitus), secondary, uncontrolled, with peripheral vascular complications ([place])   . Hyperlipemia   . Urinary frequency   . Primary hypercoagulable state ([place]) 01/03/2013  . Other pulmonary embolism and infarction 01/03/2013  . Long term current use of anticoagulant therapy 01/03/2013   Past Medical History:  Diagnosis Date  . BPH associated with nocturia   . Chronic constipation   . COPD (chronic obstructive pulmonary disease) ([place])   . Eczema   . History of adenomatous polyp of colon   . History of basal cell carcinoma (BCC) of skin    post excision from trunk  . 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 20 years ago , 1999, caused by blood clot after knee scope"  DVT to PE  . History of DVT (deep vein thrombosis)    11-06-2017 per pt and wife happened lower leg approx. 1999 after knee scope  . History of pulmonary embolus (PE)    11-06-2017 from DVT in approx. 1999 per wife and pt  . Hyperlipidemia   . Hypertension    followed by pcp  . Long term (  current) use of anticoagulants Coumadin-- followed by Pharmasist w/ [facility]   secondary to primary hypercoagulopathy w/ hx DVT and PE  . Myalgia and myositis, unspecified   . OA (osteoarthritis)    "all joints"  . Pinched nerve in neck    per pt causes intermittant numbness bilateral arms and hands  . Primary hypercoagulable state ([place])   . Prostate cancer [facility]) urologist-  Colton Miranda  Colton Miranda   dx  08-28-2017  Stage T1c, Gleason 4+4, PSA 14.1, vol 42.8cc-- planned treatment external radiation and ADT  . Right bundle branch block   . Seborrheic dermatitis, unspecified   . Type 2 diabetes mellitus ([place])   . Urinary frequency   . Wears glasses    Past Surgical History: Past Surgical History:  Procedure Laterality Date  . CHOLECYSTECTOMY OPEN  1981  . COLONOSCOPY  last one 01-28-2017  . GOLD SEED IMPLANT N/A 11/16/2017   Procedure: GOLD SEED IMPLANT;  Surgeon: Colton Gustin, MD;  [facility];  Service: Urology;  Laterality: N/A;  . KNEE ARTHROSCOPY Right 1990s  . PILONIDAL CYST EXCISION  1968  . PROSTATE BIOPSY  09/10/2017    (done in office)   + Pos for Prostate Cancer, Colton Miranda( [facility])   . SPACE OAR INSTILLATION N/A 11/16/2017   Procedure: SPACE OAR INSTILLATION;  Surgeon: Colton Gustin, MD;  Location: [facility];  Service: Urology;  Laterality: N/A;  . TOTAL HIP ARTHROPLASTY Right 06-18-2009   Colton Miranda  [facility]   Medication List:  Current Outpatient Medications  Medication Sig Dispense Refill  . alfuzosin (UROXATRAL) 10 MG 24 hr tablet Take 1 tablet (10 mg total) by mouth daily with breakfast. 30 tablet 5  . Blood Glucose Monitoring Suppl (ONETOUCH VERIO) w/Device KIT by Does not apply route. Check blood sugar twice daily as directed DX E11.65    . Cholecalciferol (VITAMIN D3) 50 MCG (2000 UT) TABS Take 2,000 Units by mouth daily.     . diclofenac Sodium (VOLTAREN) 1 % GEL Apply 1 application topically at bedtime.    . diphenhydrAMINE (SOMINEX) 25 MG tablet Take 25 mg by mouth at bedtime as needed for sleep.    Marland Kitchen enoxaparin (LOVENOX) 100 MG/ML injection Inject 100 mg into the skin every 12 (twelve) hours.    Marland Kitchen lisinopril (PRINIVIL,ZESTRIL) 20 MG tablet Take 1 tablet (20 mg total) by mouth daily. 90 tablet 1  . metFORMIN (GLUCOPHAGE) 500 MG tablet TAKE 1 TABLET BY MOUTH ONCE DAILY WITH BREAKFAST (Patient taking  differently: Take 500 mg by mouth daily with breakfast. ) 90 tablet 1  . nystatin cream (MYCOSTATIN) Apply 1 application topically daily as needed (yeast).    Glory Rosebush DELICA LANCETS 05L MISC USE 1  TO CHECK GLUCOSE TWICE DAILY (Patient taking differently: 2 (two) times daily. ) 100 each 12  . ONETOUCH VERIO test strip USE 1 STRIP TO CHECK GLUCOSE TWICE DAILY AS DIRECTED (Patient taking differently: 2 (two) times daily. ) 100 each 11  . OVER THE COUNTER MEDICATION Apply 1 application topically every other day. Foot miracle cream - apply every other night    . senna-docusate (SENOKOT-S) 8.6-50 MG tablet Take 1 tablet by mouth at bedtime.    . simvastatin (ZOCOR) 20 MG tablet TAKE 1 TABLET BY MOUTH ONCE DAILY FOR CHOLESTEROL (Patient taking differently: Take 20 mg by mouth daily. ) 90 tablet 1  . tolnaftate (TINACTIN) 1 % cream Apply 1 application topically every other  day. At night    . traMADol (ULTRAM) 50 MG tablet Take one to two tablets by mouth every 6 hours as needed for pain. Do not take more than 6 tablets in 24 hours (Patient taking differently: Take 50-100 mg by mouth every 6 (six) hours as needed for moderate pain (for pain- not to exceed 6 tablets in 24 hours). ) 120 tablet 0  . warfarin (COUMADIN) 10 MG tablet TAKE 1 TABLET BY MOUTH ONCE DAILY ALONG  WITH  A  5  MG  TABLET  FOR  A  TOTAL  OF  15  MG  DAILY (Patient taking differently: Take 10 mg by mouth See admin instructions. Take 10 mg with the 7.5 mg to equal 17.5 mg after supper on Sun/Mon/Wed/Fri/Sat and 10 mg with the 5 mg to equal 15 mg after supper on Tues/Thurs) 90 tablet 1  . warfarin (COUMADIN) 5 MG tablet Take 5-7.5 mg by mouth See admin instructions. Take 7.5 mg with the 10 mg to equal 17.5 mg after supper on Sun/Mon/Wed/Fri/Sat and 5 mg with the 10 mg to equal 15 mg after supper on Tues/Thurs     No current facility-administered medications for this visit.   Allergies: Allergies  Allergen Reactions  . Nickel    Social  History: Social History   Socioeconomic History  . Marital status: Married    Spouse name: Not on file  . Number of children: 1  . Years of education: Not on file  . Highest education level: Not on file  Occupational History  . Not on file  Tobacco Use  . Smoking status: Former Smoker    Years: 50.00    Types: Cigarettes    Quit date: 01/03/2009    Years since quitting: 11.0  . Smokeless tobacco: Never Used  Substance and Sexual Activity  . Alcohol use: Yes    Alcohol/week: 3.0 standard drinks    Types: 3 Cans of beer per week    Comment: 1-2 beers a week  . Drug use: No  . Sexual activity: Not Currently    Partners: Female  Other Topics Concern  . Not on file  Social History Narrative  . Not on file   Social Determinants of Health   Financial Resource Strain:   . Difficulty of Paying Living Expenses:   Food Insecurity:   . Worried About Charity fundraiser in the Last Year:   . Arboriculturist in the Last Year:   Transportation Needs:   . Film/video editor (Medical):   Marland Kitchen Lack of Transportation (Non-Medical):   Physical Activity:   . Days of Exercise per Week:   . Minutes of Exercise per Session:   Stress:   . Feeling of Stress :   Social Connections:   . Frequency of Communication with Friends and Family:   . Frequency of Social Gatherings with Friends and Family:   . Attends Religious Services:   . Active Member of Clubs or Organizations:   . Attends Archivist Meetings:   Marland Kitchen Marital Status:    Lives in an apartment. Smoking: quit 10 years ago Occupation: retired  Programme researcher, broadcasting/film/video History: Environmental education officer in the house: no Charity fundraiser in the family room: yes Carpet in the bedroom: yes Heating: gas and electric Cooling: central Pet: yes 1 dog  Family History: Family History  Problem Relation Age of Onset  . Cancer Mother 77       colon , breast  . Colon cancer Mother   .  Heart attack Daughter        2008  . Parkinson's disease Sister    . Cancer Sister        breast  . Cancer Maternal Uncle        melanoma  . Cancer Paternal Uncle    Problem                               Relation Asthma                                   No  Eczema                                No  Food allergy                          No  Allergic rhino conjunctivitis     No   Review of Systems  Constitutional: Negative for appetite change, chills, fever and unexpected weight change.  HENT: Negative for congestion and rhinorrhea.   Eyes: Negative for itching.  Respiratory: Negative for cough, chest tightness, shortness of breath and wheezing.   Cardiovascular: Negative for chest pain.  Gastrointestinal: Negative for abdominal pain.  Genitourinary: Negative for difficulty urinating.  Skin: Positive for rash.  Allergic/Immunologic: Negative for environmental allergies and food allergies.  Neurological: Negative for headaches.   Objective: BP 108/68 (BP Location: Right Arm, Patient Position: Sitting, Cuff Size: Normal)   Pulse 65   Temp 98.1 F (36.7 C) (Temporal)   Resp 17   Ht 5' 4.5" (1.638 m)   Wt 223 lb 4 oz (101.3 kg)   SpO2 98%   BMI 37.73 kg/m  Body mass index is 37.73 kg/m. Physical Exam  Constitutional: He is oriented to person, place, and time. He appears well-developed and well-nourished.  HENT:  Head: Normocephalic and atraumatic.  Nose: Nose normal.  Mouth/Throat: Oropharynx is clear and moist.  B/l cerumen  Eyes: Conjunctivae and EOM are normal.  Cardiovascular: Normal rate, regular rhythm and normal heart sounds. Exam reveals no gallop and no friction rub.  No murmur heard. Pulmonary/Chest: Effort normal and breath sounds normal. He has no wheezes. He has no rales.  Abdominal: Soft.  Musculoskeletal:     Cervical back: Neck supple.  Neurological: He is alert and oriented to person, place, and time.  Skin: Skin is warm. Rash noted.  Dry, hyperpigmented patch on left carpal area.   Psychiatric: He has a normal mood  and affect. His behavior is normal.  Nursing note and vitals reviewed.  The plan was reviewed with the patient/family, and all questions/concerned were addressed.  It was my pleasure to see May today and participate in his care. Please feel free to contact me with any questions or concerns.  Sincerely,  Rexene Alberts, DO Allergy & Immunology  [facility] office: [phone] [facility] office: [place] office: [phone]

## 2020-01-20 ENCOUNTER — Ambulatory Visit (HOSPITAL_COMMUNITY): Admission: RE | Admit: 2020-01-20 | Payer: PPO | Source: Home / Self Care | Admitting: Specialist

## 2020-01-20 ENCOUNTER — Encounter (HOSPITAL_COMMUNITY): Admission: RE | Payer: Self-pay | Source: Home / Self Care

## 2020-01-20 SURGERY — ARTHROPLASTY, KNEE, TOTAL
Anesthesia: Spinal | Site: Knee | Laterality: Left

## 2020-01-23 ENCOUNTER — Encounter: Payer: Self-pay | Admitting: Allergy

## 2020-01-23 ENCOUNTER — Ambulatory Visit (INDEPENDENT_AMBULATORY_CARE_PROVIDER_SITE_OTHER): Payer: PPO | Admitting: Allergy

## 2020-01-23 ENCOUNTER — Other Ambulatory Visit: Payer: Self-pay

## 2020-01-23 VITALS — BP 110/60 | HR 71 | Temp 97.6°F | Resp 18 | Ht 64.0 in

## 2020-01-23 DIAGNOSIS — L259 Unspecified contact dermatitis, unspecified cause: Secondary | ICD-10-CM | POA: Diagnosis not present

## 2020-01-23 NOTE — Progress Notes (Signed)
   Follow Up Note  RE: Colton [facility] MRN: [id number] DOB: July 21, 1941 Date of Office Visit: 01/23/2020  Referring provider: Marton Redwood, MD Primary care provider: Marton Redwood, MD  History of Present Illness: I had the pleasure of seeing Colton [facility] for a follow up visit at the [facility] [place] [facility] [place] on 01/23/2020. He is a 79 y.o. male, who is being followed for possible contact dermatitis. Today he is here for patch test placement, given suspected history of contact dermatitis.   Diagnostics: Metal Test patches placed.   Assessment and Plan: Colton Miranda is a 79 y.o. male with: Contact dermatitis Past history - Contact irritation from wearing watches. Patient needs a left knee replacement but due to this history requires metal patch testing done beforehand. Had right hip replacement 10 years ago with no issues.  Metal patch testing placed today.   The patient was instructed regarding proper care of the patches for the next 48 hours. Do not get patches wet - avoid showering until the next visit. Do not engage in vigorous physical activity.  Patient will follow up in 48 hours and 96 hours for patch readings.  It was my pleasure to see Colton Miranda today and participate in his care. Please feel free to contact me with any questions or concerns.  Sincerely,  Rexene Alberts, DO Allergy & Immunology  [facility] office: [phone] [facility] office: [place] office: [phone]

## 2020-01-23 NOTE — Patient Instructions (Signed)
.   Patches placed today. . Please avoid strenuous physical activities and do not get the patches on the back wet. No showering until final patch reading done. Faythe Ghee to take antihistamines for itching but avoid placing any creams on the back where the patches are. . We will remove the patches on Wednesday and will do our initial read. . Then you will come back on Friday for a final read.

## 2020-01-23 NOTE — Assessment & Plan Note (Signed)
Past history - Contact irritation from wearing watches. Patient needs a left knee replacement but due to this history requires metal patch testing done beforehand. Had right hip replacement 10 years ago with no issues.  Metal patch testing placed today.

## 2020-01-25 ENCOUNTER — Ambulatory Visit: Payer: PPO | Admitting: Allergy

## 2020-01-25 ENCOUNTER — Encounter: Payer: Self-pay | Admitting: Allergy

## 2020-01-25 ENCOUNTER — Other Ambulatory Visit: Payer: Self-pay

## 2020-01-25 VITALS — Temp 98.5°F

## 2020-01-25 DIAGNOSIS — Z7901 Long term (current) use of anticoagulants: Secondary | ICD-10-CM | POA: Diagnosis not present

## 2020-01-25 DIAGNOSIS — L259 Unspecified contact dermatitis, unspecified cause: Secondary | ICD-10-CM

## 2020-01-25 DIAGNOSIS — E1169 Type 2 diabetes mellitus with other specified complication: Secondary | ICD-10-CM | POA: Diagnosis not present

## 2020-01-25 DIAGNOSIS — I480 Paroxysmal atrial fibrillation: Secondary | ICD-10-CM | POA: Diagnosis not present

## 2020-01-25 DIAGNOSIS — D6869 Other thrombophilia: Secondary | ICD-10-CM | POA: Diagnosis not present

## 2020-01-25 NOTE — Progress Notes (Signed)
   Follow Up Note  RE: Colton [facility] MRN: [id number] DOB: May 19, 1941 Date of Office Visit: 01/25/2020  Referring provider: Marton Redwood, MD Primary care provider: Marton Redwood, MD  History of Present Illness: I had the pleasure of seeing Colton [facility] for a follow up visit at the [facility] [place] [facility] [place] on 01/25/2020. He is a 79 y.o. male, who is being followed for possible contact dermatitis. Today he is here for initial patch test interpretation, given suspected history of contact dermatitis.   Diagnostics:  METAL TEST 48 hour reading:  Metals Patch - 01/25/20 1000    Time Antigen Placed  0910    Manufacturer  ALK [place]    Location  Back    Number of Test  11    Reading Interval  Day 1    Select  Select    Aluminum Hydroxide 10%  0    Chromium chloride 1%  0    Cobalt chloride hexahydrate 1%  0    Molybdenum chloride 0.5%  0    Nickel sulfate hexahydrate 5%  0    Potassium dichromate 0.25%  0    Copper sulfate pentahydrate 2%  0    Tantal 1%  0    Titanium 0.1%  0    Manganese chloride 0.5%  0    Vanadium Pentoxide 10%  0        Assessment and Plan: Colton Miranda is a 79 y.o. male with: Contact dermatitis Past history - Contact irritation from wearing watches. Patient needs a left knee replacement but due to this history requires metal patch testing done beforehand. Had right hip replacement 10 years ago with no issues.  Metal patch 48 hour reading negative.   Return in about 2 days (around 01/27/2020) for Patch reading.  It was my pleasure to see Colton Miranda today and participate in his care. Please feel free to contact me with any questions or concerns.  Sincerely,  Rexene Alberts, DO Allergy & Immunology  [facility] office: [phone] [facility] office: [place] office: [phone]

## 2020-01-25 NOTE — Assessment & Plan Note (Signed)
Past history - Contact irritation from wearing watches. Patient needs a left knee replacement but due to this history requires metal patch testing done beforehand. Had right hip replacement 10 years ago with no issues.  Metal patch 48 hour reading negative.

## 2020-01-27 ENCOUNTER — Ambulatory Visit (INDEPENDENT_AMBULATORY_CARE_PROVIDER_SITE_OTHER): Payer: PPO | Admitting: Family Medicine

## 2020-01-27 ENCOUNTER — Other Ambulatory Visit: Payer: Self-pay

## 2020-01-27 ENCOUNTER — Encounter: Payer: Self-pay | Admitting: Family Medicine

## 2020-01-27 DIAGNOSIS — L259 Unspecified contact dermatitis, unspecified cause: Secondary | ICD-10-CM

## 2020-01-27 NOTE — Progress Notes (Addendum)
    Follow-up Note  RE: Colton Miranda MRN: [id number] DOB: [birth year] Date of Office Visit: 01/27/2020  Primary care provider: Marton Redwood, MD Referring provider: Marton Redwood, MD   [name] returns to the office today for the final patch test interpretation, given suspected history of contact dermatitis to metals.    Diagnostics:   Metals testing 96-hour hour reading: Negative to chromium chloride 1%, potassium dichromate 0.25%, cobalt chloride hexahydrate 1%, copper sulfate pentahydrate 2%, molybdenum chloride 0.5%, titanium 0.1%, tantal, manganese chloride 0.5%, nickel sulfate hexahydrate 5%, aluminum hydroxide 10%, and vanadium pentoxide 10%.  Plan:   Metals testing The testing was negative to the metals we tested at the final reading today. We will send these results to your orthopedic provider this afternoon If you see any areas that are concerning to you please call the clinic.   Call the clinic if this treatment plan is not working well for you  Follow up as needed

## 2020-01-27 NOTE — Patient Instructions (Signed)
Metals testing The testing was negative to the metals we tested at the final reading today. We will send these results to your orthopedic provider this afternoon If you see any areas that are concerning to you please call the clinic.   Call the clinic if this treatment plan is not working well for you  Follow up as needed

## 2020-01-31 ENCOUNTER — Encounter (HOSPITAL_COMMUNITY): Payer: Self-pay

## 2020-01-31 NOTE — Patient Instructions (Addendum)
DUE TO COVID-19 ONLY ONE VISITOR ARE ALLOWED TO COME WITH YOU AND STAY IN THE WAITING ROOM ONLY DURING PRE OP AND PROCEDURE. THEN TWO VISITORS MAY VISIT WITH YOU IN YOUR PRIVATE ROOM DURING VISITING HOURS ONLY!!   COVID SWAB TESTING MUST BE COMPLETED ON:   Tuesday, Feb 07, 2020 at 3:00PM [address] [place] [facility] (Must self quarantine after testing. Follow instructions on handout.)             Your procedure is scheduled on: Friday, Feb 10, 2020   Report to [facility] Main  Entrance    Report to admitting at 7:15 AM   Call this number if you have problems the morning of surgery [phone]   Do not eat food or drink liquids :After Midnight.   Oral Hygiene is also important to reduce your risk of infection.                                    Remember - BRUSH YOUR TEETH THE MORNING OF SURGERY WITH YOUR REGULAR TOOTHPASTE   Do NOT smoke after Midnight   Take these medicines the morning of surgery with A SIP OF WATER: Alfuzosin, Simvastatin  DO NOT TAKE ANY ORAL DIABETIC MEDICATIONS DAY OF YOUR SURGERY                               You may not have any metal on your body including jewelry, and body piercings             Do not wear lotions, powders, perfumes/cologne, or deodorant                          Men may shave face and neck.   Do not bring valuables to the hospital. [place].   Contacts, dentures or bridgework may not be worn into surgery.   Bring small overnight bag day of surgery.    Special Instructions: Bring a copy of your healthcare power of attorney and living will documents         the day of surgery if you haven't scanned them in before.              Please read over the following fact sheets you were given: IF YOU HAVE QUESTIONS ABOUT YOUR PRE OP INSTRUCTIONS PLEASE CALL [phone]   [facility] - Preparing for Surgery Before  surgery, you can play an important role.  Because skin is not sterile, your skin needs to be as free of germs as possible.  You can reduce the number of germs on your skin by washing with CHG (chlorahexidine gluconate) soap before surgery.  CHG is an antiseptic cleaner which kills germs and bonds with the skin to continue killing germs even after washing. Please DO NOT use if you have an allergy to CHG or antibacterial soaps.  If your skin becomes reddened/irritated stop using the CHG and inform your nurse when you arrive at Short Stay. Do not shave (including legs and underarms) for at least 48 hours prior to the first CHG shower.  You may shave your face/neck.  Please follow these instructions carefully:  1.  Shower with CHG Soap the night before surgery and the  morning of surgery.  2.  If you choose to wash your hair, wash your hair first as usual with your normal  shampoo.  3.  After you shampoo, rinse your hair and body thoroughly to remove the shampoo.                             4.  Use CHG as you would any other liquid soap.  You can apply chg directly to the skin and wash.  Gently with a scrungie or clean washcloth.  5.  Apply the CHG Soap to your body ONLY FROM THE NECK DOWN.   Do   not use on face/ open                           Wound or open sores. Avoid contact with eyes, ears mouth and   genitals (private parts).                       Wash face,  Genitals (private parts) with your normal soap.             6.  Wash thoroughly, paying special attention to the area where your    surgery  will be performed.  7.  Thoroughly rinse your body with warm water from the neck down.  8.  DO NOT shower/wash with your normal soap after using and rinsing off the CHG Soap.                9.  Pat yourself dry with a clean towel.            10.  Wear clean pajamas.            11.  Place clean sheets on your bed the night of your first shower and do not  sleep with pets. Day of Surgery : Do not apply  any lotions/deodorants the morning of surgery.  Please wear clean clothes to the hospital/surgery center.  FAILURE TO FOLLOW THESE INSTRUCTIONS MAY RESULT IN THE CANCELLATION OF YOUR SURGERY  PATIENT SIGNATURE_________________________________  NURSE SIGNATURE__________________________________  ________________________________________________________________________

## 2020-02-01 ENCOUNTER — Encounter (HOSPITAL_COMMUNITY)
Admission: RE | Admit: 2020-02-01 | Discharge: 2020-02-01 | Disposition: A | Discharge status: Home / Self Care | Payer: PPO | Source: Ambulatory Visit | Attending: Specialist | Admitting: Specialist

## 2020-02-01 ENCOUNTER — Encounter (HOSPITAL_COMMUNITY): Payer: Self-pay

## 2020-02-01 ENCOUNTER — Other Ambulatory Visit: Payer: Self-pay

## 2020-02-01 DIAGNOSIS — Z01812 Encounter for preprocedural laboratory examination: Secondary | ICD-10-CM | POA: Diagnosis not present

## 2020-02-01 HISTORY — DX: Other primary thrombophilia: D68.59

## 2020-02-01 HISTORY — DX: Polyneuropathy, unspecified: G62.9

## 2020-02-01 HISTORY — DX: Cardiac murmur, unspecified: R01.1

## 2020-02-01 HISTORY — DX: Personal history of other mental and behavioral disorders: Z86.59

## 2020-02-01 HISTORY — DX: Unspecified hearing loss, unspecified ear: H91.90

## 2020-02-01 LAB — COMPREHENSIVE METABOLIC PANEL
ALT: 23 U/L (ref 0–44)
AST: 20 U/L (ref 15–41)
Albumin: 3.8 g/dL (ref 3.5–5.0)
Alkaline Phosphatase: 85 U/L (ref 38–126)
Anion gap: 8 (ref 5–15)
BUN: 20 mg/dL (ref 8–23)
CO2: 26 mmol/L (ref 22–32)
Calcium: 9.2 mg/dL (ref 8.9–10.3)
Chloride: 103 mmol/L (ref 98–111)
Creatinine, Ser: 0.95 mg/dL (ref 0.61–1.24)
GFR calc Af Amer: >60 mL/min (ref >60)
GFR calc non Af Amer: >60 mL/min (ref >60)
Glucose, Bld: 130 mg/dL — ABNORMAL HIGH (ref 70–99)
Potassium: 5.4 mmol/L — ABNORMAL HIGH (ref 3.5–5.1)
Sodium: 137 mmol/L (ref 135–145)
Total Bilirubin: 0.4 mg/dL (ref 0.3–1.2)
Total Protein: 6.8 g/dL (ref 6.5–8.1)

## 2020-02-01 LAB — CBC
HCT: 36.4 % — ABNORMAL LOW (ref 39.0–52.0)
Hemoglobin: 11.6 g/dL — ABNORMAL LOW (ref 13.0–17.0)
MCH: 28 pg (ref 26.0–34.0)
MCHC: 31.9 g/dL (ref 30.0–36.0)
MCV: 87.7 fL (ref 80.0–100.0)
Platelets: 245 10*3/uL (ref 150–400)
RBC: 4.15 MIL/uL — ABNORMAL LOW (ref 4.22–5.81)
RDW: 13.8 % (ref 11.5–15.5)
WBC: 5.8 10*3/uL (ref 4.0–10.5)
nRBC: 0 % (ref 0.0–0.2)

## 2020-02-01 LAB — PROTIME-INR
INR: 2.4 — ABNORMAL HIGH (ref 0.8–1.2)
Prothrombin Time: 25 seconds — ABNORMAL HIGH (ref 11.4–15.2)

## 2020-02-01 LAB — GLUCOSE, CAPILLARY: Glucose-Capillary: 124 mg/dL — ABNORMAL HIGH (ref 70–99)

## 2020-02-01 LAB — HEMOGLOBIN A1C
Hgb A1c MFr Bld: 6.7 % — ABNORMAL HIGH (ref 4.8–5.6)
Mean Plasma Glucose: 145.59 mg/dL

## 2020-02-01 LAB — SURGICAL PCR SCREEN
MRSA, PCR: NEGATIVE
Staphylococcus aureus: NEGATIVE

## 2020-02-01 LAB — APTT: aPTT: 41 seconds — ABNORMAL HIGH (ref 24–36)

## 2020-02-01 NOTE — Progress Notes (Signed)
Has completed COVID 5 vaccine seies  PCP - Dr. Brigitte Pulse Cardiologist - Dr. Christen Butter last office visit 09/21/19 in epic  Chest x-ray - greater than 1 year EKG - 09/21/19 in epic Stress Test - greater than 2 years ECHO - N/A Cardiac Cath - N/A  Sleep Study - Yes CPAP - No OSA  Fasting Blood Sugar - 108-120's Checks Blood Sugar __1___ times every other day  Blood Thinner Instructions: Warfarin last dose 02/04/20 Lovenox twice daily from Sunday 5/16-Thursday 02/09/20 Aspirin Instructions: N/A Last Dose:N/A  Anesthesia review: COPD, CVA DUE TO DVT. PE AFTER KNEE ARTHROSCOPY, PAF, DM, HTN   Patient denies shortness of breath, fever, cough and chest pain at PAT appointment   Patient verbalized understanding of instructions that were given to them at the PAT appointment. Patient was also instructed that they will need to review over the PAT instructions again at home before surgery.

## 2020-02-07 ENCOUNTER — Other Ambulatory Visit (HOSPITAL_COMMUNITY)
Admission: RE | Admit: 2020-02-07 | Discharge: 2020-02-07 | Disposition: A | Discharge status: Home / Self Care | Payer: PPO | Source: Ambulatory Visit | Attending: Specialist | Admitting: Specialist

## 2020-02-07 DIAGNOSIS — Z01812 Encounter for preprocedural laboratory examination: Secondary | ICD-10-CM | POA: Diagnosis not present

## 2020-02-07 DIAGNOSIS — Z20822 Contact with and (suspected) exposure to covid-19: Secondary | ICD-10-CM | POA: Diagnosis not present

## 2020-02-07 LAB — SARS CORONAVIRUS 2 (TAT 6-24 HRS): SARS Coronavirus 2: NEGATIVE

## 2020-02-07 NOTE — H&P (Addendum)
TOTAL KNEE ADMISSION H&P  Patient is being admitted for left total knee arthroplasty.  Subjective:  Chief Complaint:left knee pain.  HPI: Colton Miranda, 79 y.o. male, has a history of pain and functional disability in the left knee due to arthritis and has failed non-surgical conservative treatments for greater than 12 weeks to includeNSAID's and/or analgesics, corticosteriod injections, viscosupplementation injections, use of assistive devices and activity modification.  Onset of symptoms was gradual, starting 4 years ago with gradually worsening course since that time. The patient noted prior procedures on the knee to include  arthroscopy and menisectomy on the left knee(s).  Patient currently rates pain in the left knee(s) at 5 out of 10 with activity. Patient has night pain, worsening of pain with activity and weight bearing, pain that interferes with activities of daily living, pain with passive range of motion, crepitus and joint swelling.  Patient has evidence of subchondral cysts, subchondral sclerosis, periarticular osteophytes and joint space narrowing by imaging studies. This patient has had no previous injury. There is no active infection.  Patient Active Problem List   Diagnosis Date Noted  . Contact dermatitis 01/19/2020  . PAF (paroxysmal atrial fibrillation) ([place]) 04/14/2018  . Malignant neoplasm of prostate ([place]) 10/07/2017  . Colon polyps 11/26/2016  . Influenza vaccination declined 05/28/2016  . History of fall 07/25/2015  . Palpitations 11/08/2014  . Pain of left arm 11/08/2014  . Pain in joint, lower leg 02/28/2014  . Essential hypertension 02/28/2014  . Disturbance of skin sensation 02/23/2013  . Insomnia, unspecified 02/23/2013  . Obesity   . DM (diabetes mellitus), secondary, uncontrolled, with peripheral vascular complications ([place])   . Hyperlipemia   . Urinary frequency   . Primary hypercoagulable state ([place]) 01/03/2013  . Other pulmonary embolism and  infarction 01/03/2013  . Long term current use of anticoagulant therapy 01/03/2013   Past Medical History:  Diagnosis Date  . BPH associated with nocturia   . Chronic constipation   . COPD (chronic obstructive pulmonary disease) ([place])   . Eczema   . Heart murmur    at birth  . History of adenomatous polyp of colon   . History of basal cell carcinoma (BCC) of skin    post excision from trunk  . 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 20 years ago , 1999, caused by blood clot after knee scope"  DVT to PE  . History of depression    over 40 years ago nervous breakdown  . History of DVT (deep vein thrombosis)    11-06-2017 per pt and wife happened lower leg approx. 1999 after knee scope  . History of pulmonary embolus (PE)    11-06-2017 from DVT in approx. 1999 per wife and pt  . HOH (hard of hearing)   . Hyperlipidemia   . Hypertension    followed by pcp  . Long term (current) use of anticoagulants Coumadin-- followed by Pharmasist w/ [facility]   secondary to primary hypercoagulopathy w/ hx DVT and PE  . Myalgia and myositis, unspecified   . OA (osteoarthritis)    "all joints"  . PAF (paroxysmal atrial fibrillation) ([place]) 2019  . Peripheral neuropathy   . Pinched nerve in neck    per pt causes intermittant numbness bilateral arms and hands  . Primary hypercoagulable state ([place])   . Prostate cancer [facility]) urologist-  dr Alyson Ingles  oncologist-- dr Tammi Klippel   dx 08-28-2017  Stage  T1c, Gleason 4+4, PSA 14.1, vol 42.8cc-- planned treatment external radiation and ADT  . 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 Protein S deficiency ([place])   . Right bundle branch block   . Seborrheic dermatitis, unspecified   . Stroke ([place])   . Type 2 diabetes mellitus ([place])   . Urinary frequency   . Wears glasses     Past Surgical History:  Procedure Laterality Date  . CHOLECYSTECTOMY OPEN  1981  . COLONOSCOPY  last  one 01-28-2017  . GOLD SEED IMPLANT N/A 11/16/2017   Procedure: GOLD SEED IMPLANT;  Surgeon: Cleon Gustin, MD;  Location: [facility];  Service: Urology;  Laterality: N/A;  . KNEE ARTHROSCOPY Right 1990s  . PILONIDAL CYST EXCISION  1968  . PROSTATE BIOPSY  09/10/2017    (done in office)   + Pos for Prostate Cancer, Dr.MCkinzie( [facility])   . SPACE OAR INSTILLATION N/A 11/16/2017   Procedure: SPACE OAR INSTILLATION;  Surgeon: Cleon Gustin, MD;  Location: [facility];  Service: Urology;  Laterality: N/A;  . TOTAL HIP ARTHROPLASTY Right 06-18-2009   dr Wynelle Link  [facility]    No current facility-administered medications for this encounter.   Current Outpatient Medications  Medication Sig Dispense Refill Last Dose  . acetaminophen (TYLENOL) 500 MG tablet Take 1,000 mg by mouth every 6 (six) hours as needed for moderate pain.     Marland Kitchen alfuzosin (UROXATRAL) 10 MG 24 hr tablet Take 1 tablet (10 mg total) by mouth daily with breakfast. 30 tablet 5   . Cholecalciferol (VITAMIN D3) 50 MCG (2000 UT) TABS Take 2,000 Units by mouth daily.      . diclofenac Sodium (VOLTAREN) 1 % GEL Apply 1 application topically at bedtime.     . diphenhydrAMINE (SOMINEX) 25 MG tablet Take 25 mg by mouth at bedtime as needed for sleep.     Marland Kitchen lisinopril (PRINIVIL,ZESTRIL) 20 MG tablet Take 1 tablet (20 mg total) by mouth daily. 90 tablet 1   . metFORMIN (GLUCOPHAGE) 500 MG tablet TAKE 1 TABLET BY MOUTH ONCE DAILY WITH BREAKFAST (Patient taking differently: Take 500 mg by mouth daily with breakfast. ) 90 tablet 1   . nystatin cream (MYCOSTATIN) Apply 1 application topically daily as needed (yeast).     Marland Kitchen OVER THE COUNTER MEDICATION Apply 1 application topically every other day. Foot miracle cream - apply every other night     . senna-docusate (SENOKOT-S) 8.6-50 MG tablet Take 1 tablet by mouth at bedtime.     . simvastatin (ZOCOR) 20 MG tablet TAKE 1 TABLET BY MOUTH ONCE DAILY FOR  CHOLESTEROL (Patient taking differently: Take 20 mg by mouth daily. ) 90 tablet 1   . tolnaftate (TINACTIN) 1 % cream Apply 1 application topically every other day. At night     . traMADol (ULTRAM) 50 MG tablet Take one to two tablets by mouth every 6 hours as needed for pain. Do not take more than 6 tablets in 24 hours (Patient taking differently: Take 50-100 mg by mouth every 6 (six) hours as needed for moderate pain (for pain- not to exceed 6 tablets in 24 hours). ) 120 tablet 0   . warfarin (COUMADIN) 10 MG tablet TAKE 1 TABLET BY MOUTH ONCE DAILY ALONG  WITH  A  5  MG  TABLET  FOR  A  TOTAL  OF  15  MG  DAILY (Patient taking differently: Take 10 mg by mouth See admin instructions. Take 10  mg with the 7.5 mg to equal 17.5 mg after supper on Sun/Mon/Wed/Fri/Sat and 10 mg with the 5 mg to equal 15 mg after supper on Tues/Thurs) 90 tablet 1   . warfarin (COUMADIN) 5 MG tablet Take 5-7.5 mg by mouth See admin instructions. Take 7.5 mg with the 10 mg to equal 17.5 mg after supper on Sun/Mon/Wed/Fri/Sat and 5 mg with the 10 mg to equal 15 mg after supper on Tues/Thurs     . Blood Glucose Monitoring Suppl (ONETOUCH VERIO) w/Device KIT by Does not apply route. Check blood sugar twice daily as directed DX E11.65     . enoxaparin (LOVENOX) 100 MG/ML injection Inject 100 mg into the skin every 12 (twelve) hours.     Glory Rosebush DELICA LANCETS 09N MISC USE 1  TO CHECK GLUCOSE TWICE DAILY (Patient taking differently: 2 (two) times daily. ) 100 each 12   . ONETOUCH VERIO test strip USE 1 STRIP TO CHECK GLUCOSE TWICE DAILY AS DIRECTED (Patient taking differently: 2 (two) times daily. ) 100 each 11    No Known Allergies  Social History   Tobacco Use  . Smoking status: Former Smoker    Years: 50.00    Types: Cigarettes    Quit date: 01/03/2009    Years since quitting: 11.1  . Smokeless tobacco: Never Used  Substance Use Topics  . Alcohol use: Yes    Alcohol/week: 3.0 standard drinks    Types: 3 Cans of beer  per week    Comment: 1-2 beers a week    Family History  Problem Relation Age of Onset  . Cancer Mother 95       colon , breast  . Colon cancer Mother   . Heart attack Daughter        2008  . Parkinson's disease Sister   . Cancer Sister        breast  . Cancer Maternal Uncle        melanoma  . Cancer Paternal Uncle      Review of Systems  Constitutional: Negative.   HENT: Positive for hearing loss and tinnitus.   Eyes: Positive for visual disturbance.  Respiratory: Positive for shortness of breath.   Cardiovascular: Positive for palpitations.  Gastrointestinal: Positive for constipation.  Endocrine: Negative.   Genitourinary: Positive for dysuria, frequency and urgency.  Musculoskeletal: Positive for arthralgias and joint swelling.  Skin: Negative.   Allergic/Immunologic: Negative.   Neurological: Negative.   Psychiatric/Behavioral: Negative.     Objective:  Physical Exam  Constitutional: He is oriented to person, place, and time. He appears well-developed and well-nourished. No distress.  HENT:  Head: Normocephalic and atraumatic.  Eyes: Pupils are equal, round, and reactive to light. EOM are normal.  Neck: No JVD present. No tracheal deviation present. No thyromegaly present.  Cardiovascular: Normal rate, regular rhythm and intact distal pulses. Exam reveals no gallop and no friction rub.  No murmur heard. Respiratory: Effort normal and breath sounds normal. No stridor. No respiratory distress. He has no wheezes. He has no rales. He exhibits no tenderness.  Musculoskeletal:     Cervical back: Normal range of motion and neck supple.     Comments: Tenderness with palpation over medial and lateral joint line Decreased ROM NVI in left lower extremity   Lymphadenopathy:    He has no cervical adenopathy.  Neurological: He is alert and oriented to person, place, and time.  Skin: Skin is warm and dry. No rash noted. He is not diaphoretic.  No erythema. No pallor.   Psychiatric: He has a normal mood and affect. His behavior is normal. Judgment and thought content normal.    Vital signs in last 24 hours:    Labs:   Estimated body mass index is 40.51 kg/m as calculated from the following:   Height as of 02/01/20: 5' 4"  (1.626 m).   Weight as of 02/01/20: 107 kg.   Imaging Review Plain radiographs demonstrate severe degenerative joint disease of the left knee(s). The overall alignment isneutral. The bone quality appears to be fair for age and reported activity level.      Assessment/Plan:  End stage arthritis, left knee   The patient history, physical examination, clinical judgment of the provider and imaging studies are consistent with end stage degenerative joint disease of the left knee(s) and total knee arthroplasty is deemed medically necessary. The treatment options including medical management, injection therapy arthroscopy and arthroplasty were discussed at length. The risks and benefits of total knee arthroplasty were presented and reviewed. The risks due to aseptic loosening, infection, stiffness, patella tracking problems, thromboembolic complications and other imponderables were discussed. The patient acknowledged the explanation, agreed to proceed with the plan and consent was signed. Patient is being admitted for inpatient treatment for surgery, pain control, PT, OT, prophylactic antibiotics, VTE prophylaxis, progressive ambulation and ADL's and discharge planning. The patient is planning to be discharged home with home health services     Patient's anticipated LOS is less than 2 midnights, meeting these requirements: - Younger than 61 - Lives within 1 hour of care - Has a competent adult at home to recover with post-op recover - NO history of  - Chronic pain requiring opiods  - Diabetes  - Coronary Artery Disease  - Heart failure  - Heart attack  - Stroke  - DVT/VTE  - Cardiac arrhythmia  - Respiratory Failure/COPD  -  Renal failure  - Anemia  - Advanced Liver disease

## 2020-02-07 NOTE — Progress Notes (Signed)
Anesthesia Chart Review   Case: [id number] Date/Time: 02/10/20 0935   Procedure: TOTAL KNEE ARTHROPLASTY (Left Knee) - adductor canale   Anesthesia type: Spinal   Pre-op diagnosis: Left knee osteoarthris   Location: [facility] ROOM 07 / [facility]   Surgeons: Sydnee Cabal, MD      DISCUSSION:79 y.o. former smoker (quit 01/03/2009) with h/o HTN, HLD, COPD, DM II, DVT, PE, PAF, hypercoaguable state due to protein C and S deficiency (on Coumadin), chronic sinus bradycardia, RBBB, prostate cancer, left knee OA scheduled for above procedure 02/10/20 with Dr. Sydnee Cabal.   Coumadin managed by PCP, Dr. Brigitte Pulse.  Last dose of Coumadin 02/04/2020 with Lovenox bridge.   Last seen by cardiology 09/21/2019, stable at this visit. 1 year follow up recommended.   Anticipate pt can proceed with planned procedure barring acute status change.   VS: BP (!) 147/60   Pulse 65   Temp 36.6 C (Oral)   Resp 16   Ht 5' 4"  (1.626 m)   Wt 107 kg   SpO2 98%   BMI 40.51 kg/m   PROVIDERS: Marton Redwood, MD is PCP with [facility]   [name], MD is Cardiologist last seen 09/21/2019 LABS: Labs reviewed: Acceptable for surgery. (all labs ordered are listed, but only abnormal results are displayed)  Labs Reviewed  APTT - Abnormal; Notable for the following components:      Result Value   aPTT 41 (*)    All other components within normal limits  CBC - Abnormal; Notable for the following components:   RBC 4.15 (*)    Hemoglobin 11.6 (*)    HCT 36.4 (*)    All other components within normal limits  COMPREHENSIVE METABOLIC PANEL - Abnormal; Notable for the following components:   Potassium 5.4 (*)    Glucose, Bld 130 (*)    All other components within normal limits  PROTIME-INR - Abnormal; Notable for the following components:   Prothrombin Time 25.0 (*)    INR 2.4 (*)    All other components within normal limits  HEMOGLOBIN A1C - Abnormal; Notable for the following components:   Hgb A1c MFr Bld 6.7 (*)     All other components within normal limits  GLUCOSE, CAPILLARY - Abnormal; Notable for the following components:   Glucose-Capillary 124 (*)    All other components within normal limits  SURGICAL PCR SCREEN  TYPE AND SCREEN     IMAGES:   EKG: 09/21/2019 Rate 75 bpm  Normal sinus rhythm at the rate of 75 bpm, normal axis, right bundle branch block.  PACs  CV:  Past Medical History:  Diagnosis Date  . BPH associated with nocturia   . Chronic constipation   . COPD (chronic obstructive pulmonary disease) ([place])   . Eczema   . Heart murmur    at birth  . History of adenomatous polyp of colon   . History of basal cell carcinoma (BCC) of skin    post excision from trunk  . 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 20 years ago , 1999, caused by blood clot after knee scope"  DVT to PE  . History of depression    over 40 years ago nervous breakdown  . History of DVT (deep vein thrombosis)    11-06-2017 per pt and wife happened lower leg approx. 1999 after knee scope  . History of pulmonary embolus (PE)    11-06-2017 from DVT  in approx. 1999 per wife and pt  . HOH (hard of hearing)   . Hyperlipidemia   . Hypertension    followed by pcp  . Long term (current) use of anticoagulants Coumadin-- followed by Pharmasist w/ [facility]   secondary to primary hypercoagulopathy w/ hx DVT and PE  . Myalgia and myositis, unspecified   . OA (osteoarthritis)    "all joints"  . PAF (paroxysmal atrial fibrillation) ([place]) 2019  . Peripheral neuropathy   . Pinched nerve in neck    per pt causes intermittant numbness bilateral arms and hands  . Primary hypercoagulable state ([place])   . Prostate cancer [facility]) urologist-  dr Alyson Ingles  oncologist-- dr Tammi Klippel   dx 08-28-2017  Stage T1c, Gleason 4+4, PSA 14.1, vol 42.8cc-- planned treatment external radiation and ADT  . 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 Protein S  deficiency ([place])   . Right bundle branch block   . Seborrheic dermatitis, unspecified   . Stroke ([place])   . Type 2 diabetes mellitus ([place])   . Urinary frequency   . Wears glasses     Past Surgical History:  Procedure Laterality Date  . CHOLECYSTECTOMY OPEN  1981  . COLONOSCOPY  last one 01-28-2017  . GOLD SEED IMPLANT N/A 11/16/2017   Procedure: GOLD SEED IMPLANT;  Surgeon: Cleon Gustin, MD;  Location: [facility];  Service: Urology;  Laterality: N/A;  . KNEE ARTHROSCOPY Right 1990s  . PILONIDAL CYST EXCISION  1968  . PROSTATE BIOPSY  09/10/2017    (done in office)   + Pos for Prostate Cancer, Dr.MCkinzie( [facility])   . SPACE OAR INSTILLATION N/A 11/16/2017   Procedure: SPACE OAR INSTILLATION;  Surgeon: Cleon Gustin, MD;  Location: [facility];  Service: Urology;  Laterality: N/A;  . TOTAL HIP ARTHROPLASTY Right 06-18-2009   dr Wynelle Link  [facility]    MEDICATIONS: . acetaminophen (TYLENOL) 500 MG tablet  . alfuzosin (UROXATRAL) 10 MG 24 hr tablet  . Blood Glucose Monitoring Suppl (ONETOUCH VERIO) w/Device KIT  . Cholecalciferol (VITAMIN D3) 50 MCG (2000 UT) TABS  . diclofenac Sodium (VOLTAREN) 1 % GEL  . diphenhydrAMINE (SOMINEX) 25 MG tablet  . enoxaparin (LOVENOX) 100 MG/ML injection  . lisinopril (PRINIVIL,ZESTRIL) 20 MG tablet  . metFORMIN (GLUCOPHAGE) 500 MG tablet  . nystatin cream (MYCOSTATIN)  . ONETOUCH DELICA LANCETS 31S MISC  . ONETOUCH VERIO test strip  . OVER THE COUNTER MEDICATION  . senna-docusate (SENOKOT-S) 8.6-50 MG tablet  . simvastatin (ZOCOR) 20 MG tablet  . tolnaftate (TINACTIN) 1 % cream  . traMADol (ULTRAM) 50 MG tablet  . warfarin (COUMADIN) 10 MG tablet  . warfarin (COUMADIN) 5 MG tablet   No current facility-administered medications for this encounter.     Maia Plan [facility] Pre-Surgical Testing [phone] 02/07/20  4:00 PM

## 2020-02-09 MED ORDER — BUPIVACAINE LIPOSOME 1.3 % IJ SUSP
20.0000 mL | INTRAMUSCULAR | Status: DC
  Filled 2020-02-09: qty 20

## 2020-02-10 ENCOUNTER — Ambulatory Visit (HOSPITAL_COMMUNITY): Payer: PPO | Admitting: Physician Assistant

## 2020-02-10 ENCOUNTER — Observation Stay (HOSPITAL_COMMUNITY)
Admission: RE | Admit: 2020-02-10 | Discharge: 2020-02-13 | Disposition: A | Discharge status: Home Health | Payer: PPO | Attending: Specialist | Admitting: Specialist

## 2020-02-10 ENCOUNTER — Ambulatory Visit (HOSPITAL_COMMUNITY): Payer: PPO | Admitting: Certified Registered Nurse Anesthetist

## 2020-02-10 ENCOUNTER — Encounter (HOSPITAL_COMMUNITY): Payer: Self-pay | Admitting: Specialist

## 2020-02-10 ENCOUNTER — Encounter (HOSPITAL_COMMUNITY)
Admission: RE | Disposition: A | Discharge status: Home Health | Payer: Self-pay | Source: Home / Self Care | Attending: Specialist

## 2020-02-10 ENCOUNTER — Other Ambulatory Visit: Payer: Self-pay

## 2020-02-10 DIAGNOSIS — E669 Obesity, unspecified: Secondary | ICD-10-CM | POA: Diagnosis not present

## 2020-02-10 DIAGNOSIS — E1151 Type 2 diabetes mellitus with diabetic peripheral angiopathy without gangrene: Secondary | ICD-10-CM | POA: Diagnosis not present

## 2020-02-10 DIAGNOSIS — Z86711 Personal history of pulmonary embolism: Secondary | ICD-10-CM | POA: Insufficient documentation

## 2020-02-10 DIAGNOSIS — Z7901 Long term (current) use of anticoagulants: Secondary | ICD-10-CM | POA: Diagnosis not present

## 2020-02-10 DIAGNOSIS — I480 Paroxysmal atrial fibrillation: Secondary | ICD-10-CM | POA: Insufficient documentation

## 2020-02-10 DIAGNOSIS — I110 Hypertensive heart disease with heart failure: Secondary | ICD-10-CM | POA: Insufficient documentation

## 2020-02-10 DIAGNOSIS — Z79899 Other long term (current) drug therapy: Secondary | ICD-10-CM | POA: Insufficient documentation

## 2020-02-10 DIAGNOSIS — Z8673 Personal history of transient ischemic attack (TIA), and cerebral infarction without residual deficits: Secondary | ICD-10-CM | POA: Insufficient documentation

## 2020-02-10 DIAGNOSIS — I10 Essential (primary) hypertension: Secondary | ICD-10-CM | POA: Diagnosis not present

## 2020-02-10 DIAGNOSIS — Z87891 Personal history of nicotine dependence: Secondary | ICD-10-CM | POA: Diagnosis not present

## 2020-02-10 DIAGNOSIS — M1712 Unilateral primary osteoarthritis, left knee: Principal | ICD-10-CM | POA: Insufficient documentation

## 2020-02-10 DIAGNOSIS — E1142 Type 2 diabetes mellitus with diabetic polyneuropathy: Secondary | ICD-10-CM | POA: Diagnosis not present

## 2020-02-10 DIAGNOSIS — Z86718 Personal history of other venous thrombosis and embolism: Secondary | ICD-10-CM | POA: Insufficient documentation

## 2020-02-10 DIAGNOSIS — G8918 Other acute postprocedural pain: Secondary | ICD-10-CM | POA: Diagnosis not present

## 2020-02-10 DIAGNOSIS — E785 Hyperlipidemia, unspecified: Secondary | ICD-10-CM | POA: Diagnosis not present

## 2020-02-10 DIAGNOSIS — Z7984 Long term (current) use of oral hypoglycemic drugs: Secondary | ICD-10-CM | POA: Insufficient documentation

## 2020-02-10 DIAGNOSIS — Z8546 Personal history of malignant neoplasm of prostate: Secondary | ICD-10-CM | POA: Insufficient documentation

## 2020-02-10 HISTORY — PX: TOTAL KNEE ARTHROPLASTY: SHX125

## 2020-02-10 LAB — TYPE AND SCREEN
ABO/RH(D): O POS
Antibody Screen: NEGATIVE

## 2020-02-10 LAB — GLUCOSE, CAPILLARY
Glucose-Capillary: 109 mg/dL — ABNORMAL HIGH (ref 70–99)
Glucose-Capillary: 160 mg/dL — ABNORMAL HIGH (ref 70–99)

## 2020-02-10 SURGERY — ARTHROPLASTY, KNEE, TOTAL
Anesthesia: Regional | Site: Knee | Laterality: Left

## 2020-02-10 MED ORDER — EPHEDRINE 5 MG/ML INJ
INTRAVENOUS | Status: AC
  Filled 2020-02-10: qty 10

## 2020-02-10 MED ORDER — POVIDONE-IODINE 10 % EX SWAB
2.0000 "application " | Freq: Once | CUTANEOUS | Status: AC
  Administered 2020-02-10: 2 via TOPICAL

## 2020-02-10 MED ORDER — HYDROMORPHONE HCL 1 MG/ML IJ SOLN
0.5000 mg | INTRAMUSCULAR | Status: DC | PRN
  Administered 2020-02-10 (×3): .5 mg via INTRAVENOUS

## 2020-02-10 MED ORDER — PHENYLEPHRINE 40 MCG/ML (10ML) SYRINGE FOR IV PUSH (FOR BLOOD PRESSURE SUPPORT)
PREFILLED_SYRINGE | INTRAVENOUS | Status: DC | PRN
  Administered 2020-02-10: 80 ug via INTRAVENOUS

## 2020-02-10 MED ORDER — GABAPENTIN 300 MG PO CAPS
300.0000 mg | ORAL_CAPSULE | Freq: Three times a day (TID) | ORAL | Status: DC
  Administered 2020-02-10 – 2020-02-13 (×9): 300 mg via ORAL
  Filled 2020-02-10 (×9): qty 1

## 2020-02-10 MED ORDER — 0.9 % SODIUM CHLORIDE (POUR BTL) OPTIME
TOPICAL | Status: DC | PRN
  Administered 2020-02-10: 1000 mL

## 2020-02-10 MED ORDER — LACTATED RINGERS IV SOLN: INTRAVENOUS | Status: DC

## 2020-02-10 MED ORDER — METFORMIN HCL 500 MG PO TABS
500.0000 mg | ORAL_TABLET | Freq: Every day | ORAL | Status: DC
  Administered 2020-02-11 – 2020-02-13 (×3): 500 mg via ORAL
  Filled 2020-02-10 (×3): qty 1

## 2020-02-10 MED ORDER — FENTANYL CITRATE (PF) 100 MCG/2ML IJ SOLN
INTRAMUSCULAR | Status: AC
  Administered 2020-02-10: 50 ug via INTRAVENOUS
  Filled 2020-02-10: qty 2

## 2020-02-10 MED ORDER — PHENYLEPHRINE HCL-NACL 10-0.9 MG/250ML-% IV SOLN
INTRAVENOUS | Status: DC | PRN
  Administered 2020-02-10: 40 ug/min via INTRAVENOUS

## 2020-02-10 MED ORDER — SODIUM CHLORIDE 0.9 % IR SOLN
Status: DC | PRN
  Administered 2020-02-10: 1000 mL

## 2020-02-10 MED ORDER — PHENYLEPHRINE HCL (PRESSORS) 10 MG/ML IV SOLN
INTRAVENOUS | Status: AC
  Filled 2020-02-10: qty 1

## 2020-02-10 MED ORDER — OXYCODONE HCL 5 MG PO TABS
5.0000 mg | ORAL_TABLET | ORAL | Status: DC | PRN
  Administered 2020-02-10 (×2): 5 mg via ORAL
  Administered 2020-02-11: 10 mg via ORAL
  Administered 2020-02-11: 5 mg via ORAL
  Administered 2020-02-11 – 2020-02-12 (×3): 10 mg via ORAL
  Administered 2020-02-13: 5 mg via ORAL
  Filled 2020-02-10 (×2): qty 2
  Filled 2020-02-10 (×3): qty 1
  Filled 2020-02-10: qty 2
  Filled 2020-02-10: qty 1
  Filled 2020-02-10 (×2): qty 2

## 2020-02-10 MED ORDER — SODIUM CHLORIDE (PF) 0.9 % IJ SOLN
INTRAMUSCULAR | Status: DC | PRN
  Administered 2020-02-10: 60 mL

## 2020-02-10 MED ORDER — FENTANYL CITRATE (PF) 100 MCG/2ML IJ SOLN
50.0000 ug | Freq: Once | INTRAMUSCULAR | Status: AC
  Administered 2020-02-10: 50 ug via INTRAVENOUS
  Filled 2020-02-10: qty 2

## 2020-02-10 MED ORDER — HYDRALAZINE HCL 20 MG/ML IJ SOLN
INTRAMUSCULAR | Status: AC
  Filled 2020-02-10: qty 1

## 2020-02-10 MED ORDER — SODIUM CHLORIDE (PF) 0.9 % IJ SOLN
INTRAMUSCULAR | Status: AC
  Filled 2020-02-10: qty 10

## 2020-02-10 MED ORDER — DIPHENHYDRAMINE HCL 12.5 MG/5ML PO ELIX: 12.5000 mg | ORAL_SOLUTION | ORAL | Status: DC | PRN

## 2020-02-10 MED ORDER — SUGAMMADEX SODIUM 500 MG/5ML IV SOLN
INTRAVENOUS | Status: AC
  Filled 2020-02-10: qty 5

## 2020-02-10 MED ORDER — PROPOFOL 10 MG/ML IV BOLUS
INTRAVENOUS | Status: AC
  Filled 2020-02-10: qty 20

## 2020-02-10 MED ORDER — CEFAZOLIN SODIUM-DEXTROSE 1-4 GM/50ML-% IV SOLN
1.0000 g | Freq: Three times a day (TID) | INTRAVENOUS | Status: AC
  Administered 2020-02-10 – 2020-02-11 (×3): 1 g via INTRAVENOUS
  Filled 2020-02-10 (×3): qty 50

## 2020-02-10 MED ORDER — PHENYLEPHRINE 40 MCG/ML (10ML) SYRINGE FOR IV PUSH (FOR BLOOD PRESSURE SUPPORT)
PREFILLED_SYRINGE | INTRAVENOUS | Status: AC
  Filled 2020-02-10: qty 10

## 2020-02-10 MED ORDER — ACETAMINOPHEN 500 MG PO TABS
1000.0000 mg | ORAL_TABLET | Freq: Once | ORAL | Status: AC
  Administered 2020-02-10: 1000 mg via ORAL

## 2020-02-10 MED ORDER — ACETAMINOPHEN 500 MG PO TABS
1000.0000 mg | ORAL_TABLET | Freq: Four times a day (QID) | ORAL | Status: AC
  Administered 2020-02-10 – 2020-02-11 (×3): 1000 mg via ORAL
  Filled 2020-02-10 (×3): qty 2

## 2020-02-10 MED ORDER — ACETAMINOPHEN 325 MG PO TABS
325.0000 mg | ORAL_TABLET | Freq: Four times a day (QID) | ORAL | Status: DC | PRN
  Administered 2020-02-12 – 2020-02-13 (×2): 650 mg via ORAL
  Filled 2020-02-10 (×2): qty 2

## 2020-02-10 MED ORDER — ONDANSETRON HCL 4 MG PO TABS: 4.0000 mg | ORAL_TABLET | Freq: Every day | ORAL | 1 refills | Status: DC | PRN

## 2020-02-10 MED ORDER — OXYCODONE HCL 5 MG PO TABS: 5.0000 mg | ORAL_TABLET | ORAL | 0 refills | Status: DC | PRN

## 2020-02-10 MED ORDER — STERILE WATER FOR IRRIGATION IR SOLN
Status: DC | PRN
  Administered 2020-02-10 (×2): 1000 mL

## 2020-02-10 MED ORDER — OXYCODONE HCL 5 MG PO TABS
10.0000 mg | ORAL_TABLET | ORAL | Status: DC | PRN
  Administered 2020-02-12: 10 mg via ORAL

## 2020-02-10 MED ORDER — CEFAZOLIN SODIUM-DEXTROSE 2-4 GM/100ML-% IV SOLN
2.0000 g | INTRAVENOUS | Status: AC
  Administered 2020-02-10: 2 g via INTRAVENOUS
  Filled 2020-02-10: qty 100

## 2020-02-10 MED ORDER — WARFARIN SODIUM 5 MG PO TABS: 15.0000 mg | ORAL_TABLET | ORAL | Status: DC

## 2020-02-10 MED ORDER — MAGNESIUM CITRATE PO SOLN: 1.0000 | Freq: Once | ORAL | Status: DC | PRN

## 2020-02-10 MED ORDER — LABETALOL HCL 5 MG/ML IV SOLN
INTRAVENOUS | Status: AC
  Filled 2020-02-10: qty 4

## 2020-02-10 MED ORDER — METHOCARBAMOL 500 MG PO TABS: 500.0000 mg | ORAL_TABLET | Freq: Four times a day (QID) | ORAL | 0 refills | Status: DC

## 2020-02-10 MED ORDER — SIMVASTATIN 20 MG PO TABS
20.0000 mg | ORAL_TABLET | Freq: Every day | ORAL | Status: DC
  Administered 2020-02-11 – 2020-02-12 (×2): 20 mg via ORAL
  Filled 2020-02-10 (×2): qty 1

## 2020-02-10 MED ORDER — LIDOCAINE 2% (20 MG/ML) 5 ML SYRINGE
INTRAMUSCULAR | Status: DC | PRN
  Administered 2020-02-10: 60 mg via INTRAVENOUS

## 2020-02-10 MED ORDER — MIDAZOLAM HCL 2 MG/2ML IJ SOLN
1.0000 mg | INTRAMUSCULAR | Status: DC
  Filled 2020-02-10: qty 2

## 2020-02-10 MED ORDER — LIDOCAINE 2% (20 MG/ML) 5 ML SYRINGE
INTRAMUSCULAR | Status: AC
  Filled 2020-02-10: qty 5

## 2020-02-10 MED ORDER — WARFARIN SODIUM 5 MG PO TABS
17.5000 mg | ORAL_TABLET | ORAL | Status: DC
  Administered 2020-02-11 – 2020-02-12 (×2): 17.5 mg via ORAL
  Filled 2020-02-10 (×2): qty 3

## 2020-02-10 MED ORDER — ONDANSETRON HCL 4 MG/2ML IJ SOLN: 4.0000 mg | Freq: Four times a day (QID) | INTRAMUSCULAR | Status: DC | PRN

## 2020-02-10 MED ORDER — IRRISEPT - 450ML BOTTLE WITH 0.05% CHG IN STERILE WATER, USP 99.95% OPTIME
TOPICAL | Status: DC | PRN
  Administered 2020-02-10: 250 mL

## 2020-02-10 MED ORDER — ONDANSETRON HCL 4 MG PO TABS: 4.0000 mg | ORAL_TABLET | Freq: Four times a day (QID) | ORAL | Status: DC | PRN

## 2020-02-10 MED ORDER — WARFARIN - PHYSICIAN DOSING INPATIENT: Freq: Every day | Status: DC

## 2020-02-10 MED ORDER — METHOCARBAMOL 500 MG PO TABS
500.0000 mg | ORAL_TABLET | Freq: Four times a day (QID) | ORAL | Status: DC | PRN
  Administered 2020-02-12: 500 mg via ORAL
  Filled 2020-02-10: qty 1

## 2020-02-10 MED ORDER — ENOXAPARIN SODIUM 100 MG/ML ~~LOC~~ SOLN
100.0000 mg | Freq: Two times a day (BID) | SUBCUTANEOUS | Status: DC
  Administered 2020-02-11 – 2020-02-13 (×5): 100 mg via SUBCUTANEOUS
  Filled 2020-02-10 (×5): qty 1

## 2020-02-10 MED ORDER — HYDROMORPHONE HCL 2 MG/ML IJ SOLN
INTRAMUSCULAR | Status: AC
  Filled 2020-02-10: qty 1

## 2020-02-10 MED ORDER — BISACODYL 5 MG PO TBEC: 5.0000 mg | DELAYED_RELEASE_TABLET | Freq: Every day | ORAL | Status: DC | PRN

## 2020-02-10 MED ORDER — GLYCOPYRROLATE PF 0.2 MG/ML IJ SOSY
PREFILLED_SYRINGE | INTRAMUSCULAR | Status: AC
  Filled 2020-02-10: qty 1

## 2020-02-10 MED ORDER — DEXAMETHASONE SODIUM PHOSPHATE 10 MG/ML IJ SOLN
8.0000 mg | Freq: Once | INTRAMUSCULAR | Status: AC
  Administered 2020-02-10: 10 mg via INTRAVENOUS

## 2020-02-10 MED ORDER — ONDANSETRON HCL 4 MG/2ML IJ SOLN
INTRAMUSCULAR | Status: AC
  Filled 2020-02-10: qty 2

## 2020-02-10 MED ORDER — METHOCARBAMOL 500 MG IVPB - SIMPLE MED
500.0000 mg | Freq: Four times a day (QID) | INTRAVENOUS | Status: DC | PRN
  Filled 2020-02-10: qty 50

## 2020-02-10 MED ORDER — FENTANYL CITRATE (PF) 100 MCG/2ML IJ SOLN
INTRAMUSCULAR | Status: AC
  Filled 2020-02-10: qty 2

## 2020-02-10 MED ORDER — TRAMADOL HCL 50 MG PO TABS
50.0000 mg | ORAL_TABLET | Freq: Four times a day (QID) | ORAL | Status: DC
  Administered 2020-02-10 – 2020-02-13 (×10): 50 mg via ORAL
  Filled 2020-02-10 (×10): qty 1

## 2020-02-10 MED ORDER — DEXAMETHASONE SODIUM PHOSPHATE 10 MG/ML IJ SOLN
INTRAMUSCULAR | Status: DC | PRN
  Administered 2020-02-10: 5 mg

## 2020-02-10 MED ORDER — SODIUM CHLORIDE (PF) 0.9 % IJ SOLN
INTRAMUSCULAR | Status: AC
  Filled 2020-02-10: qty 50

## 2020-02-10 MED ORDER — PROPOFOL 10 MG/ML IV BOLUS
INTRAVENOUS | Status: DC | PRN
  Administered 2020-02-10: 150 mg via INTRAVENOUS
  Administered 2020-02-10: 150 ug/kg/min via INTRAVENOUS

## 2020-02-10 MED ORDER — SENNOSIDES-DOCUSATE SODIUM 8.6-50 MG PO TABS
1.0000 | ORAL_TABLET | Freq: Every evening | ORAL | Status: DC | PRN
  Administered 2020-02-13: 1 via ORAL
  Filled 2020-02-10: qty 1

## 2020-02-10 MED ORDER — PROPOFOL 1000 MG/100ML IV EMUL
INTRAVENOUS | Status: AC
  Filled 2020-02-10: qty 200

## 2020-02-10 MED ORDER — ONDANSETRON HCL 4 MG/2ML IJ SOLN
INTRAMUSCULAR | Status: DC | PRN
  Administered 2020-02-10: 4 mg via INTRAVENOUS

## 2020-02-10 MED ORDER — EPHEDRINE SULFATE-NACL 50-0.9 MG/10ML-% IV SOSY
PREFILLED_SYRINGE | INTRAVENOUS | Status: DC | PRN
  Administered 2020-02-10: 15 mg via INTRAVENOUS

## 2020-02-10 MED ORDER — FENTANYL CITRATE (PF) 100 MCG/2ML IJ SOLN
25.0000 ug | INTRAMUSCULAR | Status: DC | PRN
  Administered 2020-02-10: 50 ug via INTRAVENOUS

## 2020-02-10 MED ORDER — LISINOPRIL 20 MG PO TABS
20.0000 mg | ORAL_TABLET | Freq: Every day | ORAL | Status: DC
  Administered 2020-02-11 – 2020-02-13 (×2): 20 mg via ORAL
  Filled 2020-02-10 (×3): qty 1

## 2020-02-10 MED ORDER — SODIUM CHLORIDE 0.9 % IV SOLN
INTRAVENOUS | Status: DC
  Administered 2020-02-10: 50 mL/h via INTRAVENOUS

## 2020-02-10 MED ORDER — WARFARIN SODIUM 5 MG PO TABS: 5.0000 mg | ORAL_TABLET | ORAL | Status: DC

## 2020-02-10 MED ORDER — ROPIVACAINE HCL 5 MG/ML IJ SOLN
INTRAMUSCULAR | Status: DC | PRN
Start: 2020-02-10 — End: 2020-02-10
  Administered 2020-02-10: 20 mL via PERINEURAL

## 2020-02-10 MED ORDER — WARFARIN SODIUM 10 MG PO TABS: 10.0000 mg | ORAL_TABLET | ORAL | Status: DC

## 2020-02-10 MED ORDER — DEXAMETHASONE SODIUM PHOSPHATE 10 MG/ML IJ SOLN
INTRAMUSCULAR | Status: AC
  Filled 2020-02-10: qty 1

## 2020-02-10 MED ORDER — GLYCOPYRROLATE PF 0.2 MG/ML IJ SOSY
PREFILLED_SYRINGE | INTRAMUSCULAR | Status: DC | PRN
  Administered 2020-02-10: .2 mg via INTRAVENOUS

## 2020-02-10 MED ORDER — FENTANYL CITRATE (PF) 100 MCG/2ML IJ SOLN
INTRAMUSCULAR | Status: DC | PRN
  Administered 2020-02-10 (×2): 50 ug via INTRAVENOUS

## 2020-02-10 MED ORDER — SODIUM CHLORIDE 0.9 % IV SOLN: INTRAVENOUS | Status: DC | PRN

## 2020-02-10 SURGICAL SUPPLY — 70 items
ATTUNE MED DOME PAT 38 KNEE (Knees) ×2 IMPLANT
ATTUNE MED DOME PAT 38MM KNEE (Knees) ×1 IMPLANT
ATTUNE PS FEM LT SZ 7 CEM KNEE (Femur) ×3 IMPLANT
ATTUNE PSRP INSR SZ7 7 KNEE (Insert) ×2 IMPLANT
ATTUNE PSRP INSR SZ7 7MM KNEE (Insert) ×1 IMPLANT
BAG ZIPLOCK 12X15 (MISCELLANEOUS) ×3 IMPLANT
BASE TIBIAL ROT PLAT SZ 8 KNEE (Knees) ×1 IMPLANT
BLADE SAG 18X100X1.27 (BLADE) ×3 IMPLANT
BLADE SAW SGTL 11.0X1.19X90.0M (BLADE) ×3 IMPLANT
BNDG ELASTIC 4X5.8 VLCR STR LF (GAUZE/BANDAGES/DRESSINGS) ×3 IMPLANT
BNDG ELASTIC 6X5.8 VLCR STR LF (GAUZE/BANDAGES/DRESSINGS) ×3 IMPLANT
BONE CEMENT GENTAMICIN (Cement) ×6 IMPLANT
BOWL SMART MIX CTS (DISPOSABLE) ×3 IMPLANT
CEMENT BONE GENTAMICIN 40 (Cement) ×2 IMPLANT
COVER SURGICAL LIGHT HANDLE (MISCELLANEOUS) ×3 IMPLANT
COVER WAND RF STERILE (DRAPES) ×3 IMPLANT
CUFF TOURN SGL QUICK 34 (TOURNIQUET CUFF) ×2
CUFF TRNQT CYL 34X4.125X (TOURNIQUET CUFF) ×1 IMPLANT
DECANTER SPIKE VIAL GLASS SM (MISCELLANEOUS) ×3 IMPLANT
DERMABOND ADVANCED (GAUZE/BANDAGES/DRESSINGS) ×2
DERMABOND ADVANCED .7 DNX12 (GAUZE/BANDAGES/DRESSINGS) ×1 IMPLANT
DRAPE U-SHAPE 47X51 STRL (DRAPES) ×3 IMPLANT
DRSG AQUACEL AG ADV 3.5X10 (GAUZE/BANDAGES/DRESSINGS) ×3 IMPLANT
DRSG TEGADERM 2-3/8X2-3/4 SM (GAUZE/BANDAGES/DRESSINGS) ×3 IMPLANT
DRSG TEGADERM 4X4.75 (GAUZE/BANDAGES/DRESSINGS) ×3 IMPLANT
DURAPREP 26ML APPLICATOR (WOUND CARE) ×6 IMPLANT
ELECT REM PT RETURN 15FT ADLT (MISCELLANEOUS) ×3 IMPLANT
EVACUATOR 1/8 PVC DRAIN (DRAIN) ×3 IMPLANT
GAUZE SPONGE 2X2 8PLY STRL LF (GAUZE/BANDAGES/DRESSINGS) ×1 IMPLANT
GLOVE BIOGEL PI IND STRL 7.5 (GLOVE) ×1 IMPLANT
GLOVE BIOGEL PI IND STRL 8 (GLOVE) ×1 IMPLANT
GLOVE BIOGEL PI INDICATOR 7.5 (GLOVE) ×2
GLOVE BIOGEL PI INDICATOR 8 (GLOVE) ×2
GLOVE ECLIPSE 8.0 STRL XLNG CF (GLOVE) ×3 IMPLANT
GLOVE SURG ORTHO 9.0 STRL STRW (GLOVE) ×3 IMPLANT
GLOVE SURG SS PI 7.0 STRL IVOR (GLOVE) ×3 IMPLANT
GOWN STRL REUS W/TWL XL LVL3 (GOWN DISPOSABLE) ×6 IMPLANT
HANDPIECE INTERPULSE COAX TIP (DISPOSABLE) ×2
JET LAVAGE IRRISEPT WOUND (IRRIGATION / IRRIGATOR) ×3
KIT TURNOVER KIT A (KITS) ×3 IMPLANT
LAVAGE JET IRRISEPT WOUND (IRRIGATION / IRRIGATOR) ×1 IMPLANT
NS IRRIG 1000ML POUR BTL (IV SOLUTION) ×3 IMPLANT
PACK TOTAL KNEE CUSTOM (KITS) ×3 IMPLANT
PENCIL SMOKE EVACUATOR (MISCELLANEOUS) ×3 IMPLANT
PIN DRILL FIX HALF THREAD (BIT) ×3 IMPLANT
PIN STEINMAN FIXATION KNEE (PIN) ×3 IMPLANT
PROTECTOR NERVE ULNAR (MISCELLANEOUS) ×3 IMPLANT
SET HNDPC FAN SPRY TIP SCT (DISPOSABLE) ×1 IMPLANT
SET PAD KNEE POSITIONER (MISCELLANEOUS) ×3 IMPLANT
SPONGE GAUZE 2X2 STER 10/PKG (GAUZE/BANDAGES/DRESSINGS) ×2
SPONGE LAP 18X18 RF (DISPOSABLE) IMPLANT
SPONGE SURGIFOAM ABS GEL 100 (HEMOSTASIS) ×3 IMPLANT
STOCKINETTE 6  STRL (DRAPES) ×2
STOCKINETTE 6 STRL (DRAPES) ×1 IMPLANT
SUT BONE WAX W31G (SUTURE) IMPLANT
SUT MNCRL AB 3-0 PS2 18 (SUTURE) ×3 IMPLANT
SUT VIC AB 1 CT1 27 (SUTURE) ×6
SUT VIC AB 1 CT1 27XBRD ANTBC (SUTURE) ×3 IMPLANT
SUT VIC AB 1 CT1 36 (SUTURE) ×3 IMPLANT
SUT VIC AB 2-0 CT1 27 (SUTURE) ×4
SUT VIC AB 2-0 CT1 TAPERPNT 27 (SUTURE) ×2 IMPLANT
SUT VLOC 180 0 24IN GS25 (SUTURE) ×3 IMPLANT
SUT VLOC 180 ABS0 18IN GS21 (SUTURE) ×3 IMPLANT
SYR 50ML LL SCALE MARK (SYRINGE) ×3 IMPLANT
TAPE STRIPS DRAPE STRL (GAUZE/BANDAGES/DRESSINGS) ×3 IMPLANT
TIBIAL BASE ROT PLAT SZ 8 KNEE (Knees) ×3 IMPLANT
TRAY CATH 16FR W/PLASTIC CATH (SET/KITS/TRAYS/PACK) IMPLANT
WATER STERILE IRR 1000ML POUR (IV SOLUTION) ×6 IMPLANT
WRAP KNEE MAXI GEL POST OP (GAUZE/BANDAGES/DRESSINGS) ×3 IMPLANT
YANKAUER SUCT BULB TIP 10FT TU (MISCELLANEOUS) ×3 IMPLANT

## 2020-02-10 NOTE — Interval H&P Note (Signed)
History and Physical Interval Note:  02/10/2020 9:52 AM  Colton Miranda  has presented today for surgery, with the diagnosis of Left knee osteoarthris.  The various methods of treatment have been discussed with the patient and family. After consideration of risks, benefits and other options for treatment, the patient has consented to  Procedure(s) with comments: TOTAL KNEE ARTHROPLASTY (Left) - adductor canale as a surgical intervention.  The patient's history has been reviewed, patient examined, no change in status, stable for surgery.  I have reviewed the patient's chart and labs.  Questions were answered to the patient's satisfaction.     [name],[name]

## 2020-02-10 NOTE — Interval H&P Note (Signed)
History and Physical Interval Note:  02/10/2020 9:52 AM  Colton Miranda  has presented today for surgery, with the diagnosis of Left knee osteoarthris.  The various methods of treatment have been discussed with the patient and family. After consideration of risks, benefits and other options for treatment, the patient has consented to  Procedure(s) with comments: TOTAL KNEE ARTHROPLASTY (Left) - adductor canale as a surgical intervention.  The patient's history has been reviewed, patient examined, no change in status, stable for surgery.  I have reviewed the patient's chart and labs.  Questions were answered to the patient's satisfaction.     Russel Morain,[place] ANDREW

## 2020-02-10 NOTE — Interval H&P Note (Signed)
History and Physical Interval Note:  02/10/2020 9:52 AM  Colton Miranda  has presented today for surgery, with the diagnosis of Left knee osteoarthris.  The various methods of treatment have been discussed with the patient and family. After consideration of risks, benefits and other options for treatment, the patient has consented to  Procedure(s) with comments: TOTAL KNEE ARTHROPLASTY (Left) - adductor canale as a surgical intervention.  The patient's history has been reviewed, patient examined, no change in status, stable for surgery.  I have reviewed the patient's chart and labs.  Questions were answered to the patient's satisfaction.     Ramsha Lonigro,[place] ANDREW

## 2020-02-10 NOTE — Evaluation (Signed)
Physical Therapy Evaluation Patient Details Name: Colton Miranda MRN: [id number] DOB: 05-Mar-1941 Today's Date: 02/10/2020   History of Present Illness  Patient is 79 y.o. male s/p Lt TKA on 02/10/20 with PMH significant for DM, Stroke, protate cancer, PAF, OA, HTN, HLD, HOH, hx of DVT and PE, COPD, Rt THA In 2010.    Clinical Impression  Colton Miranda is a 79 y.o. male POD 0 s/p Lt TKA. Patient/family reports he requires assistance with ADL's and homemaking, and uses RW/SPC for mobility. Patient is now limited by functional impairments (see PT problem list below) and requires min assist for transfers and gait with RW. Patient was able to ambulate ~14 feet with RW and min assist. Patient instructed in exercise to facilitate ROM and circulation. Patient will benefit from continued skilled PT interventions to address impairments and progress towards PLOF. Acute PT will follow to progress mobility and stair training in preparation for safe discharge home.     Follow Up Recommendations Follow surgeon's recommendation for DC plan and follow-up therapies;Home health PT    Equipment Recommendations  None recommended by PT    Recommendations for Other Services       Precautions / Restrictions Precautions Precautions: Fall Restrictions Weight Bearing Restrictions: No      Mobility  Bed Mobility Overal bed mobility: Needs Assistance Bed Mobility: Supine to Sit     Supine to sit: Min assist;HOB elevated     General bed mobility comments: cues to reach for bed rail, assist to bring LE's off EOB  Transfers Overall transfer level: Needs assistance Equipment used: Rolling walker (2 wheeled) Transfers: Sit to/from Stand Sit to Stand: Min assist         General transfer comment: cues for hand placement and assist to complete power up and steady with rising.  Ambulation/Gait Ambulation/Gait assistance: Min assist Gait Distance (Feet): 14 Feet Assistive device: Rolling walker  (2 wheeled) Gait Pattern/deviations: Step-to pattern;Decreased step length - right;Decreased stance time - left;Decreased weight shift to left;Trunk flexed Gait velocity: decreased   General Gait Details: cues for safe step pattern and proximity to RW, assist required for safe walker management with proximity.   Stairs       Wheelchair Mobility    Modified Rankin (Stroke Patients Only)       Balance Overall balance assessment: Needs assistance Sitting-balance support: Feet supported Sitting balance-Leahy Scale: Fair     Standing balance support: During functional activity;Bilateral upper extremity supported Standing balance-Leahy Scale: Poor              Pertinent Vitals/Pain Pain Assessment: 0-10 Pain Score: 4  Pain Location: Lt knee Pain Descriptors / Indicators: Aching;Sore Pain Intervention(s): Limited activity within patient's tolerance;Monitored during session;Repositioned;Ice applied    Home Living Family/patient expects to be discharged to:: Private residence Living Arrangements: Spouse/significant other Available Help at Discharge: Family Type of Home: House Home Access: Stairs to enter Entrance Stairs-Rails: None Entrance Stairs-Number of Steps: 1+1+1 Home Layout: One level Home Equipment: Environmental consultant - 2 wheels;Cane - single point;Tub bench;Hospital bed      Prior Function Level of Independence: Independent with assistive device(s);Needs assistance   Gait / Transfers Assistance Needed: pt using RW and cane for  mobility  ADL's / Homemaking Assistance Needed: pt's wife has been assisting with bathing and getting in/out of tub safely and with dressing lower body.         Hand Dominance   Dominant Hand: Right    Extremity/Trunk Assessment   Upper Extremity  Assessment Upper Extremity Assessment: Overall WFL for tasks assessed    Lower Extremity Assessment Lower Extremity Assessment: LLE deficits/detail LLE Deficits / Details: pt able to  complete quad press and limited SLR about 3 inches off bed. slight buckling in weight bearing however pt able to use UE on RW to prevent.  LLE: Unable to fully assess due to pain LLE Sensation: WNL LLE Coordination: WNL    Cervical / Trunk Assessment Cervical / Trunk Assessment: Kyphotic  Communication   Communication: No difficulties  Cognition Arousal/Alertness: Awake/alert Behavior During Therapy: WFL for tasks assessed/performed Overall Cognitive Status: Within Functional Limits for tasks assessed               General Comments      Exercises Total Joint Exercises Ankle Circles/Pumps: AROM;Both;20 reps;Seated Quad Sets: AROM;Left;10 reps;Seated Heel Slides: AROM;Left;10 reps;Seated   Assessment/Plan    PT Assessment Patient needs continued PT services  PT Problem List Decreased strength;Decreased range of motion;Decreased activity tolerance;Decreased balance;Decreased coordination;Decreased knowledge of use of DME;Decreased knowledge of precautions;Pain       PT Treatment Interventions DME instruction;Gait training;Stair training;Functional mobility training;Therapeutic activities;Therapeutic exercise;Balance training;Patient/family education    PT Goals (Current goals can be found in the Care Plan section)  Acute Rehab PT Goals Patient Stated Goal: get back more independence PT Goal Formulation: With patient/family Time For Goal Achievement: 02/17/20 Potential to Achieve Goals: Good    Frequency 7X/week    AM-PAC PT "6 Clicks" Mobility  Outcome Measure Help needed turning from your back to your side while in a flat bed without using bedrails?: A Little Help needed moving from lying on your back to sitting on the side of a flat bed without using bedrails?: A Little Help needed moving to and from a bed to a chair (including a wheelchair)?: A Little Help needed standing up from a chair using your arms (e.g., wheelchair or bedside chair)?: A Little Help needed to  walk in hospital room?: A Little Help needed climbing 3-5 steps with a railing? : A Lot 6 Click Score: 17    End of Session Equipment Utilized During Treatment: Gait belt Activity Tolerance: Patient tolerated treatment well Patient left: in chair;with call bell/phone within reach;with chair alarm set;with family/visitor present Nurse Communication: Mobility status PT Visit Diagnosis: Muscle weakness (generalized) (M62.81);Difficulty in walking, not elsewhere classified (R26.2)    Time: [id number] PT Time Calculation (min) (ACUTE ONLY): 26 min   Charges:   PT Evaluation $PT Eval Low Complexity: 1 Low PT Treatments $Gait Training: 8-22 mins        Verner Mould, DPT Physical Therapist with [facility] [phone]  02/10/2020 5:09 PM

## 2020-02-10 NOTE — Anesthesia Postprocedure Evaluation (Signed)
Anesthesia Post Note  Patient: Colton Miranda  Procedure(s) Performed: TOTAL KNEE ARTHROPLASTY (Left Knee)     Patient location during evaluation: PACU Anesthesia Type: Regional and General Level of consciousness: awake and alert Pain management: pain level controlled Vital Signs Assessment: post-procedure vital signs reviewed and stable Respiratory status: spontaneous breathing, nonlabored ventilation, respiratory function stable and patient connected to nasal cannula oxygen Cardiovascular status: blood pressure returned to baseline and stable Postop Assessment: no apparent nausea or vomiting Anesthetic complications: no    Last Vitals:  Vitals:   02/10/20 1315 02/10/20 1330  BP: (!) 104/51 (!) 127/53  Pulse: 81 83  Resp: 11 13  Temp:    SpO2: 98% 99%    Last Pain:  Vitals:   02/10/20 1330  TempSrc:   PainSc: 7                  [place] Troia L Abhay Godbolt

## 2020-02-10 NOTE — Anesthesia Procedure Notes (Signed)
Procedure Name: LMA Insertion Date/Time: 02/10/2020 10:12 AM Performed by: [place] Mathilda Maguire C, CRNA Pre-anesthesia Checklist: Patient identified, Emergency Drugs available, Suction available and Patient being monitored Patient Re-evaluated:Patient Re-evaluated prior to induction Oxygen Delivery Method: Circle system utilized Preoxygenation: Pre-oxygenation with 100% oxygen Induction Type: IV induction Ventilation: Mask ventilation without difficulty LMA: LMA inserted LMA Size: 4.0 Number of attempts: 1 Airway Equipment and Method: Bite block Placement Confirmation: positive ETCO2 Tube secured with: Tape Dental Injury: Teeth and Oropharynx as per pre-operative assessment

## 2020-02-10 NOTE — Anesthesia Procedure Notes (Signed)
Anesthesia Regional Block: Adductor canal block   Pre-Anesthetic Checklist: ,, timeout performed, Correct Patient, Correct Site, Correct Laterality, Correct Procedure, Correct Position, site marked, Risks and benefits discussed,  Surgical consent,  Pre-op evaluation,  At surgeon's request and post-op pain management  Laterality: Left  Prep: Maximum Sterile Barrier Precautions used, chloraprep       Needles:  Injection technique: Single-shot  Needle Type: Echogenic Stimulator Needle     Needle Length: 9cm  Needle Gauge: 21     Additional Needles:   Procedures:,,,, ultrasound used (permanent image in chart),,,,  Narrative:  Start time: 02/10/2020 9:21 AM End time: 02/10/2020 9:31 AM Injection made incrementally with aspirations every 5 mL.  Performed by: Personally  Anesthesiologist: Freddrick March, MD  Additional Notes: Monitors applied. No increased pain on injection. No increased resistance to injection. Injection made in 5cc increments. Good needle visualization. Patient tolerated procedure well.

## 2020-02-10 NOTE — Progress Notes (Signed)
Assisted Dr. Hulan Fray with left, ultrasound guided, adductor canal block. Side rails up, monitors on throughout procedure. See vital signs in flow sheet. Tolerated Procedure well.

## 2020-02-10 NOTE — Op Note (Signed)
DATE OF SURGERY:  02/10/2020  TIME: 12:13 PM  PATIENT NAME:  Colton Miranda    AGE: 79 y.o.   PRE-OPERATIVE DIAGNOSIS:  Left knee osteoarthris  POST-OPERATIVE DIAGNOSIS:  Left knee osteoarthris  PROCEDURE:  Procedure(s): TOTAL KNEE ARTHROPLASTY  SURGEON:  Era Parr,[place] ANDREW  ASSISTANT:  Leeanne Haus, PA-C, present and scrubbed throughout the case, critical for assistance with exposure, retraction, instrumentation, and closure.  OPERATIVE IMPLANTS: Depuy PFC Attune Rotating Platform.  Femur size 7, Tibia size 8, Patella size 38 3-peg oval button, with a 7 mm polyethylene insert.   PREOPERATIVE INDICATIONS:   MARKAI ORNDOFF is a 79 y.o. year old male with end stage bone on bone arthritis of the knee who failed conservative treatment and elected for Total Knee Arthroplasty.   The risks, benefits, and alternatives were discussed at length including but not limited to the risks of infection, bleeding, nerve injury, stiffness, blood clots, the need for revision surgery, cardiopulmonary complications, among others, and they were willing to proceed.  OPERATIVE DESCRIPTION:  The patient was brought to the operative room and placed in a supine position.  Spinal anesthesia was administered.  IV antibiotics were given.  The lower extremity was prepped and draped in the usual sterile fashion.  Time out was performed.  The leg was elevated and exsanguinated and the tourniquet was inflated.  Anterior quadriceps tendon splitting approach was performed.  The patella was retracted and osteophytes were removed.  The anterior horn of the medial and lateral meniscus was removed and cruciate ligaments resected.   The distal femur was opened with the drill and the intramedullary distal femoral cutting jig was utilized, set at 5 degrees resecting 10 mm off the distal femur.  Care was taken to protect the collateral ligaments.  The distal femoral sizing jig was applied, taking care to avoid  notching.  Then the 4-in-1 cutting jig was applied and the anterior and posterior femur was cut, along with the chamfer cuts.    Then the extramedullary tibial cutting jig was utilized making the appropriate cut using the anterior tibial crest as a reference building in appropriate posterior slope.  Care was taken during the cut to protect the medial and collateral ligaments.  The proximal tibia was removed along with the posterior horns of the menisci.   The posterior medial femoral osteophytes and posterior lateral femoral osteophytes were removed.    The flexion gap was then measured and was symmetric with the extension gap, measured at 7.  I completed the distal femoral preparation using the appropriate jig to prepare the box.  The patella was then measured, and cut with the saw.    The proximal tibia sized and prepared accordingly with the reamer and the punch, and then all components were trialed with the trial insert.  The knee was found to have excellent balance and full motion.    The above named components were then cemented into place and all excess cement was removed.  The trial polyethylene component was in place during cementation, and then was exchanged for the real polyethylene component.    The knee was easily taken through a range of motion and the patella tracked well and the knee irrigated copiously and the parapatellar and subcutaneous tissue closed with vicryl, and monocryl with steri strips for the skin.  The arthrotomy was closed at 90 of flexion. The wounds were dressed with sterile gauze and the tourniquet released and the patient was awakened and returned to the PACU in  stable and satisfactory condition.  There were no complications.  Total tourniquet time was 90 minutes.

## 2020-02-10 NOTE — Transfer of Care (Signed)
Immediate Anesthesia Transfer of Care Note  Patient: Colton Miranda  Procedure(s) Performed: TOTAL KNEE ARTHROPLASTY (Left Knee)  Patient Location: PACU  Anesthesia Type:GA combined with regional for post-op pain  Level of Consciousness: awake, alert  and oriented  Airway & Oxygen Therapy: Patient Spontanous Breathing and Patient connected to face mask oxygen  Post-op Assessment: Report given to RN and Post -op Vital signs reviewed and stable  Post vital signs: Reviewed and stable  Last Vitals:  Vitals Value Taken Time  BP 143/71 02/10/20 1231  Temp    Pulse 94 02/10/20 1239  Resp 16 02/10/20 1239  SpO2 93 % 02/10/20 1239  Vitals shown include unvalidated device data.  Last Pain:  Vitals:   02/10/20 0816  TempSrc: Oral         Complications: No apparent anesthesia complications

## 2020-02-10 NOTE — Anesthesia Preprocedure Evaluation (Addendum)
Anesthesia Evaluation  Patient identified by MRN, date of birth, ID band Patient awake    Reviewed: Allergy & Precautions, NPO status , Patient's Chart, lab work & pertinent test results  Airway Mallampati: II  TM Distance: >3 FB Neck ROM: Full    Dental  (+) Edentulous Upper   Pulmonary COPD, former smoker, PE   Pulmonary exam normal breath sounds clear to auscultation       Cardiovascular hypertension, + DVT  Normal cardiovascular exam+ dysrhythmias Atrial Fibrillation  Rhythm:Regular Rate:Normal  EKG RBBB HLD   Neuro/Psych CVA negative psych ROS   GI/Hepatic negative GI ROS, Neg liver ROS,   Endo/Other  negative endocrine ROSdiabetes, Type 2, Oral Hypoglycemic Agents  Renal/GU negative Renal ROS  negative genitourinary   Musculoskeletal  (+) Arthritis ,   Abdominal   Peds  Hematology Protein S deficiency, Protein C deficiency, on coumadin/lovenox. Last dose of coumadin 5/15. Last dose of lovenox 5/20 at 9pm   Anesthesia Other Findings   Reproductive/Obstetrics                            Anesthesia Physical Anesthesia Plan  ASA: III  Anesthesia Plan: Regional and General   Post-op Pain Management:  Regional for Post-op pain   Induction:   PONV Risk Score and Plan: 2 and Treatment may vary due to age or medical condition, Propofol infusion, Ondansetron and Dexamethasone  Airway Management Planned: LMA  Additional Equipment:   Intra-op Plan:   Post-operative Plan:   Informed Consent: I have reviewed the patients History and Physical, chart, labs and discussed the procedure including the risks, benefits and alternatives for the proposed anesthesia with the patient or authorized representative who has indicated his/her understanding and acceptance.     Dental advisory given  Plan Discussed with: CRNA  Anesthesia Plan Comments:        Anesthesia Quick Evaluation

## 2020-02-11 DIAGNOSIS — E785 Hyperlipidemia, unspecified: Secondary | ICD-10-CM | POA: Diagnosis present

## 2020-02-11 DIAGNOSIS — R262 Difficulty in walking, not elsewhere classified: Secondary | ICD-10-CM | POA: Diagnosis not present

## 2020-02-11 DIAGNOSIS — D62 Acute posthemorrhagic anemia: Secondary | ICD-10-CM | POA: Diagnosis present

## 2020-02-11 DIAGNOSIS — R55 Syncope and collapse: Secondary | ICD-10-CM | POA: Diagnosis present

## 2020-02-11 DIAGNOSIS — Z20822 Contact with and (suspected) exposure to covid-19: Secondary | ICD-10-CM | POA: Diagnosis present

## 2020-02-11 DIAGNOSIS — Z7401 Bed confinement status: Secondary | ICD-10-CM | POA: Diagnosis not present

## 2020-02-11 DIAGNOSIS — D649 Anemia, unspecified: Secondary | ICD-10-CM | POA: Diagnosis not present

## 2020-02-11 DIAGNOSIS — R2681 Unsteadiness on feet: Secondary | ICD-10-CM | POA: Diagnosis not present

## 2020-02-11 DIAGNOSIS — Z8673 Personal history of transient ischemic attack (TIA), and cerebral infarction without residual deficits: Secondary | ICD-10-CM | POA: Diagnosis not present

## 2020-02-11 DIAGNOSIS — Z4789 Encounter for other orthopedic aftercare: Secondary | ICD-10-CM | POA: Diagnosis not present

## 2020-02-11 DIAGNOSIS — H9190 Unspecified hearing loss, unspecified ear: Secondary | ICD-10-CM | POA: Diagnosis present

## 2020-02-11 DIAGNOSIS — R52 Pain, unspecified: Secondary | ICD-10-CM | POA: Diagnosis not present

## 2020-02-11 DIAGNOSIS — J449 Chronic obstructive pulmonary disease, unspecified: Secondary | ICD-10-CM | POA: Diagnosis present

## 2020-02-11 DIAGNOSIS — M2550 Pain in unspecified joint: Secondary | ICD-10-CM | POA: Diagnosis not present

## 2020-02-11 DIAGNOSIS — E161 Other hypoglycemia: Secondary | ICD-10-CM | POA: Diagnosis not present

## 2020-02-11 DIAGNOSIS — I4510 Unspecified right bundle-branch block: Secondary | ICD-10-CM | POA: Diagnosis present

## 2020-02-11 DIAGNOSIS — M6281 Muscle weakness (generalized): Secondary | ICD-10-CM | POA: Diagnosis not present

## 2020-02-11 DIAGNOSIS — R42 Dizziness and giddiness: Secondary | ICD-10-CM | POA: Diagnosis not present

## 2020-02-11 DIAGNOSIS — E162 Hypoglycemia, unspecified: Secondary | ICD-10-CM | POA: Diagnosis not present

## 2020-02-11 DIAGNOSIS — D6859 Other primary thrombophilia: Secondary | ICD-10-CM | POA: Diagnosis present

## 2020-02-11 DIAGNOSIS — Z85828 Personal history of other malignant neoplasm of skin: Secondary | ICD-10-CM | POA: Diagnosis not present

## 2020-02-11 DIAGNOSIS — Z96641 Presence of right artificial hip joint: Secondary | ICD-10-CM | POA: Diagnosis present

## 2020-02-11 DIAGNOSIS — N401 Enlarged prostate with lower urinary tract symptoms: Secondary | ICD-10-CM | POA: Diagnosis present

## 2020-02-11 DIAGNOSIS — E1141 Type 2 diabetes mellitus with diabetic mononeuropathy: Secondary | ICD-10-CM | POA: Diagnosis present

## 2020-02-11 DIAGNOSIS — Z86718 Personal history of other venous thrombosis and embolism: Secondary | ICD-10-CM | POA: Diagnosis not present

## 2020-02-11 DIAGNOSIS — R351 Nocturia: Secondary | ICD-10-CM | POA: Diagnosis present

## 2020-02-11 DIAGNOSIS — R293 Abnormal posture: Secondary | ICD-10-CM | POA: Diagnosis not present

## 2020-02-11 DIAGNOSIS — E1365 Other specified diabetes mellitus with hyperglycemia: Secondary | ICD-10-CM | POA: Diagnosis not present

## 2020-02-11 DIAGNOSIS — Z86711 Personal history of pulmonary embolism: Secondary | ICD-10-CM | POA: Diagnosis not present

## 2020-02-11 DIAGNOSIS — Z9181 History of falling: Secondary | ICD-10-CM | POA: Diagnosis not present

## 2020-02-11 DIAGNOSIS — R41841 Cognitive communication deficit: Secondary | ICD-10-CM | POA: Diagnosis not present

## 2020-02-11 DIAGNOSIS — I480 Paroxysmal atrial fibrillation: Secondary | ICD-10-CM | POA: Diagnosis present

## 2020-02-11 DIAGNOSIS — Z6841 Body Mass Index (BMI) 40.0 and over, adult: Secondary | ICD-10-CM | POA: Diagnosis not present

## 2020-02-11 DIAGNOSIS — Z471 Aftercare following joint replacement surgery: Secondary | ICD-10-CM | POA: Diagnosis not present

## 2020-02-11 DIAGNOSIS — E1351 Other specified diabetes mellitus with diabetic peripheral angiopathy without gangrene: Secondary | ICD-10-CM | POA: Diagnosis not present

## 2020-02-11 DIAGNOSIS — Z8546 Personal history of malignant neoplasm of prostate: Secondary | ICD-10-CM | POA: Diagnosis not present

## 2020-02-11 DIAGNOSIS — E871 Hypo-osmolality and hyponatremia: Secondary | ICD-10-CM | POA: Diagnosis present

## 2020-02-11 DIAGNOSIS — I469 Cardiac arrest, cause unspecified: Secondary | ICD-10-CM | POA: Diagnosis not present

## 2020-02-11 DIAGNOSIS — I10 Essential (primary) hypertension: Secondary | ICD-10-CM | POA: Diagnosis present

## 2020-02-11 DIAGNOSIS — Z7901 Long term (current) use of anticoagulants: Secondary | ICD-10-CM | POA: Diagnosis not present

## 2020-02-11 DIAGNOSIS — Z96652 Presence of left artificial knee joint: Secondary | ICD-10-CM | POA: Diagnosis present

## 2020-02-11 DIAGNOSIS — I959 Hypotension, unspecified: Secondary | ICD-10-CM | POA: Diagnosis present

## 2020-02-11 DIAGNOSIS — E861 Hypovolemia: Secondary | ICD-10-CM | POA: Diagnosis present

## 2020-02-11 LAB — PROTIME-INR
INR: 1.2 (ref 0.8–1.2)
Prothrombin Time: 14.3 seconds (ref 11.4–15.2)

## 2020-02-11 NOTE — Progress Notes (Signed)
Pt yellow MEWS after VS taken while pt asleep. Pt in no distress, resting comfortably with no complaints. Will resume IV fluids and push PO fluids.

## 2020-02-11 NOTE — Progress Notes (Signed)
Physical Therapy Treatment Patient Details Name: Colton Miranda MRN: [id number] DOB: 1941/04/02 Today's Date: 02/11/2020    History of Present Illness Patient is 79 y.o. male s/p Lt TKA on 02/10/20 with PMH significant for DM, Stroke, protate cancer, PAF, OA, HTN, HLD, HOH, hx of DVT and PE, COPD, Rt THA In 2010.    PT Comments    Pt is progressing well with mobility. Pt ambulated in the hall 100 feet min guard assist. Pt educated on HEP and completed with min assist. Pt's plan is to d/c home with wife assisting. Pt will benefit from continued acute skilled PT to maximize mobility and Independence for return home from family.   Follow Up Recommendations  Follow surgeon's recommendation for DC plan and follow-up therapies;Home health PT     Equipment Recommendations  None recommended by PT    Recommendations for Other Services       Precautions / Restrictions Precautions Precautions: Knee Restrictions Weight Bearing Restrictions: No    Mobility  Bed Mobility Overal bed mobility: Needs Assistance Bed Mobility: Supine to Sit     Supine to sit: Min assist;HOB elevated     General bed mobility comments: cues for techniquem assistance with trunk support to rise up  Transfers Overall transfer level: Needs assistance Equipment used: Rolling walker (2 wheeled) Transfers: Sit to/from Stand Sit to Stand: Min guard         General transfer comment: cues for hand placement and assist to complete power up and steady with rising.  Ambulation/Gait Ambulation/Gait assistance: Min guard Gait Distance (Feet): 100 Feet Assistive device: Rolling walker (2 wheeled) Gait Pattern/deviations: Step-through pattern;Decreased stride length Gait velocity: decreased       Stairs             Wheelchair Mobility    Modified Rankin (Stroke Patients Only)       Balance Overall balance assessment: Needs assistance Sitting-balance support: Bilateral upper extremity  supported Sitting balance-Leahy Scale: Normal     Standing balance support: Bilateral upper extremity supported;During functional activity Standing balance-Leahy Scale: Fair                              Cognition Arousal/Alertness: Awake/alert Behavior During Therapy: WFL for tasks assessed/performed Overall Cognitive Status: Within Functional Limits for tasks assessed                                        Exercises Total Joint Exercises Ankle Circles/Pumps: AROM;Strengthening;Both;10 reps;Supine Quad Sets: AROM;Strengthening;Left;10 reps;Supine Heel Slides: AAROM;Strengthening;Left;10 reps;Supine Hip ABduction/ADduction: AAROM;Strengthening;Left;10 reps;Supine Straight Leg Raises: AAROM;Strengthening;Left;10 reps;Supine Long Arc Quad: AROM;Strengthening;Left;10 reps;Seated Knee Flexion: AAROM;Strengthening;Left;10 reps;Seated Goniometric ROM: -5-70* seated    General Comments        Pertinent Vitals/Pain Pain Score: 5  Pain Location: Lt knee Pain Descriptors / Indicators: Discomfort;Guarding Pain Intervention(s): Limited activity within patient's tolerance;Repositioned;Monitored during session;Ice applied    Home Living                      Prior Function            PT Goals (current goals can now be found in the care plan section) Progress towards PT goals: Progressing toward goals    Frequency    7X/week      PT Plan Current plan remains appropriate    Co-evaluation  AM-PAC PT "6 Clicks" Mobility   Outcome Measure  Help needed turning from your back to your side while in a flat bed without using bedrails?: A Little Help needed moving from lying on your back to sitting on the side of a flat bed without using bedrails?: A Little Help needed moving to and from a bed to a chair (including a wheelchair)?: A Little Help needed standing up from a chair using your arms (e.g., wheelchair or bedside  chair)?: A Little Help needed to walk in hospital room?: A Little Help needed climbing 3-5 steps with a railing? : A Little 6 Click Score: 18    End of Session Equipment Utilized During Treatment: Gait belt Activity Tolerance: Patient tolerated treatment well Patient left: in chair;with call bell/phone within reach;with chair alarm set;with family/visitor present Nurse Communication: Mobility status PT Visit Diagnosis: Muscle weakness (generalized) (M62.81);Difficulty in walking, not elsewhere classified (R26.2)     Time: [id number] PT Time Calculation (min) (ACUTE ONLY): 31 min  Charges:  $Gait Training: 8-22 mins $Therapeutic Exercise: 8-22 mins                       Lelon Mast 02/11/2020, 10:52 AM

## 2020-02-11 NOTE — Progress Notes (Signed)
Physical Therapy Treatment Patient Details Name: Colton Miranda MRN: [id number] DOB: Nov 13, 1940 Today's Date: 02/11/2020    History of Present Illness Patient is 79 y.o. male s/p Lt TKA on 02/10/20 with PMH significant for DM, Stroke, protate cancer, PAF, OA, HTN, HLD, HOH, hx of DVT and PE, COPD, Rt THA In 2010.    PT Comments    Pt is progressing with mobility. Pt's family was present an observed therapy session. Pt completed HEP with min verbal cues and assistance. Pt was able to go up/down 1 step with min assist. Pt reported some "wooziness". Pt returned supine in bed. Pt stated he did not get much sleep last night. Recommend another night as an inpatient and reassess tomorrow with PT. Pt will continue to benefit from skilled PT to maximize mobility and Independence for return home with family assisting.  Follow Up Recommendations  Follow surgeon's recommendation for DC plan and follow-up therapies;Home health PT     Equipment Recommendations  None recommended by PT    Recommendations for Other Services       Precautions / Restrictions Precautions Precautions: Knee;Fall Restrictions Weight Bearing Restrictions: No    Mobility  Bed Mobility Overal bed mobility: Needs Assistance Bed Mobility: Sit to Supine     Supine to sit: Min assist;HOB elevated Sit to supine: Min assist;HOB elevated   General bed mobility comments: cues for techniquem assistance with trunk support to rise up  Transfers Overall transfer level: Needs assistance Equipment used: Rolling walker (2 wheeled) Transfers: Sit to/from Stand Sit to Stand: Min assist         General transfer comment: cues for hand placement and assist to complete power up and steady with rising.  Ambulation/Gait Ambulation/Gait assistance: Min guard Gait Distance (Feet): 10 Feet Assistive device: Rolling walker (2 wheeled) Gait Pattern/deviations: Step-through pattern;Decreased stride length Gait velocity:  decreased   General Gait Details: completed step training prior to walking distance. Pt c/o some "wooziness" with steps therefore returned pt back to bed to get some rest. Pt reported he did not sleep much last night.   Stairs Stairs: Yes Stairs assistance: Min assist Stair Management: No rails;Backwards Number of Stairs: 1     Wheelchair Mobility    Modified Rankin (Stroke Patients Only)       Balance Overall balance assessment: Needs assistance Sitting-balance support: Bilateral upper extremity supported Sitting balance-Leahy Scale: Normal     Standing balance support: Bilateral upper extremity supported;During functional activity Standing balance-Leahy Scale: Fair                              Cognition Arousal/Alertness: Awake/alert Behavior During Therapy: WFL for tasks assessed/performed Overall Cognitive Status: Within Functional Limits for tasks assessed                                        Exercises Total Joint Exercises Ankle Circles/Pumps: AROM;Strengthening;Both;10 reps;Supine Quad Sets: AROM;Strengthening;Left;10 reps;Supine Heel Slides: AAROM;Strengthening;Left;10 reps;Supine Hip ABduction/ADduction: AAROM;Strengthening;Left;10 reps;Supine Straight Leg Raises: AAROM;Strengthening;Left;10 reps;Supine Long Arc Quad: AROM;Strengthening;Left;10 reps;Seated Knee Flexion: AAROM;Strengthening;Left;10 reps;Seated Goniometric ROM: -5-70* seated    General Comments        Pertinent Vitals/Pain Pain Score: 4  Pain Location: Lt knee Pain Descriptors / Indicators: Discomfort;Guarding Pain Intervention(s): Limited activity within patient's tolerance;Repositioned;Ice applied;Monitored during session    Home Living  Prior Function            PT Goals (current goals can now be found in the care plan section) Progress towards PT goals: Progressing toward goals    Frequency    7X/week       PT Plan Current plan remains appropriate    Co-evaluation              AM-PAC PT "6 Clicks" Mobility   Outcome Measure  Help needed turning from your back to your side while in a flat bed without using bedrails?: A Little Help needed moving from lying on your back to sitting on the side of a flat bed without using bedrails?: A Little Help needed moving to and from a bed to a chair (including a wheelchair)?: A Little Help needed standing up from a chair using your arms (e.g., wheelchair or bedside chair)?: A Little Help needed to walk in hospital room?: A Little Help needed climbing 3-5 steps with a railing? : A Little 6 Click Score: 18    End of Session Equipment Utilized During Treatment: Gait belt Activity Tolerance: Patient limited by fatigue Patient left: with call bell/phone within reach;with family/visitor present;in bed;with bed alarm set Nurse Communication: Mobility status PT Visit Diagnosis: Muscle weakness (generalized) (M62.81);Difficulty in walking, not elsewhere classified (R26.2)     Time: [id number] PT Time Calculation (min) (ACUTE ONLY): 29 min  Charges:  $Gait Training: 8-22 mins $Therapeutic Exercise: 8-22 mins                      Lelon Mast 02/11/2020, 2:02 PM

## 2020-02-11 NOTE — TOC Progression Note (Signed)
Transition of Care [facility]) - Progression Note    Patient Details  Name: Colton Miranda MRN: [id number] Date of Birth: 11/12/1940  [facility]) CM/SW Contact  Joaquin Courts, RN Phone Number: 02/11/2020, 1:53 PM  Clinical Narrative:    CM received call the [facility] will be unable to service patient due to staffing.  [facility] will provide HHPT services instead.  Patient agreement to any in-network agency.    Expected Discharge Plan: [place] Barriers to Discharge: No Barriers Identified  Expected Discharge Plan and Services Expected Discharge Plan: [place]   Discharge Planning Services: CM Consult Post Acute Care Choice: [place] arrangements for the past 2 months: Apartment Expected Discharge Date: 02/11/20               DME Arranged: N/A DME Agency: NA       HH Arranged: PT HH Agency: [place] Date [place]: 02/11/20 Time [place]: [id number] Representative spoke with at [place]: [place] ([place]) Interventions    Readmission Risk Interventions No flowsheet data found.

## 2020-02-11 NOTE — TOC Initial Note (Signed)
Transition of Care [facility]) - Initial/Assessment Note    Patient Details  Name: Colton Miranda MRN: [id number] Date of Birth: 1941/08/11  Transition of Care [facility]) CM/SW Contact:    Joaquin Courts, RN Phone Number: 02/11/2020, 12:16 PM  Clinical Narrative:   CM spoke with patient at bedside.  [facility] to provide [facility] health physical therapy. Patient reports he has rolling walker and declines 3in1.                 Expected Discharge Plan: [place] Barriers to Discharge: No Barriers Identified   Patient Goals and CMS Choice Patient states their goals for this hospitalization and ongoing recovery are:: to go home with therapy CMS Medicare.gov Compare Post Acute Care list provided to:: Patient Choice offered to / list presented to : Patient  Expected Discharge Plan and Services Expected Discharge Plan: [place]   Discharge Planning Services: CM Consult Post Acute Care Choice: [place] arrangements for the past 2 months: Apartment Expected Discharge Date: 02/11/20               DME Arranged: N/A DME Agency: NA       HH Arranged: PT [place] Agency: [facility] [employer] Date [place]: 02/11/20 Time [place]: 1215 Representative spoke with at [place]: [place] Arrangements/Services Living arrangements for the past 2 months: Apartment   Patient language and need for interpreter reviewed:: Yes Do you feel safe going back to the place where you live?: Yes      Need for Family Participation in Patient Care: Yes (Comment) Care giver support system in place?: Yes (comment)   Criminal Activity/Legal Involvement Pertinent to Current Situation/Hospitalization: No - Comment as needed  Activities of Daily Living Home Assistive Devices/Equipment: Eyeglasses, CBG Meter, Blood pressure cuff, Cane (specify quad or straight), Walker (specify type) ADL Screening (condition at time  of admission) Patient's cognitive ability adequate to safely complete daily activities?: Yes Is the patient deaf or have difficulty hearing?: Yes Does the patient have difficulty seeing, even when wearing glasses/contacts?: No Does the patient have difficulty concentrating, remembering, or making decisions?: No Patient able to express need for assistance with ADLs?: Yes Does the patient have difficulty dressing or bathing?: No Independently performs ADLs?: Yes (appropriate for developmental age) Does the patient have difficulty walking or climbing stairs?: Yes Weakness of Legs: None Weakness of Arms/Hands: None  Permission Sought/Granted                  Emotional Assessment Appearance:: Appears stated age Attitude/Demeanor/Rapport: Engaged Affect (typically observed): Accepting Orientation: : Oriented to Self, Oriented to Place, Oriented to  Time, Oriented to Situation   Psych Involvement: No (comment)  Admission diagnosis:  Osteoarthritis of left knee [M17.12] Patient Active Problem List   Diagnosis Date Noted  . Osteoarthritis of left knee 02/10/2020  . Contact dermatitis 01/19/2020  . PAF (paroxysmal atrial fibrillation) ([place]) 04/14/2018  . Malignant neoplasm of prostate ([place]) 10/07/2017  . Colon polyps 11/26/2016  . Influenza vaccination declined 05/28/2016  . History of fall 07/25/2015  . Palpitations 11/08/2014  . Pain of left arm 11/08/2014  . Pain in joint, lower leg 02/28/2014  . Essential hypertension 02/28/2014  . Disturbance of skin sensation 02/23/2013  . Insomnia, unspecified 02/23/2013  . Obesity   . DM (diabetes mellitus), secondary, uncontrolled, with peripheral vascular complications ([place])   . Hyperlipemia   . Urinary frequency   .  Primary hypercoagulable state ([place]) 01/03/2013  . Other pulmonary embolism and infarction 01/03/2013  . Long term current use of anticoagulant therapy 01/03/2013   PCP:  Marton Redwood, MD Pharmacy:   [address] [place] [address] Phone: [phone] Fax: [phone]     Social Determinants of Health (SDOH) Interventions    Readmission Risk Interventions No flowsheet data found.

## 2020-02-11 NOTE — Progress Notes (Signed)
    Subjective:  Patient reports pain as moderate to severe.  No overnight events.  No nausea vomiting, chest pain or shortness of breath.  Objective:   VITALS:   Vitals:   02/10/20 1732 02/10/20 2250 02/11/20 0204 02/11/20 0620  BP: (!) 102/56 129/60 (!) 109/53 136/60  Pulse: 82 (!) 52 (!) 49 (!) 49  Resp: 17 16 18 17   Temp: 98.3 F (36.8 C) (!) 97.5 F (36.4 C) 97.8 F (36.6 C) 97.7 F (36.5 C)  TempSrc:  Oral Oral Oral  SpO2: 98% 97% 100% 100%  Weight:      Height:        Neurologically intact Neurovascular intact Sensation intact distally Intact pulses distally Dorsiflexion/Plantar flexion intact Incision: dressing C/D/I Hemovac drain removed  Lab Results  Component Value Date   WBC 5.8 02/01/2020   HGB 11.6 (L) 02/01/2020   HCT 36.4 (L) 02/01/2020   MCV 87.7 02/01/2020   PLT 245 02/01/2020   BMET    Component Value Date/Time   NA 137 02/01/2020 1444   NA 137 01/23/2016 1255   K 5.4 (H) 02/01/2020 1444   CL 103 02/01/2020 1444   CO2 26 02/01/2020 1444   GLUCOSE 130 (H) 02/01/2020 1444   BUN 20 02/01/2020 1444   BUN 21 01/23/2016 1255   CREATININE 0.95 02/01/2020 1444   CREATININE 1.16 06/11/2017 0900   CALCIUM 9.2 02/01/2020 1444   GFRNONAA >60 02/01/2020 1444   GFRNONAA 61 06/11/2017 0900   GFRAA >60 02/01/2020 1444   GFRAA 71 06/11/2017 0900     Assessment/Plan: 1 Day Post-Op   Active Problems:   Osteoarthritis of left knee   Advance diet -Weightbearing as tolerated with therapy.  -Lovenox and squeezers while in-house for DVT prophylaxis.  -We will follow up progress with physical therapy and determination of discharge which will be to his home.  -We will tentatively plan for discharge home later today with home health therapy.   Nicholes Stairs 02/11/2020, 9:35 AM   Geralynn Rile, MD [phone]

## 2020-02-12 LAB — PROTIME-INR
INR: 1.1 (ref 0.8–1.2)
Prothrombin Time: 13.9 seconds (ref 11.4–15.2)

## 2020-02-12 MED ORDER — ALFUZOSIN HCL ER 10 MG PO TB24
10.0000 mg | ORAL_TABLET | Freq: Every day | ORAL | Status: DC
  Administered 2020-02-12 – 2020-02-13 (×2): 10 mg via ORAL
  Filled 2020-02-12 (×2): qty 1

## 2020-02-12 MED ORDER — ALFUZOSIN HCL ER 10 MG PO TB24: 10.0000 mg | ORAL_TABLET | Freq: Every day | ORAL | Status: DC

## 2020-02-12 NOTE — Progress Notes (Signed)
Subjective: 2 Days Post-Op Procedure(s) (LRB): TOTAL KNEE ARTHROPLASTY (Left) Patient reports pain as mild.   Patient reports no events overnight Denies N/T, N/V, CP or SOB Doing well, no complaints this am  Objective: Vital signs in last 24 hours: Temp:  [97.9 F (36.6 C)-98.3 F (36.8 C)] 98.3 F (36.8 C) (05/23 0540) Pulse Rate:  [44-67] 67 (05/23 0540) Resp:  [16-18] 18 (05/23 0540) BP: (87-121)/(42-59) 121/49 (05/23 0540) SpO2:  [94 %-98 %] 95 % (05/23 0540)  Intake/Output from previous day: 05/22 0701 - 05/23 0700 In: 2130.5 [P.O.:900; I.V.:1230.5] Out: 775 [Urine:775] Intake/Output this shift: Total I/O In: -  Out: 300 [Urine:300]  No results for input(s): HGB in the last 72 hours. No results for input(s): WBC, RBC, HCT, PLT in the last 72 hours. No results for input(s): NA, K, CL, CO2, BUN, CREATININE, GLUCOSE, CALCIUM in the last 72 hours. Recent Labs    02/11/20 0316 02/12/20 0259  INR 1.2 1.1    Neurologically intact Neurovascular intact Sensation intact distally Intact pulses distally Dorsiflexion/Plantar flexion intact Incision: dressing C/D/I No cellulitis present Compartment soft   Assessment/Plan: 2 Days Post-Op Procedure(s) (LRB): TOTAL KNEE ARTHROPLASTY (Left) Up with therapy Discharge home with home health If patient is able to get up this AM with therapy then he is able to go home today. Will follow up in the office in 2 weeks Meds have already been sent to pharmacy When D/C will stop lovenox and continue on Warfarin   Patient's anticipated LOS is less than 2 midnights, meeting these requirements: - Younger than 65 - Lives within 1 hour of care - Has a competent adult at home to recover with post-op recover - NO history of  - Chronic pain requiring opiods  - Diabetes  - Coronary Artery Disease  - Heart failure  - Heart attack  - Stroke  - DVT/VTE  - Cardiac arrhythmia  - Respiratory Failure/COPD  - Renal failure  -  Anemia  - Advanced Liver disease       Kunaal Walkins R Lunden Stieber. PA-C [facility] 02/12/2020, 8:53 AM

## 2020-02-12 NOTE — Progress Notes (Signed)
Physical Therapy Treatment Patient Details Name: Colton Miranda MRN: [id number] DOB: January 13, 1941 Today's Date: 02/12/2020    History of Present Illness Patient is 79 y.o. male s/p Lt TKA on 02/10/20 with PMH significant for DM, Stroke, protate cancer, PAF, OA, HTN, HLD, HOH, hx of DVT and PE, COPD, Rt THA In 2010.    PT Comments    Pt progressing slowly with mobility and limited this pm by fatigue but with noted improved L LE weight bearing tolerance vs this am.  Pt also up to standing to void and with noted difficulty. - RN aware.   Follow Up Recommendations  Follow surgeon's recommendation for DC plan and follow-up therapies;Home health PT     Equipment Recommendations  None recommended by PT    Recommendations for Other Services       Precautions / Restrictions Precautions Precautions: Knee;Fall Restrictions Weight Bearing Restrictions: No Other Position/Activity Restrictions: WBAT    Mobility  Bed Mobility Overal bed mobility: Needs Assistance Bed Mobility: Sit to Supine     Supine to sit: Min assist;HOB elevated Sit to supine: Min assist;Mod assist   General bed mobility comments: cues for sequence and use of R LE to self assist  Transfers Overall transfer level: Needs assistance Equipment used: Rolling walker (2 wheeled) Transfers: Sit to/from Stand Sit to Stand: Min assist         General transfer comment: cues for hand placement and assist to complete power up and steady with rising.  Ambulation/Gait Ambulation/Gait assistance: Min assist Gait Distance (Feet): 70 Feet Assistive device: Rolling walker (2 wheeled) Gait Pattern/deviations: Step-through pattern;Decreased stride length Gait velocity: decreased   General Gait Details: increased time with cues for sequence, posture and position from [employer]             Wheelchair Mobility    Modified Rankin (Stroke Patients Only)       Balance Overall balance assessment: Needs  assistance Sitting-balance support: Bilateral upper extremity supported Sitting balance-Leahy Scale: Good     Standing balance support: Bilateral upper extremity supported Standing balance-Leahy Scale: Poor                              Cognition Arousal/Alertness: Awake/alert Behavior During Therapy: WFL for tasks assessed/performed Overall Cognitive Status: Within Functional Limits for tasks assessed                                        Exercises Total Joint Exercises Ankle Circles/Pumps: AROM;Strengthening;Both;Supine;15 reps Quad Sets: AROM;Strengthening;Left;10 reps;Supine Heel Slides: AAROM;Left;Supine;15 reps Hip ABduction/ADduction: AAROM;Strengthening;Left;10 reps;Supine Straight Leg Raises: AAROM;Strengthening;Left;10 reps;Supine    General Comments        Pertinent Vitals/Pain Pain Assessment: 0-10 Pain Score: 4  Pain Location: Lt knee Pain Descriptors / Indicators: Aching;Sore Pain Intervention(s): Limited activity within patient's tolerance;Monitored during session;Premedicated before session;Ice applied    Home Living                      Prior Function            PT Goals (current goals can now be found in the care plan section) Acute Rehab PT Goals Patient Stated Goal: get back more independence PT Goal Formulation: With patient/family Time For Goal Achievement: 02/17/20 Potential to Achieve Goals: Good Progress towards PT goals: Progressing toward goals  Frequency    7X/week      PT Plan Current plan remains appropriate    Co-evaluation              AM-PAC PT "6 Clicks" Mobility   Outcome Measure  Help needed turning from your back to your side while in a flat bed without using bedrails?: A Little Help needed moving from lying on your back to sitting on the side of a flat bed without using bedrails?: A Little Help needed moving to and from a bed to a chair (including a wheelchair)?: A  Little Help needed standing up from a chair using your arms (e.g., wheelchair or bedside chair)?: A Little Help needed to walk in hospital room?: A Little Help needed climbing 3-5 steps with a railing? : A Lot 6 Click Score: 17    End of Session Equipment Utilized During Treatment: Gait belt Activity Tolerance: Patient limited by fatigue;Patient limited by pain Patient left: in bed;with call bell/phone within reach;with family/visitor present Nurse Communication: Mobility status PT Visit Diagnosis: Muscle weakness (generalized) (M62.81);Difficulty in walking, not elsewhere classified (R26.2)     Time: 1436-1500 PT Time Calculation (min) (ACUTE ONLY): 24 min  Charges:  $Gait Training: 8-22 mins $Therapeutic Exercise: 8-22 mins $Therapeutic Activity: 8-22 mins                     [place] Pager [phone] Office [phone]    Luanne Krzyzanowski 02/12/2020, 4:18 PM

## 2020-02-12 NOTE — Progress Notes (Signed)
Physical Therapy Treatment Patient Details Name: Colton Miranda MRN: [id number] DOB: 1940/12/22 Today's Date: 02/12/2020    History of Present Illness Patient is 79 y.o. male s/p Lt TKA on 02/10/20 with PMH significant for DM, Stroke, protate cancer, PAF, OA, HTN, HLD, HOH, hx of DVT and PE, COPD, Rt THA In 2010.    PT Comments    Pt very cooperative but requiring increased time for all activities.  Progress limited by increased pain/instability at L knee and fatigue.  Follow Up Recommendations  Follow surgeon's recommendation for DC plan and follow-up therapies;Home health PT     Equipment Recommendations  None recommended by PT    Recommendations for Other Services       Precautions / Restrictions Precautions Precautions: Knee;Fall Restrictions Weight Bearing Restrictions: No Other Position/Activity Restrictions: WBAT    Mobility  Bed Mobility Overal bed mobility: Needs Assistance Bed Mobility: Supine to Sit     Supine to sit: Min assist;HOB elevated     General bed mobility comments: cues for sequence and use of R LE to self assist  Transfers Overall transfer level: Needs assistance Equipment used: Rolling walker (2 wheeled) Transfers: Sit to/from Stand Sit to Stand: Min assist;Mod assist         General transfer comment: cues for hand placement and assist to complete power up and steady with rising.  Ambulation/Gait Ambulation/Gait assistance: Min assist Gait Distance (Feet): 39 Feet Assistive device: Rolling walker (2 wheeled) Gait Pattern/deviations: Step-through pattern;Decreased stride length Gait velocity: decreased   General Gait Details: cues for sequence, posture, position from RW; pt with noted increased instability at L knee; distance ltd by fatigue   Stairs             Wheelchair Mobility    Modified Rankin (Stroke Patients Only)       Balance Overall balance assessment: Needs assistance Sitting-balance support: Bilateral  upper extremity supported Sitting balance-Leahy Scale: Good     Standing balance support: Bilateral upper extremity supported Standing balance-Leahy Scale: Poor                              Cognition Arousal/Alertness: Awake/alert Behavior During Therapy: WFL for tasks assessed/performed Overall Cognitive Status: Within Functional Limits for tasks assessed                                        Exercises Total Joint Exercises Ankle Circles/Pumps: AROM;Strengthening;Both;Supine;15 reps Quad Sets: AROM;Strengthening;Left;10 reps;Supine Heel Slides: AAROM;Left;Supine;15 reps Hip ABduction/ADduction: AAROM;Strengthening;Left;10 reps;Supine Straight Leg Raises: AAROM;Strengthening;Left;10 reps;Supine    General Comments        Pertinent Vitals/Pain Pain Assessment: 0-10 Pain Score: 6  Pain Location: Lt knee Pain Descriptors / Indicators: Aching;Guarding;Grimacing;Sore Pain Intervention(s): Limited activity within patient's tolerance;Monitored during session;Premedicated before session;Ice applied    Home Living                      Prior Function            PT Goals (current goals can now be found in the care plan section) Acute Rehab PT Goals Patient Stated Goal: get back more independence PT Goal Formulation: With patient/family Time For Goal Achievement: 02/17/20 Potential to Achieve Goals: Good Progress towards PT goals: Not progressing toward goals - comment(increased pain/fatigue)    Frequency    7X/week  PT Plan Current plan remains appropriate    Co-evaluation              AM-PAC PT "6 Clicks" Mobility   Outcome Measure  Help needed turning from your back to your side while in a flat bed without using bedrails?: A Little Help needed moving from lying on your back to sitting on the side of a flat bed without using bedrails?: A Little Help needed moving to and from a bed to a chair (including a  wheelchair)?: A Little Help needed standing up from a chair using your arms (e.g., wheelchair or bedside chair)?: A Little Help needed to walk in hospital room?: A Little Help needed climbing 3-5 steps with a railing? : A Lot 6 Click Score: 17    End of Session Equipment Utilized During Treatment: Gait belt Activity Tolerance: Patient limited by fatigue;Patient limited by pain Patient left: in chair;with call bell/phone within reach;with chair alarm set Nurse Communication: Mobility status PT Visit Diagnosis: Muscle weakness (generalized) (M62.81);Difficulty in walking, not elsewhere classified (R26.2)     Time: 1005-1040 PT Time Calculation (min) (ACUTE ONLY): 35 min  Charges:  $Gait Training: 8-22 mins $Therapeutic Exercise: 8-22 mins                     [place] Pager [phone] Office [phone]    Anuja Manka 02/12/2020, 4:09 PM

## 2020-02-13 ENCOUNTER — Inpatient Hospital Stay (HOSPITAL_COMMUNITY)
Admission: EM | Admit: 2020-02-13 | Discharge: 2020-02-17 | DRG: 812 | Disposition: A | Discharge status: Skilled Nursing Facility | Payer: PPO | Attending: Internal Medicine | Admitting: Internal Medicine

## 2020-02-13 ENCOUNTER — Emergency Department (HOSPITAL_COMMUNITY): Payer: PPO

## 2020-02-13 ENCOUNTER — Other Ambulatory Visit: Payer: Self-pay

## 2020-02-13 ENCOUNTER — Encounter: Payer: Self-pay | Admitting: *Deleted

## 2020-02-13 DIAGNOSIS — Z86718 Personal history of other venous thrombosis and embolism: Secondary | ICD-10-CM

## 2020-02-13 DIAGNOSIS — Z6841 Body Mass Index (BMI) 40.0 and over, adult: Secondary | ICD-10-CM

## 2020-02-13 DIAGNOSIS — E861 Hypovolemia: Secondary | ICD-10-CM | POA: Diagnosis present

## 2020-02-13 DIAGNOSIS — E871 Hypo-osmolality and hyponatremia: Secondary | ICD-10-CM

## 2020-02-13 DIAGNOSIS — J449 Chronic obstructive pulmonary disease, unspecified: Secondary | ICD-10-CM | POA: Diagnosis present

## 2020-02-13 DIAGNOSIS — Z85828 Personal history of other malignant neoplasm of skin: Secondary | ICD-10-CM

## 2020-02-13 DIAGNOSIS — R351 Nocturia: Secondary | ICD-10-CM | POA: Diagnosis present

## 2020-02-13 DIAGNOSIS — Z96652 Presence of left artificial knee joint: Secondary | ICD-10-CM

## 2020-02-13 DIAGNOSIS — I480 Paroxysmal atrial fibrillation: Secondary | ICD-10-CM | POA: Diagnosis present

## 2020-02-13 DIAGNOSIS — Z87891 Personal history of nicotine dependence: Secondary | ICD-10-CM

## 2020-02-13 DIAGNOSIS — Z820 Family history of epilepsy and other diseases of the nervous system: Secondary | ICD-10-CM

## 2020-02-13 DIAGNOSIS — E785 Hyperlipidemia, unspecified: Secondary | ICD-10-CM | POA: Diagnosis present

## 2020-02-13 DIAGNOSIS — E119 Type 2 diabetes mellitus without complications: Secondary | ICD-10-CM | POA: Diagnosis present

## 2020-02-13 DIAGNOSIS — H9190 Unspecified hearing loss, unspecified ear: Secondary | ICD-10-CM | POA: Diagnosis present

## 2020-02-13 DIAGNOSIS — D6859 Other primary thrombophilia: Secondary | ICD-10-CM | POA: Diagnosis present

## 2020-02-13 DIAGNOSIS — Z7901 Long term (current) use of anticoagulants: Secondary | ICD-10-CM

## 2020-02-13 DIAGNOSIS — Z8249 Family history of ischemic heart disease and other diseases of the circulatory system: Secondary | ICD-10-CM

## 2020-02-13 DIAGNOSIS — I4510 Unspecified right bundle-branch block: Secondary | ICD-10-CM | POA: Diagnosis present

## 2020-02-13 DIAGNOSIS — I959 Hypotension, unspecified: Secondary | ICD-10-CM | POA: Diagnosis present

## 2020-02-13 DIAGNOSIS — Z7984 Long term (current) use of oral hypoglycemic drugs: Secondary | ICD-10-CM

## 2020-02-13 DIAGNOSIS — Z800 Family history of malignant neoplasm of digestive organs: Secondary | ICD-10-CM

## 2020-02-13 DIAGNOSIS — I10 Essential (primary) hypertension: Secondary | ICD-10-CM | POA: Diagnosis present

## 2020-02-13 DIAGNOSIS — D649 Anemia, unspecified: Secondary | ICD-10-CM | POA: Diagnosis present

## 2020-02-13 DIAGNOSIS — IMO0002 Reserved for concepts with insufficient information to code with codable children: Secondary | ICD-10-CM | POA: Diagnosis present

## 2020-02-13 DIAGNOSIS — Z86711 Personal history of pulmonary embolism: Secondary | ICD-10-CM

## 2020-02-13 DIAGNOSIS — Z8673 Personal history of transient ischemic attack (TIA), and cerebral infarction without residual deficits: Secondary | ICD-10-CM

## 2020-02-13 DIAGNOSIS — Z96641 Presence of right artificial hip joint: Secondary | ICD-10-CM | POA: Diagnosis present

## 2020-02-13 DIAGNOSIS — Z8546 Personal history of malignant neoplasm of prostate: Secondary | ICD-10-CM

## 2020-02-13 DIAGNOSIS — E1141 Type 2 diabetes mellitus with diabetic mononeuropathy: Secondary | ICD-10-CM | POA: Diagnosis present

## 2020-02-13 DIAGNOSIS — Z20822 Contact with and (suspected) exposure to covid-19: Secondary | ICD-10-CM | POA: Diagnosis present

## 2020-02-13 DIAGNOSIS — N401 Enlarged prostate with lower urinary tract symptoms: Secondary | ICD-10-CM | POA: Diagnosis present

## 2020-02-13 DIAGNOSIS — R55 Syncope and collapse: Secondary | ICD-10-CM

## 2020-02-13 DIAGNOSIS — D62 Acute posthemorrhagic anemia: Principal | ICD-10-CM | POA: Diagnosis present

## 2020-02-13 DIAGNOSIS — Z923 Personal history of irradiation: Secondary | ICD-10-CM

## 2020-02-13 LAB — COMPREHENSIVE METABOLIC PANEL
ALT: 27 U/L (ref 0–44)
AST: 28 U/L (ref 15–41)
Albumin: 2.9 g/dL — ABNORMAL LOW (ref 3.5–5.0)
Alkaline Phosphatase: 66 U/L (ref 38–126)
Anion gap: 7 (ref 5–15)
BUN: 25 mg/dL — ABNORMAL HIGH (ref 8–23)
CO2: 25 mmol/L (ref 22–32)
Calcium: 8.3 mg/dL — ABNORMAL LOW (ref 8.9–10.3)
Chloride: 97 mmol/L — ABNORMAL LOW (ref 98–111)
Creatinine, Ser: 0.98 mg/dL (ref 0.61–1.24)
GFR calc Af Amer: >60 mL/min (ref >60)
GFR calc non Af Amer: >60 mL/min (ref >60)
Glucose, Bld: 177 mg/dL — ABNORMAL HIGH (ref 70–99)
Potassium: 4.6 mmol/L (ref 3.5–5.1)
Sodium: 129 mmol/L — ABNORMAL LOW (ref 135–145)
Total Bilirubin: 0.7 mg/dL (ref 0.3–1.2)
Total Protein: 6 g/dL — ABNORMAL LOW (ref 6.5–8.1)

## 2020-02-13 LAB — CBC WITH DIFFERENTIAL/PLATELET
Abs Immature Granulocytes: 0.09 10*3/uL — ABNORMAL HIGH (ref 0.00–0.07)
Basophils Absolute: 0 10*3/uL (ref 0.0–0.1)
Basophils Relative: 0 %
Eosinophils Absolute: 0 10*3/uL (ref 0.0–0.5)
Eosinophils Relative: 0 %
HCT: 23 % — ABNORMAL LOW (ref 39.0–52.0)
Hemoglobin: 7.4 g/dL — ABNORMAL LOW (ref 13.0–17.0)
Immature Granulocytes: 1 %
Lymphocytes Relative: 8 %
Lymphs Abs: 0.7 10*3/uL (ref 0.7–4.0)
MCH: 28 pg (ref 26.0–34.0)
MCHC: 32.2 g/dL (ref 30.0–36.0)
MCV: 87.1 fL (ref 80.0–100.0)
Monocytes Absolute: 0.7 10*3/uL (ref 0.1–1.0)
Monocytes Relative: 8 %
Neutro Abs: 7.3 10*3/uL (ref 1.7–7.7)
Neutrophils Relative %: 83 %
Platelets: 196 10*3/uL (ref 150–400)
RBC: 2.64 MIL/uL — ABNORMAL LOW (ref 4.22–5.81)
RDW: 13.8 % (ref 11.5–15.5)
WBC: 8.9 10*3/uL (ref 4.0–10.5)
nRBC: 0 % (ref 0.0–0.2)

## 2020-02-13 LAB — URINALYSIS, ROUTINE W REFLEX MICROSCOPIC
Bilirubin Urine: NEGATIVE
Glucose, UA: NEGATIVE mg/dL
Hgb urine dipstick: NEGATIVE
Ketones, ur: NEGATIVE mg/dL
Leukocytes,Ua: NEGATIVE
Nitrite: NEGATIVE
Protein, ur: NEGATIVE mg/dL
Specific Gravity, Urine: 1.026 (ref 1.005–1.030)
pH: 5 (ref 5.0–8.0)

## 2020-02-13 LAB — TROPONIN I (HIGH SENSITIVITY)
Troponin I (High Sensitivity): 7 ng/L (ref <18)
Troponin I (High Sensitivity): 7 ng/L (ref <18)

## 2020-02-13 LAB — PROTIME-INR
INR: 1.3 — ABNORMAL HIGH (ref 0.8–1.2)
INR: 1.3 — ABNORMAL HIGH (ref 0.8–1.2)
Prothrombin Time: 15.6 seconds — ABNORMAL HIGH (ref 11.4–15.2)
Prothrombin Time: 15.5 seconds — ABNORMAL HIGH (ref 11.4–15.2)

## 2020-02-13 LAB — CBG MONITORING, ED: Glucose-Capillary: 127 mg/dL — ABNORMAL HIGH (ref 70–99)

## 2020-02-13 LAB — BRAIN NATRIURETIC PEPTIDE: B Natriuretic Peptide: 66.5 pg/mL (ref 0.0–100.0)

## 2020-02-13 MED ORDER — IOHEXOL 350 MG/ML SOLN
100.0000 mL | Freq: Once | INTRAVENOUS | Status: AC | PRN
  Administered 2020-02-13: 100 mL via INTRAVENOUS

## 2020-02-13 NOTE — ED Notes (Signed)
Pt transported to CT ?

## 2020-02-13 NOTE — Progress Notes (Signed)
Physical Therapy Treatment Patient Details Name: Colton Miranda MRN: [id number] DOB: 11/15/1940 Today's Date: 02/13/2020    History of Present Illness Patient is 79 y.o. male s/p Lt TKA on 02/10/20 with PMH significant for DM, Stroke, protate cancer, PAF, OA, HTN, HLD, HOH, hx of DVT and PE, COPD, Rt THA In 2010.    PT Comments    POD # 3 pm session Assisted OOB to amb a decreased distance due to increased c/o fatigue.  Practiced one step up backward with spouse present to observe.  Assisted to recliner.  Addressed all mobility questions, discussed appropriate activity, educated on use of ICE.  Pt ready for D/C to home.   Follow Up Recommendations  Follow surgeon's recommendation for DC plan and follow-up therapies;Home health PT     Equipment Recommendations  None recommended by PT    Recommendations for Other Services       Precautions / Restrictions Precautions Precautions: Knee;Fall Precaution Comments: reviewed no pillow under knee Restrictions Weight Bearing Restrictions: No Other Position/Activity Restrictions: WBAT    Mobility  Bed Mobility Overal bed mobility: Needs Assistance Bed Mobility: Supine to Sit;Sit to Supine     Supine to sit: Min assist;HOB elevated Sit to supine: Mod assist   General bed mobility comments: demonstarted and instructed how to use belt to self assist LE  Transfers Overall transfer level: Needs assistance Equipment used: Rolling walker (2 wheeled) Transfers: Sit to/from [employer] Transfers Sit to Stand: Supervision Stand pivot transfers: Supervision;Min guard       General transfer comment: increased time x 2 but self able with 25% VC's on proper hand placement and safety with turns  Ambulation/Gait Ambulation/Gait assistance: Supervision Gait Distance (Feet): 16 Feet Assistive device: Rolling walker (2 wheeled) Gait Pattern/deviations: Step-through pattern;Decreased stride length Gait velocity: decreased    General Gait Details: increased time with cues for sequence, posture and position from RW   Stairs Stairs: Yes Stairs assistance: Min guard;Min assist Stair Management: No rails;Backwards Number of Stairs: 1 General stair comments: with spouse present and 25% VC's on proper sequencing   Wheelchair Mobility    Modified Rankin (Stroke Patients Only)       Balance                                            Cognition Arousal/Alertness: Awake/alert Behavior During Therapy: WFL for tasks assessed/performed Overall Cognitive Status: Within Functional Limits for tasks assessed                                 General Comments: AxO x 4      Exercises      General Comments        Pertinent Vitals/Pain Pain Assessment: 0-10 Pain Score: 5  Pain Location: Lt knee Pain Descriptors / Indicators: Aching;Sore;Operative site guarding;Tender Pain Intervention(s): Monitored during session;Premedicated before session;Repositioned;Ice applied    Home Living                      Prior Function            PT Goals (current goals can now be found in the care plan section) Progress towards PT goals: Progressing toward goals    Frequency    7X/week      PT Plan Current plan remains  appropriate    Co-evaluation              AM-PAC PT "6 Clicks" Mobility   Outcome Measure  Help needed turning from your back to your side while in a flat bed without using bedrails?: A Little Help needed moving from lying on your back to sitting on the side of a flat bed without using bedrails?: A Little Help needed moving to and from a bed to a chair (including a wheelchair)?: A Little Help needed standing up from a chair using your arms (e.g., wheelchair or bedside chair)?: A Little Help needed to walk in hospital room?: A Little Help needed climbing 3-5 steps with a railing? : A Lot 6 Click Score: 17    End of Session Equipment Utilized  During Treatment: Gait belt Activity Tolerance: Patient tolerated treatment well Patient left: in chair;with call bell/phone within reach;with family/visitor present Nurse Communication: Mobility status PT Visit Diagnosis: Muscle weakness (generalized) (M62.81);Difficulty in walking, not elsewhere classified (R26.2)     Time: [id number] PT Time Calculation (min) (ACUTE ONLY): 25 min  Charges:  $Gait Training: 8-22 mins $Therapeutic Activity: 8-22 mins                     Rica Koyanagi  PTA Acute  Rehabilitation Services Pager      [phone] Office      [phone]

## 2020-02-13 NOTE — Plan of Care (Signed)

## 2020-02-13 NOTE — ED Triage Notes (Addendum)
Pt BIB EMS from home. EMS reports pt was discharged today post-knee replacement. Reports pt had near syncopal episode getting out of his car. When FD arrived, pts sats in [id number], systolic in the 0000000, and CBG was 64. No hx of diabetes.  150 cc NS and 15G of glucose given by ems

## 2020-02-13 NOTE — Progress Notes (Signed)
Physical Therapy Treatment Patient Details Name: Colton Miranda MRN: [id number] DOB: November 23, 1940 Today's Date: 02/13/2020    History of Present Illness Patient is 79 y.o. male s/p Lt TKA on 02/10/20 with PMH significant for DM, Stroke, protate cancer, PAF, OA, HTN, HLD, HOH, hx of DVT and PE, COPD, Rt THA In 2010.    PT Comments    POD # 3 am session Assisted OOB. General bed mobility comments: demonstarted and instructed how to use belt to self assist LE.   General transfer comment: increased time x 2 but self able with 25% VC's on proper hand placement and safety with turns.  Then returned to room to perform some TE's following HEP handout.  Instructed on proper tech, freq as well as use of ICE.   Pt will need another PT session to complete HEP and practice one step while spouse is here.    Follow Up Recommendations  Follow surgeon's recommendation for DC plan and follow-up therapies;Home health PT     Equipment Recommendations  None recommended by PT    Recommendations for Other Services       Precautions / Restrictions Precautions Precautions: Knee;Fall Precaution Comments: reviewed no pillow under knee Restrictions Weight Bearing Restrictions: No Other Position/Activity Restrictions: WBAT    Mobility  Bed Mobility Overal bed mobility: Needs Assistance Bed Mobility: Supine to Sit;Sit to Supine     Supine to sit: Min assist;HOB elevated Sit to supine: Mod assist   General bed mobility comments: demonstarted and instructed how to use belt to self assist LE  Transfers Overall transfer level: Needs assistance Equipment used: Rolling walker (2 wheeled) Transfers: Sit to/from [employer] Transfers Sit to Stand: Supervision Stand pivot transfers: Supervision;Min guard       General transfer comment: increased time x 2 but self able with 25% VC's on proper hand placement and safety with turns  Ambulation/Gait Ambulation/Gait assistance: Supervision Gait  Distance (Feet): 27 Feet Assistive device: Rolling walker (2 wheeled) Gait Pattern/deviations: Step-through pattern;Decreased stride length Gait velocity: decreased   General Gait Details: increased time with cues for sequence, posture and position from [employer]             Wheelchair Mobility    Modified Rankin (Stroke Patients Only)       Balance                                            Cognition Arousal/Alertness: Awake/alert Behavior During Therapy: WFL for tasks assessed/performed Overall Cognitive Status: Within Functional Limits for tasks assessed                                 General Comments: AxO x 4      Exercises   Total Knee Replacement TE's following HEP handout 10 reps B LE ankle pumps 05 reps towel squeezes 05 reps knee presses 05 reps heel slides  05 reps SAQ's 05 reps SLR's 05 reps ABD Educated on use of gait belt to assist with TE's Followed by ICE     General Comments        Pertinent Vitals/Pain Pain Assessment: 0-10 Pain Score: 5  Pain Location: Lt knee Pain Descriptors / Indicators: Aching;Sore;Operative site guarding;Tender Pain Intervention(s): Monitored during session;Premedicated before session;Repositioned;Ice applied    Home Living  Prior Function            PT Goals (current goals can now be found in the care plan section) Progress towards PT goals: Progressing toward goals    Frequency    7X/week      PT Plan Current plan remains appropriate    Co-evaluation              AM-PAC PT "6 Clicks" Mobility   Outcome Measure  Help needed turning from your back to your side while in a flat bed without using bedrails?: A Little Help needed moving from lying on your back to sitting on the side of a flat bed without using bedrails?: A Little Help needed moving to and from a bed to a chair (including a wheelchair)?: A Little Help needed  standing up from a chair using your arms (e.g., wheelchair or bedside chair)?: A Little Help needed to walk in hospital room?: A Little Help needed climbing 3-5 steps with a railing? : A Lot 6 Click Score: 17    End of Session Equipment Utilized During Treatment: Gait belt Activity Tolerance: Patient tolerated treatment well Patient left: in bed;with call bell/phone within reach;with family/visitor present Nurse Communication: Mobility status PT Visit Diagnosis: Muscle weakness (generalized) (M62.81);Difficulty in walking, not elsewhere classified (R26.2)     Time: 1120-1200 PT Time Calculation (min) (ACUTE ONLY): 40 min  Charges:  $Gait Training: 8-22 mins $Therapeutic Exercise: 8-22 mins $Therapeutic Activity: 8-22 mins                     Rica Koyanagi  PTA Acute  Rehabilitation Services Pager      [phone] Office      [phone]

## 2020-02-13 NOTE — Progress Notes (Signed)
Subjective: 3 Days Post-Op Procedure(s) (LRB): TOTAL KNEE ARTHROPLASTY (Left) Patient reports pain as 4 on 0-10 scale.   Patient denies any events overnight.  Denies any chest pain, shortness of breath.  Denies any numbness, tingling. Patient has been able to void,+ flatus no bowel movement yet Patient reported marked fatigue with therapy yesterday, states he is feeling better today and has a positive mindset for his therapy sessions today.  Objective: Vital signs in last 24 hours: Temp:  [97.7 F (36.5 C)-98.2 F (36.8 C)] 97.7 F (36.5 C) (05/24 0512) Pulse Rate:  [79-91] 79 (05/24 0512) Resp:  [16-18] 16 (05/24 0512) BP: (115-134)/(52-63) 126/55 (05/24 0512) SpO2:  [94 %-98 %] 95 % (05/24 0512)  Intake/Output from previous day: 05/23 0701 - 05/24 0700 In: 440 [P.O.:440] Out: 900 [Urine:900] Intake/Output this shift: No intake/output data recorded.  No results for input(s): HGB in the last 72 hours. No results for input(s): WBC, RBC, HCT, PLT in the last 72 hours. No results for input(s): NA, K, CL, CO2, BUN, CREATININE, GLUCOSE, CALCIUM in the last 72 hours. Recent Labs    02/12/20 0259 02/13/20 0319  INR 1.1 1.3*    Neurologically intact Neurovascular intact Sensation intact distally Intact pulses distally Dorsiflexion/Plantar flexion intact Incision: dressing C/D/I No cellulitis present Compartment soft   Assessment/Plan: 3 Days Post-Op Procedure(s) (LRB): TOTAL KNEE ARTHROPLASTY (Left) Up with therapy, WBAT left leg If patient gets cleared by therapy today he is able to go home this afternoon Medications of already been sent to his pharmacy DVT prophylaxis he will continue his home regimen of Coumadin Follow-up in the office in 2 weeks    Patient's anticipated LOS is less than 2 midnights, meeting these requirements: - Younger than 16 - Lives within 1 hour of care - Has a competent adult at home to recover with post-op recover - NO history of  -  Chronic pain requiring opiods  - Diabetes  - Coronary Artery Disease  - Heart failure  - Heart attack  - Stroke  - DVT/VTE  - Cardiac arrhythmia  - Respiratory Failure/COPD  - Renal failure  - Anemia  - Advanced Liver disease       Marlin Jarrard R Marquesha Robideau 02/13/2020, 8:03 AM

## 2020-02-13 NOTE — ED Provider Notes (Addendum)
[facility] DEPT Provider Note   CSN: [id number] Arrival date & time: 02/13/20  1757     History Chief Complaint  Patient presents with  . Near Syncope    Colton Miranda is a 79 y.o. male.  Presented to ER with concern for episode of collapsing earlier this evening.  Recently discharged after knee replacement.  While he was getting out of the car suddenly felt weak, like his legs were giving out on him, went slowly down.  Did not hit his head, did not have full syncope.  No associated chest pain or shortness of breath.  No fever, no changes in his knees.  No known injuries.  No known complications with surgery.  Reviewed chart, recent op note.  HPI     Past Medical History:  Diagnosis Date  . BPH associated with nocturia   . Chronic constipation   . COPD (chronic obstructive pulmonary disease) ([place])   . Eczema   . Heart murmur    at birth  . History of adenomatous polyp of colon   . History of basal cell carcinoma (BCC) of skin    post excision from trunk  . 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 20 years ago , 1999, caused by blood clot after knee scope"  DVT to PE  . History of depression    over 40 years ago nervous breakdown  . History of DVT (deep vein thrombosis)    11-06-2017 per pt and wife happened lower leg approx. 1999 after knee scope  . History of pulmonary embolus (PE)    11-06-2017 from DVT in approx. 1999 per wife and pt  . HOH (hard of hearing)   . Hyperlipidemia   . Hypertension    followed by pcp  . Long term (current) use of anticoagulants Coumadin-- followed by Pharmasist w/ [facility]   secondary to primary hypercoagulopathy w/ hx DVT and PE  . Myalgia and myositis, unspecified   . OA (osteoarthritis)    "all joints"  . PAF (paroxysmal atrial fibrillation) ([place]) 2019  . Peripheral neuropathy   . Pinched nerve in neck    per pt causes intermittant numbness bilateral arms and hands  . Primary hypercoagulable state ([place])   . Prostate cancer [facility]) urologist-  dr Alyson Ingles  oncologist-- dr Tammi Klippel   dx 08-28-2017  Stage T1c, Gleason 4+4, PSA 14.1, vol 42.8cc-- planned treatment external radiation and ADT  . 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 Protein S deficiency ([place])   . Right bundle branch block   . Seborrheic dermatitis, unspecified   . Stroke ([place])   . Type 2 diabetes mellitus ([place])   . Urinary frequency   . Wears glasses     Patient Active Problem List   Diagnosis Date Noted  . Anemia 02/14/2020  . Hyponatremia 02/14/2020  . S/P total knee arthroplasty, left 02/14/2020  . Osteoarthritis of left knee 02/10/2020  . Contact dermatitis 01/19/2020  . PAF (paroxysmal atrial fibrillation) ([place]) 04/14/2018  . Malignant neoplasm of prostate ([place]) 10/07/2017  . Colon polyps 11/26/2016  . Influenza vaccination declined 05/28/2016  . History of fall 07/25/2015  . Palpitations 11/08/2014  . Pain of left arm 11/08/2014  . Pain in joint, lower leg 02/28/2014  . Essential hypertension 02/28/2014  . Disturbance of skin sensation 02/23/2013  . Insomnia, unspecified 02/23/2013  . Obesity   . DM (diabetes mellitus), secondary, uncontrolled,  with peripheral vascular complications ([place])   . Hyperlipemia   . Urinary frequency   . Primary hypercoagulable state ([place]) 01/03/2013  . Other pulmonary embolism and infarction 01/03/2013  . Long term current use of anticoagulant therapy 01/03/2013    Past Surgical History:  Procedure Laterality Date  . CHOLECYSTECTOMY OPEN  1981  . COLONOSCOPY  last one 01-28-2017  . GOLD SEED IMPLANT N/A 11/16/2017   Procedure: GOLD SEED IMPLANT;  Surgeon: Cleon Gustin, MD;  [facility]  Service: Urology;  Laterality: N/A;  . KNEE ARTHROSCOPY Right 1990s  . PILONIDAL CYST EXCISION  1968  . PROSTATE BIOPSY  09/10/2017    (done in office)   + Pos for  Prostate Cancer, Dr.MCkinzie( [facility])   . SPACE OAR INSTILLATION N/A 11/16/2017   Procedure: SPACE OAR INSTILLATION;  Surgeon: Cleon Gustin, MD;  Location: [facility];  Service: Urology;  Laterality: N/A;  . TOTAL HIP ARTHROPLASTY Right 06-18-2009   dr Wynelle Link  [facility]  . TOTAL KNEE ARTHROPLASTY Left 02/10/2020   Procedure: TOTAL KNEE ARTHROPLASTY;  Surgeon: Sydnee Cabal, MD;  Location: [facility];  Service: Orthopedics;  Laterality: Left;  adductor canale       Family History  Problem Relation Age of Onset  . Cancer Mother 8       colon , breast  . Colon cancer Mother   . Heart attack Daughter        2008  . Parkinson's disease Sister   . Cancer Sister        breast  . Cancer Maternal Uncle        melanoma  . Cancer Paternal Uncle     Social History   Tobacco Use  . Smoking status: Former Smoker    Years: 50.00    Types: Cigarettes    Quit date: 01/03/2009    Years since quitting: 11.1  . Smokeless tobacco: Never Used  Substance Use Topics  . Alcohol use: Yes    Alcohol/week: 3.0 standard drinks    Types: 3 Cans of beer per week    Comment: 1-2 beers a week  . Drug use: No    Home Medications Prior to Admission medications   Medication Sig Start Date End Date Taking? Authorizing Provider  acetaminophen (TYLENOL) 500 MG tablet Take 1,000 mg by mouth every 6 (six) hours as needed for moderate pain.    [provider]  alfuzosin (UROXATRAL) 10 MG 24 hr tablet Take 1 tablet (10 mg total) by mouth daily with breakfast. 07/16/18   Bruning, Ashlyn, PA-C  Blood Glucose Monitoring Suppl (ONETOUCH VERIO) w/Device KIT by Does not apply route. Check blood sugar twice daily as directed DX E11.65    [provider]  Cholecalciferol (VITAMIN D3) 50 MCG (2000 UT) TABS Take 2,000 Units by mouth daily.     [provider]  diclofenac Sodium (VOLTAREN) 1 % GEL Apply 1 application topically at bedtime.    [provider]    diphenhydrAMINE (SOMINEX) 25 MG tablet Take 25 mg by mouth at bedtime as needed for sleep.    [provider]  enoxaparin (LOVENOX) 100 MG/ML injection Inject 1 mL (100 mg total) into the skin every 12 (twelve) hours. Stop Lovenox once INR is therapeutic 2-3 02/17/20   Antonieta Pert, MD  lisinopril (PRINIVIL,ZESTRIL) 20 MG tablet Take 1 tablet (20 mg total) by mouth daily. 03/30/18   Lauree Chandler, NP  metFORMIN (GLUCOPHAGE) 500 MG tablet TAKE  1 TABLET BY MOUTH ONCE DAILY WITH BREAKFAST Patient taking differently: Take 500 mg by mouth daily with breakfast.  01/25/18   Lauree Chandler, NP  methocarbamol (ROBAXIN) 500 MG tablet Take 1 tablet (500 mg total) by mouth 4 (four) times daily. 02/10/20   Drue Novel, PA  nystatin cream (MYCOSTATIN) Apply 1 application topically daily as needed (yeast).    [provider]  ondansetron (ZOFRAN) 4 MG tablet Take 1 tablet (4 mg total) by mouth daily as needed for nausea or vomiting. 02/10/20 02/09/21  Haus, Cordelia Pen, PA  ONETOUCH DELICA LANCETS 12R MISC USE 1  TO CHECK GLUCOSE TWICE DAILY Patient taking differently: 2 (two) times daily.  03/02/18   Lauree Chandler, NP  ONETOUCH VERIO test strip USE 1 STRIP TO CHECK GLUCOSE TWICE DAILY AS DIRECTED Patient taking differently: 2 (two) times daily.  03/02/18   Lauree Chandler, NP  OVER THE COUNTER MEDICATION Apply 1 application topically every other day. Foot miracle cream - apply every other night    [provider]  oxyCODONE (ROXICODONE) 5 MG immediate release tablet Take 1 tablet (5 mg total) by mouth every 4 (four) hours as needed for up to 6 doses for severe pain. 02/17/20   Antonieta Pert, MD  senna-docusate (SENOKOT-S) 8.6-50 MG tablet Take 1 tablet by mouth at bedtime.    [provider]  simvastatin (ZOCOR) 20 MG tablet TAKE 1 TABLET BY MOUTH ONCE DAILY FOR CHOLESTEROL Patient taking differently: Take 20 mg by mouth daily.  03/02/18   Lauree Chandler, NP  tolnaftate  (TINACTIN) 1 % cream Apply 1 application topically every other day. At night    [provider]  traMADol (ULTRAM) 50 MG tablet Take one to two tablets by mouth every 6 hours as needed for pain. Do not take more than 6 tablets in 24 hours 02/17/20   Antonieta Pert, MD  warfarin (COUMADIN) 10 MG tablet TAKE 1 TABLET BY MOUTH ONCE DAILY ALONG  WITH  A  5  MG  TABLET  FOR  A  TOTAL  OF  15  MG  DAILY Patient taking differently: Take 10 mg by mouth See admin instructions. Take 10 mg with the 7.5 mg to equal 17.5 mg after supper on Sun/Mon/Wed/Fri/Sat and 10 mg with the 5 mg to equal 15 mg after supper on Tues/Thurs 04/14/18   Hendricks Limes, MD  warfarin (COUMADIN) 5 MG tablet Take 5-7.5 mg by mouth See admin instructions. Take 7.5 mg with the 10 mg to equal 17.5 mg after supper on Sun/Mon/Wed/Fri/Sat and 5 mg with the 10 mg to equal 15 mg after supper on Tues/Thurs    [provider]    Allergies    Patient has no known allergies.  Review of Systems   Review of Systems  Constitutional: Negative for chills and fever.  HENT: Negative for ear pain and sore throat.   Eyes: Negative for pain and visual disturbance.  Respiratory: Negative for cough and shortness of breath.   Cardiovascular: Negative for chest pain and palpitations.  Gastrointestinal: Negative for abdominal pain and vomiting.  Genitourinary: Negative for dysuria and hematuria.  Musculoskeletal: Positive for arthralgias. Negative for back pain.  Skin: Negative for color change and rash.  Neurological: Positive for syncope and light-headedness. Negative for seizures.  All other systems reviewed and are negative.   Physical Exam Updated Vital Signs BP (!) 142/57 (BP Location: Right Arm)   Pulse 68   Temp 98.9  F (37.2 C) (Oral)   Resp 17   Ht 5' 4"  (1.626 m)   Wt 112.5 kg   SpO2 97%   BMI 42.57 kg/m   Physical Exam Vitals and nursing note reviewed.  Constitutional:      Appearance: He is well-developed.    HENT:     Head: Normocephalic and atraumatic.  Eyes:     Conjunctiva/sclera: Conjunctivae normal.  Cardiovascular:     Rate and Rhythm: Normal rate and regular rhythm.     Heart sounds: No murmur.  Pulmonary:     Effort: Pulmonary effort is normal. No respiratory distress.     Breath sounds: Normal breath sounds.  Abdominal:     Palpations: Abdomen is soft.     Tenderness: There is no abdominal tenderness.  Musculoskeletal:     Cervical back: Neck supple.     Comments: Left knee mild swelling, incision site is clean dry intact, DP, PT pulses intact, distal sensation intact  Skin:    General: Skin is warm and dry.  Neurological:     General: No focal deficit present.     Mental Status: He is alert and oriented to person, place, and time.  Psychiatric:        Mood and Affect: Mood normal.        Behavior: Behavior normal.     ED Results / Procedures / Treatments   Labs (all labs ordered are listed, but only abnormal results are displayed) Labs Reviewed  CBC WITH DIFFERENTIAL/PLATELET - Abnormal; Notable for the following components:      Result Value   RBC 2.64 (*)    Hemoglobin 7.4 (*)    HCT 23.0 (*)    Abs Immature Granulocytes 0.09 (*)    All other components within normal limits  COMPREHENSIVE METABOLIC PANEL - Abnormal; Notable for the following components:   Sodium 129 (*)    Chloride 97 (*)    Glucose, Bld 177 (*)    BUN 25 (*)    Calcium 8.3 (*)    Total Protein 6.0 (*)    Albumin 2.9 (*)    All other components within normal limits  PROTIME-INR - Abnormal; Notable for the following components:   Prothrombin Time 15.5 (*)    INR 1.3 (*)    All other components within normal limits  BASIC METABOLIC PANEL - Abnormal; Notable for the following components:   Sodium 133 (*)    Glucose, Bld 103 (*)    Calcium 8.3 (*)    All other components within normal limits  CBC - Abnormal; Notable for the following components:   RBC 2.75 (*)    Hemoglobin 7.7 (*)     HCT 24.1 (*)    All other components within normal limits  PROTIME-INR - Abnormal; Notable for the following components:   Prothrombin Time 16.7 (*)    INR 1.4 (*)    All other components within normal limits  HEMOGLOBIN AND HEMATOCRIT, BLOOD - Abnormal; Notable for the following components:   Hemoglobin 7.6 (*)    HCT 23.7 (*)    All other components within normal limits  GLUCOSE, CAPILLARY - Abnormal; Notable for the following components:   Glucose-Capillary 107 (*)    All other components within normal limits  HEMOGLOBIN AND HEMATOCRIT, BLOOD - Abnormal; Notable for the following components:   Hemoglobin 8.1 (*)    HCT 25.3 (*)    All other components within normal limits  GLUCOSE, CAPILLARY - Abnormal; Notable for the  following components:   Glucose-Capillary 105 (*)    All other components within normal limits  PROTIME-INR - Abnormal; Notable for the following components:   Prothrombin Time 17.6 (*)    INR 1.5 (*)    All other components within normal limits  BASIC METABOLIC PANEL - Abnormal; Notable for the following components:   Sodium 133 (*)    Glucose, Bld 108 (*)    Calcium 8.0 (*)    All other components within normal limits  CBC - Abnormal; Notable for the following components:   RBC 2.79 (*)    Hemoglobin 7.9 (*)    HCT 24.6 (*)    All other components within normal limits  HEMOGLOBIN AND HEMATOCRIT, BLOOD - Abnormal; Notable for the following components:   Hemoglobin 8.2 (*)    HCT 26.1 (*)    All other components within normal limits  GLUCOSE, CAPILLARY - Abnormal; Notable for the following components:   Glucose-Capillary 108 (*)    All other components within normal limits  HEMOGLOBIN AND HEMATOCRIT, BLOOD - Abnormal; Notable for the following components:   Hemoglobin 8.2 (*)    HCT 26.1 (*)    All other components within normal limits  GLUCOSE, CAPILLARY - Abnormal; Notable for the following components:   Glucose-Capillary 115 (*)    All other  components within normal limits  GLUCOSE, CAPILLARY - Abnormal; Notable for the following components:   Glucose-Capillary 102 (*)    All other components within normal limits  PROTIME-INR - Abnormal; Notable for the following components:   Prothrombin Time 17.5 (*)    INR 1.5 (*)    All other components within normal limits  BASIC METABOLIC PANEL - Abnormal; Notable for the following components:   Sodium 132 (*)    Glucose, Bld 107 (*)    Calcium 8.2 (*)    All other components within normal limits  CBC - Abnormal; Notable for the following components:   RBC 2.86 (*)    Hemoglobin 7.9 (*)    HCT 25.5 (*)    All other components within normal limits  GLUCOSE, CAPILLARY - Abnormal; Notable for the following components:   Glucose-Capillary 106 (*)    All other components within normal limits  HEMOGLOBIN AND HEMATOCRIT, BLOOD - Abnormal; Notable for the following components:   Hemoglobin 8.0 (*)    HCT 25.8 (*)    All other components within normal limits  GLUCOSE, CAPILLARY - Abnormal; Notable for the following components:   Glucose-Capillary 104 (*)    All other components within normal limits  PROTIME-INR - Abnormal; Notable for the following components:   Prothrombin Time 19.3 (*)    INR 1.7 (*)    All other components within normal limits  BASIC METABOLIC PANEL - Abnormal; Notable for the following components:   Glucose, Bld 101 (*)    Calcium 8.3 (*)    All other components within normal limits  CBC - Abnormal; Notable for the following components:   RBC 2.91 (*)    Hemoglobin 8.1 (*)    HCT 25.7 (*)    All other components within normal limits  GLUCOSE, CAPILLARY - Abnormal; Notable for the following components:   Glucose-Capillary 102 (*)    All other components within normal limits  GLUCOSE, CAPILLARY - Abnormal; Notable for the following components:   Glucose-Capillary 122 (*)    All other components within normal limits  GLUCOSE, CAPILLARY - Abnormal; Notable for  the following components:   Glucose-Capillary 105 (*)  All other components within normal limits  GLUCOSE, CAPILLARY - Abnormal; Notable for the following components:   Glucose-Capillary 160 (*)    All other components within normal limits  CBG MONITORING, ED - Abnormal; Notable for the following components:   Glucose-Capillary 127 (*)    All other components within normal limits  SARS CORONAVIRUS 2 BY RT PCR (HOSPITAL ORDER, [facility])  BRAIN NATRIURETIC PEPTIDE  URINALYSIS, ROUTINE W REFLEX MICROSCOPIC  GLUCOSE, CAPILLARY  GLUCOSE, CAPILLARY  GLUCOSE, CAPILLARY  GLUCOSE, CAPILLARY  TYPE AND SCREEN  PREPARE RBC (CROSSMATCH)  TROPONIN I (HIGH SENSITIVITY)  TROPONIN I (HIGH SENSITIVITY)    EKG EKG Interpretation  Date/Time:  Monday Feb 13 2020 19:54:55 EDT Ventricular Rate:  80 PR Interval:    QRS Duration: 146 QT Interval:  400 QTC Calculation: 462 R Axis:   -3 Text Interpretation: Unknown rhythm, irregular rate Right bundle branch block Artifact in lead(s) I II aVR Poor data quality interpretation difficulty Confirmed by Merrily Pew [id number]) on 02/14/2020 9:21:45 AM   Radiology No results found.  Procedures .Critical Care Performed by: Lucrezia Starch, MD Authorized by: Lucrezia Starch, MD   Critical care provider statement:    Critical care time (minutes):  35   Critical care was time spent personally by me on the following activities:  Discussions with consultants, evaluation of patient's response to treatment, examination of patient, ordering and performing treatments and interventions, ordering and review of laboratory studies, ordering and review of radiographic studies, pulse oximetry, re-evaluation of patient's condition, obtaining history from patient or surrogate and review of old charts   (including critical care time)  Medications Ordered in ED Medications  iohexol (OMNIPAQUE) 350 MG/ML injection 100 mL (100 mLs  Intravenous Contrast Given 02/13/20 2145)  warfarin (COUMADIN) tablet 17.5 mg (17.5 mg Oral Given 02/14/20 0220)  enoxaparin (LOVENOX) injection 100 mg (100 mg Subcutaneous Given 02/14/20 0221)  warfarin (COUMADIN) tablet 15 mg (15 mg Oral Given 02/14/20 1637)  warfarin (COUMADIN) tablet 17.5 mg (17.5 mg Oral Given 02/15/20 2008)  warfarin (COUMADIN) tablet 17.5 mg (17.5 mg Oral Given 02/16/20 2120)    ED Course  I have reviewed the triage vital signs and the nursing notes.  Pertinent labs & imaging results that were available during my care of the patient were reviewed by me and considered in my medical decision making (see chart for details).  Clinical Course as of Mar 01 1703  Mon Feb 13, 2020  2323 D/w Alvan Dame - Hgb drop within expected range, no acute intervention needed from ortho stand point - ok with dc or admit   [RD]    Clinical Course User Index [RD] Lucrezia Starch, MD   MDM Rules/Calculators/A&P                      79 year old male with recent left knee replacement presenting to ER after near syncopal episode.  Extensive medical comorbidities including DVT/PE.  On Lovenox bridge.  Here patient is well-appearing with stable vital signs.  CTA chest negative for PE.  Lab work concerning for significant drop in hemoglobin, reviewed in detail with Ortho on-call, we then expected range of hemoglobin drop.  No acute Ortho intervention needed.  Given his near syncopal episode, significant drop in hemoglobin, consulted hospitalist for admission for observation, monitoring of hemoglobin levels, particularly in setting of AC use.   Final Clinical Impression(s) / ED Diagnoses Final diagnoses:  Acute blood loss anemia  Near  syncope    Rx / DC Orders ED Discharge Orders         Ordered    enoxaparin (LOVENOX) 100 MG/ML injection  Every 12 hours     02/17/20 1137    oxyCODONE (ROXICODONE) 5 MG immediate release tablet  Every 4 hours PRN     02/17/20 1137    traMADol (ULTRAM) 50 MG  tablet     02/17/20 1137    Increase activity slowly     02/17/20 1137    Diet - low sodium heart healthy     02/17/20 1137    Discharge instructions    Comments: Follow-up with your PCP and orthopedic in 1 to 2 weeks  You are on Coumadin and Lovenox and once her INR is 2-3 stop the Lovenox and continue Coumadin and monitor INR regularly.  Please call call MD or return to ER for similar or worsening recurring problem that brought you to hospital or if any fever,nausea/vomiting,abdominal pain, uncontrolled pain, chest pain,  shortness of breath or any other alarming symptoms.  Please follow-up your doctor as instructed in a week time and call the office for appointment.  Please avoid alcohol, smoking, or any other illicit substance and maintain healthy habits including taking your regular medications as prescribed.  You were cared for by a hospitalist during your hospital stay. If you have any questions about your discharge medications or the care you received while you were in the hospital after you are discharged, you can call the unit and ask to speak with the hospitalist on call if the hospitalist that took care of you is not available.  Once you are discharged, your primary care physician will handle any further medical issues. Please note that NO REFILLS for any discharge medications will be authorized once you are discharged, as it is imperative that you return to your primary care physician (or establish a relationship with a primary care physician if you do not have one) for your aftercare needs so that they can reassess your need for medications and monitor your lab values   02/17/20 1137    Diet Carb Modified     02/17/20 1137           Lucrezia Starch, MD 02/14/20 0122    Lucrezia Starch, MD 02/29/20 1704

## 2020-02-13 NOTE — ED Notes (Signed)
Pt is unable to provide urine sample at this time. 

## 2020-02-14 DIAGNOSIS — E871 Hypo-osmolality and hyponatremia: Secondary | ICD-10-CM

## 2020-02-14 DIAGNOSIS — Z6841 Body Mass Index (BMI) 40.0 and over, adult: Secondary | ICD-10-CM | POA: Diagnosis not present

## 2020-02-14 DIAGNOSIS — J449 Chronic obstructive pulmonary disease, unspecified: Secondary | ICD-10-CM | POA: Diagnosis present

## 2020-02-14 DIAGNOSIS — E1351 Other specified diabetes mellitus with diabetic peripheral angiopathy without gangrene: Secondary | ICD-10-CM

## 2020-02-14 DIAGNOSIS — E785 Hyperlipidemia, unspecified: Secondary | ICD-10-CM | POA: Diagnosis present

## 2020-02-14 DIAGNOSIS — I480 Paroxysmal atrial fibrillation: Secondary | ICD-10-CM | POA: Diagnosis present

## 2020-02-14 DIAGNOSIS — Z7901 Long term (current) use of anticoagulants: Secondary | ICD-10-CM | POA: Diagnosis not present

## 2020-02-14 DIAGNOSIS — D649 Anemia, unspecified: Secondary | ICD-10-CM | POA: Diagnosis not present

## 2020-02-14 DIAGNOSIS — R55 Syncope and collapse: Secondary | ICD-10-CM | POA: Diagnosis present

## 2020-02-14 DIAGNOSIS — N401 Enlarged prostate with lower urinary tract symptoms: Secondary | ICD-10-CM | POA: Diagnosis present

## 2020-02-14 DIAGNOSIS — E861 Hypovolemia: Secondary | ICD-10-CM | POA: Diagnosis present

## 2020-02-14 DIAGNOSIS — Z8673 Personal history of transient ischemic attack (TIA), and cerebral infarction without residual deficits: Secondary | ICD-10-CM | POA: Diagnosis not present

## 2020-02-14 DIAGNOSIS — H9190 Unspecified hearing loss, unspecified ear: Secondary | ICD-10-CM | POA: Diagnosis present

## 2020-02-14 DIAGNOSIS — Z85828 Personal history of other malignant neoplasm of skin: Secondary | ICD-10-CM | POA: Diagnosis not present

## 2020-02-14 DIAGNOSIS — Z96641 Presence of right artificial hip joint: Secondary | ICD-10-CM | POA: Diagnosis present

## 2020-02-14 DIAGNOSIS — E1141 Type 2 diabetes mellitus with diabetic mononeuropathy: Secondary | ICD-10-CM | POA: Diagnosis present

## 2020-02-14 DIAGNOSIS — Z86711 Personal history of pulmonary embolism: Secondary | ICD-10-CM | POA: Diagnosis not present

## 2020-02-14 DIAGNOSIS — R351 Nocturia: Secondary | ICD-10-CM | POA: Diagnosis present

## 2020-02-14 DIAGNOSIS — Z86718 Personal history of other venous thrombosis and embolism: Secondary | ICD-10-CM | POA: Diagnosis not present

## 2020-02-14 DIAGNOSIS — Z96652 Presence of left artificial knee joint: Secondary | ICD-10-CM

## 2020-02-14 DIAGNOSIS — Z20822 Contact with and (suspected) exposure to covid-19: Secondary | ICD-10-CM | POA: Diagnosis present

## 2020-02-14 DIAGNOSIS — D6859 Other primary thrombophilia: Secondary | ICD-10-CM | POA: Diagnosis present

## 2020-02-14 DIAGNOSIS — I4510 Unspecified right bundle-branch block: Secondary | ICD-10-CM | POA: Diagnosis present

## 2020-02-14 DIAGNOSIS — I10 Essential (primary) hypertension: Secondary | ICD-10-CM | POA: Diagnosis present

## 2020-02-14 DIAGNOSIS — D62 Acute posthemorrhagic anemia: Secondary | ICD-10-CM | POA: Diagnosis present

## 2020-02-14 DIAGNOSIS — E1365 Other specified diabetes mellitus with hyperglycemia: Secondary | ICD-10-CM

## 2020-02-14 DIAGNOSIS — Z8546 Personal history of malignant neoplasm of prostate: Secondary | ICD-10-CM | POA: Diagnosis not present

## 2020-02-14 DIAGNOSIS — I959 Hypotension, unspecified: Secondary | ICD-10-CM | POA: Diagnosis present

## 2020-02-14 LAB — BASIC METABOLIC PANEL
Anion gap: 9 (ref 5–15)
BUN: 19 mg/dL (ref 8–23)
CO2: 25 mmol/L (ref 22–32)
Calcium: 8.3 mg/dL — ABNORMAL LOW (ref 8.9–10.3)
Chloride: 99 mmol/L (ref 98–111)
Creatinine, Ser: 0.84 mg/dL (ref 0.61–1.24)
GFR calc Af Amer: >60 mL/min (ref >60)
GFR calc non Af Amer: >60 mL/min (ref >60)
Glucose, Bld: 103 mg/dL — ABNORMAL HIGH (ref 70–99)
Potassium: 4.3 mmol/L (ref 3.5–5.1)
Sodium: 133 mmol/L — ABNORMAL LOW (ref 135–145)

## 2020-02-14 LAB — CBC
HCT: 24.1 % — ABNORMAL LOW (ref 39.0–52.0)
Hemoglobin: 7.7 g/dL — ABNORMAL LOW (ref 13.0–17.0)
MCH: 28 pg (ref 26.0–34.0)
MCHC: 32 g/dL (ref 30.0–36.0)
MCV: 87.6 fL (ref 80.0–100.0)
Platelets: 202 10*3/uL (ref 150–400)
RBC: 2.75 MIL/uL — ABNORMAL LOW (ref 4.22–5.81)
RDW: 14.1 % (ref 11.5–15.5)
WBC: 7.5 10*3/uL (ref 4.0–10.5)
nRBC: 0 % (ref 0.0–0.2)

## 2020-02-14 LAB — GLUCOSE, CAPILLARY
Glucose-Capillary: 105 mg/dL — ABNORMAL HIGH (ref 70–99)
Glucose-Capillary: 107 mg/dL — ABNORMAL HIGH (ref 70–99)
Glucose-Capillary: 108 mg/dL — ABNORMAL HIGH (ref 70–99)
Glucose-Capillary: 88 mg/dL (ref 70–99)

## 2020-02-14 LAB — HEMOGLOBIN AND HEMATOCRIT, BLOOD
HCT: 23.7 % — ABNORMAL LOW (ref 39.0–52.0)
HCT: 25.3 % — ABNORMAL LOW (ref 39.0–52.0)
Hemoglobin: 7.6 g/dL — ABNORMAL LOW (ref 13.0–17.0)
Hemoglobin: 8.1 g/dL — ABNORMAL LOW (ref 13.0–17.0)

## 2020-02-14 LAB — PROTIME-INR
INR: 1.4 — ABNORMAL HIGH (ref 0.8–1.2)
Prothrombin Time: 16.7 seconds — ABNORMAL HIGH (ref 11.4–15.2)

## 2020-02-14 LAB — PREPARE RBC (CROSSMATCH)

## 2020-02-14 LAB — SARS CORONAVIRUS 2 BY RT PCR (HOSPITAL ORDER, PERFORMED IN ~~LOC~~ HOSPITAL LAB): SARS Coronavirus 2: NEGATIVE

## 2020-02-14 MED ORDER — SODIUM CHLORIDE 0.9% IV SOLUTION: Freq: Once | INTRAVENOUS | Status: DC

## 2020-02-14 MED ORDER — WARFARIN SODIUM 7.5 MG PO TABS
17.5000 mg | ORAL_TABLET | Freq: Once | ORAL | Status: AC
  Administered 2020-02-14: 17.5 mg via ORAL
  Filled 2020-02-14: qty 1

## 2020-02-14 MED ORDER — WARFARIN SODIUM 5 MG PO TABS
15.0000 mg | ORAL_TABLET | Freq: Once | ORAL | Status: AC
  Administered 2020-02-14: 15 mg via ORAL
  Filled 2020-02-14: qty 3

## 2020-02-14 MED ORDER — SENNOSIDES-DOCUSATE SODIUM 8.6-50 MG PO TABS
1.0000 | ORAL_TABLET | Freq: Every day | ORAL | Status: DC
  Filled 2020-02-14: qty 1

## 2020-02-14 MED ORDER — ENOXAPARIN SODIUM 100 MG/ML ~~LOC~~ SOLN
100.0000 mg | Freq: Once | SUBCUTANEOUS | Status: AC
  Administered 2020-02-14: 100 mg via SUBCUTANEOUS
  Filled 2020-02-14: qty 1

## 2020-02-14 MED ORDER — ENOXAPARIN SODIUM 100 MG/ML ~~LOC~~ SOLN
100.0000 mg | Freq: Two times a day (BID) | SUBCUTANEOUS | Status: DC
  Administered 2020-02-14 – 2020-02-17 (×7): 100 mg via SUBCUTANEOUS
  Filled 2020-02-14 (×8): qty 1

## 2020-02-14 MED ORDER — TRAMADOL HCL 50 MG PO TABS
50.0000 mg | ORAL_TABLET | Freq: Four times a day (QID) | ORAL | Status: DC | PRN
  Administered 2020-02-14 – 2020-02-17 (×6): 50 mg via ORAL
  Filled 2020-02-14 (×6): qty 1

## 2020-02-14 MED ORDER — SIMVASTATIN 20 MG PO TABS
20.0000 mg | ORAL_TABLET | Freq: Every day | ORAL | Status: DC
  Administered 2020-02-14 – 2020-02-17 (×4): 20 mg via ORAL
  Filled 2020-02-14 (×4): qty 1

## 2020-02-14 MED ORDER — INSULIN ASPART 100 UNIT/ML ~~LOC~~ SOLN
0.0000 [IU] | Freq: Three times a day (TID) | SUBCUTANEOUS | Status: DC
  Administered 2020-02-17: 2 [IU] via SUBCUTANEOUS

## 2020-02-14 MED ORDER — ALFUZOSIN HCL ER 10 MG PO TB24
10.0000 mg | ORAL_TABLET | Freq: Every day | ORAL | Status: DC
  Administered 2020-02-14 – 2020-02-17 (×4): 10 mg via ORAL
  Filled 2020-02-14 (×4): qty 1

## 2020-02-14 MED ORDER — WARFARIN - PHARMACIST DOSING INPATIENT: Freq: Every day | Status: DC

## 2020-02-14 NOTE — Progress Notes (Signed)
Patient seen and examined personally, I reviewed the chart, history and physical and admission note, done by admitting physician this morning and agree with the same with following addendum.  Please refer to the morning admission note for more detailed plan of care.  On exam he has wife and daughter at the bedside.  They appear upset with patient's discharge yesterday reports was very dizzy and weak and deconditioned while he was trying to enter the house and needed to be brought back. They think hiss color looks better after blood transfusion. She denies any chest pain nausea vomiting fever chills.  He feels somewhat stronger today  Briefly, As per admitting 79 y.o. 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  Patient is s/p left total knee arthroplasty secondary to severe osteoarthritis on 5/21 and was just discharged to go home today.He was getting out of car and trying to walk up a curb when he felt his knee buckle from under him but did not fall. Noted slight lightheaded but no chest or shortness of breath.  Reportedly hypotensive with systolics of 0000000 on EMS arrival. Normotensive here in the ED.   No leukocytosis, Hgb of 7.4 from a prior of 11.6. N of 129, glucose of 166, creatinine normal at 0.98. INR of 1.3 currently on Lovenox bridge.   CT head and CTA chest negative.  ED physician discussed with orthopedic Dr. Ricard Dillon and they feel drop in hemoglobin is expected and has no further recommendation.   Issues: Symptomatic anemia likely suspecting acute blood loss in the setting of left knee surgery while on anticoagulation. Left knee appears swollen small amount of blood underneath the dressing.  Hemoglobin slightly better.  Will check serial  H&H.  Patient denies any black stool or any bleeding Recent Labs  Lab 02/13/20 1927 02/14/20 0815 02/14/20 1207  HGB 7.4* 7.7* 7.6*  HCT 23.0* 24.1* 23.7*   Severe osteoarthritis status post total left knee arthroplasty 5/21: I have notified Dr. Jackelyn Poling PA orthopedic surgery to come and evaluate the patient as family appears upset.  I have also requested PA to see if patient can be transferred under orthopedic surgery given immediate postop readmission, while hospitalist will continue to follow.  Severe deconditioning: Family reports patient has been deconditioned due to severe arthroplasty and has not been very mobile, in the setting of total knee arthroplasty encouraging ambulation, still deconditioned PT OT requested and will likely need a skilled nursing facility placement.  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  CTA negative for PE.  Continue Lovenox/Coumadin, pharmacy dosing.  Type 2 diabetes mellitus-continue sliding scale  PAF continue anticoagulation heart rate controlled  Hyponatremia-improving.  PT OT wound care as per surgery.  Patient continues to remain hospitalized due to ongoing severe deconditioning, acute blood loss anemia symptomatic anemia and expected length of stay at least more than 2 midnight.  He will need skilled nursing placement placement for his deconditioning.

## 2020-02-14 NOTE — ED Notes (Signed)
ED TO INPATIENT HANDOFF REPORT  Name/Age/Gender Colton Miranda 79 y.o. male  Code Status    Code Status Orders  (From admission, onward)         Start     Ordered   02/14/20 0022  Full code  Continuous     02/14/20 0022        Code Status History    Date Active Date Inactive Code Status Order ID Comments User Context   02/10/2020 0945 02/13/2020 1758 Full Code [id number]  Drue Novel, PA Inpatient   Advance Care Planning Activity    Advance Directive Documentation     Most Recent Value  Type of Advance Directive  Living will  Pre-existing out of facility DNR order (yellow form or pink MOST form)  --  "MOST" Form in Place?  --      Home/SNF/Other Home  Chief Complaint Anemia [D64.9]  Level of Care/Admitting Diagnosis ED Disposition    ED Disposition Condition [place]: [place] [100102]  Level of Care: Telemetry [5]  Admit to tele based on following criteria: Other see comments  Comments: syncope  Covid Evaluation: Asymptomatic Screening Protocol (No Symptoms)  Diagnosis: Anemia [id number]  Admitting Physician: Orene Desanctis [id number]  Attending Physician: Orene Desanctis [id number]       Medical History Past Medical History:  Diagnosis Date  . BPH associated with nocturia   . Chronic constipation   . COPD (chronic obstructive pulmonary disease) ([place])   . Eczema   . Heart murmur    at birth  . History of adenomatous polyp of colon   . History of basal cell carcinoma (BCC) of skin    post excision from trunk  . 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 20 years ago , 1999, caused by blood clot after knee scope"  DVT to PE  . History of depression    over 40 years ago nervous breakdown  . History of DVT (deep vein thrombosis)    11-06-2017 per pt and wife happened lower leg approx. 1999 after knee scope  . History of pulmonary  embolus (PE)    11-06-2017 from DVT in approx. 1999 per wife and pt  . HOH (hard of hearing)   . Hyperlipidemia   . Hypertension    followed by pcp  . Long term (current) use of anticoagulants Coumadin-- followed by Pharmasist w/ [facility]   secondary to primary hypercoagulopathy w/ hx DVT and PE  . Myalgia and myositis, unspecified   . OA (osteoarthritis)    "all joints"  . PAF (paroxysmal atrial fibrillation) ([place]) 2019  . Peripheral neuropathy   . Pinched nerve in neck    per pt causes intermittant numbness bilateral arms and hands  . Primary hypercoagulable state ([place])   . Prostate cancer [facility]) urologist-  dr Alyson Ingles  oncologist-- dr Tammi Klippel   dx 08-28-2017  Stage T1c, Gleason 4+4, PSA 14.1, vol 42.8cc-- planned treatment external radiation and ADT  . 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 Protein S deficiency ([place])   . Right bundle branch block   . Seborrheic dermatitis, unspecified   . Stroke ([place])   . Type 2 diabetes mellitus ([place])   . Urinary frequency   . Wears glasses     Allergies No Known Allergies  IV Location/Drains/Wounds Patient Lines/Drains/Airways Status   Active Line/Drains/Airways    Name:  Placement date:   Placement time:   Site:   Days:   Peripheral IV 02/13/20 Right Hand   02/13/20    --    Hand   1   Peripheral IV 02/13/20 Left Forearm   02/13/20    2133    Forearm   1   Incision (Closed) 02/10/20 Knee Left   02/10/20    1046     4          Labs/Imaging Results for orders placed or performed during the hospital encounter of 02/13/20 (from the past 48 hour(s))  CBG monitoring, ED     Status: Abnormal   Collection Time: 02/13/20  6:29 PM  Result Value Ref Range   Glucose-Capillary 127 (H) 70 - 99 mg/dL    Comment: Glucose reference range applies only to samples taken after fasting for at least 8 hours.  CBC with Differential     Status: Abnormal   Collection Time: 02/13/20  7:27 PM  Result Value Ref Range   WBC 8.9 4.0 - 10.5 K/uL    RBC 2.64 (L) 4.22 - 5.81 MIL/uL   Hemoglobin 7.4 (L) 13.0 - 17.0 g/dL   HCT 23.0 (L) 39.0 - 52.0 %   MCV 87.1 80.0 - 100.0 fL   MCH 28.0 26.0 - 34.0 pg   MCHC 32.2 30.0 - 36.0 g/dL   RDW 13.8 11.5 - 15.5 %   Platelets 196 150 - 400 K/uL   nRBC 0.0 0.0 - 0.2 %   Neutrophils Relative % 83 %   Neutro Abs 7.3 1.7 - 7.7 K/uL   Lymphocytes Relative 8 %   Lymphs Abs 0.7 0.7 - 4.0 K/uL   Monocytes Relative 8 %   Monocytes Absolute 0.7 0.1 - 1.0 K/uL   Eosinophils Relative 0 %   Eosinophils Absolute 0.0 0.0 - 0.5 K/uL   Basophils Relative 0 %   Basophils Absolute 0.0 0.0 - 0.1 K/uL   Immature Granulocytes 1 %   Abs Immature Granulocytes 0.09 (H) 0.00 - 0.07 K/uL    Comment: Performed at [facility], [place] [address], [place], [place] [address]  Comprehensive metabolic panel     Status: Abnormal   Collection Time: 02/13/20  7:27 PM  Result Value Ref Range   Sodium 129 (L) 135 - 145 mmol/L   Potassium 4.6 3.5 - 5.1 mmol/L   Chloride 97 (L) 98 - 111 mmol/L   CO2 25 22 - 32 mmol/L   Glucose, Bld 177 (H) 70 - 99 mg/dL    Comment: Glucose reference range applies only to samples taken after fasting for at least 8 hours.   BUN 25 (H) 8 - 23 mg/dL   Creatinine, Ser 0.98 0.61 - 1.24 mg/dL   Calcium 8.3 (L) 8.9 - 10.3 mg/dL   Total Protein 6.0 (L) 6.5 - 8.1 g/dL   Albumin 2.9 (L) 3.5 - 5.0 g/dL   AST 28 15 - 41 U/L   ALT 27 0 - 44 U/L   Alkaline Phosphatase 66 38 - 126 U/L   Total Bilirubin 0.7 0.3 - 1.2 mg/dL   GFR calc non Af Amer >60 >60 mL/min   GFR calc Af Amer >60 >60 mL/min   Anion gap 7 5 - 15    Comment: Performed at [facility], [place] [address], [place], [place] [address]  Protime-INR     Status: Abnormal   Collection Time: 02/13/20  7:27 PM  Result Value Ref Range  Prothrombin Time 15.5 (H) 11.4 - 15.2 seconds   INR 1.3 (H) 0.8 - 1.2    Comment: (NOTE) INR goal varies based on device and disease states. Performed at [facility], [place] [address], [place], [place] [address]   Troponin I (High Sensitivity)     Status: None   Collection Time: 02/13/20  7:27 PM  Result Value Ref Range   Troponin I (High Sensitivity) 7 <18 ng/L    Comment: (NOTE) Elevated high sensitivity troponin I (hsTnI) values and significant  changes across serial measurements may suggest ACS but many other  chronic and acute conditions are known to elevate hsTnI results.  Refer to the "Links" section for chest pain algorithms and additional  guidance. Performed at [facility], [place] [address] [place], [place] [address]   Brain natriuretic peptide     Status: None   Collection Time: 02/13/20  7:27 PM  Result Value Ref Range   B Natriuretic Peptide 66.5 0.0 - 100.0 pg/mL    Comment: Performed at [facility], [place] [address], [place], [place] [address]  Troponin I (High Sensitivity)     Status: None   Collection Time: 02/13/20  9:27 PM  Result Value Ref Range   Troponin I (High Sensitivity) 7 <18 ng/L    Comment: (NOTE) Elevated high sensitivity troponin I (hsTnI) values and significant  changes across serial measurements may suggest ACS but many other  chronic and acute conditions are known to elevate hsTnI results.  Refer to the "Links" section for chest pain algorithms and additional  guidance. Performed at [facility], [place] [address] [place], [place] [address]   Urinalysis, Routine w reflex microscopic     Status: None   Collection Time: 02/13/20 10:00 PM  Result Value Ref Range   Color, Urine YELLOW YELLOW   APPearance CLEAR CLEAR   Specific Gravity, Urine 1.026 1.005 - 1.030   pH 5.0 5.0 - 8.0   Glucose, UA NEGATIVE NEGATIVE mg/dL   Hgb urine dipstick NEGATIVE NEGATIVE   Bilirubin Urine NEGATIVE NEGATIVE   Ketones, ur NEGATIVE NEGATIVE mg/dL   Protein, ur NEGATIVE NEGATIVE mg/dL   Nitrite NEGATIVE NEGATIVE   Leukocytes,Ua NEGATIVE NEGATIVE    Comment:  Performed at [place] [address] [place], [place] [address]  SARS Coronavirus 2 by RT PCR (hospital order, performed in [facility] hospital lab) Nasopharyngeal Nasopharyngeal Swab     Status: None   Collection Time: 02/14/20 12:05 AM   Specimen: Nasopharyngeal Swab  Result Value Ref Range   SARS Coronavirus 2 NEGATIVE NEGATIVE    Comment: (NOTE) SARS-CoV-2 target nucleic acids are NOT DETECTED. The SARS-CoV-2 RNA is generally detectable in upper and lower respiratory specimens during the acute phase of infection. The lowest concentration of SARS-CoV-2 viral copies this assay can detect is 250 copies / mL. A negative result does not preclude SARS-CoV-2 infection and should not be used as the sole basis for treatment or other patient management decisions.  A negative result may occur with improper specimen collection / handling, submission of specimen other than nasopharyngeal swab, presence of viral mutation(s) within the areas targeted by this assay, and inadequate number of viral copies (<250 copies / mL). A negative result must be combined with clinical observations, patient history, and epidemiological information. Fact Sheet for Patients:   StrictlyIdeas.no Fact Sheet for Healthcare Providers: BankingDealers.co.za This test is not yet approved or cleared  by the [place]  FDA and has been authorized for detection and/or diagnosis of SARS-CoV-2 by FDA under an Emergency Use Authorization (EUA).  This EUA will remain in effect (meaning this test can be used) for the duration of the COVID-19 declaration under Section 564(b)(1) of the Act, 21 U.S.C. section 360bbb-3(b)(1), unless the authorization is terminated or revoked sooner. Performed at [facility], [place] [address] [place], [place] [address]   Type and screen [place]     Status: None (Preliminary result)    Collection Time: 02/14/20  1:01 AM  Result Value Ref Range   ABO/RH(D) O POS    Antibody Screen NEG    Sample Expiration 02/17/2020,2359    Unit Number [id number]    Blood Component Type RED CELLS,LR    Unit division 00    Status of Unit ALLOCATED    Transfusion Status OK TO TRANSFUSE    Crossmatch Result      Compatible Performed at [facility], [place] [address] [place], [place] [address]   Prepare RBC (crossmatch)     Status: None   Collection Time: 02/14/20  1:01 AM  Result Value Ref Range   Order Confirmation      ORDER PROCESSED BY BLOOD BANK Performed at [facility] [address], [place],  [address]    DG Chest 2 View  Result Date: 02/13/2020 CLINICAL DATA:  Syncope. EXAM: CHEST - 2 VIEW COMPARISON:  December 02, 2016. FINDINGS: The heart size and mediastinal contours are within normal limits. Both lungs are clear. No pneumothorax or pleural effusion is noted. The visualized skeletal structures are unremarkable. IMPRESSION: No active cardiopulmonary disease. Electronically Signed   By: Marijo Conception M.D.   On: 02/13/2020 20:00   CT Head Wo Contrast  Result Date: 02/13/2020 CLINICAL DATA:  Neuro deficit, acute, stroke suspected lightheadedness, syncope, hx stroke, concern for head bleed or recurrent stroke Discharged earlier today after knee replacement. Patient reports a near syncopal episode getting out of car. EXAM: CT HEAD WITHOUT CONTRAST TECHNIQUE: Contiguous axial images were obtained from the base of the skull through the vertex without intravenous contrast. COMPARISON:  Head CT 06/21/2019, brain MRI 06/22/2019 FINDINGS: Brain: No evidence of acute infarction, hemorrhage, hydrocephalus, extra-axial collection or mass lesion/mass effect. Stable degree of generalized atrophy and chronic small vessel ischemia. Unchanged remote left parietal infarct. Vascular: Atherosclerosis of skullbase vasculature without hyperdense vessel or  abnormal calcification. Skull: No fracture or focal lesion. Sinuses/Orbits: Minor mucosal thickening of ethmoid air cells. No sinus fluid levels. Orbits are unremarkable. Other: None. IMPRESSION: 1. No acute intracranial abnormality. 2. Stable atrophy, chronic small vessel ischemia, and remote left parietal infarct. Electronically Signed   By: Keith Rake M.D.   On: 02/13/2020 22:07   CT Angio Chest PE W and/or Wo Contrast  Result Date: 02/13/2020 CLINICAL DATA:  Syncopal episode. EXAM: CT ANGIOGRAPHY CHEST WITH CONTRAST TECHNIQUE: Multidetector CT imaging of the chest was performed using the standard protocol during bolus administration of intravenous contrast. Multiplanar CT image reconstructions and MIPs were obtained to evaluate the vascular anatomy. CONTRAST:  127mL OMNIPAQUE IOHEXOL 350 MG/ML SOLN COMPARISON:  None. FINDINGS: Cardiovascular: There is moderate to marked severity calcification of the aortic arch. Satisfactory opacification of the pulmonary arteries to the segmental level. No evidence of pulmonary embolism. Normal heart size. No pericardial effusion. Moderate severity coronary artery calcification is seen. Mediastinum/Nodes: No enlarged mediastinal, hilar, or axillary lymph nodes. Thyroid gland, trachea, and esophagus demonstrate no significant  findings. Lungs/Pleura: Lungs are clear. No pleural effusion or pneumothorax. Upper Abdomen: There is a small hiatal hernia. Musculoskeletal: Multilevel degenerative changes seen throughout the thoracic spine. Review of the MIP images confirms the above findings. IMPRESSION: 1. No evidence of pulmonary embolus. 2. Moderate to marked severity calcification of the aortic arch. 3. Moderate severity coronary artery calcification. 4. Small hiatal hernia. 5. Multilevel degenerative changes throughout the thoracic spine. Aortic Atherosclerosis (ICD10-I70.0). Electronically Signed   By: Virgina Norfolk M.D.   On: 02/13/2020 22:13    Pending  Labs Unresulted Labs (From admission, onward)    Start     Ordered   02/14/20 [id number]  Basic metabolic panel  Tomorrow morning,   R     02/14/20 0022   02/14/20 0500  CBC  Tomorrow morning,   R     02/14/20 0022   02/14/20 0500  Protime-INR  Daily,   R     02/14/20 0136          Vitals/Pain Today's Vitals   02/13/20 2356 02/14/20 0200 02/14/20 0215 02/14/20 0230  BP: (!) 126/55 (!) 121/50 (!) 120/51 116/79  Pulse: 87 74 62 66  Resp: 20 (!) 23 (!) 21 17  Temp:      TempSrc:      SpO2: 97% 96% 94% 97%  Weight:      Height:      PainSc:        Isolation Precautions No active isolations  Medications Medications  0.9 %  sodium chloride infusion (Manually program via Guardrails IV Fluids) (has no administration in time range)  Warfarin - Pharmacist Dosing Inpatient (has no administration in time range)  traMADol (ULTRAM) tablet 50-100 mg (has no administration in time range)  simvastatin (ZOCOR) tablet 20 mg (has no administration in time range)  senna-docusate (Senokot-S) tablet 1 tablet (1 tablet Oral Refused 02/14/20 0221)  alfuzosin (UROXATRAL) 24 hr tablet 10 mg (has no administration in time range)  insulin aspart (novoLOG) injection 0-9 Units (has no administration in time range)  enoxaparin (LOVENOX) injection 100 mg (has no administration in time range)  iohexol (OMNIPAQUE) 350 MG/ML injection 100 mL (100 mLs Intravenous Contrast Given 02/13/20 2145)  warfarin (COUMADIN) tablet 17.5 mg (17.5 mg Oral Given 02/14/20 0220)  enoxaparin (LOVENOX) injection 100 mg (100 mg Subcutaneous Given 02/14/20 0221)    Mobility walks with device

## 2020-02-14 NOTE — Consult Note (Signed)
WOC consulted for wound care; patient with recent orthopedic surgery 02/10/20.  Orthopedics would guide any post op surgical care. I have sent a [place] to Dr. Theda Sers to verify any needed care and will update orders accordingly.    Re consult if needed, will not follow at this time. Thanks  Ninamarie Keel [employer], RN,CWOCN, CNS, [place] [phone])

## 2020-02-14 NOTE — Plan of Care (Signed)
  Problem: Education: Goal: Knowledge of condition and prescribed therapy will improve Outcome: Progressing   Problem: Cardiac: Goal: Will achieve and/or maintain adequate cardiac output Outcome: Progressing   Problem: Physical Regulation: Goal: Complications related to the disease process, condition or treatment will be avoided or minimized Outcome: Progressing   

## 2020-02-14 NOTE — H&P (Signed)
History and Physical    Colton Miranda CBS:[id number] DOB: 1941-06-17 DOA: 02/13/2020  PCP: Marton Redwood, MD  Patient coming from: Home  I have personally briefly reviewed patient's old medical records in [place]  Chief Complaint: weakness   HPI: Colton Miranda is a 79 y.o. 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  Patient is s/p left total knee arthroplasty secondary to severe osteoarthritis on 5/21 and was just discharged to go home today.He was getting out of car and trying to walk up a curb when he felt his knee buckle from under him but did not fall. Noted slight lightheaded but no chest or shortness of breath.  Reportedly hypotensive with systolics of 84Y on EMS arrival. Normotensive here in the ED.   No leukocytosis, Hgb of 7.4 from a prior of 11.6. N of 129, glucose of 166, creatinine normal at 0.98. INR of 1.3 currently on Lovenox bridge.   CT head and CTA chest negative.  ED physician discussed with orthopedic Dr. Ricard Dillon and they feel drop in hemoglobin is expected and has no further recommendation.    Review of Systems:  Constitutional: No Weight Change, No Fever ENT/Mouth: No sore throat, No Rhinorrhea Eyes: No Eye Pain, No Vision Changes Cardiovascular: No Chest Pain, no SOB Respiratory: No Cough, No Sputum, No Wheezing, no Dyspnea  Gastrointestinal: No Nausea, No Vomiting, No Diarrhea, No Constipation, No Pain Genitourinary: no Urinary Incontinence Musculoskeletal: No Arthralgias, No Myalgias Skin: No Skin Lesions, No Pruritus, Neuro: + Weakness, No Numbness,  No Loss of Consciousness, +lightheadedness Psych: No Anxiety/Panic, No Depression, no decrease appetite Heme/Lymph: No Bruising, No  Bleeding Past Medical History:  Diagnosis Date  . BPH associated with nocturia   . Chronic constipation   . COPD (chronic obstructive pulmonary disease) ([place])   . Eczema   . Heart murmur    at birth  . History of adenomatous polyp of colon   . History of basal cell carcinoma (BCC) of skin    post excision from trunk  . 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 20 years ago , 1999, caused by blood clot after knee scope"  DVT to PE  . History of depression    over 40 years ago nervous breakdown  . History of DVT (deep vein thrombosis)    11-06-2017 per pt and wife happened lower leg approx. 1999 after knee scope  . History of pulmonary embolus (PE)    11-06-2017 from DVT in approx. 1999 per wife and pt  . HOH (hard of hearing)   . Hyperlipidemia   . Hypertension    followed by pcp  . Long term (current) use of anticoagulants Coumadin-- followed by Pharmasist w/ [facility]   secondary to primary hypercoagulopathy w/ hx DVT and PE  . Myalgia and myositis, unspecified   . OA (osteoarthritis)    "all joints"  . PAF (paroxysmal atrial fibrillation) ([place]) 2019  . Peripheral neuropathy   . Pinched nerve in neck    per pt causes intermittant numbness bilateral arms and hands  . Primary hypercoagulable state ([place])   . Prostate cancer [facility]) urologist-  dr Alyson Ingles  oncologist-- dr Tammi Klippel   dx 08-28-2017  Stage T1c, Gleason 4+4, PSA 14.1, vol 42.8cc-- planned  treatment external radiation and ADT  . 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 Protein S deficiency ([place])   . Right bundle branch block   . Seborrheic dermatitis, unspecified   . Stroke ([place])   . Type 2 diabetes mellitus ([place])   . Urinary frequency   . Wears glasses     Past Surgical History:  Procedure Laterality Date  . CHOLECYSTECTOMY OPEN  1981  . COLONOSCOPY  last one 01-28-2017  . GOLD SEED IMPLANT N/A 11/16/2017   Procedure: GOLD SEED  IMPLANT;  Surgeon: Cleon Gustin, MD;  Location: [facility];  Service: Urology;  Laterality: N/A;  . KNEE ARTHROSCOPY Right 1990s  . PILONIDAL CYST EXCISION  1968  . PROSTATE BIOPSY  09/10/2017    (done in office)   + Pos for Prostate Cancer, Dr.MCkinzie( [facility])   . SPACE OAR INSTILLATION N/A 11/16/2017   Procedure: SPACE OAR INSTILLATION;  Surgeon: Cleon Gustin, MD;  Location: [facility];  Service: Urology;  Laterality: N/A;  . TOTAL HIP ARTHROPLASTY Right 06-18-2009   dr Wynelle Link  [facility]  . TOTAL KNEE ARTHROPLASTY Left 02/10/2020   Procedure: TOTAL KNEE ARTHROPLASTY;  Surgeon: Sydnee Cabal, MD;  Location: [facility];  Service: Orthopedics;  Laterality: Left;  adductor canale     reports that he quit smoking about 11 years ago. His smoking use included cigarettes. He quit after 50.00 years of use. He has never used smokeless tobacco. He reports current alcohol use of about 3.0 standard drinks of alcohol per week. He reports that he does not use drugs.  No Known Allergies  Family History  Problem Relation Age of Onset  . Cancer Mother 14       colon , breast  . Colon cancer Mother   . Heart attack Daughter        2008  . Parkinson's disease Sister   . Cancer Sister        breast  . Cancer Maternal Uncle        melanoma  . Cancer Paternal Uncle      Prior to Admission medications   Medication Sig Start Date End Date Taking? Authorizing Provider  acetaminophen (TYLENOL) 500 MG tablet Take 1,000 mg by mouth every 6 (six) hours as needed for moderate pain.    [provider]  alfuzosin (UROXATRAL) 10 MG 24 hr tablet Take 1 tablet (10 mg total) by mouth daily with breakfast. 07/16/18   Bruning, Ashlyn, PA-C  Blood Glucose Monitoring Suppl (ONETOUCH VERIO) w/Device KIT by Does not apply route. Check blood sugar twice daily as directed DX E11.65    [provider]  Cholecalciferol (VITAMIN D3) 50 MCG (2000 UT) TABS Take  2,000 Units by mouth daily.     [provider]  diclofenac Sodium (VOLTAREN) 1 % GEL Apply 1 application topically at bedtime.    [provider]  diphenhydrAMINE (SOMINEX) 25 MG tablet Take 25 mg by mouth at bedtime as needed for sleep.    [provider]  enoxaparin (LOVENOX) 100 MG/ML injection Inject 100 mg into the skin every 12 (twelve) hours.    [provider]  lisinopril (PRINIVIL,ZESTRIL) 20 MG tablet Take 1 tablet (20 mg total) by mouth daily. 03/30/18   Lauree Chandler, NP  metFORMIN (GLUCOPHAGE) 500 MG tablet TAKE 1 TABLET BY MOUTH ONCE DAILY WITH BREAKFAST Patient taking differently: Take 500 mg by mouth daily with breakfast.  01/25/18   Lauree Chandler, NP  methocarbamol (ROBAXIN) 500 MG tablet Take 1 tablet (500 mg total) by mouth 4 (four) times daily. 02/10/20   Drue Novel, PA  nystatin cream (MYCOSTATIN) Apply 1 application topically daily as needed (yeast).    [provider]  ondansetron (ZOFRAN) 4 MG tablet Take 1 tablet (4 mg total) by mouth daily as needed for nausea or vomiting. 02/10/20 02/09/21  Haus, Cordelia Pen, PA  ONETOUCH DELICA LANCETS 60A MISC USE 1  TO CHECK GLUCOSE TWICE DAILY Patient taking differently: 2 (two) times daily.  03/02/18   Lauree Chandler, NP  ONETOUCH VERIO test strip USE 1 STRIP TO CHECK GLUCOSE TWICE DAILY AS DIRECTED Patient taking differently: 2 (two) times daily.  03/02/18   Lauree Chandler, NP  OVER THE COUNTER MEDICATION Apply 1 application topically every other day. Foot miracle cream - apply every other night    [provider]  oxyCODONE (ROXICODONE) 5 MG immediate release tablet Take 1 tablet (5 mg total) by mouth every 4 (four) hours as needed for up to 7 days for severe pain. 02/10/20 02/17/20  Haus, Margarita Mail R, PA  senna-docusate (SENOKOT-S) 8.6-50 MG tablet Take 1 tablet by mouth at bedtime.    [provider]  simvastatin (ZOCOR) 20 MG tablet TAKE 1 TABLET BY MOUTH  ONCE DAILY FOR CHOLESTEROL Patient taking differently: Take 20 mg by mouth daily.  03/02/18   Lauree Chandler, NP  tolnaftate (TINACTIN) 1 % cream Apply 1 application topically every other day. At night    [provider]  traMADol (ULTRAM) 50 MG tablet Take one to two tablets by mouth every 6 hours as needed for pain. Do not take more than 6 tablets in 24 hours Patient taking differently: Take 50-100 mg by mouth every 6 (six) hours as needed for moderate pain (for pain- not to exceed 6 tablets in 24 hours).  03/15/18   Lauree Chandler, NP  warfarin (COUMADIN) 10 MG tablet TAKE 1 TABLET BY MOUTH ONCE DAILY ALONG  WITH  A  5  MG  TABLET  FOR  A  TOTAL  OF  15  MG  DAILY Patient taking differently: Take 10 mg by mouth See admin instructions. Take 10 mg with the 7.5 mg to equal 17.5 mg after supper on Sun/Mon/Wed/Fri/Sat and 10 mg with the 5 mg to equal 15 mg after supper on Tues/Thurs 04/14/18   Hendricks Limes, MD  warfarin (COUMADIN) 5 MG tablet Take 5-7.5 mg by mouth See admin instructions. Take 7.5 mg with the 10 mg to equal 17.5 mg after supper on Sun/Mon/Wed/Fri/Sat and 5 mg with the 10 mg to equal 15 mg after supper on Tues/Thurs    [provider]    Physical Exam: Vitals:   02/13/20 2019 02/13/20 2133 02/13/20 2245 02/13/20 2356  BP: 131/68 (!) 119/55  (!) 126/55  Pulse: 80 78 87 87  Resp: (!) 22 (!) 21 (!) 21 20  Temp:      TempSrc:      SpO2: 96% 98% 98% 97%  Weight:      Height:        Constitutional: NAD, calm, comfortable, elderly male laying flat in bed Vitals:   02/13/20 2019 02/13/20 2133 02/13/20 2245 02/13/20 2356  BP: 131/68 (!) 119/55  (!) 126/55  Pulse: 80 78 87 87  Resp: (!) 22 (!) 21 (!) 21 20  Temp:      TempSrc:      SpO2: 96% 98% 98% 97%  Weight:      Height:       Eyes: PERRL, lids and conjunctivae normal ENMT: Mucous membranes are moist.  Neck: normal, supple Respiratory: clear to auscultation bilaterally, no wheezing, no  crackles. Normal respiratory effort on room air. No accessory muscle use.  Cardiovascular: Regular rate and rhythm, no murmurs / rubs / gallops. No extremity edema. 2+ pedal pulses.  Abdomen: no tenderness, no masses palpated. Bowel sounds positive.  Musculoskeletal: no clubbing / cyanosis. No joint deformity upper and lower extremities.  no contractures. Normal muscle tone. Incision wound with clean bandage on left anterior knee.  Skin: no rashes, lesions, ulcers. No induration Neurologic: CN 2-12 grossly intact. Sensation intact, DTR normal. Strength 4/5 in lower extremity Psychiatric: Normal judgment and insight. Alert and oriented x 3. Normal mood.     Labs on Admission: I have personally reviewed following labs and imaging studies  CBC: Recent Labs  Lab 02/13/20 1927  WBC 8.9  NEUTROABS 7.3  HGB 7.4*  HCT 23.0*  MCV 87.1  PLT 975   Basic Metabolic Panel: Recent Labs  Lab 02/13/20 1927  NA 129*  K 4.6  CL 97*  CO2 25  GLUCOSE 177*  BUN 25*  CREATININE 0.98  CALCIUM 8.3*   GFR: Estimated Creatinine Clearance: 68.5 mL/min (by C-G formula based on SCr of 0.98 mg/dL). Liver Function Tests: Recent Labs  Lab 02/13/20 1927  AST 28  ALT 27  ALKPHOS 66  BILITOT 0.7  PROT 6.0*  ALBUMIN 2.9*   No results for input(s): LIPASE, AMYLASE in the last 168 hours. No results for input(s): AMMONIA in the last 168 hours. Coagulation Profile: Recent Labs  Lab 02/11/20 0316 02/12/20 0259 02/13/20 0319 02/13/20 1927  INR 1.2 1.1 1.3* 1.3*   Cardiac Enzymes: No results for input(s): CKTOTAL, CKMB, CKMBINDEX, TROPONINI in the last 168 hours. BNP (last 3 results) No results for input(s): PROBNP in the last 8760 hours. HbA1C: No results for input(s): HGBA1C in the last 72 hours. CBG: Recent Labs  Lab 02/10/20 0804 02/10/20 1239 02/13/20 1829  GLUCAP 109* 160* 127*   Lipid Profile: No results for input(s): CHOL, HDL, LDLCALC, TRIG, CHOLHDL, LDLDIRECT in the last 72  hours. Thyroid Function Tests: No results for input(s): TSH, T4TOTAL, FREET4, T3FREE, THYROIDAB in the last 72 hours. Anemia Panel: No results for input(s): VITAMINB12, FOLATE, FERRITIN, TIBC, IRON, RETICCTPCT in the last 72 hours. Urine analysis:    Component Value Date/Time   COLORURINE YELLOW 02/13/2020 2200   APPEARANCEUR CLEAR 02/13/2020 2200   LABSPEC 1.026 02/13/2020 2200   PHURINE 5.0 02/13/2020 2200   GLUCOSEU NEGATIVE 02/13/2020 2200   HGBUR NEGATIVE 02/13/2020 2200   BILIRUBINUR NEGATIVE 02/13/2020 2200   KETONESUR NEGATIVE 02/13/2020 2200   PROTEINUR NEGATIVE 02/13/2020 2200   UROBILINOGEN 0.2 06/07/2009 0815   NITRITE NEGATIVE 02/13/2020 2200   LEUKOCYTESUR NEGATIVE 02/13/2020 2200    Radiological Exams on Admission: DG Chest 2 View  Result Date: 02/13/2020 CLINICAL DATA:  Syncope. EXAM: CHEST - 2 VIEW COMPARISON:  December 02, 2016. FINDINGS: The heart size and mediastinal contours are within normal limits. Both lungs are clear. No pneumothorax or pleural effusion is noted. The visualized skeletal structures are unremarkable. IMPRESSION: No active cardiopulmonary disease. Electronically Signed   By: Marijo Conception M.D.   On: 02/13/2020 20:00   CT Head Wo Contrast  Result Date: 02/13/2020 CLINICAL DATA:  Neuro deficit, acute, stroke suspected lightheadedness, syncope, hx stroke, concern for head bleed or recurrent stroke  Discharged earlier today after knee replacement. Patient reports a near syncopal episode getting out of car. EXAM: CT HEAD WITHOUT CONTRAST TECHNIQUE: Contiguous axial images were obtained from the base of the skull through the vertex without intravenous contrast. COMPARISON:  Head CT 06/21/2019, brain MRI 06/22/2019 FINDINGS: Brain: No evidence of acute infarction, hemorrhage, hydrocephalus, extra-axial collection or mass lesion/mass effect. Stable degree of generalized atrophy and chronic small vessel ischemia. Unchanged remote left parietal infarct.  Vascular: Atherosclerosis of skullbase vasculature without hyperdense vessel or abnormal calcification. Skull: No fracture or focal lesion. Sinuses/Orbits: Minor mucosal thickening of ethmoid air cells. No sinus fluid levels. Orbits are unremarkable. Other: None. IMPRESSION: 1. No acute intracranial abnormality. 2. Stable atrophy, chronic small vessel ischemia, and remote left parietal infarct. Electronically Signed   By: Keith Rake M.D.   On: 02/13/2020 22:07   CT Angio Chest PE W and/or Wo Contrast  Result Date: 02/13/2020 CLINICAL DATA:  Syncopal episode. EXAM: CT ANGIOGRAPHY CHEST WITH CONTRAST TECHNIQUE: Multidetector CT imaging of the chest was performed using the standard protocol during bolus administration of intravenous contrast. Multiplanar CT image reconstructions and MIPs were obtained to evaluate the vascular anatomy. CONTRAST:  152m OMNIPAQUE IOHEXOL 350 MG/ML SOLN COMPARISON:  None. FINDINGS: Cardiovascular: There is moderate to marked severity calcification of the aortic arch. Satisfactory opacification of the pulmonary arteries to the segmental level. No evidence of pulmonary embolism. Normal heart size. No pericardial effusion. Moderate severity coronary artery calcification is seen. Mediastinum/Nodes: No enlarged mediastinal, hilar, or axillary lymph nodes. Thyroid gland, trachea, and esophagus demonstrate no significant findings. Lungs/Pleura: Lungs are clear. No pleural effusion or pneumothorax. Upper Abdomen: There is a small hiatal hernia. Musculoskeletal: Multilevel degenerative changes seen throughout the thoracic spine. Review of the MIP images confirms the above findings. IMPRESSION: 1. No evidence of pulmonary embolus. 2. Moderate to marked severity calcification of the aortic arch. 3. Moderate severity coronary artery calcification. 4. Small hiatal hernia. 5. Multilevel degenerative changes throughout the thoracic spine. Aortic Atherosclerosis (ICD10-I70.0). Electronically  Signed   By: TVirgina NorfolkM.D.   On: 02/13/2020 22:13      Assessment/Plan Post-operative symptomatic anemia Will transfuse 1u pRBC and repeat CBC Keep on telemetry  Hyponatremia Likely hypovolemic. Follow after transfusion.  Hypercoagulable state with protein C and S deficiency/hx of PE/DVT CTA chest negative for PE On Lovenox bridge back to warfarin - will continue and follow anemia closely  Left knee osteoarthritis s/p left total knee arthroplasty Surgery on 5/21 Wound care  Continue PT  Parosymal atrial fibrillation On Lovenox bridge as above  Type 2 diabetes  Sensitive sliding scale  Status is: Observation  The patient remains OBS appropriate and will d/c before 2 midnights.  Dispo: The patient is from: Home              Anticipated d/c is to: Home              Anticipated d/c date is: 1 day              Patient currently is not medically stable to d/c.         COrene DesanctisDO [facility]   If 7PM-7AM, please contact night-coverage [email or web address]   02/14/2020, 1:05 AM

## 2020-02-14 NOTE — Progress Notes (Signed)
Subjective: Patient    Patient reports feeling better today after 1 unit of packed red blood cells and volume.  He still does not have as much energy as usual but does feel better than yesterday evening.  No chest pain or shortness of breath.  Pain is reasonable.  Objective: Vital signs in last 24 hours: Temp:  [98.2 F (36.8 C)-98.6 F (37 C)] 98.4 F (36.9 C) (05/25 1504) Pulse Rate:  [62-87] 68 (05/25 1504) Resp:  [16-23] 16 (05/25 1504) BP: (116-161)/(50-79) 124/54 (05/25 1504) SpO2:  [94 %-98 %] 97 % (05/25 1504) Weight:  [106.1 kg-112.5 kg] 112.5 kg (05/25 0325)  Intake/Output from previous day: 05/24 0701 - 05/25 0700 In: 474 [I.V.:150; Blood:324] Out: 350 [Urine:350] Intake/Output this shift: Total I/O In: 120 [P.O.:120] Out: 300 [Urine:300]  Recent Labs    02/13/20 1927 02/14/20 0815 02/14/20 1207  HGB 7.4* 7.7* 7.6*   Recent Labs    02/13/20 1927 02/13/20 1927 02/14/20 0815 02/14/20 1207  WBC 8.9  --  7.5  --   RBC 2.64*  --  2.75*  --   HCT 23.0*   < > 24.1* 23.7*  PLT 196  --  202  --    < > = values in this interval not displayed.   Recent Labs    02/13/20 1927 02/14/20 0815  NA 129* 133*  K 4.6 4.3  CL 97* 99  CO2 25 25  BUN 25* 19  CREATININE 0.98 0.84  GLUCOSE 177* 103*  CALCIUM 8.3* 8.3*   Recent Labs    02/13/20 1927 02/14/20 0815  INR 1.3* 1.4*    Left knee lower extremity swelling average for knee arthroplasty no evidence of infection nor DVT.  Foot and ankle circulation intact.   Assessment/Plan:    Appreciate hospitalist assistance and help.  Events of yesterday evening discussed with patient wife and daughter.  Has received 1 unit packed red blood cells hemoglobin at 7.6.  Would consider another unit if ordered and confirmed by his medical team.  I think it would help with strength energy and ambulation.  Hopefully over the next couple days he can show improvement.  If not, more assistance may be necessary.  We will continue  to follow him in the hospital with you while he is here.  Thanks for excellent help please call for any questions or concerns.  If his strength returns and physical therapy deems him appropriate that he can be released and follow the same instructions as from discharge instructions yesterday.  All questions encouraged and answered with patient wife and daughter to their liking.   Colton Miranda,Colton Miranda 02/14/2020, 3:16 PM

## 2020-02-14 NOTE — Progress Notes (Signed)
[place] for Warfarin and Lovenox Indication: history of PE and PAF  No Known Allergies  Patient Measurements: Height: 5\' 4"  (162.6 cm) Weight: 106.1 kg (234 lb) IBW/kg (Calculated) : 59.2  Vital Signs: Temp: 98.6 F (37 C) (05/24 1812) Temp Source: Oral (05/24 1812) BP: 126/55 (05/24 2356) Pulse Rate: 87 (05/24 2356)  Labs: Recent Labs    02/12/20 0259 02/13/20 0319 02/13/20 1927 02/13/20 2127  HGB  --   --  7.4*  --   HCT  --   --  23.0*  --   PLT  --   --  196  --   LABPROT 13.9 15.6* 15.5*  --   INR 1.1 1.3* 1.3*  --   CREATININE  --   --  0.98  --   TROPONINIHS  --   --  7 7    Estimated Creatinine Clearance: 68.5 mL/min (by C-G formula based on SCr of 0.98 mg/dL).   Medical History: Past Medical History:  Diagnosis Date  . BPH associated with nocturia   . Chronic constipation   . COPD (chronic obstructive pulmonary disease) ([place])   . Eczema   . Heart murmur    at birth  . History of adenomatous polyp of colon   . History of basal cell carcinoma (BCC) of skin    post excision from trunk  . 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 20 years ago , 1999, caused by blood clot after knee scope"  DVT to PE  . History of depression    over 40 years ago nervous breakdown  . History of DVT (deep vein thrombosis)    11-06-2017 per pt and wife happened lower leg approx. 1999 after knee scope  . History of pulmonary embolus (PE)    11-06-2017 from DVT in approx. 1999 per wife and pt  . HOH (hard of hearing)   . Hyperlipidemia   . Hypertension    followed by pcp  . Long term (current) use of anticoagulants Coumadin-- followed by Pharmasist w/ [facility]   secondary to primary hypercoagulopathy w/ hx DVT and PE  . Myalgia and myositis, unspecified   . OA (osteoarthritis)    "all joints"  . PAF (paroxysmal atrial fibrillation) ([place]) 2019   . Peripheral neuropathy   . Pinched nerve in neck    per pt causes intermittant numbness bilateral arms and hands  . Primary hypercoagulable state ([place])   . Prostate cancer [facility]) urologist-  dr Alyson Ingles  oncologist-- dr Tammi Klippel   dx 08-28-2017  Stage T1c, Gleason 4+4, PSA 14.1, vol 42.8cc-- planned treatment external radiation and ADT  . 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 Protein S deficiency ([place])   . Right bundle branch block   . Seborrheic dermatitis, unspecified   . Stroke ([place])   . Type 2 diabetes mellitus ([place])   . Urinary frequency   . Wears glasses     Medications:  Warfarin 17.5 mg daily except 15 mg daily on Tuesday/Thursday Lovenox 100 mg q 12 h bridging  Assessment: 79 y/o M with a h/o PE/DVT, PAF, hypercoagulability with protein C and S deficiency who is  s/p TKA 5/21. Patient was discharged 5/24 but had to call EMS as his knee buckled after getting out of car at his house.   Last dose of Lovenox was 5/24 AM Last dose of Coumadin was 5/23 PM  Of  note, hemoglobin was 7.4 on 5/24 (from 11.6 5/12), expected per Dr. Ricard Dillon ortho  Last INR 1.3 (5/24)  Goal of Therapy:  INR 2-3 Monitor platelets by anticoagulation protocol: Yes   Plan:  Warfarin 17.5 mg once Continue Lovenox 100 mg q 12 hours Daily PT/INR  Ulice Dash D 02/14/2020,1:15 AM

## 2020-02-14 NOTE — Evaluation (Signed)
Physical Therapy Evaluation Patient Details Name: Colton Miranda MRN: [id number] DOB: [birth year] Today's Date: 02/14/2020   History of Present Illness  Patient is 79 y.o. male s/p Lt TKA on 02/10/20 with PMH significant for DM, Stroke, protate cancer, PAF, OA, HTN, HLD, HOH, hx of DVT and PE, COPD, Rt THA In 2010. [place] 5/24 post TKA, knees buckled getting up a curb.Reportedly hypotensive with systolics of 0000000 on EMS arrival. Normotensive in the ED. HGB 7.4, received 1 unit of blood.  Clinical Impression  The patient returns to ED after DC post Left TKA after near fall when knee buckled attempting to get up a curb. Patient has bruising and noted drainage about the left knee, dressing intact Assisted patient to stand from [facility], Attempted to take a step forward with noted decreased WB tolerated, was able to take a few small steps backward with right  Leg. .  Patient's HGB post 1 unit 7.7. Per Dr. Theda Sers, will see how patient progresses for safe DC home, up the 3  1 steps. Pt admitted with above diagnosis.  Pt currently with functional limitations due to the deficits listed below (see PT Problem List). Pt will benefit from skilled PT to increase their independence and safety with mobility to allow discharge to the venue listed below.     Follow Up Recommendations Home health PT;SNF(depends on progress)    Equipment Recommendations  None recommended by PT    Recommendations for Other Services   OT   Precautions / Restrictions Precautions Precautions: Knee;Fall Precaution Comments: monitor BP, HGB low Restrictions LLE Weight Bearing: Weight bearing as tolerated      Mobility  Bed Mobility               General bed mobility comments: on BSC  Transfers Overall transfer level: Needs assistance Equipment used: Rolling walker (2 wheeled) Transfers: Sit to/from Stand Sit to Stand: Min assist Stand pivot transfers: +2 safety/equipment       General transfer comment: increased time   from [facility], Left knee buckling, Cues for hand placement  Ambulation/Gait                Stairs            Wheelchair Mobility    Modified Rankin (Stroke Patients Only)       Balance Overall balance assessment: Needs assistance Sitting-balance support: Bilateral upper extremity supported Sitting balance-Leahy Scale: Good     Standing balance support: Bilateral upper extremity supported Standing balance-Leahy Scale: Poor                               Pertinent Vitals/Pain Pain Score: 3  Pain Location: Lt knee Pain Descriptors / Indicators: Aching;Sore;Operative site guarding;Tender Pain Intervention(s): Monitored during session;Ice applied    Home Living Family/patient expects to be discharged to:: Private residence Living Arrangements: Spouse/significant other Available Help at Discharge: Family Type of Home: House Home Access: Stairs to enter Entrance Stairs-Rails: None Entrance Stairs-Number of Steps: 1+1+1 Home Layout: One level Home Equipment: Environmental consultant - 2 wheels;Cane - single point;Tub bench;Hospital bed      Prior Function                 Hand Dominance        Extremity/Trunk Assessment   Upper Extremity Assessment Upper Extremity Assessment: Overall WFL for tasks assessed    Lower Extremity Assessment LLE Deficits / Details: performed quad sset, LAQ  Cervical / Trunk Assessment Cervical / Trunk Assessment: Kyphotic  Communication      Cognition Arousal/Alertness: Awake/alert Behavior During Therapy: WFL for tasks assessed/performed Overall Cognitive Status: Within Functional Limits for tasks assessed                                        General Comments      Exercises Total Joint Exercises Ankle Circles/Pumps: AROM;Strengthening;Both;Supine;15 reps Quad Sets: AROM;Strengthening;Left;10 reps;Supine;Both Long Arc Quad: AROM;Strengthening;Left;10 reps;Seated   Assessment/Plan    PT  Assessment Patient needs continued PT services  PT Problem List Decreased strength;Decreased range of motion;Decreased activity tolerance;Decreased balance;Decreased coordination;Decreased knowledge of use of DME;Decreased knowledge of precautions;Pain       PT Treatment Interventions DME instruction;Gait training;Stair training;Functional mobility training;Therapeutic activities;Therapeutic exercise;Balance training;Patient/family education    PT Goals (Current goals can be found in the Care Plan section)  Acute Rehab PT Goals Patient Stated Goal: get back home safely PT Goal Formulation: With patient/family Time For Goal Achievement: 02/21/20 Potential to Achieve Goals: Good    Frequency 7X/week   Barriers to discharge        Co-evaluation               AM-PAC PT "6 Clicks" Mobility  Outcome Measure Help needed turning from your back to your side while in a flat bed without using bedrails?: A Little Help needed moving from lying on your back to sitting on the side of a flat bed without using bedrails?: A Little Help needed moving to and from a bed to a chair (including a wheelchair)?: A Lot Help needed standing up from a chair using your arms (e.g., wheelchair or bedside chair)?: A Lot Help needed to walk in hospital room?: A Lot Help needed climbing 3-5 steps with a railing? : Total 6 Click Score: 13    End of Session   Activity Tolerance: Patient limited by fatigue Patient left: in chair;with call bell/phone within reach;with chair alarm set Nurse Communication: Mobility status PT Visit Diagnosis: Muscle weakness (generalized) (M62.81);Difficulty in walking, not elsewhere classified (R26.2);History of falling (Z91.81)    Time: [id number] PT Time Calculation (min) (ACUTE ONLY): 25 min   Charges:   PT Evaluation $PT Eval Low Complexity: 1 Low PT Treatments $Therapeutic Activity: 8-22 mins        Tresa Endo PT Acute Rehabilitation Services Pager  [phone] Office [phone]   Claretha Cooper 02/14/2020, 3:26 PM

## 2020-02-14 NOTE — Progress Notes (Signed)
[place] for Warfarin and Lovenox Indication: history of PE and PAF  No Known Allergies  Patient Measurements: Height: 5\' 4"  (162.6 cm) Weight: 112.5 kg (248 lb 0.3 oz) IBW/kg (Calculated) : 59.2  Vital Signs: Temp: 98.4 F (36.9 C) (05/25 0906) Temp Source: Oral (05/25 0906) BP: 124/59 (05/25 0906) Pulse Rate: 66 (05/25 0906)  Labs: Recent Labs    02/13/20 0319 02/13/20 1927 02/13/20 2127 02/14/20 0815  HGB  --  7.4*  --  7.7*  HCT  --  23.0*  --  24.1*  PLT  --  196  --  202  LABPROT 15.6* 15.5*  --  16.7*  INR 1.3* 1.3*  --  1.4*  CREATININE  --  0.98  --  0.84  TROPONINIHS  --  7 7  --     Estimated Creatinine Clearance: 82.5 mL/min (by C-G formula based on SCr of 0.84 mg/dL).   Medical History: Past Medical History:  Diagnosis Date  . BPH associated with nocturia   . Chronic constipation   . COPD (chronic obstructive pulmonary disease) ([place])   . Eczema   . Heart murmur    at birth  . History of adenomatous polyp of colon   . History of basal cell carcinoma (BCC) of skin    post excision from trunk  . 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 20 years ago , 1999, caused by blood clot after knee scope"  DVT to PE  . History of depression    over 40 years ago nervous breakdown  . History of DVT (deep vein thrombosis)    11-06-2017 per pt and wife happened lower leg approx. 1999 after knee scope  . History of pulmonary embolus (PE)    11-06-2017 from DVT in approx. 1999 per wife and pt  . HOH (hard of hearing)   . Hyperlipidemia   . Hypertension    followed by pcp  . Long term (current) use of anticoagulants Coumadin-- followed by Pharmasist w/ [facility]   secondary to primary hypercoagulopathy w/ hx DVT and PE  . Myalgia and myositis, unspecified   . OA (osteoarthritis)    "all joints"  . PAF (paroxysmal atrial fibrillation)  ([place]) 2019  . Peripheral neuropathy   . Pinched nerve in neck    per pt causes intermittant numbness bilateral arms and hands  . Primary hypercoagulable state ([place])   . Prostate cancer [facility]) urologist-  dr Alyson Ingles  oncologist-- dr Tammi Klippel   dx 08-28-2017  Stage T1c, Gleason 4+4, PSA 14.1, vol 42.8cc-- planned treatment external radiation and ADT  . 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 Protein S deficiency ()   . Right bundle branch block   . Seborrheic dermatitis, unspecified   . Stroke ([place])   . Type 2 diabetes mellitus ([place])   . Urinary frequency   . Wears glasses     Medications:  Warfarin 17.5 mg daily except 15 mg daily on Tuesday/Thursday Lovenox 100 mg q 12 h bridging  Assessment: 79 y/o M with a h/o PE/DVT, PAF, hypercoagulability with protein C and S deficiency who is  s/p TKA 5/21. Patient was discharged 5/24 but had to call EMS as his knee buckled after getting out of car at his house.   Last dose of Lovenox was 5/24 AM Last dose of Coumadin was 5/23 PM, INR 1.3 (5/24)  Of note, hemoglobin was  7.4 on 5/24 (from 11.6 5/12), expected per Dr. Ricard Dillon ortho. PRBC x 1 given.  Today, 02/14/2020  INR 1.4  Hgb 7.7 (slightly up from 7.4 s/p 1 unit PRBC)  No visible bleeding reported  SCr wnl  Goal of Therapy:  INR 2-3 Monitor platelets by anticoagulation protocol: Yes   Plan:  Warfarin 15 mg once at 16:00 Continue Lovenox 100 mg q 12 hours Daily PT/INR  Peggyann Juba, PharmD, BCPS Pharmacy: [phone] 02/14/2020,9:24 AM

## 2020-02-15 LAB — TYPE AND SCREEN
ABO/RH(D): O POS
Antibody Screen: NEGATIVE
Unit division: 0

## 2020-02-15 LAB — GLUCOSE, CAPILLARY
Glucose-Capillary: 94 mg/dL (ref 70–99)
Glucose-Capillary: 115 mg/dL — ABNORMAL HIGH (ref 70–99)
Glucose-Capillary: 102 mg/dL — ABNORMAL HIGH (ref 70–99)
Glucose-Capillary: 106 mg/dL — ABNORMAL HIGH (ref 70–99)

## 2020-02-15 LAB — CBC
HCT: 24.6 % — ABNORMAL LOW (ref 39.0–52.0)
Hemoglobin: 7.9 g/dL — ABNORMAL LOW (ref 13.0–17.0)
MCH: 28.3 pg (ref 26.0–34.0)
MCHC: 32.1 g/dL (ref 30.0–36.0)
MCV: 88.2 fL (ref 80.0–100.0)
Platelets: 211 10*3/uL (ref 150–400)
RBC: 2.79 MIL/uL — ABNORMAL LOW (ref 4.22–5.81)
RDW: 14 % (ref 11.5–15.5)
WBC: 7.1 10*3/uL (ref 4.0–10.5)
nRBC: 0 % (ref 0.0–0.2)

## 2020-02-15 LAB — HEMOGLOBIN AND HEMATOCRIT, BLOOD
HCT: 26.1 % — ABNORMAL LOW (ref 39.0–52.0)
HCT: 26.1 % — ABNORMAL LOW (ref 39.0–52.0)
Hemoglobin: 8.2 g/dL — ABNORMAL LOW (ref 13.0–17.0)
Hemoglobin: 8.2 g/dL — ABNORMAL LOW (ref 13.0–17.0)

## 2020-02-15 LAB — BASIC METABOLIC PANEL
Anion gap: 8 (ref 5–15)
BUN: 20 mg/dL (ref 8–23)
CO2: 24 mmol/L (ref 22–32)
Calcium: 8 mg/dL — ABNORMAL LOW (ref 8.9–10.3)
Chloride: 101 mmol/L (ref 98–111)
Creatinine, Ser: 0.78 mg/dL (ref 0.61–1.24)
GFR calc Af Amer: >60 mL/min (ref >60)
GFR calc non Af Amer: >60 mL/min (ref >60)
Glucose, Bld: 108 mg/dL — ABNORMAL HIGH (ref 70–99)
Potassium: 4.1 mmol/L (ref 3.5–5.1)
Sodium: 133 mmol/L — ABNORMAL LOW (ref 135–145)

## 2020-02-15 LAB — PROTIME-INR
INR: 1.5 — ABNORMAL HIGH (ref 0.8–1.2)
Prothrombin Time: 17.6 seconds — ABNORMAL HIGH (ref 11.4–15.2)

## 2020-02-15 LAB — BPAM RBC
Blood Product Expiration Date: 202106242359
ISSUE DATE / TIME: 202105250344
Unit Type and Rh: 5100

## 2020-02-15 MED ORDER — WARFARIN SODIUM 5 MG PO TABS
17.5000 mg | ORAL_TABLET | Freq: Once | ORAL | Status: AC
  Administered 2020-02-15: 17.5 mg via ORAL
  Filled 2020-02-15: qty 3

## 2020-02-15 NOTE — Progress Notes (Signed)
Colton Miranda for Warfarin and Lovenox Indication: history of PE and PAF  No Known Allergies  Patient Measurements: Height: 5\' 4"  (162.6 cm) Weight: 112.5 kg (248 lb 0.3 oz) IBW/kg (Calculated) : 59.2  Vital Signs: Temp: 98.5 F (36.9 C) (05/26 0711) Temp Source: Oral (05/26 0711) BP: 158/61 (05/26 0711) Pulse Rate: 64 (05/26 0711)  Labs: Recent Labs    02/13/20 1927 02/13/20 1927 02/13/20 2127 02/14/20 0815 02/14/20 0815 02/14/20 1207 02/14/20 1207 02/14/20 1831 02/15/20 0256  HGB 7.4*   < >  --  7.7*   < > 7.6*   < > 8.1* 7.9*  HCT 23.0*   < >  --  24.1*   < > 23.7*  --  25.3* 24.6*  PLT 196  --   --  202  --   --   --   --  211  LABPROT 15.5*  --   --  16.7*  --   --   --   --  17.6*  INR 1.3*  --   --  1.4*  --   --   --   --  1.5*  CREATININE 0.98  --   --  0.84  --   --   --   --  0.78  TROPONINIHS 7  --  7  --   --   --   --   --   --    < > = values in this interval not displayed.    Estimated Creatinine Clearance: 86.6 mL/min (by C-G formula based on SCr of 0.78 mg/dL).   Medical History: Past Medical History:  Diagnosis Date  . BPH associated with nocturia   . Chronic constipation   . COPD (chronic obstructive pulmonary disease) ([place])   . Eczema   . Heart murmur    at birth  . History of adenomatous polyp of colon   . History of basal cell carcinoma (BCC) of skin    post excision from trunk  . 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 20 years ago , 1999, caused by blood clot after knee scope"  DVT to PE  . History of depression    over 40 years ago nervous breakdown  . History of DVT (deep vein thrombosis)    11-06-2017 per pt and wife happened lower leg approx. 1999 after knee scope  . History of pulmonary embolus (PE)    11-06-2017 from DVT in approx. 1999 per wife and pt  . HOH (hard of hearing)   . Hyperlipidemia   . Hypertension     followed by pcp  . Long term (current) use of anticoagulants Coumadin-- followed by Pharmasist w/ [facility]   secondary to primary hypercoagulopathy w/ hx DVT and PE  . Myalgia and myositis, unspecified   . OA (osteoarthritis)    "all joints"  . PAF (paroxysmal atrial fibrillation) ([place]) 2019  . Peripheral neuropathy   . Pinched nerve in neck    per pt causes intermittant numbness bilateral arms and hands  . Primary hypercoagulable state ([place])   . Prostate cancer [facility]) urologist-  dr Alyson Ingles  oncologist-- dr Tammi Klippel   dx 08-28-2017  Stage T1c, Gleason 4+4, PSA 14.1, vol 42.8cc-- planned treatment external radiation and ADT  . 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 Protein S deficiency ([place])   . Right bundle branch block   . Seborrheic dermatitis,  unspecified   . Stroke ([place])   . Type 2 diabetes mellitus ([place])   . Urinary frequency   . Wears glasses     Medications:  Warfarin 17.5 mg daily except 15 mg daily on Tuesday/Thursday Lovenox 100 mg q 12 h bridging  Assessment: 79 y/o M with a h/o PE/DVT, PAF, hypercoagulability with protein C and S deficiency who is  s/p TKA 5/21. Patient was discharged 5/24 but had to call EMS as his knee buckled after getting out of car at his house.   Last dose of Lovenox was 5/24 AM Last dose of Coumadin was 5/23 PM, INR 1.3 (5/24)  Of note, hemoglobin was 7.4 on 5/24 (from 11.6 5/12), expected per Dr. Ricard Dillon ortho. PRBC x 1 given.  Today, 02/15/2020  INR 1.5  Hgb 7.9 (slightly up from 7.4 s/p 1 unit PRBC)  Small amount of blood underneath the knee dressing  SCr wnl  Goal of Therapy:  INR 2-3 Monitor platelets by anticoagulation protocol: Yes   Plan:  Warfarin 17.5 mg once at 16:00 Continue Lovenox 100 mg q 12 hours Daily PT/INR  Peggyann Juba, PharmD, BCPS Pharmacy: [phone] 02/15/2020,7:11 AM

## 2020-02-15 NOTE — Evaluation (Signed)
Occupational Therapy Evaluation Patient Details Name: Colton Miranda MRN: [id number] DOB: 1941-09-10 Today's Date: 02/15/2020    History of Present Illness Patient is 79 y.o. male s/p Lt TKA on 02/10/20 with PMH significant for DM, Stroke, protate cancer, PAF, OA, HTN, HLD, HOH, hx of DVT and PE, COPD, Rt THA In 2010. [place] 5/24 post TKA, knees buckled getting up a curb.Reportedly hypotensive with systolics of 0000000 on EMS arrival. Normotensive in the ED. HGB 7.4, received 1 unit of blood.   Clinical Impression   Mr. Colton Miranda is a 79 year old man s/p L TKA and fall at home who returns to the hospital with low hemoglobin. On evaluation patient presents with functional strength of upper extremities with MMT (except left shoulder), decreased ROM/strength of LLE, reports of generalized weakness, poor activity tolerance and impaired balance resulting in decreased ability to perform safe functional mobility and ADLs. Patient reports left knee buckling. Patient will benefit from skilled OT services to improve deficits and return to PLOF. Recommend short term rehab at discharge.    Follow Up Recommendations  SNF    Equipment Recommendations       Recommendations for Other Services       Precautions / Restrictions Precautions Precautions: Knee;Fall Precaution Comments: KI for amb (Hx knee buckle with fall at home) Required Braces or Orthoses: Knee Immobilizer - Left Restrictions Weight Bearing Restrictions: No LLE Weight Bearing: Weight bearing as tolerated      Mobility Bed Mobility   Bed Mobility: Supine to Sit     Supine to sit: Max assist;HOB elevated     General bed mobility comments: Max assist to transfer to side of bed - needing assistance for LLE and hand hold to pull patient into sitting and to assist with scooting to edge of bed  Transfers Overall transfer level: Needs assistance Equipment used: Rolling walker (2 wheeled) Transfers: Sit to/from [employer] Sit to Stand: Min assist Stand pivot transfers: Min assist       General transfer comment: MIn assist from elevated bed height. Small steps - mostly pivoting on RLE to transfer to recilner using rw.    Balance     Sitting balance-Leahy Scale: Good     Standing balance support: Bilateral upper extremity supported Standing balance-Leahy Scale: Poor                             ADL either performed or assessed with clinical judgement   ADL Overall ADL's : Needs assistance/impaired Eating/Feeding: Independent   Grooming: Set up;Sitting;Wash/dry hands;Wash/dry face;Oral care   Upper Body Bathing: Set up;Sitting   Lower Body Bathing: Sit to/from stand;Maximal assistance   Upper Body Dressing : Set up;Sitting   Lower Body Dressing: Maximal assistance;Sit to/from stand   Toilet Transfer: Minimal assistance;Stand-pivot;BSC;RW   Toileting- Clothing Manipulation and Hygiene: Moderate assistance   Tub/ Shower Transfer: Shower seat;Grab bars;Rolling walker;Stand-pivot   Functional mobility during ADLs: Minimal assistance;Rolling walker       Vision         Perception     Praxis      Pertinent Vitals/Pain Pain Assessment: 0-10 Pain Score: 4  Pain Location: Lt knee Pain Descriptors / Indicators: Aching;Sore;Operative site guarding;Tender;Tightness Pain Intervention(s): Monitored during session     Hand Dominance Right   Extremity/Trunk Assessment Upper Extremity Assessment Upper Extremity Assessment: Overall WFL for tasks assessed;LUE deficits/detail LUE Deficits / Details: Decreased L shoulder ROM/strength secondary to chronic  injury           Communication Communication Communication: No difficulties   Cognition Arousal/Alertness: Awake/alert Behavior During Therapy: WFL for tasks assessed/performed Overall Cognitive Status: Within Functional Limits for tasks assessed                                 General Comments:  AxO x 4   General Comments       Exercises     Shoulder Instructions      Home Living Family/patient expects to be discharged to:: Private residence Living Arrangements: Spouse/significant other Available Help at Discharge: Family Type of Home: House Home Access: Stairs to enter Technical brewer of Steps: 1+1+1 Entrance Stairs-Rails: None Home Layout: One level     Bathroom Shower/Tub: Teacher, early years/pre: Handicapped height Bathroom Accessibility: Yes   Home Equipment: Environmental consultant - 2 wheels;Cane - single point;Tub bench;Hospital bed          Prior Functioning/Environment Level of Independence: Independent with assistive device(s);Needs assistance  Gait / Transfers Assistance Needed: pt using RW and cane for  mobility ADL's / Homemaking Assistance Needed: pt's wife has been assisting with bathing and getting in/out of tub safely and with dressing lower body.             OT Problem List: Decreased activity tolerance;Impaired balance (sitting and/or standing);Pain;Decreased knowledge of use of DME or AE;Decreased strength      OT Treatment/Interventions: Self-care/ADL training;Therapeutic exercise;DME and/or AE instruction;Therapeutic activities;Balance training;Patient/family education    OT Goals(Current goals can be found in the care plan section) Acute Rehab OT Goals Patient Stated Goal: To get stronger so his wife doesn't have to "do too much" OT Goal Formulation: With patient Time For Goal Achievement: 02/29/20 Potential to Achieve Goals: Good  OT Frequency: Min 2X/week   Barriers to D/C:            Co-evaluation              AM-PAC OT "6 Clicks" Daily Activity     Outcome Measure Help from another person eating meals?: None Help from another person taking care of personal grooming?: A Little Help from another person toileting, which includes using toliet, bedpan, or urinal?: A Lot Help from another person bathing (including  washing, rinsing, drying)?: A Lot Help from another person to put on and taking off regular upper body clothing?: A Little Help from another person to put on and taking off regular lower body clothing?: A Lot 6 Click Score: 16   End of Session Equipment Utilized During Treatment: Gait belt;Rolling walker  Activity Tolerance: Patient tolerated treatment well Patient left: in chair;with call bell/phone within reach;Other (comment)(legs elevated and extended)  OT Visit Diagnosis: Unsteadiness on feet (R26.81);Other abnormalities of gait and mobility (R26.89);Muscle weakness (generalized) (M62.81);Pain Pain - Right/Left: Left Pain - part of body: Knee                Time: [id number] OT Time Calculation (min): 20 min Charges:  OT General Charges $OT Visit: 1 Visit OT Evaluation $OT Eval Low Complexity: 1 Low  Japheth Diekman, OTR/L Acute Care Rehab Services  Office [phone]   Lenward Chancellor 02/15/2020, 1:12 PM

## 2020-02-15 NOTE — Progress Notes (Signed)
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 Patient is s/p left total knee arthroplasty secondary to severe osteoarthritis on 5/21 and was just discharged to go home 5/24.He was getting out of car and trying towalk up acurb when he felt his knee buckle from under him but did not fall. Noted slight lightheaded but no chest or shortness of breath.Reportedly hypotensive with systolics of 0000000 on EMS arrival. Normotensive here in the ED. LABS: No leukocytosis, Hgb of 7.4 from a prior of 11.6. N of 129, glucose of 166, creatinine normal at 0.98. INR of 1.3 currently on Lovenox bridge.CT head and CTA chest negative. ED physician discussed with orthopedic Dr. Ricard Dillon and they feel drop inhemoglobin is expected and was admitted for symptomatic anemia physical deconditioning   Subjective: Working with PT this morning on the bedside chair, feels more energetic today, hemoglobin has improved.  He would like to go to skilled nursing facility if possible.  Reports his wife will have difficulty managing at home.   Assessment & Plan:  Symptomatic anemia due to acute blood loss, postop in the setting of left total knee arthroplasty 5/21: Plan hemoglobin was 11 g May 12/21. On admission  7.4 gm on 5/24. Status post 1 unit PRBC hemoglobin is stable and 8.1 g range.  Patient feels improved.  Will hold off on further blood transfusion unless hemoglobin down trends further, monitor serial H&H. Recent Labs  Lab 02/14/20 0815 02/14/20 1207 02/14/20 1831 02/15/20 0256 02/15/20 1118  HGB 7.7* 7.6* 8.1*  7.9* 8.2*  HCT 24.1* 23.7* 25.3* 24.6* 26.1*   Severe osteoarthritis of left knee status post TKA 5/21 seen by Dr. Ernestine Mcmurray continue PT OT wound care plan as per orthopedics.  Severe deconditioning.  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  Appears deconditioned and weak.  PT OT to continue patient would like to go to skilled nursing facility if accepted.  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  Currently on Coumadin with Lovenox bridge, pharmacy dosing.   DM sugars are stable on sliding scale insulin.   Recent Labs  Lab 02/14/20 1209 02/14/20 1641 02/14/20 2330 02/15/20 0735 02/15/20 1142  GLUCAP 107* 105* 108* 94 115*   Lab Results  Component Value Date   HGBA1C 6.7 (H) 02/01/2020   PAF rate controlled continue anticoagulation.  HLD continue statin  Hyponatremia-stable  Morbid obesity with BMI 42.  Will benefit without weight loss PCP follow-up  DVT prophylaxis:lovenox/Coumadin Code Status:full Family Communication: plan of care discussed with patient at bedside.  Status is: Inpatient Remains inpatient appropriate because:Ongoing active pain requiring inpatient pain management, Unsafe d/c plan .  Monitor H&H next 24 hours prior  while patient is on Coumadin/Lovenox.  Dispo: The patient is from: Home              Anticipated d/c is to: SNF [facility].  Patient reports he would like to go to SNF if recommended.              Anticipated d/c date is: 1 day  Patient currently is medically stable to d/c.  Diet Order            Diet Heart Room service appropriate? Yes; Fluid consistency: Thin  Diet effective now              Body mass index is 42.57 kg/m. Consultants:see note  Procedures:see note Microbiology:see note  Medications: Scheduled Meds: . sodium chloride   Intravenous Once  . alfuzosin   10 mg Oral Q breakfast  . enoxaparin (LOVENOX) injection  100 mg Subcutaneous Q12H  . insulin aspart  0-9 Units Subcutaneous TID WC  . senna-docusate  1 tablet Oral QHS  . simvastatin  20 mg Oral Daily  . warfarin  17.5 mg Oral ONCE-1600  . Warfarin - Pharmacist Dosing Inpatient   Does not apply q1600   Continuous Infusions:  Antimicrobials: Anti-infectives (From admission, onward)   None       Objective: Vitals: Today's Vitals   02/14/20 1504 02/14/20 2329 02/14/20 2330 02/15/20 0711  BP: (!) 124/54 (!) 155/57  (!) 158/61  Pulse: 68 64  64  Resp: 16 20  12   Temp: 98.4 F (36.9 C) 98.2 F (36.8 C)  98.5 F (36.9 C)  TempSrc: Oral Oral  Oral  SpO2: 97% 99%  97%  Weight:      Height:      PainSc:   3      Intake/Output Summary (Last 24 hours) at 02/15/2020 1145 Last data filed at 02/15/2020 0900 Gross per 24 hour  Intake 960 ml  Output 1600 ml  Net -640 ml   Filed Weights   02/13/20 1817 02/14/20 0325  Weight: 106.1 kg 112.5 kg   Weight change:    Intake/Output from previous day: 05/25 0701 - 05/26 0700 In: 720 [P.O.:720] Out: 1450 [Urine:1450] Intake/Output this shift: Total I/O In: 360 [P.O.:360] Out: 250 [Urine:250]  Examination:  General exam: AAOx3 obese,NAD, weak appearing. HEENT:Oral mucosa moist, Ear/Nose WNL grossly,dentition normal. Respiratory system: bilaterally CLEAR, OBESE, no wheezing or crackles,no use of accessory muscle, non tender. Cardiovascular system: S1 & S2 +, regular, No JVD. Gastrointestinal system: Abdomen soft, NT,ND, BS+. Nervous System:Alert, awake, moving extremities and grossly nonfocal Extremities: Left knee with dressing intact with small bleeding underneath.  Left knee appears swollen than the right.  No edema, distal peripheral pulses palpable.  Skin: No rashes,no icterus. MSK: Normal muscle bulk,tone, power  Data Reviewed: I have personally reviewed following labs and imaging studies CBC: Recent Labs  Lab  02/13/20 1927 02/13/20 1927 02/14/20 0815 02/14/20 1207 02/14/20 1831 02/15/20 0256 02/15/20 1118  WBC 8.9  --  7.5  --   --  7.1  --   NEUTROABS 7.3  --   --   --   --   --   --   HGB 7.4*   < > 7.7* 7.6* 8.1* 7.9* 8.2*  HCT 23.0*   < > 24.1* 23.7* 25.3* 24.6* 26.1*  MCV 87.1  --  87.6  --   --  88.2  --   PLT 196  --  202  --   --  211  --    < > = values in this interval not displayed.   Basic Metabolic Panel: Recent Labs  Lab 02/13/20 1927 02/14/20 0815 02/15/20 0256  NA 129* 133* 133*  K 4.6 4.3 4.1  CL 97* 99 101  CO2 25 25 24   GLUCOSE 177* 103* 108*  BUN 25* 19 20  CREATININE 0.98 0.84 0.78  CALCIUM  8.3* 8.3* 8.0*   GFR: Estimated Creatinine Clearance: 86.6 mL/min (by C-G formula based on SCr of 0.78 mg/dL). Liver Function Tests: Recent Labs  Lab 02/13/20 1927  AST 28  ALT 27  ALKPHOS 66  BILITOT 0.7  PROT 6.0*  ALBUMIN 2.9*   No results for input(s): LIPASE, AMYLASE in the last 168 hours. No results for input(s): AMMONIA in the last 168 hours. Coagulation Profile: Recent Labs  Lab 02/12/20 0259 02/13/20 0319 02/13/20 1927 02/14/20 0815 02/15/20 0256  INR 1.1 1.3* 1.3* 1.4* 1.5*   Cardiac Enzymes: No results for input(s): CKTOTAL, CKMB, CKMBINDEX, TROPONINI in the last 168 hours. BNP (last 3 results) No results for input(s): PROBNP in the last 8760 hours. HbA1C: No results for input(s): HGBA1C in the last 72 hours. CBG: Recent Labs  Lab 02/14/20 1209 02/14/20 1641 02/14/20 2330 02/15/20 0735 02/15/20 1142  GLUCAP 107* 105* 108* 94 115*   Lipid Profile: No results for input(s): CHOL, HDL, LDLCALC, TRIG, CHOLHDL, LDLDIRECT in the last 72 hours. Thyroid Function Tests: No results for input(s): TSH, T4TOTAL, FREET4, T3FREE, THYROIDAB in the last 72 hours. Anemia Panel: No results for input(s): VITAMINB12, FOLATE, FERRITIN, TIBC, IRON, RETICCTPCT in the last 72 hours. Sepsis Labs: No results for input(s): PROCALCITON, LATICACIDVEN in  the last 168 hours.  Recent Results (from the past 240 hour(s))  SARS CORONAVIRUS 2 (TAT 6-24 HRS) Nasopharyngeal Nasopharyngeal Swab     Status: None   Collection Time: 02/07/20  2:55 PM   Specimen: Nasopharyngeal Swab  Result Value Ref Range Status   SARS Coronavirus 2 NEGATIVE NEGATIVE Final    Comment: (NOTE) SARS-CoV-2 target nucleic acids are NOT DETECTED. The SARS-CoV-2 RNA is generally detectable in upper and lower respiratory specimens during the acute phase of infection. Negative results do not preclude SARS-CoV-2 infection, do not rule out co-infections with other pathogens, and should not be used as the sole basis for treatment or other patient management decisions. Negative results must be combined with clinical observations, patient history, and epidemiological information. The expected result is Negative. Fact Sheet for Patients: SugarRoll.be Fact Sheet for Healthcare Providers: https://www.woods-mathews.com/ This test is not yet approved or cleared by the [place] FDA and  has been authorized for detection and/or diagnosis of SARS-CoV-2 by FDA under an Emergency Use Authorization (EUA). This EUA will remain  in effect (meaning this test can be used) for the duration of the COVID-19 declaration under Section 56 4(b)(1) of the Act, 21 U.S.C. section 360bbb-3(b)(1), unless the authorization is terminated or revoked sooner. Performed at [facility], [place] [address], [place], [place] [address]   SARS Coronavirus 2 by RT PCR (hospital order, performed in [facility] hospital lab) Nasopharyngeal Nasopharyngeal Swab     Status: None   Collection Time: 02/14/20 12:05 AM   Specimen: Nasopharyngeal Swab  Result Value Ref Range Status   SARS Coronavirus 2 NEGATIVE NEGATIVE Final    Comment: (NOTE) SARS-CoV-2 target nucleic acids are NOT DETECTED. The SARS-CoV-2 RNA is generally detectable in upper and lower respiratory  specimens during the acute phase of infection. The lowest concentration of SARS-CoV-2 viral copies this assay can detect is 250 copies / mL. A negative result does not preclude SARS-CoV-2 infection and should not be used as the sole basis for treatment or other patient management decisions.  A negative result may occur with improper specimen collection / handling, submission of specimen other than nasopharyngeal swab, presence of viral mutation(s) within the areas targeted by this  assay, and inadequate number of viral copies (<250 copies / mL). A negative result must be combined with clinical observations, patient history, and epidemiological information. Fact Sheet for Patients:   StrictlyIdeas.no Fact Sheet for Healthcare Providers: BankingDealers.co.za This test is not yet approved or cleared  by the [place] FDA and has been authorized for detection and/or diagnosis of SARS-CoV-2 by FDA under an Emergency Use Authorization (EUA).  This EUA will remain in effect (meaning this test can be used) for the duration of the COVID-19 declaration under Section 564(b)(1) of the Act, 21 U.S.C. section 360bbb-3(b)(1), unless the authorization is terminated or revoked sooner. Performed at [facility], [place] [address] [place], [place] [address]       Radiology Studies: DG Chest 2 View  Result Date: 02/13/2020 CLINICAL DATA:  Syncope. EXAM: CHEST - 2 VIEW COMPARISON:  December 02, 2016. FINDINGS: The heart size and mediastinal contours are within normal limits. Both lungs are clear. No pneumothorax or pleural effusion is noted. The visualized skeletal structures are unremarkable. IMPRESSION: No active cardiopulmonary disease. Electronically Signed   By: Marijo Conception M.D.   On: 02/13/2020 20:00   CT Head Wo Contrast  Result Date: 02/13/2020 CLINICAL DATA:  Neuro deficit, acute, stroke suspected lightheadedness, syncope, hx  stroke, concern for head bleed or recurrent stroke Discharged earlier today after knee replacement. Patient reports a near syncopal episode getting out of car. EXAM: CT HEAD WITHOUT CONTRAST TECHNIQUE: Contiguous axial images were obtained from the base of the skull through the vertex without intravenous contrast. COMPARISON:  Head CT 06/21/2019, brain MRI 06/22/2019 FINDINGS: Brain: No evidence of acute infarction, hemorrhage, hydrocephalus, extra-axial collection or mass lesion/mass effect. Stable degree of generalized atrophy and chronic small vessel ischemia. Unchanged remote left parietal infarct. Vascular: Atherosclerosis of skullbase vasculature without hyperdense vessel or abnormal calcification. Skull: No fracture or focal lesion. Sinuses/Orbits: Minor mucosal thickening of ethmoid air cells. No sinus fluid levels. Orbits are unremarkable. Other: None. IMPRESSION: 1. No acute intracranial abnormality. 2. Stable atrophy, chronic small vessel ischemia, and remote left parietal infarct. Electronically Signed   By: Keith Rake M.D.   On: 02/13/2020 22:07   CT Angio Chest PE W and/or Wo Contrast  Result Date: 02/13/2020 CLINICAL DATA:  Syncopal episode. EXAM: CT ANGIOGRAPHY CHEST WITH CONTRAST TECHNIQUE: Multidetector CT imaging of the chest was performed using the standard protocol during bolus administration of intravenous contrast. Multiplanar CT image reconstructions and MIPs were obtained to evaluate the vascular anatomy. CONTRAST:  1108mL OMNIPAQUE IOHEXOL 350 MG/ML SOLN COMPARISON:  None. FINDINGS: Cardiovascular: There is moderate to marked severity calcification of the aortic arch. Satisfactory opacification of the pulmonary arteries to the segmental level. No evidence of pulmonary embolism. Normal heart size. No pericardial effusion. Moderate severity coronary artery calcification is seen. Mediastinum/Nodes: No enlarged mediastinal, hilar, or axillary lymph nodes. Thyroid gland, trachea, and  esophagus demonstrate no significant findings. Lungs/Pleura: Lungs are clear. No pleural effusion or pneumothorax. Upper Abdomen: There is a small hiatal hernia. Musculoskeletal: Multilevel degenerative changes seen throughout the thoracic spine. Review of the MIP images confirms the above findings. IMPRESSION: 1. No evidence of pulmonary embolus. 2. Moderate to marked severity calcification of the aortic arch. 3. Moderate severity coronary artery calcification. 4. Small hiatal hernia. 5. Multilevel degenerative changes throughout the thoracic spine. Aortic Atherosclerosis (ICD10-I70.0). Electronically Signed   By: Virgina Norfolk M.D.   On: 02/13/2020 22:13     LOS: 1 day   Antonieta Pert, MD [facility]  02/15/2020, 11:45 AM

## 2020-02-15 NOTE — Progress Notes (Signed)
Patient ID: Colton Miranda, male   DOB: Nov 13, 1940, 79 y.o.   MRN: [id number]  Chart reviewed, see that Hemoglobin has improved and patient is feeling better but no well enough to go home.  SNF is being considered.  Ortho will come by tomorrow to see patient.

## 2020-02-15 NOTE — Plan of Care (Signed)

## 2020-02-15 NOTE — Progress Notes (Signed)
Physical Therapy Treatment Patient Details Name: Colton Miranda MRN: [id number] DOB: 16-Mar-1941 Today's Date: 02/15/2020    History of Present Illness Patient is 79 y.o. male s/p Lt TKA on 02/10/20 with PMH significant for DM, Stroke, protate cancer, PAF, OA, HTN, HLD, HOH, hx of DVT and PE, COPD, Rt THA In 2010. [place] 5/24 post TKA, knees buckled getting up a curb.Reportedly hypotensive with systolics of 0000000 on EMS arrival. Normotensive in the ED. HGB 7.4, received 1 unit of blood.    PT Comments    Pt OOB in recliner.  Pt issued a KI for amb use only for increased support.  Assisted with amb in hallway.  General Gait Details: increased time with cues for sequence, posture and position from RW.  Pt feels "tired".  Limited distance. Then returned to room to perform some TE's following HEP handout.  Instructed on proper tech, freq as well as use of ICE.  Positioned in recliner with L LE elevated.     Follow Up Recommendations  Home health PT;SNF(pending progress and family decision)     Equipment Recommendations  None recommended by PT    Recommendations for Other Services       Precautions / Restrictions Precautions Precautions: Knee;Fall Precaution Comments: KI for amb (Hx knee buckle with fall at home) Required Braces or Orthoses: Knee Immobilizer - Left Restrictions Weight Bearing Restrictions: No LLE Weight Bearing: Weight bearing as tolerated    Mobility  Bed Mobility               General bed mobility comments: OOB in recliner  Transfers Overall transfer level: Needs assistance Equipment used: Rolling walker (2 wheeled) Transfers: Sit to/from Stand Sit to Stand: Min assist Stand pivot transfers: +2 safety/equipment       General transfer comment: increased time  from [facility], Left knee buckling, Cues for hand placement  Ambulation/Gait Ambulation/Gait assistance: Min guard;+2 safety/equipment Gait Distance (Feet): 22 Feet Assistive device: Rolling walker (2  wheeled) Gait Pattern/deviations: Step-through pattern;Decreased stride length Gait velocity: decreased   General Gait Details: increased time with cues for sequence, posture and position from RW.  Pt feels "tired".  Limited distance.   Stairs             Wheelchair Mobility    Modified Rankin (Stroke Patients Only)       Balance                                            Cognition Arousal/Alertness: Awake/alert Behavior During Therapy: WFL for tasks assessed/performed Overall Cognitive Status: Within Functional Limits for tasks assessed                                 General Comments: AxO x 4      Exercises      General Comments        Pertinent Vitals/Pain Pain Assessment: 0-10 Pain Score: 4  Pain Location: Lt knee Pain Descriptors / Indicators: Aching;Sore;Operative site guarding;Tender;Tightness Pain Intervention(s): Monitored during session;Repositioned;Premedicated before session;Ice applied    Home Living                      Prior Function            PT Goals (current goals can now be found in the  care plan section) Progress towards PT goals: Progressing toward goals    Frequency    7X/week      PT Plan Current plan remains appropriate    Co-evaluation              AM-PAC PT "6 Clicks" Mobility   Outcome Measure  Help needed turning from your back to your side while in a flat bed without using bedrails?: A Little Help needed moving from lying on your back to sitting on the side of a flat bed without using bedrails?: A Little Help needed moving to and from a bed to a chair (including a wheelchair)?: A Lot Help needed standing up from a chair using your arms (e.g., wheelchair or bedside chair)?: A Lot Help needed to walk in hospital room?: A Lot Help needed climbing 3-5 steps with a railing? : Total 6 Click Score: 13    End of Session Equipment Utilized During Treatment: Gait  belt Activity Tolerance: Patient limited by fatigue Patient left: in chair;with call bell/phone within reach;with chair alarm set Nurse Communication: Mobility status PT Visit Diagnosis: Muscle weakness (generalized) (M62.81);Difficulty in walking, not elsewhere classified (R26.2);History of falling (Z91.81)     Time: [id number] PT Time Calculation (min) (ACUTE ONLY): 25 min  Charges:  $Gait Training: 8-22 mins $Therapeutic Exercise: 8-22 mins                     {Westlynn Fifer  PTA Acute  Rehabilitation Services Pager      [phone] Office      [phone]

## 2020-02-15 NOTE — TOC Progression Note (Signed)
Transition of Care [facility]) - Progression Note    Patient Details  Name: Colton Miranda MRN: [id number] Date of Birth: [birth year]  [facility] CM[facility]SW Contact  Purcell Mouton, RN Phone Number: 02/15/2020, 2:28 PM  Clinical Narrative:    Pt was active with [facility]. Pt's wife states that they will continue with them.   Expected Discharge Plan: [place] Barriers to Discharge: No Barriers Identified  Expected Discharge Plan and Services Expected Discharge Plan: [place]   Discharge Planning Services: CM Consult   Living arrangements for the past 2 months: Single Family Home                                       Social Determinants of Health (SDOH) Interventions    Readmission Risk Interventions No flowsheet data found.

## 2020-02-15 NOTE — Discharge Summary (Signed)
Physician Discharge Summary  Patient ID: Colton Miranda MRN: [id number] DOB/AGE: 25-Jun-1941 79 y.o.  Admit date: 02/10/2020 Discharge date: 02/15/2020  Admission Diagnoses: Left knee Osteoarthritis  Discharge Diagnoses:  Active Problems:   Osteoarthritis of left knee   Discharged Condition: stable  Hospital Course: patient was admitted on 5/21 for a left total knee arthroplasty for end-stage left knee osteoarthritis. Patient was brought back to the OR and was prepped for surgery.  Patient did well during surgery no complications.  Patient was taken to PACU in stable condition and then was sent up to the floor in stable condition.  Patient worked with physical therapy the first night, minimal ambulation due to fatigue. Postop day 1 patient doing well.  No events overnight.  Continue to work with physical therapy.was able to ambulate about 100 feet in his first physical therapy assessment. Patient had a second physical therapy assessment in the afternoon and reported some dizziness with steps.  Another night in the hospital was recommended. Postop day 2 patient doing well.  No events overnight. Patient continued working with physical therapy but did report some fatigue with therapy sessions that improved with sitting. Postop day 3, no events overnight.  Patient reports feeling better.  He worked with physical therapy in the morning and was cleared to be discharged home.  Sent home with pain medication, muscle relaxer and nausea medication.  Going to resume his home dose of Coumadin as well as his Lovenox bridge that his primary care doctor has placed him on.  He is going to be doing home health therapy.  Consults: None  Significant Diagnostic Studies: none  Treatments: IV hydration, antibiotics: Ancef, analgesia: acetaminophen, Dilaudid and oxycodone, Methocarbamol, anticoagulation: warfarin and Lovenox, therapies: PT and surgery: Left TKA  Discharge Exam: Blood pressure 131/65, pulse  75, temperature 98.5 F (36.9 C), resp. rate 16, height 5' 4" (1.626 m), weight 107 kg, SpO2 95 %. General appearance: alert, cooperative, appears stated age and no distress Extremities: extremities normal, atraumatic, no cyanosis or edema, Homans sign is negative, no sign of DVT, no edema, redness or tenderness in the calves or thighs and no ulcers, gangrene or trophic changes Pulses: 2+ and symmetric Skin: Skin color, texture, turgor normal. No rashes or lesions Neurologic: Alert and oriented X 3, normal strength and tone. Normal symmetric reflexes. Normal coordination and gait Incision/Wound: Dressings are clean dry and intact  Disposition: Discharge disposition: 06-Home-Health Care Svc       Discharge Instructions    Call MD / Call 911   Complete by: As directed    If you experience chest pain or shortness of breath, CALL 911 and be transported to the hospital emergency room.  If you develope a fever above 101 F, pus (white drainage) or increased drainage or redness at the wound, or calf pain, call your surgeon's office.   Call MD / Call 911   Complete by: As directed    If you experience chest pain or shortness of breath, CALL 911 and be transported to the hospital emergency room.  If you develope a fever above 101 F, pus (white drainage) or increased drainage or redness at the wound, or calf pain, call your surgeon's office.   Constipation Prevention   Complete by: As directed    Drink plenty of fluids.  Prune juice may be helpful.  You may use a stool softener, such as Colace (over the counter) 100 mg twice a day.  Use MiraLax (over the counter) for constipation as  needed.   Constipation Prevention   Complete by: As directed    Drink plenty of fluids.  Prune juice may be helpful.  You may use a stool softener, such as Colace (over the counter) 100 mg twice a day.  Use MiraLax (over the counter) for constipation as needed.   Diet - low sodium heart healthy   Complete by: As  directed    Diet - low sodium heart healthy   Complete by: As directed    Discharge instructions   Complete by: As directed    Dr. Sydnee Cabal [facility] [address] [place], [place] [address] [phone]  TOTAL KNEE REPLACEMENT POSTOPERATIVE DIRECTIONS  Knee Rehabilitation, Guidelines Following Surgery  Results after knee surgery are often greatly improved when you follow the exercise, range of motion and muscle strengthening exercises prescribed by your doctor. Safety measures are also important to protect the knee from further injury. Any time any of these exercises cause you to have increased pain or swelling in your knee joint, decrease the amount until you are comfortable again and slowly increase them. If you have problems or questions, call your caregiver or physical therapist for advice.   HOME CARE INSTRUCTIONS  Remove items at home which could result in a fall. This includes throw rugs or furniture in walking pathways.  ICE to the affected knee every three hours for 30 minutes at a time and then as needed for pain and swelling.  Continue to use ice on the knee for pain and swelling from surgery. You may notice swelling that will progress down to the foot and ankle.  This is normal after surgery.  Elevate the leg when you are not up walking on it.   Continue to use the breathing machine which will help keep your temperature down.  It is common for your temperature to cycle up and down following surgery, especially at night when you are not up moving around and exerting yourself.  The breathing machine keeps your lungs expanded and your temperature down. Do not place pillow under knee, focus on keeping the knee straight while resting   DIET You may resume your previous home diet once your are discharged from the hospital.  DRESSING / WOUND CARE / SHOWERING Keep the surgical dressing until follow up.  The dressing is water proof, but you need to put extra  covering over it like plastic wrap.  IF THE DRESSING FALLS OFF or the wound gets wet inside, change the dressing with sterile gauze.  Please use good hand washing techniques before changing the dressing.  Do not use any lotions or creams on the incision until instructed by your surgeon.   You may start showering once you are discharged home but do not submerge the incision under water.  You are sent home with an ACE bandage on over the leg, this can be removed at 3 days from surgery. At this time you can start showering. Please place the white TED stocking on the surgical leg after. This needs to be worn on the surgical leg for 2 weeks after surgery.   ACTIVITY Walk with your walker as instructed. Use walker as long as suggested by your caregivers. Avoid periods of inactivity such as sitting longer than an hour when not asleep. This helps prevent blood clots.  You may resume a sexual relationship in one month or when given the OK by your doctor.  You may return to work once you are cleared by your  doctor.  Do not drive a car for 6 weeks or until released by you surgeon.  Do not drive while taking narcotics.  WEIGHT BEARING Weight bearing as tolerated with assist device (walker, cane, etc) as directed, use it as long as suggested by your surgeon or therapist, typically at least 4-6 weeks.  POSTOPERATIVE CONSTIPATION PROTOCOL Constipation - defined medically as fewer than three stools per week and severe constipation as less than one stool per week.  One of the most common issues patients have following surgery is constipation.  Even if you have a regular bowel pattern at home, your normal regimen is likely to be disrupted due to multiple reasons following surgery.  Combination of anesthesia, postoperative narcotics, change in appetite and fluid intake all can affect your bowels.  In order to avoid complications following surgery, here are some recommendations in order to help you during your  recovery period.  Colace (docusate) - Pick up an over-the-counter form of Colace or another stool softener and take twice a day as long as you are requiring postoperative pain medications.  Take with a full glass of water daily.  If you experience loose stools or diarrhea, hold the colace until you stool forms back up.  If your symptoms do not get better within 1 week or if they get worse, check with your doctor.  Dulcolax (bisacodyl) - Pick up over-the-counter and take as directed by the product packaging as needed to assist with the movement of your bowels.  Take with a full glass of water.  Use this product as needed if not relieved by Colace only.   MiraLax (polyethylene glycol) - Pick up over-the-counter to have on hand.  MiraLax is a solution that will increase the amount of water in your bowels to assist with bowel movements.  Take as directed and can mix with a glass of water, juice, soda, coffee, or tea.  Take if you go more than two days without a movement. Do not use MiraLax more than once per day. Call your doctor if you are still constipated or irregular after using this medication for 7 days in a row.  If you continue to have problems with postoperative constipation, please contact the office for further assistance and recommendations.  If you experience "the worst abdominal pain ever" or develop nausea or vomiting, please contact the office immediatly for further recommendations for treatment.  ITCHING  If you experience itching with your medications, try taking only a single pain pill, or even half a pain pill at a time.  You can also use Benadryl over the counter for itching or also to help with sleep.   TED HOSE STOCKINGS Wear the elastic stockings on both legs for two weeks following surgery during the day but you may remove then at night for sleeping.  Okay to remove ACE in 3 days, put TED on after  MEDICATIONS See your medication summary on the "After Visit Summary" that the  nursing staff will review with you prior to discharge.  You may have some home medications which will be placed on hold until you complete the course of blood thinner medication.  It is important for you to complete the blood thinner medication as prescribed by your surgeon.  Continue your approved medications as instructed at time of discharge. If you were not previously taking any blood thinners prior to surgery please start taking Aspirin 325 mg tabs twice daily for 6 weeks. If you are unable to take Aspirin please let  your doctor know.   PRECAUTIONS If you experience chest pain or shortness of breath - call 911 immediately for transfer to the hospital emergency department.  If you develop a fever greater that 101 F, purulent drainage from wound, increased redness or drainage from wound, foul odor from the wound/dressing, or calf pain - CONTACT YOUR SURGEON.                                                   FOLLOW-UP APPOINTMENTS Make sure you keep all of your appointments after your operation with your surgeon and caregivers. You should call the office at the above phone number and make an appointment for approximately two weeks after the date of your surgery or on the date instructed by your surgeon outlined in the "After Visit Summary".   RANGE OF MOTION AND STRENGTHENING EXERCISES  Rehabilitation of the knee is important following a knee injury or an operation. After just a few days of immobilization, the muscles of the thigh which control the knee become weakened and shrink (atrophy). Knee exercises are designed to build up the tone and strength of the thigh muscles and to improve knee motion. Often times heat used for twenty to thirty minutes before working out will loosen up your tissues and help with improving the range of motion but do not use heat for the first two weeks following surgery. These exercises can be done on a training (exercise) mat, on the floor, on a table or on a bed. Use  what ever works the best and is most comfortable for you Knee exercises include:  Leg Lifts - While your knee is still immobilized in a splint or cast, you can do straight leg raises. Lift the leg to 60 degrees, hold for 3 sec, and slowly lower the leg. Repeat 10-20 times 2-3 times daily. Perform this exercise against resistance later as your knee gets better.  Quad and Hamstring Sets - Tighten up the muscle on the front of the thigh (Quad) and hold for 5-10 sec. Repeat this 10-20 times hourly. Hamstring sets are done by pushing the foot backward against an object and holding for 5-10 sec. Repeat as with quad sets.  Leg Slides: Lying on your back, slowly slide your foot toward your buttocks, bending your knee up off the floor (only go as far as is comfortable). Then slowly slide your foot back down until your leg is flat on the floor again. Angel Wings: Lying on your back spread your legs to the side as far apart as you can without causing discomfort.  A rehabilitation program following serious knee injuries can speed recovery and prevent re-injury in the future due to weakened muscles. Contact your doctor or a physical therapist for more information on knee rehabilitation.   IF YOU ARE TRANSFERRED TO A SKILLED REHAB FACILITY If the patient is transferred to a skilled rehab facility following release from the hospital, a list of the current medications will be sent to the facility for the patient to continue.  When discharged from the skilled rehab facility, please have the facility set up the patient's [place] prior to being released. Also, the skilled facility will be responsible for providing the patient with their medications at time of release from the facility to include their pain medication, the muscle relaxants, and their blood  thinner medication. If the patient is still at the rehab facility at time of the two week follow up appointment, the skilled rehab facility will also  need to assist the patient in arranging follow up appointment in our office and any transportation needs.  MAKE SURE YOU:  Understand these instructions.  Get help right away if you are not doing well or get worse.    Pick up stool softner and laxative for home use following surgery while on pain medications. May shower starting three days after surgery. Please use a clean towel to pat the leg dry following showers. Continue to use ice for pain and swelling after surgery. Do not use any lotions or creams on the incision until instructed by you Start Aspirin immediately following surgery.   Do not put a pillow under the knee. Place it under the heel.   Complete by: As directed    Driving restrictions   Complete by: As directed    No driving for two weeks   Face-to-face encounter (required for Medicare/Medicaid patients)   Complete by: As directed    I Nicholes Stairs certify that this patient is under my care and that I, or a nurse practitioner or physician's assistant working with me, had a face-to-face encounter that meets the physician face-to-face encounter requirements with this patient on 02/11/2020. The encounter with the patient was in whole, or in part for the following medical condition(s) which is the primary reason for home health care (List medical condition): s/p L TKA   The encounter with the patient was in whole, or in part, for the following medical condition, which is the primary reason for home health care: s/p left tka   I certify that, based on my findings, the following services are medically necessary home health services: Physical therapy   Reason for Medically Necessary Home Health Services: Therapy- Personnel officer, Public librarian   My clinical findings support the need for the above services: Unable to leave home safely without assistance and/or assistive device   Further, I certify that my clinical findings support that this patient is  homebound due to: Unsafe ambulation due to balance issues   Home Health   Complete by: As directed    To provide the following care/treatments: PT   Increase activity slowly as tolerated   Complete by: As directed    TED hose   Complete by: As directed    Use stockings (TED hose) for three weeks on both leg(s).  You may remove them at night for sleeping.   Weight bearing as tolerated   Complete by: As directed      Allergies as of 02/13/2020   No Known Allergies     Medication List    TAKE these medications   acetaminophen 500 MG tablet Commonly known as: TYLENOL Take 1,000 mg by mouth every 6 (six) hours as needed for moderate pain.   alfuzosin 10 MG 24 hr tablet Commonly known as: Uroxatral Take 1 tablet (10 mg total) by mouth daily with breakfast.   diclofenac Sodium 1 % Gel Commonly known as: VOLTAREN Apply 1 application topically at bedtime.   diphenhydrAMINE 25 MG tablet Commonly known as: SOMINEX Take 25 mg by mouth at bedtime as needed for sleep.   enoxaparin 100 MG/ML injection Commonly known as: LOVENOX Inject 100 mg into the skin every 12 (twelve) hours.   lisinopril 20 MG tablet Commonly known as: ZESTRIL Take 1 tablet (20 mg total) by mouth  daily.   metFORMIN 500 MG tablet Commonly known as: GLUCOPHAGE TAKE 1 TABLET BY MOUTH ONCE DAILY WITH BREAKFAST What changed: See the new instructions.   methocarbamol 500 MG tablet Commonly known as: Robaxin Take 1 tablet (500 mg total) by mouth 4 (four) times daily.   nystatin cream Commonly known as: MYCOSTATIN Apply 1 application topically daily as needed (yeast).   ondansetron 4 MG tablet Commonly known as: Zofran Take 1 tablet (4 mg total) by mouth daily as needed for nausea or vomiting.   OneTouch Delica Lancets 42P Misc USE 1  TO CHECK GLUCOSE TWICE DAILY What changed: See the new instructions.   OneTouch Verio test strip Generic drug: glucose blood USE 1 STRIP TO CHECK GLUCOSE TWICE DAILY AS  DIRECTED What changed: See the new instructions.   OneTouch Verio w/Device Kit by Does not apply route. Check blood sugar twice daily as directed DX E11.65   OVER THE COUNTER MEDICATION Apply 1 application topically every other day. Foot miracle cream - apply every other night   oxyCODONE 5 MG immediate release tablet Commonly known as: Roxicodone Take 1 tablet (5 mg total) by mouth every 4 (four) hours as needed for up to 7 days for severe pain.   senna-docusate 8.6-50 MG tablet Commonly known as: Senokot-S Take 1 tablet by mouth at bedtime.   simvastatin 20 MG tablet Commonly known as: ZOCOR TAKE 1 TABLET BY MOUTH ONCE DAILY FOR CHOLESTEROL What changed: See the new instructions.   tolnaftate 1 % cream Commonly known as: TINACTIN Apply 1 application topically every other day. At night   traMADol 50 MG tablet Commonly known as: ULTRAM Take one to two tablets by mouth every 6 hours as needed for pain. Do not take more than 6 tablets in 24 hours What changed:   how much to take  how to take this  when to take this  reasons to take this  additional instructions   Vitamin D3 50 MCG (2000 UT) Tabs Take 2,000 Units by mouth daily.   warfarin 5 MG tablet Commonly known as: COUMADIN Take as directed. If you are unsure how to take this medication, talk to your nurse or doctor. Original instructions: Take 5-7.5 mg by mouth See admin instructions. Take 7.5 mg with the 10 mg to equal 17.5 mg after supper on Sun/Mon/Wed/Fri/Sat and 5 mg with the 10 mg to equal 15 mg after supper on Tues/Thurs What changed: Another medication with the same name was changed. Make sure you understand how and when to take each.   warfarin 10 MG tablet Commonly known as: COUMADIN Take as directed. If you are unsure how to take this medication, talk to your nurse or doctor. Original instructions: TAKE 1 TABLET BY MOUTH ONCE DAILY ALONG  WITH  A  5  MG  TABLET  FOR  A  TOTAL  OF  15  MG  DAILY What  changed:   how much to take  how to take this  when to take this  additional instructions            Discharge Care Instructions  (From admission, onward)         Start     Ordered   02/10/20 0000  Weight bearing as tolerated     02/10/20 0951         Follow-up Information    [facility] Follow up.   Specialty: Home Health Services Why: agency will provide home health physical therapy.  Contact information: [address] [address] [phone]           Signed: Drue Novel, PA-C [facility] 02/15/2020, 12:47 PM

## 2020-02-16 LAB — BASIC METABOLIC PANEL
Anion gap: 10 (ref 5–15)
BUN: 18 mg/dL (ref 8–23)
CO2: 23 mmol/L (ref 22–32)
Calcium: 8.2 mg/dL — ABNORMAL LOW (ref 8.9–10.3)
Chloride: 99 mmol/L (ref 98–111)
Creatinine, Ser: 0.69 mg/dL (ref 0.61–1.24)
GFR calc Af Amer: >60 mL/min (ref >60)
GFR calc non Af Amer: >60 mL/min (ref >60)
Glucose, Bld: 107 mg/dL — ABNORMAL HIGH (ref 70–99)
Potassium: 4.1 mmol/L (ref 3.5–5.1)
Sodium: 132 mmol/L — ABNORMAL LOW (ref 135–145)

## 2020-02-16 LAB — CBC
HCT: 25.5 % — ABNORMAL LOW (ref 39.0–52.0)
Hemoglobin: 7.9 g/dL — ABNORMAL LOW (ref 13.0–17.0)
MCH: 27.6 pg (ref 26.0–34.0)
MCHC: 31 g/dL (ref 30.0–36.0)
MCV: 89.2 fL (ref 80.0–100.0)
Platelets: 219 10*3/uL (ref 150–400)
RBC: 2.86 MIL/uL — ABNORMAL LOW (ref 4.22–5.81)
RDW: 14.3 % (ref 11.5–15.5)
WBC: 7 10*3/uL (ref 4.0–10.5)
nRBC: 0 % (ref 0.0–0.2)

## 2020-02-16 LAB — HEMOGLOBIN AND HEMATOCRIT, BLOOD
HCT: 25.8 % — ABNORMAL LOW (ref 39.0–52.0)
Hemoglobin: 8 g/dL — ABNORMAL LOW (ref 13.0–17.0)

## 2020-02-16 LAB — PROTIME-INR
INR: 1.5 — ABNORMAL HIGH (ref 0.8–1.2)
Prothrombin Time: 17.5 seconds — ABNORMAL HIGH (ref 11.4–15.2)

## 2020-02-16 LAB — GLUCOSE, CAPILLARY
Glucose-Capillary: 104 mg/dL — ABNORMAL HIGH (ref 70–99)
Glucose-Capillary: 96 mg/dL (ref 70–99)
Glucose-Capillary: 102 mg/dL — ABNORMAL HIGH (ref 70–99)
Glucose-Capillary: 122 mg/dL — ABNORMAL HIGH (ref 70–99)
Glucose-Capillary: 105 mg/dL — ABNORMAL HIGH (ref 70–99)

## 2020-02-16 MED ORDER — WARFARIN SODIUM 5 MG PO TABS
17.5000 mg | ORAL_TABLET | Freq: Once | ORAL | Status: AC
  Administered 2020-02-16: 17.5 mg via ORAL
  Filled 2020-02-16: qty 3

## 2020-02-16 NOTE — Progress Notes (Signed)
[facility] for Warfarin and Lovenox Indication: history of PE/DVT, hypercoagulability with protein C and S deficiency and PAF  No Known Allergies  Patient Measurements: Height: 5\' 4"  (162.6 cm) Weight: 112.5 kg (248 lb 0.3 oz) IBW/kg (Calculated) : 59.2  Vital Signs: Temp: 98.2 F (36.8 C) (05/27 0559) Temp Source: Oral (05/27 0559) BP: 138/61 (05/27 0559) Pulse Rate: 67 (05/27 0559)  Labs: Recent Labs    02/13/20 1927 02/13/20 1927 02/13/20 2127 02/14/20 0815 02/14/20 1207 02/15/20 0256 02/15/20 1118 02/15/20 1835 02/15/20 1835 02/16/20 0231 02/16/20 1059  HGB 7.4*   < >  --  7.7*   < > 7.9*   < > 8.2*   < > 7.9* 8.0*  HCT 23.0*   < >  --  24.1*   < > 24.6*   < > 26.1*  --  25.5* 25.8*  PLT 196   < >  --  202  --  211  --   --   --  219  --   LABPROT 15.5*   < >  --  16.7*  --  17.6*  --   --   --  17.5*  --   INR 1.3*   < >  --  1.4*  --  1.5*  --   --   --  1.5*  --   CREATININE 0.98   < >  --  0.84  --  0.78  --   --   --  0.69  --   TROPONINIHS 7  --  7  --   --   --   --   --   --   --   --    < > = values in this interval not displayed.    Estimated Creatinine Clearance: 86.6 mL/min (by C-G formula based on SCr of 0.69 mg/dL).   Medical History: Past Medical History:  Diagnosis Date  . BPH associated with nocturia   . Chronic constipation   . COPD (chronic obstructive pulmonary disease) ([place])   . Eczema   . Heart murmur    at birth  . History of adenomatous polyp of colon   . History of basal cell carcinoma (BCC) of skin    post excision from trunk  . 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 20 years ago , 1999, caused by blood clot after knee scope"  DVT to PE  . History of depression    over 40 years ago nervous breakdown  . History of DVT (deep vein thrombosis)    11-06-2017 per pt and wife happened lower leg approx. 1999 after knee  scope  . History of pulmonary embolus (PE)    11-06-2017 from DVT in approx. 1999 per wife and pt  . HOH (hard of hearing)   . Hyperlipidemia   . Hypertension    followed by pcp  . Long term (current) use of anticoagulants Coumadin-- followed by Pharmasist w/ [facility]   secondary to primary hypercoagulopathy w/ hx DVT and PE  . Myalgia and myositis, unspecified   . OA (osteoarthritis)    "all joints"  . PAF (paroxysmal atrial fibrillation) ([place]) 2019  . Peripheral neuropathy   . Pinched nerve in neck    per pt causes intermittant numbness bilateral arms and hands  . Primary hypercoagulable state ([place])   . Prostate cancer [facility]) urologist-  dr Alyson Ingles  oncologist-- dr Tammi Klippel   dx 08-28-2017  Stage T1c, Gleason 4+4, PSA 14.1, vol 42.8cc-- planned treatment external radiation and ADT  . 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 Protein S deficiency ([place])   . Right bundle branch block   . Seborrheic dermatitis, unspecified   . Stroke ([place])   . Type 2 diabetes mellitus ([place])   . Urinary frequency   . Wears glasses     Medications:  Warfarin 17.5 mg daily except 15 mg daily on Tuesday/Thursday Lovenox 100 mg q 12 h bridging  Assessment: 79 y/o M with a h/o PE/DVT, PAF, hypercoagulability with protein C and S deficiency who is  s/p TKA 5/21. Patient was discharged 5/24 but had to call EMS as his knee buckled after getting out of car at his house.   Last dose of Lovenox was 5/24 AM Last dose of Coumadin was 5/23 PM, INR 1.3 (5/24)  Of note, hemoglobin was 7.4 on 5/24 (from 11.6 5/12), expected per Dr. Ricard Dillon ortho. PRBC x 1 given.  Today, 02/16/2020: - INR remains sub-therapeutic at 1.5 - cbc low but relatively stable  Goal of Therapy:  INR 2-3 Monitor platelets by anticoagulation protocol: Yes   Plan:  - Warfarin 17.5 mg PO x1 today  - Continue Lovenox 100 mg q 12 hours - Daily PT/INR  Dia Sitter, PharmD, BCPS 02/16/2020 11:56 AM

## 2020-02-16 NOTE — Progress Notes (Signed)
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 Patient is s/p left total knee arthroplasty secondary to severe osteoarthritis on 5/21 and was just discharged to go home 5/24.He was getting out of car and trying towalk up acurb when he felt his knee buckle from under him but did not fall. Noted slight lightheaded but no chest or shortness of breath.Reportedly hypotensive with systolics of 0000000 on EMS arrival. Normotensive here in the ED. LABS: No leukocytosis, Hgb of 7.4 from a prior of 11.6. N of 129, glucose of 166, creatinine normal at 0.98. INR of 1.3 currently on Lovenox bridge.CT head and CTA chest negative. ED physician discussed with orthopedic Dr. Ricard Dillon and they feel drop inhemoglobin is expected and was admitted for symptomatic anemia physical deconditioning   Subjective: This morning patient feels better.  He is interested to go to skilled nursing facility.  Pain is controlled.  No chest pain nausea vomiting.   Assessment & Plan:  Symptomatic anemia due to acute blood loss with postop anemia with total left knee arthroplasty with left knee swelling.  He had TKA on 5/21.  Baseline hb ~ 11 gm  ,ay 12. On admission  7.4 gm on 5/24.  With 1 unit PRBC hemoglobin has improved to lower 8 g.  Monitor.  Will hold off on further blood transfusion unless hemoglobin down trends further, monitor h/h in am. Recent Labs  Lab 02/15/20 0256 02/15/20 1118 02/15/20 1835 02/16/20 0231 02/16/20 1059  HGB 7.9* 8.2* 8.2* 7.9* 8.0*  HCT 24.6* 26.1* 26.1* 25.5* 25.8*   Severe osteoarthritis of  left knee status post TKA 5/21.  Continue weightbearing status PT OT as per orthopedic.    Severe deconditioning: PT OT, patient feels that he will be able to be managed at home with wife looking into skilled nursing facility.  Continue PT OT.  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  INR subtherapeutic continue to dose Coumadin per pharmacy.  With lovenox bridge.  DM sugars are stable on sliding scale insulin.   Recent Labs  Lab 02/15/20 1142 02/15/20 1646 02/15/20 2125 02/16/20 0755 02/16/20 1202  GLUCAP 115* 102* 106* 104* 96   Lab Results  Component Value Date   HGBA1C 6.7 (H) 02/01/2020   PAF rate controlled continue anticoagulation.  HLD continue statin  Hyponatremia-sodium is still low monitor.  Encourage oral hydration.  Morbid obesity with BMI 42.  Will benefit without weight loss PCP follow-up  DVT prophylaxis:lovenox/Coumadin Code Status:full Family Communication: plan of care discussed with patient at bedside.  Status is: Inpatient Remains inpatient appropriate because:Ongoing active pain requiring inpatient pain management, Unsafe d/c plan .  Monitor H&H next 24 hours prior  while patient is on Coumadin/Lovenox.  Dispo: The patient is from: Home              Anticipated d/c is to: SNF discussed with case manager looking into SNF.              Anticipated d/c date is: 1 day  Patient currently is medically stable for discharge  Diet Order            Diet Heart Room service appropriate? Yes; Fluid consistency: Thin  Diet effective now              Body mass index is 42.57 kg/m. Consultants:see note  Procedures:see note Microbiology:see note  Medications: Scheduled Meds: . sodium chloride   Intravenous Once  . alfuzosin  10 mg Oral Q breakfast  .  enoxaparin (LOVENOX) injection  100 mg Subcutaneous Q12H  . insulin aspart  0-9 Units Subcutaneous TID WC  . senna-docusate  1 tablet Oral QHS  . simvastatin  20 mg Oral Daily  . warfarin  17.5 mg Oral ONCE-1600  . Warfarin - Pharmacist Dosing Inpatient   Does not apply q1600   Continuous Infusions:  Antimicrobials: Anti-infectives (From admission, onward)   None       Objective: Vitals: Today's Vitals   02/15/20 2313 02/16/20 0013 02/16/20 0559 02/16/20 1359  BP:   138/61 127/65  Pulse:   67 64  Resp:   20 19  Temp:   98.2 F (36.8 C) 97.9 F (36.6 C)  TempSrc:   Oral Oral  SpO2:   97% 98%  Weight:      Height:      PainSc: 6  Asleep      Intake/Output Summary (Last 24 hours) at 02/16/2020 1421 Last data filed at 02/16/2020 0304 Gross per 24 hour  Intake 360 ml  Output 1525 ml  Net -1165 ml   Filed Weights   02/13/20 1817 02/14/20 0325  Weight: 106.1 kg 112.5 kg   Weight change:    Intake/Output from previous day: 05/26 0701 - 05/27 0700 In: 720 [P.O.:720] Out: 1775 [Urine:1775] Intake/Output this shift: No intake/output data recorded.  Examination: General exam: AAOx3, morbidly obese,NAD, weak appearing. HEENT:Oral mucosa moist, Ear/Nose WNL grossly, dentition normal. Respiratory system: bilaterally clear,no wheezing or crackles,no use of accessory muscle Cardiovascular system: S1 & S2 +, No JVD,. Gastrointestinal system: Abdomen soft, NT,ND, BS+ Nervous System:Alert, awake, moving extremities and grossly nonfocal Extremities: Left knee with dressing intact, with bruise and swelling of left knee  Area more than the right knee.  Skin: No rashes,no icterus. MSK: Normal muscle bulk,tone, power  Data Reviewed: I have personally reviewed following labs and imaging studies CBC: Recent Labs  Lab 02/13/20 1927 02/13/20 1927 02/14/20 0815 02/14/20 1207 02/15/20 0256 02/15/20 1118 02/15/20 1835 02/16/20 0231 02/16/20 1059  WBC 8.9  --  7.5  --  7.1   --   --  7.0  --   NEUTROABS 7.3  --   --   --   --   --   --   --   --   HGB 7.4*   < > 7.7*   < > 7.9* 8.2* 8.2* 7.9* 8.0*  HCT 23.0*   < > 24.1*   < > 24.6* 26.1* 26.1* 25.5* 25.8*  MCV 87.1  --  87.6  --  88.2  --   --  89.2  --   PLT 196  --  202  --  211  --   --  219  --    < > = values in this interval not displayed.   Basic Metabolic Panel: Recent Labs  Lab 02/13/20 1927 02/14/20 0815 02/15/20 0256 02/16/20 0231  NA 129* 133* 133* 132*  K 4.6 4.3 4.1 4.1  CL 97* 99 101 99  CO2 25 25  24 23  GLUCOSE 177* 103* 108* 107*  BUN 25* 19 20 18   CREATININE 0.98 0.84 0.78 0.69  CALCIUM 8.3* 8.3* 8.0* 8.2*   GFR: Estimated Creatinine Clearance: 86.6 mL/min (by C-G formula based on SCr of 0.69 mg/dL). Liver Function Tests: Recent Labs  Lab 02/13/20 1927  AST 28  ALT 27  ALKPHOS 66  BILITOT 0.7  PROT 6.0*  ALBUMIN 2.9*   No results for input(s): LIPASE, AMYLASE in the last 168 hours. No results for input(s): AMMONIA in the last 168 hours. Coagulation Profile: Recent Labs  Lab 02/13/20 0319 02/13/20 1927 02/14/20 0815 02/15/20 0256 02/16/20 0231  INR 1.3* 1.3* 1.4* 1.5* 1.5*   Cardiac Enzymes: No results for input(s): CKTOTAL, CKMB, CKMBINDEX, TROPONINI in the last 168 hours. BNP (last 3 results) No results for input(s): PROBNP in the last 8760 hours. HbA1C: No results for input(s): HGBA1C in the last 72 hours. CBG: Recent Labs  Lab 02/15/20 1142 02/15/20 1646 02/15/20 2125 02/16/20 0755 02/16/20 1202  GLUCAP 115* 102* 106* 104* 96   Lipid Profile: No results for input(s): CHOL, HDL, LDLCALC, TRIG, CHOLHDL, LDLDIRECT in the last 72 hours. Thyroid Function Tests: No results for input(s): TSH, T4TOTAL, FREET4, T3FREE, THYROIDAB in the last 72 hours. Anemia Panel: No results for input(s): VITAMINB12, FOLATE, FERRITIN, TIBC, IRON, RETICCTPCT in the last 72 hours. Sepsis Labs: No results for input(s): PROCALCITON, LATICACIDVEN in the last 168  hours.  Recent Results (from the past 240 hour(s))  SARS CORONAVIRUS 2 (TAT 6-24 HRS) Nasopharyngeal Nasopharyngeal Swab     Status: None   Collection Time: 02/07/20  2:55 PM   Specimen: Nasopharyngeal Swab  Result Value Ref Range Status   SARS Coronavirus 2 NEGATIVE NEGATIVE Final    Comment: (NOTE) SARS-CoV-2 target nucleic acids are NOT DETECTED. The SARS-CoV-2 RNA is generally detectable in upper and lower respiratory specimens during the acute phase of infection. Negative results do not preclude SARS-CoV-2 infection, do not rule out co-infections with other pathogens, and should not be used as the sole basis for treatment or other patient management decisions. Negative results must be combined with clinical observations, patient history, and epidemiological information. The expected result is Negative. Fact Sheet for Patients: SugarRoll.be Fact Sheet for Healthcare Providers: https://www.woods-mathews.com/ This test is not yet approved or cleared by the [place] FDA and  has been authorized for detection and/or diagnosis of SARS-CoV-2 by FDA under an Emergency Use Authorization (EUA). This EUA will remain  in effect (meaning this test can be used) for the duration of the COVID-19 declaration under Section 56 4(b)(1) of the Act, 21 U.S.C. section 360bbb-3(b)(1), unless the authorization is terminated or revoked sooner. Performed at [facility], [place] [address], [place], [place] [address]   SARS Coronavirus 2 by RT PCR (hospital order, performed in [facility] hospital lab) Nasopharyngeal Nasopharyngeal Swab     Status: None   Collection Time: 02/14/20 12:05 AM   Specimen: Nasopharyngeal Swab  Result Value Ref Range Status   SARS Coronavirus 2 NEGATIVE NEGATIVE Final    Comment: (NOTE) SARS-CoV-2 target nucleic acids are NOT DETECTED. The SARS-CoV-2 RNA is generally detectable in upper and lower respiratory specimens during  the acute phase of infection. The lowest concentration of SARS-CoV-2 viral copies this assay can detect is 250 copies / mL. A negative result does not preclude SARS-CoV-2 infection and should not be used as the sole basis for treatment or other patient management decisions.  A negative result may occur  with improper specimen collection / handling, submission of specimen other than nasopharyngeal swab, presence of viral mutation(s) within the areas targeted by this assay, and inadequate number of viral copies (<250 copies / mL). A negative result must be combined with clinical observations, patient history, and epidemiological information. Fact Sheet for Patients:   StrictlyIdeas.no Fact Sheet for Healthcare Providers: BankingDealers.co.za This test is not yet approved or cleared  by the [place] FDA and has been authorized for detection and/or diagnosis of SARS-CoV-2 by FDA under an Emergency Use Authorization (EUA).  This EUA will remain in effect (meaning this test can be used) for the duration of the COVID-19 declaration under Section 564(b)(1) of the Act, 21 U.S.C. section 360bbb-3(b)(1), unless the authorization is terminated or revoked sooner. Performed at [facility], [place] [address] [place], [place] [address]       Radiology Studies: No results found.   LOS: 2 days   Antonieta Pert, MD [facility]  02/16/2020, 2:21 PM

## 2020-02-16 NOTE — Progress Notes (Signed)
Subjective:   Patient is 6 days s/p left TKA. He was discharged on Monday but then readmitted Tuesday early morning due to weakness and fatigue when arriving home. His Hemoglobin upon readmittance was 7.0 and was given 1 unit of pRBC. This brought Hgb up to 8.2. He reports feeling better this am, was able to get out of bed and walk to chair yesterday. He reports pain in the knee overnight and had to take some pain medicine for it. Overall feeling better than when admitted.    Objective: Vital signs in last 24 hours: Temp:  [97.4 F (36.3 C)-98.2 F (36.8 C)] 98.2 F (36.8 C) (05/27 0559) Pulse Rate:  [65-67] 67 (05/27 0559) Resp:  [16-20] 20 (05/27 0559) BP: (127-140)/(61-63) 138/61 (05/27 0559) SpO2:  [95 %-97 %] 97 % (05/27 0559)  Intake/Output from previous day: 05/26 0701 - 05/27 0700 In: 720 [P.O.:720] Out: 1775 [Urine:1775] Intake/Output this shift: No intake/output data recorded.  Recent Labs    02/14/20 1831 02/15/20 0256 02/15/20 1118 02/15/20 1835 02/16/20 0231  HGB 8.1* 7.9* 8.2* 8.2* 7.9*   Recent Labs    02/15/20 0256 02/15/20 1118 02/15/20 1835 02/16/20 0231  WBC 7.1  --   --  7.0  RBC 2.79*  --   --  2.86*  HCT 24.6*   < > 26.1* 25.5*  PLT 211  --   --  219   < > = values in this interval not displayed.   Recent Labs    02/15/20 0256 02/16/20 0231  NA 133* 132*  K 4.1 4.1  CL 101 99  CO2 24 23  BUN 20 18  CREATININE 0.78 0.69  GLUCOSE 108* 107*  CALCIUM 8.0* 8.2*   Recent Labs    02/15/20 0256 02/16/20 0231  INR 1.5* 1.5*    Neurologically intact Neurovascular intact Sensation intact distally Intact pulses distally Dorsiflexion/Plantar flexion intact Incision: dressing C/D/I No cellulitis present Compartment soft able to flex and extend at left knee with minimal pain.   Assessment/Plan:   6 days s/p Left TKA - I do not think at this time patient can safely return home with it just being him and his wife. I do think he needs a  SNF placement to continue working on strength. I read previous PT and OT note that both recommend this as well. Ortho agrees.  -Keep dressings clean and on until follow up in the office -Continue PT/OT until discharge. Does not need immobilizer on with ambulation or in bed.  -Continue icing and elevating when in bed.  -Please call with any questions or concerns.    Nettie Elm [facility] [phone] 02/16/2020, 7:25 AM

## 2020-02-16 NOTE — Progress Notes (Signed)
Physical Therapy Treatment Patient Details Name: Colton Miranda MRN: [id number] DOB: 09-Jan-1941 Today's Date: 02/16/2020    History of Present Illness Patient is 79 y.o. male s/p Lt TKA on 02/10/20 with PMH significant for DM, Stroke, protate cancer, PAF, OA, HTN, HLD, HOH, hx of DVT and PE, COPD, Rt THA In 2010. Goltry 5/24 post TKA, knees buckled getting up a curb.Reportedly hypotensive with systolics of 0000000 on EMS arrival. Normotensive in the ED. HGB 7.4, received 1 unit of blood.    PT Comments    POD #6 Assisted OOB to amb.  General bed mobility comments: Max assist to transfer to side of bed - needing assistance for LLE and hand hold to pull patient into sitting and to assist with scooting to edge of bed.  General transfer comment: from elevated surface with increased time.  Static standing x 2 min to void Min Assist for safety.General Gait Details: increased time with cues for sequence, posture and position from RW.  Pt feels "tired".  Limited distance.  recliner following for safety. Then returned to room to perform some TE's following HEP handout.  Instructed on proper tech, freq as well as use of ICE.   Pt will need ST Rehab at SNF prior to  home.    Follow Up Recommendations  SNF     Equipment Recommendations  None recommended by PT    Recommendations for Other Services       Precautions / Restrictions Precautions Precautions: Knee;Fall Precaution Comments: instructed no pillow under knee and elevated for swelling Restrictions LLE Weight Bearing: Weight bearing as tolerated    Mobility  Bed Mobility Overal bed mobility: Needs Assistance Bed Mobility: Supine to Sit     Supine to sit: Max assist;HOB elevated     General bed mobility comments: Max assist to transfer to side of bed - needing assistance for LLE and hand hold to pull patient into sitting and to assist with scooting to edge of bed  Transfers Overall transfer level: Needs assistance Equipment used:  Rolling walker (2 wheeled) Transfers: Sit to/from Stand Sit to Stand: Mod assist;Min assist         General transfer comment: from elevated surface with increased time.  Static standing x 2 min to void Min Assist for safety.  Ambulation/Gait Ambulation/Gait assistance: Min guard;+2 safety/equipment Gait Distance (Feet): 16 Feet Assistive device: Rolling walker (2 wheeled) Gait Pattern/deviations: Step-through pattern;Decreased stride length Gait velocity: decreased   General Gait Details: increased time with cues for sequence, posture and position from RW.  Pt feels "tired".  Limited distance.  recliner following for safety.   Stairs             Wheelchair Mobility    Modified Rankin (Stroke Patients Only)       Balance                                            Cognition Arousal/Alertness: Awake/alert Behavior During Therapy: WFL for tasks assessed/performed Overall Cognitive Status: Within Functional Limits for tasks assessed                                 General Comments: AxO x 4 feels "tired"      Exercises   Total Knee Replacement TE's following HEP handout 10 reps B LE ankle  pumps 05 reps towel squeezes 05 reps knee presses 05 reps heel slides  05 reps SAQ's 05 reps SLR's 05 reps ABD Educated on use of gait belt to assist with TE's Followed by ICE     General Comments        Pertinent Vitals/Pain Pain Assessment: 0-10 Pain Score: 4  Pain Location: Lt knee Pain Descriptors / Indicators: Aching;Sore;Operative site guarding;Tender;Tightness Pain Intervention(s): Monitored during session;Patient requesting pain meds-RN notified;Ice applied;Repositioned    Home Living                      Prior Function            PT Goals (current goals can now be found in the care plan section) Progress towards PT goals: Progressing toward goals    Frequency    7X/week      PT Plan Current plan  remains appropriate    Co-evaluation              AM-PAC PT "6 Clicks" Mobility   Outcome Measure  Help needed turning from your back to your side while in a flat bed without using bedrails?: A Little Help needed moving from lying on your back to sitting on the side of a flat bed without using bedrails?: A Little Help needed moving to and from a bed to a chair (including a wheelchair)?: A Lot Help needed standing up from a chair using your arms (e.g., wheelchair or bedside chair)?: A Lot Help needed to walk in hospital room?: A Lot Help needed climbing 3-5 steps with a railing? : Total 6 Click Score: 13    End of Session Equipment Utilized During Treatment: Gait belt Activity Tolerance: Patient limited by fatigue Patient left: in chair;with call bell/phone within reach;with chair alarm set Nurse Communication: Mobility status PT Visit Diagnosis: Muscle weakness (generalized) (M62.81);Difficulty in walking, not elsewhere classified (R26.2);History of falling (Z91.81)     Time: [id number] PT Time Calculation (min) (ACUTE ONLY): 23 min  Charges:  $Gait Training: 8-22 mins $Therapeutic Exercise: 8-22 mins                     {Bryley Kovacevic  PTA Acute  Rehabilitation Services Pager      [phone] Office      [phone]

## 2020-02-16 NOTE — TOC Progression Note (Signed)
Transition of Care [facility]) - Progression Note    Patient Details  Name: JEPSON GAZZOLA MRN: [id number] Date of Birth: 06-10-1941  Transition of Care [facility]) CM/SW Contact  Purcell Mouton, RN Phone Number: 02/16/2020, 11:01 AM  Clinical Narrative:    Authorization was started with [employer]. Pt was also faxed to out SNF. Waiting for MD to sign FL2.    Expected Discharge Plan: [place] Barriers to Discharge: No Barriers Identified  Expected Discharge Plan and Services Expected Discharge Plan: [place]   Discharge Planning Services: CM Consult   Living arrangements for the past 2 months: Single Family Home                                       Social Determinants of Health (SDOH) Interventions    Readmission Risk Interventions No flowsheet data found.

## 2020-02-16 NOTE — TOC Progression Note (Signed)
Transition of Care [facility]) - Progression Note    Patient Details  Name: Colton Miranda MRN: [id number] Date of Birth: Aug 27, 1941  Transition of Care [facility]) CM/SW Contact  Purcell Mouton, RN Phone Number: 02/16/2020, 3:30 PM  Clinical Narrative:     Epic went down HTA could not access chart. Clinicals faxed to [employer]. However, may need peer to peer for SNF. Spoke with pt's wife who states that if insurance don't pay, pt will not be able to go. List given to pt's wife over the telephone. Waiting for insurance.   Expected Discharge Plan: [place] Barriers to Discharge: No Barriers Identified  Expected Discharge Plan and Services Expected Discharge Plan: [place]   Discharge Planning Services: CM Consult   Living arrangements for the past 2 months: Single Family Home                                       Social Determinants of Health (SDOH) Interventions    Readmission Risk Interventions No flowsheet data found.

## 2020-02-16 NOTE — TOC Progression Note (Signed)
Transition of Care [facility]) - Progression Note    Patient Details  Name: Colton Miranda MRN: [id number] Date of Birth: [birth year]  Transition [facility]) CM/SW Contact  Purcell Mouton, RN Phone Number: 02/16/2020, 10:55 AM  Clinical Narrative:    Spoke with pt who know states he want to go to a SNF. A call to start authorization with [employer] was made.   Expected Discharge Plan: [place] Barriers to Discharge: No Barriers Identified  Expected Discharge Plan and Services Expected Discharge Plan: [place]   Discharge Planning Services: CM Consult   Living arrangements for the past 2 months: Single Family Home                                       Social Determinants of Health (SDOH) Interventions    Readmission Risk Interventions No flowsheet data found.

## 2020-02-16 NOTE — Plan of Care (Signed)
  Problem: Education: Goal: Knowledge of condition and prescribed therapy will improve Outcome: Progressing   Problem: Clinical Measurements: Goal: Diagnostic test results will improve Outcome: Progressing Goal: Respiratory complications will improve Outcome: Progressing Goal: Cardiovascular complication will be avoided Outcome: Progressing   Problem: Activity: Goal: Risk for activity intolerance will decrease Outcome: Progressing   Problem: Nutrition: Goal: Adequate nutrition will be maintained Outcome: Progressing   Problem: Coping: Goal: Level of anxiety will decrease Outcome: Progressing   Problem: Pain Managment: Goal: General experience of comfort will improve Outcome: Progressing   Problem: Safety: Goal: Ability to remain free from injury will improve Outcome: Progressing

## 2020-02-16 NOTE — NC FL2 (Signed)
[place] LEVEL OF CARE SCREENING TOOL     IDENTIFICATION  Patient Name: Colton Miranda Birthdate: [birth year] Sex: male Admission Date (Current Location): 02/13/2020  [facility] and [place] Number:  Herbalist and Address:  [facility],  [place] [address] [place]      Provider Number: [id number]  Attending Physician Name and Address:  Antonieta Pert, MD  Relative Name and Phone Number:  Talha Quinley, wife [phone]    Current Level of Care: Hospital Recommended Level of Care: [place] Prior Approval Number:    Date Approved/Denied:   PASRR Number: [id number]  Discharge Plan: SNF    Current Diagnoses: Patient Active Problem List   Diagnosis Date Noted  . Anemia 02/14/2020  . Hyponatremia 02/14/2020  . S/P total knee arthroplasty, left 02/14/2020  . Osteoarthritis of left knee 02/10/2020  . Contact dermatitis 01/19/2020  . PAF (paroxysmal atrial fibrillation) ([place]) 04/14/2018  . Malignant neoplasm of prostate ([place]) 10/07/2017  . Colon polyps 11/26/2016  . Influenza vaccination declined 05/28/2016  . History of fall 07/25/2015  . Palpitations 11/08/2014  . Pain of left arm 11/08/2014  . Pain in joint, lower leg 02/28/2014  . Essential hypertension 02/28/2014  . Disturbance of skin sensation 02/23/2013  . Insomnia, unspecified 02/23/2013  . Obesity   . DM (diabetes mellitus), secondary, uncontrolled, with peripheral vascular complications ([place])   . Hyperlipemia   . Urinary frequency   . Primary hypercoagulable state ([place]) 01/03/2013  . Other pulmonary embolism and infarction 01/03/2013  . Long term current use of anticoagulant therapy 01/03/2013    Orientation RESPIRATION BLADDER Height & Weight     Self, Time, Situation, Place  Normal Continent Weight: 112.5 kg Height:  5\' 4"  (162.6 cm)  BEHAVIORAL SYMPTOMS/MOOD NEUROLOGICAL BOWEL NUTRITION STATUS      Continent Diet(Heart Healthy)   AMBULATORY STATUS COMMUNICATION OF NEEDS Skin   Extensive Assist Verbally Surgical wounds(5/21 Left total knee incision, ecchymosis elbow, LLE swelling)                       Personal Care Assistance Level of Assistance  Bathing, Feeding, Dressing Bathing Assistance: Limited assistance Feeding assistance: Independent Dressing Assistance: Limited assistance     Functional Limitations Info  Sight, Hearing, Speech Sight Info: Impaired Hearing Info: Impaired Speech Info: Adequate    SPECIAL CARE FACTORS FREQUENCY                       Contractures Contractures Info: Not present    Additional Factors Info  Code Status, Allergies Code Status Info: FULL Allergies Info: No Known Allergies           Current Medications (02/16/2020):  This is the current hospital active medication list Current Facility-Administered Medications  Medication Dose Route Frequency Provider Last Rate Last Admin  . 0.9 %  sodium chloride infusion (Manually program via Guardrails IV Fluids)   Intravenous Once Tu, Ching T, DO      . alfuzosin (UROXATRAL) 24 hr tablet 10 mg  10 mg Oral Q breakfast Tu, Ching T, DO   10 mg at 02/15/20 1207  . enoxaparin (LOVENOX) injection 100 mg  100 mg Subcutaneous Q12H Tu, Ching T, DO   100 mg at 02/15/20 2313  . insulin aspart (novoLOG) injection 0-9 Units  0-9 Units Subcutaneous TID WC Tu, Ching T, DO      . senna-docusate (Senokot-S) tablet 1 tablet  1 tablet  Oral QHS Tu, Ching T, DO      . simvastatin (ZOCOR) tablet 20 mg  20 mg Oral Daily Tu, Ching T, DO   20 mg at 02/15/20 1207  . traMADol (ULTRAM) tablet 50-100 mg  50-100 mg Oral Q6H PRN Tu, Ching T, DO   50 mg at 02/15/20 2313  . Warfarin - Pharmacist Dosing Inpatient   Does not apply q1600 Ileene Musa T, DO         Discharge Medications: Please see discharge summary for a list of discharge medications.  Relevant Imaging Results:  Relevant Lab Results:   Additional  Information SS#[id number]  Purcell Mouton, RN

## 2020-02-17 DIAGNOSIS — R5381 Other malaise: Secondary | ICD-10-CM | POA: Diagnosis not present

## 2020-02-17 DIAGNOSIS — M2550 Pain in unspecified joint: Secondary | ICD-10-CM | POA: Diagnosis not present

## 2020-02-17 DIAGNOSIS — D6859 Other primary thrombophilia: Secondary | ICD-10-CM | POA: Diagnosis not present

## 2020-02-17 DIAGNOSIS — E0841 Diabetes mellitus due to underlying condition with diabetic mononeuropathy: Secondary | ICD-10-CM | POA: Diagnosis not present

## 2020-02-17 DIAGNOSIS — I480 Paroxysmal atrial fibrillation: Secondary | ICD-10-CM | POA: Diagnosis not present

## 2020-02-17 DIAGNOSIS — D6489 Other specified anemias: Secondary | ICD-10-CM | POA: Diagnosis not present

## 2020-02-17 DIAGNOSIS — E7849 Other hyperlipidemia: Secondary | ICD-10-CM | POA: Diagnosis not present

## 2020-02-17 DIAGNOSIS — R293 Abnormal posture: Secondary | ICD-10-CM | POA: Diagnosis not present

## 2020-02-17 DIAGNOSIS — Z471 Aftercare following joint replacement surgery: Secondary | ICD-10-CM | POA: Diagnosis not present

## 2020-02-17 DIAGNOSIS — I469 Cardiac arrest, cause unspecified: Secondary | ICD-10-CM | POA: Diagnosis not present

## 2020-02-17 DIAGNOSIS — R262 Difficulty in walking, not elsewhere classified: Secondary | ICD-10-CM | POA: Diagnosis not present

## 2020-02-17 DIAGNOSIS — R0989 Other specified symptoms and signs involving the circulatory and respiratory systems: Secondary | ICD-10-CM | POA: Diagnosis not present

## 2020-02-17 DIAGNOSIS — Z5181 Encounter for therapeutic drug level monitoring: Secondary | ICD-10-CM | POA: Diagnosis not present

## 2020-02-17 DIAGNOSIS — R2681 Unsteadiness on feet: Secondary | ICD-10-CM | POA: Diagnosis not present

## 2020-02-17 DIAGNOSIS — R41841 Cognitive communication deficit: Secondary | ICD-10-CM | POA: Diagnosis not present

## 2020-02-17 DIAGNOSIS — I700 Atherosclerosis of aorta: Secondary | ICD-10-CM | POA: Diagnosis not present

## 2020-02-17 DIAGNOSIS — D649 Anemia, unspecified: Secondary | ICD-10-CM | POA: Diagnosis not present

## 2020-02-17 DIAGNOSIS — R52 Pain, unspecified: Secondary | ICD-10-CM | POA: Diagnosis not present

## 2020-02-17 DIAGNOSIS — Z96652 Presence of left artificial knee joint: Secondary | ICD-10-CM | POA: Diagnosis not present

## 2020-02-17 DIAGNOSIS — Z9181 History of falling: Secondary | ICD-10-CM | POA: Diagnosis not present

## 2020-02-17 DIAGNOSIS — Z713 Dietary counseling and surveillance: Secondary | ICD-10-CM | POA: Diagnosis not present

## 2020-02-17 DIAGNOSIS — D689 Coagulation defect, unspecified: Secondary | ICD-10-CM | POA: Diagnosis not present

## 2020-02-17 DIAGNOSIS — M6281 Muscle weakness (generalized): Secondary | ICD-10-CM | POA: Diagnosis not present

## 2020-02-17 DIAGNOSIS — R42 Dizziness and giddiness: Secondary | ICD-10-CM | POA: Diagnosis not present

## 2020-02-17 DIAGNOSIS — Z4789 Encounter for other orthopedic aftercare: Secondary | ICD-10-CM | POA: Diagnosis not present

## 2020-02-17 DIAGNOSIS — E871 Hypo-osmolality and hyponatremia: Secondary | ICD-10-CM | POA: Diagnosis not present

## 2020-02-17 DIAGNOSIS — Z7901 Long term (current) use of anticoagulants: Secondary | ICD-10-CM | POA: Diagnosis not present

## 2020-02-17 DIAGNOSIS — Z7401 Bed confinement status: Secondary | ICD-10-CM | POA: Diagnosis not present

## 2020-02-17 DIAGNOSIS — Z8546 Personal history of malignant neoplasm of prostate: Secondary | ICD-10-CM | POA: Diagnosis not present

## 2020-02-17 DIAGNOSIS — L0591 Pilonidal cyst without abscess: Secondary | ICD-10-CM | POA: Diagnosis not present

## 2020-02-17 LAB — CBC
HCT: 25.7 % — ABNORMAL LOW (ref 39.0–52.0)
Hemoglobin: 8.1 g/dL — ABNORMAL LOW (ref 13.0–17.0)
MCH: 27.8 pg (ref 26.0–34.0)
MCHC: 31.5 g/dL (ref 30.0–36.0)
MCV: 88.3 fL (ref 80.0–100.0)
Platelets: 235 10*3/uL (ref 150–400)
RBC: 2.91 MIL/uL — ABNORMAL LOW (ref 4.22–5.81)
RDW: 14.4 % (ref 11.5–15.5)
WBC: 7.7 10*3/uL (ref 4.0–10.5)
nRBC: 0 % (ref 0.0–0.2)

## 2020-02-17 LAB — BASIC METABOLIC PANEL
Anion gap: 8 (ref 5–15)
BUN: 19 mg/dL (ref 8–23)
CO2: 27 mmol/L (ref 22–32)
Calcium: 8.3 mg/dL — ABNORMAL LOW (ref 8.9–10.3)
Chloride: 101 mmol/L (ref 98–111)
Creatinine, Ser: 0.78 mg/dL (ref 0.61–1.24)
GFR calc Af Amer: >60 mL/min (ref >60)
GFR calc non Af Amer: >60 mL/min (ref >60)
Glucose, Bld: 101 mg/dL — ABNORMAL HIGH (ref 70–99)
Potassium: 4.4 mmol/L (ref 3.5–5.1)
Sodium: 136 mmol/L (ref 135–145)

## 2020-02-17 LAB — GLUCOSE, CAPILLARY
Glucose-Capillary: 83 mg/dL (ref 70–99)
Glucose-Capillary: 160 mg/dL — ABNORMAL HIGH (ref 70–99)

## 2020-02-17 LAB — PROTIME-INR
INR: 1.7 — ABNORMAL HIGH (ref 0.8–1.2)
Prothrombin Time: 19.3 seconds — ABNORMAL HIGH (ref 11.4–15.2)

## 2020-02-17 MED ORDER — ENOXAPARIN SODIUM 100 MG/ML ~~LOC~~ SOLN: 100.0000 mg | Freq: Two times a day (BID) | SUBCUTANEOUS | Status: DC

## 2020-02-17 MED ORDER — WARFARIN SODIUM 5 MG PO TABS: 17.5000 mg | ORAL_TABLET | Freq: Once | ORAL | Status: DC

## 2020-02-17 MED ORDER — TRAMADOL HCL 50 MG PO TABS: ORAL_TABLET | ORAL | 0 refills | Status: AC

## 2020-02-17 MED ORDER — OXYCODONE HCL 5 MG PO TABS: 5.0000 mg | ORAL_TABLET | ORAL | 0 refills | Status: DC | PRN

## 2020-02-17 NOTE — Progress Notes (Signed)
Subjective:   Patient is 7 days s/p left TKA. He was discharged on Monday but then readmitted Tuesday early morning due to weakness and fatigue when arriving home. His Hemoglobin upon readmittance was 7.0 and was given 1 unit of pRBC. This brought Hgb up to 8.2.   Today patient is doing well. He was able to get up with therapy yesterday and walk down the hall. He reports feeling well today. No events overnight. We are currently waiting for placement at a SNF facility.   Objective: Vital signs in last 24 hours: Temp:  [97.9 F (36.6 C)-98.6 F (37 C)] 98.4 F (36.9 C) (05/28 0536) Pulse Rate:  [56-64] 56 (05/28 0536) Resp:  [19-20] 20 (05/28 0536) BP: (121-136)/(54-65) 136/60 (05/28 0536) SpO2:  [96 %-98 %] 96 % (05/28 0536)  Intake/Output from previous day: 05/27 0701 - 05/28 0700 In: -  Out: 675 [Urine:675] Intake/Output this shift: No intake/output data recorded.  Recent Labs    02/15/20 1118 02/15/20 1835 02/16/20 0231 02/16/20 1059 02/17/20 0416  HGB 8.2* 8.2* 7.9* 8.0* 8.1*   Recent Labs    02/16/20 0231 02/16/20 0231 02/16/20 1059 02/17/20 0416  WBC 7.0  --   --  7.7  RBC 2.86*  --   --  2.91*  HCT 25.5*   < > 25.8* 25.7*  PLT 219  --   --  235   < > = values in this interval not displayed.   Recent Labs    02/16/20 0231 02/17/20 0416  NA 132* 136  K 4.1 4.4  CL 99 101  CO2 23 27  BUN 18 19  CREATININE 0.69 0.78  GLUCOSE 107* 101*  CALCIUM 8.2* 8.3*   Recent Labs    02/16/20 0231 02/17/20 0416  INR 1.5* 1.7*    Neurologically intact Neurovascular intact Sensation intact distally Intact pulses distally Dorsiflexion/Plantar flexion intact Incision: dressing C/D/I No cellulitis present Compartment soft able to flex and extend at left knee with minimal pain.   Assessment/Plan:  Left knee OA:  7 days s/p Left TKA -Keep dressings clean and on until follow up in the office -Continue PT/OT until discharge. Does not need immobilizer on with  ambulation or in bed.  -Continue icing and elevating when in bed.  -Please call with any questions or concerns.  -waiting SNF placement -We will continue to follow patient while in the hospital   Nettie Elm [facility] [phone] 02/17/2020, 10:32 AM

## 2020-02-17 NOTE — Progress Notes (Signed)
   02/17/20 1305  AVS Discharge Documentation  AVS Discharge Instructions Including Medications Placed in discharge packet for receiving facility  Name of Person Receiving AVS Discharge Instructions Including Medications Colton Miranda, [facility]  Name of Clinician That Reviewed AVS Discharge Instructions Including Medications Rich Reining

## 2020-02-17 NOTE — TOC Transition Note (Signed)
Transition of Care [facility]) - CM/SW Discharge Note   Patient Details  Name: Colton Miranda MRN: [id number] Date of Birth: 1941/09/15  [facility] CM/SW Contact:  Lennart Pall, LCSW Phone Number: 02/17/2020, 12:34 PM   Clinical Narrative:   Have received SNF bed offer from [facility] and pt/ wife have accepted.  MD has cleared for tf today.  RN aware and Corey Harold has been called. No further TOC needs.    Final next level of care: Skilled Nursing Facility Barriers to Discharge: Barriers Resolved   Patient Goals and CMS Choice Patient states their goals for this hospitalization and ongoing recovery are:: To get better   Choice offered to / list presented to : Spouse  Discharge Placement              Patient [facility]: [facility] Patient to be transferred to facility by: [place] Name of family member notified: wife, Threasa Beards Patient and family notified of of transfer: 02/17/20  Discharge Plan and Services   Discharge Planning Services: CM Consult            DME Arranged: N/A DME Agency: NA       HH Arranged: NA HH Agency: NA        Social Determinants of Health (SDOH) Interventions     Readmission Risk Interventions No flowsheet data found.

## 2020-02-17 NOTE — Progress Notes (Signed)
[facility] for Warfarin and Lovenox Indication: history of PE/DVT, hypercoagulability with protein C and S deficiency and PAF  No Known Allergies  Patient Measurements: Height: 5\' 4"  (162.6 cm) Weight: 112.5 kg (248 lb 0.3 oz) IBW/kg (Calculated) : 59.2  Vital Signs: Temp: 98.4 F (36.9 C) (05/28 0536) Temp Source: Oral (05/28 0536) BP: 136/60 (05/28 0536) Pulse Rate: 56 (05/28 0536)  Labs: Recent Labs    02/15/20 0256 02/15/20 1118 02/16/20 0231 02/16/20 0231 02/16/20 1059 02/17/20 0416  HGB 7.9*   < > 7.9*   < > 8.0* 8.1*  HCT 24.6*   < > 25.5*  --  25.8* 25.7*  PLT 211  --  219  --   --  235  LABPROT 17.6*  --  17.5*  --   --  19.3*  INR 1.5*  --  1.5*  --   --  1.7*  CREATININE 0.78  --  0.69  --   --  0.78   < > = values in this interval not displayed.    Estimated Creatinine Clearance: 86.6 mL/min (by C-G formula based on SCr of 0.78 mg/dL).   Medical History: Past Medical History:  Diagnosis Date  . BPH associated with nocturia   . Chronic constipation   . COPD (chronic obstructive pulmonary disease) ([place])   . Eczema   . Heart murmur    at birth  . History of adenomatous polyp of colon   . History of basal cell carcinoma (BCC) of skin    post excision from trunk  . 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 20 years ago , 1999, caused by blood clot after knee scope"  DVT to PE  . History of depression    over 40 years ago nervous breakdown  . History of DVT (deep vein thrombosis)    11-06-2017 per pt and wife happened lower leg approx. 1999 after knee scope  . History of pulmonary embolus (PE)    11-06-2017 from DVT in approx. 1999 per wife and pt  . HOH (hard of hearing)   . Hyperlipidemia   . Hypertension    followed by pcp  . Long term (current) use of anticoagulants Coumadin-- followed by Pharmasist w/ [facility]   secondary to primary hypercoagulopathy w/ hx DVT and PE  . Myalgia and myositis, unspecified   . OA (osteoarthritis)    "all joints"  . PAF (paroxysmal atrial fibrillation) ([place]) 2019  . Peripheral neuropathy   . Pinched nerve in neck    per pt causes intermittant numbness bilateral arms and hands  . Primary hypercoagulable state ([place])   . Prostate cancer [facility]) urologist-  dr Alyson Ingles  oncologist-- dr Tammi Klippel   dx 08-28-2017  Stage T1c, Gleason 4+4, PSA 14.1, vol 42.8cc-- planned treatment external radiation and ADT  . 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 Protein S deficiency ([place])   . Right bundle branch block   . Seborrheic dermatitis, unspecified   . Stroke ([place])   . Type 2 diabetes mellitus ([place])   . Urinary frequency   . Wears glasses     Medications:  Warfarin 17.5 mg daily except 15 mg daily on Tuesday/Thursday Lovenox 100 mg q 12 h bridging  Assessment: 79 y/o M with a h/o PE/DVT, PAF, hypercoagulability with protein C and S deficiency who is  s/p TKA 5/21. Patient was discharged 5/24 but had to  call EMS as his knee buckled after getting out of car at his house.   Last dose of Lovenox was 5/24 AM Last dose of Coumadin was 5/23 PM, INR 1.3 (5/24)  Of note, hemoglobin was 7.4 on 5/24 (from 11.6 5/12), expected per Dr. Ricard Dillon ortho. PRBC x 1 given.  Today, 02/17/2020: - INR remains sub-therapeutic at 1.7 - CBC low but relatively stable - SCr stable  Goal of Therapy:  INR 2-3 Monitor platelets by anticoagulation protocol: Yes   Plan:  - Warfarin 17.5 mg PO x1 today  - Continue Lovenox 100 mg q 12 hours - Daily PT/INR  Peggyann Juba, PharmD, BCPS Pharmacy: [phone] 02/17/2020 7:04 AM

## 2020-02-17 NOTE — Discharge Summary (Signed)
Physician Discharge Summary  Colton Miranda ZMO:[id number] DOB: 17-Dec-1940 DOA: 02/13/2020  PCP: Marton Redwood, MD  Admit date: 02/13/2020 Discharge date: 02/17/2020  Admitted From: home Disposition:  SNF  Recommendations for Outpatient Follow-up:  1. Follow up with PCP in 1-2 weeks 2. Please obtain BMP/CBC in one week 3. Follow-up daily INR and stop Lovenox once INR therapeutic at 2-3 4. Please follow up on the following pending results:  Home Health:NO  Equipment/Devices: NONE  Discharge Condition: Stable Code Status: FULL Diet recommendation:  Diet Order            Diet - low sodium heart healthy        Diet Carb Modified        Diet Heart Room service appropriate? 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 Patient is s/p left total knee arthroplasty secondary to severe osteoarthritis on 5/21 and was just discharged to go home 5/24.He was getting out of car and trying towalk up acurb when he felt his knee buckle from under him but did not fall. Noted slight lightheaded but no chest or shortness of breath.Reportedly hypotensive with systolics of 03T on EMS arrival. Normotensive here in the ED. LABS: No leukocytosis, Hgb of 7.4 from a prior of 11.6. N of 129, glucose of 166, creatinine normal at 0.98. INR of 1.3 currently on Lovenox bridge.CT head and CTA chest negative. ED physician discussed with orthopedic Dr. Ricard Dillon and they feel drop inhemoglobin is expected and was admitted for symptomatic anemia physical deconditioning  Patient was transfused 1 interrogation.  Patient  Feels well blood transfusions but is deconditioned  weak and awaiting on snf. Patient is medically stable and is being discharged to skilled nursing facility today.   Discharge Diagnoses:  Assessment & Plan:  Symptomatic anemia due to acute blood loss 2.2 Lt TKA on anticoagulation.Baseline hb ~ 11 gm  ,ay 12. On admission  7.4 gm on 5/24. S/p 1 unit prbc and hb up in 8 gm. Stable now. inr coming up. Recent Labs  Lab 02/15/20 1118 02/15/20 1835 02/16/20 0231 02/16/20 1059 02/17/20 0416  HGB 8.2* 8.2* 7.9* 8.0* 8.1*  HCT 26.1* 26.1* 25.5* 25.8* 25.7*   Severe osteoarthritis status post left TKA 5/21.  Continue weightbearing as per orthopedic, continue PT OT pain control..    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  INR subtherapeutic continue to dose Coumadin per pharmacy.  With lovenox bridge until INR is therapeutic at 2 then stop lovenox. Recent Labs  Lab 02/13/20 1927 02/14/20 0815 02/15/20 0256 02/16/20 0231 02/17/20 0416  INR 1.3* 1.4* 1.5* 1.5* 1.7*   DM sugars are stable on sliding scale insulin.   Recent Labs  Lab 02/16/20 1202 02/16/20 1724 02/16/20 2045 02/16/20 2232 02/17/20 0726  GLUCAP 96 102* 122* 105* 83   Lab Results  Component Value Date   HGBA1C 6.7 (H) 02/01/2020  PAF rate controlled continue anticoagulation.  HLD continue statin  Hyponatremia-sodium is still low monitor.  Encourage oral hydration.  Morbid obesity with BMI 42.  Will benefit without weight loss PCP follow-up  Subjective: Having breakfast feels pain on lt? Rt arm from PT as he was trying to bear most of the weight on his Upper Extremities.    Consults:  ORTHOPEDICS   Discharge  Exam: Vitals:   02/16/20 2050 02/17/20 0536  BP: (!) 121/54 136/60  Pulse: (!) 58 (!) 56  Resp: 20 20  Temp: 98.6 F (37 C) 98.4 F (36.9 C)  SpO2: 97% 96%   General: Pt is alert, awake, not in acute distress Cardiovascular: RRR, S1/S2 +, no rubs, no gallops Respiratory: CTA bilaterally, no wheezing, no rhonchi Abdominal: Soft, NT, ND, bowel sounds + Extremities: no edema, no cyanosis  Discharge Instructions  Discharge Instructions    Diet - low sodium heart healthy   Complete by: As directed    Diet Carb Modified   Complete by: As directed    Discharge instructions   Complete by: As directed    Follow-up with your PCP and orthopedic in 1 to 2 weeks  You are on Coumadin and Lovenox and once her INR is 2-3 stop the Lovenox and continue Coumadin and monitor INR regularly.  Please call call MD or return to ER for similar or worsening recurring problem that brought you to hospital or if any fever,nausea/vomiting,abdominal pain, uncontrolled pain, chest pain,  shortness of breath or any other alarming symptoms.  Please follow-up your doctor as instructed in a week time and call the office for appointment.  Please avoid alcohol, smoking, or any other illicit substance and maintain healthy habits including taking your regular medications as prescribed.  You were cared for by a hospitalist during your hospital stay. If you have any questions about your discharge medications or the care you received while you were in the hospital after you are discharged, you can call the unit and ask to speak with the hospitalist on call if the hospitalist that took care of you is not available.  Once you are discharged, your primary care physician will handle any further medical issues. Please note that NO REFILLS for any discharge medications will be authorized once you are discharged, as it is imperative that you return to your primary care physician (or establish a relationship with a primary care  physician if you do not have one) for your aftercare needs so that they can reassess your need for medications and monitor your lab values   Increase activity slowly   Complete by: As directed      Allergies as of 02/17/2020   No Known Allergies     Medication List    TAKE these medications   acetaminophen 500 MG tablet Commonly known as: TYLENOL Take 1,000 mg by mouth every 6 (six) hours as needed for moderate pain.   alfuzosin 10 MG 24 hr tablet Commonly known as: Uroxatral Take 1 tablet (10 mg total) by mouth daily with breakfast.   diclofenac Sodium 1 % Gel Commonly known as: VOLTAREN Apply 1 application topically at bedtime.   diphenhydrAMINE 25 MG tablet Commonly known as: SOMINEX Take 25 mg by mouth at bedtime as needed for sleep.   enoxaparin 100 MG/ML injection Commonly known as: LOVENOX Inject 1 mL (100 mg total) into the skin every 12 (twelve) hours. Stop Lovenox once INR is therapeutic 2-3 What changed: additional instructions   lisinopril 20 MG  tablet Commonly known as: ZESTRIL Take 1 tablet (20 mg total) by mouth daily.   metFORMIN 500 MG tablet Commonly known as: GLUCOPHAGE TAKE 1 TABLET BY MOUTH ONCE DAILY WITH BREAKFAST What changed: See the new instructions.   methocarbamol 500 MG tablet Commonly known as: Robaxin Take 1 tablet (500 mg total) by mouth 4 (four) times daily.   nystatin cream Commonly known as: MYCOSTATIN Apply 1 application topically daily as needed (yeast).   ondansetron 4 MG tablet Commonly known as: Zofran Take 1 tablet (4 mg total) by mouth daily as needed for nausea or vomiting.   OneTouch Delica Lancets 09W Misc USE 1  TO CHECK GLUCOSE TWICE DAILY What changed: See the new instructions.   OneTouch Verio test strip Generic drug: glucose blood USE 1 STRIP TO CHECK GLUCOSE TWICE DAILY AS DIRECTED What changed: See the new instructions.   OneTouch Verio w/Device Kit by Does not apply route. Check blood sugar twice daily  as directed DX E11.65   OVER THE COUNTER MEDICATION Apply 1 application topically every other day. Foot miracle cream - apply every other night   oxyCODONE 5 MG immediate release tablet Commonly known as: Roxicodone Take 1 tablet (5 mg total) by mouth every 4 (four) hours as needed for up to 6 doses for severe pain.   senna-docusate 8.6-50 MG tablet Commonly known as: Senokot-S Take 1 tablet by mouth at bedtime.   simvastatin 20 MG tablet Commonly known as: ZOCOR TAKE 1 TABLET BY MOUTH ONCE DAILY FOR CHOLESTEROL What changed: See the new instructions.   tolnaftate 1 % cream Commonly known as: TINACTIN Apply 1 application topically every other day. At night   traMADol 50 MG tablet Commonly known as: ULTRAM Take one to two tablets by mouth every 6 hours as needed for pain. Do not take more than 6 tablets in 24 hours What changed:   how much to take  how to take this  when to take this  reasons to take this  additional instructions   Vitamin D3 50 MCG (2000 UT) Tabs Take 2,000 Units by mouth daily.   warfarin 5 MG tablet Commonly known as: COUMADIN Take as directed. If you are unsure how to take this medication, talk to your nurse or doctor. Original instructions: Take 5-7.5 mg by mouth See admin instructions. Take 7.5 mg with the 10 mg to equal 17.5 mg after supper on Sun/Mon/Wed/Fri/Sat and 5 mg with the 10 mg to equal 15 mg after supper on Tues/Thurs What changed: Another medication with the same name was changed. Make sure you understand how and when to take each.   warfarin 10 MG tablet Commonly known as: COUMADIN Take as directed. If you are unsure how to take this medication, talk to your nurse or doctor. Original instructions: TAKE 1 TABLET BY MOUTH ONCE DAILY ALONG  WITH  A  5  MG  TABLET  FOR  A  TOTAL  OF  15  MG  DAILY What changed:   how much to take  how to take this  when to take this  additional instructions       Contact information for  follow-up providers    Marton Redwood, MD Follow up in 1 week(s).   Specialty: Internal Medicine Contact information: [place] [address] [phone]            Contact information for after-discharge care    Destination    [facility]erred [facility] .   Service:  Skilled Nursing Contact information: [place] [phone]                 No Known Allergies  The results of significant diagnostics from this hospitalization (including imaging, microbiology, ancillary and laboratory) are listed below for reference.    Microbiology: Recent Results (from the past 240 hour(s))  SARS CORONAVIRUS 2 (TAT 6-24 HRS) Nasopharyngeal Nasopharyngeal Swab     Status: None   Collection Time: 02/07/20  2:55 PM   Specimen: Nasopharyngeal Swab  Result Value Ref Range Status   SARS Coronavirus 2 NEGATIVE NEGATIVE Final    Comment: (NOTE) SARS-CoV-2 target nucleic acids are NOT DETECTED. The SARS-CoV-2 RNA is generally detectable in upper and lower respiratory specimens during the acute phase of infection. Negative results do not preclude SARS-CoV-2 infection, do not rule out co-infections with other pathogens, and should not be used as the sole basis for treatment or other patient management decisions. Negative results must be combined with clinical observations, patient history, and epidemiological information. The expected result is Negative. Fact Sheet for Patients: SugarRoll.be Fact Sheet for Healthcare Providers: https://www.woods-mathews.com/ This test is not yet approved or cleared by the [place] FDA and  has been authorized for detection and/or diagnosis of SARS-CoV-2 by FDA under an Emergency Use Authorization (EUA). This EUA will remain  in effect (meaning this test can be used) for the duration of the COVID-19 declaration under Section 56 4(b)(1) of the Act, 21  U.S.C. section 360bbb-3(b)(1), unless the authorization is terminated or revoked sooner. Performed at [facility], [place] [address] [place], [place] [address]   SARS Coronavirus 2 by RT PCR (hospital order, performed in [facility] hospital lab) Nasopharyngeal Nasopharyngeal Swab     Status: None   Collection Time: 02/14/20 12:05 AM   Specimen: Nasopharyngeal Swab  Result Value Ref Range Status   SARS Coronavirus 2 NEGATIVE NEGATIVE Final    Comment: (NOTE) SARS-CoV-2 target nucleic acids are NOT DETECTED. The SARS-CoV-2 RNA is generally detectable in upper and lower respiratory specimens during the acute phase of infection. The lowest concentration of SARS-CoV-2 viral copies this assay can detect is 250 copies / mL. A negative result does not preclude SARS-CoV-2 infection and should not be used as the sole basis for treatment or other patient management decisions.  A negative result may occur with improper specimen collection / handling, submission of specimen other than nasopharyngeal swab, presence of viral mutation(s) within the areas targeted by this assay, and inadequate number of viral copies (<250 copies / mL). A negative result must be combined with clinical observations, patient history, and epidemiological information. Fact Sheet for Patients:   StrictlyIdeas.no Fact Sheet for Healthcare Providers: BankingDealers.co.za This test is not yet approved or cleared  by the [place] FDA and has been authorized for detection and/or diagnosis of SARS-CoV-2 by FDA under an Emergency Use Authorization (EUA).  This EUA will remain in effect (meaning this test can be used) for the duration of the COVID-19 declaration under Section 564(b)(1) of the Act, 21 U.S.C. section 360bbb-3(b)(1), unless the authorization is terminated or revoked sooner. Performed at [facility], [place] [address], [place], [place]  [address]     Procedures/Studies: DG Chest 2 View  Result Date: 02/13/2020 CLINICAL DATA:  Syncope. EXAM: CHEST - 2 VIEW COMPARISON:  December 02, 2016. FINDINGS: The heart size and mediastinal contours are within normal limits. Both lungs are clear. No pneumothorax or pleural effusion is  noted. The visualized skeletal structures are unremarkable. IMPRESSION: No active cardiopulmonary disease. Electronically Signed   By: Marijo Conception M.D.   On: 02/13/2020 20:00   CT Head Wo Contrast  Result Date: 02/13/2020 CLINICAL DATA:  Neuro deficit, acute, stroke suspected lightheadedness, syncope, hx stroke, concern for head bleed or recurrent stroke Discharged earlier today after knee replacement. Patient reports a near syncopal episode getting out of car. EXAM: CT HEAD WITHOUT CONTRAST TECHNIQUE: Contiguous axial images were obtained from the base of the skull through the vertex without intravenous contrast. COMPARISON:  Head CT 06/21/2019, brain MRI 06/22/2019 FINDINGS: Brain: No evidence of acute infarction, hemorrhage, hydrocephalus, extra-axial collection or mass lesion/mass effect. Stable degree of generalized atrophy and chronic small vessel ischemia. Unchanged remote left parietal infarct. Vascular: Atherosclerosis of skullbase vasculature without hyperdense vessel or abnormal calcification. Skull: No fracture or focal lesion. Sinuses/Orbits: Minor mucosal thickening of ethmoid air cells. No sinus fluid levels. Orbits are unremarkable. Other: None. IMPRESSION: 1. No acute intracranial abnormality. 2. Stable atrophy, chronic small vessel ischemia, and remote left parietal infarct. Electronically Signed   By: Keith Rake M.D.   On: 02/13/2020 22:07   CT Angio Chest PE W and/or Wo Contrast  Result Date: 02/13/2020 CLINICAL DATA:  Syncopal episode. EXAM: CT ANGIOGRAPHY CHEST WITH CONTRAST TECHNIQUE: Multidetector CT imaging of the chest was performed using the standard protocol during bolus administration  of intravenous contrast. Multiplanar CT image reconstructions and MIPs were obtained to evaluate the vascular anatomy. CONTRAST:  12m OMNIPAQUE IOHEXOL 350 MG/ML SOLN COMPARISON:  None. FINDINGS: Cardiovascular: There is moderate to marked severity calcification of the aortic arch. Satisfactory opacification of the pulmonary arteries to the segmental level. No evidence of pulmonary embolism. Normal heart size. No pericardial effusion. Moderate severity coronary artery calcification is seen. Mediastinum/Nodes: No enlarged mediastinal, hilar, or axillary lymph nodes. Thyroid gland, trachea, and esophagus demonstrate no significant findings. Lungs/Pleura: Lungs are clear. No pleural effusion or pneumothorax. Upper Abdomen: There is a small hiatal hernia. Musculoskeletal: Multilevel degenerative changes seen throughout the thoracic spine. Review of the MIP images confirms the above findings. IMPRESSION: 1. No evidence of pulmonary embolus. 2. Moderate to marked severity calcification of the aortic arch. 3. Moderate severity coronary artery calcification. 4. Small hiatal hernia. 5. Multilevel degenerative changes throughout the thoracic spine. Aortic Atherosclerosis (ICD10-I70.0). Electronically Signed   By: TVirgina NorfolkM.D.   On: 02/13/2020 22:13    Labs: BNP (last 3 results) Recent Labs    02/13/20 1927  BNP 654.0  Basic Metabolic Panel: Recent Labs  Lab 02/13/20 1927 02/14/20 0815 02/15/20 0256 02/16/20 0231 02/17/20 0416  NA 129* 133* 133* 132* 136  K 4.6 4.3 4.1 4.1 4.4  CL 97* 99 101 99 101  CO2 25 25 24 23 27   GLUCOSE 177* 103* 108* 107* 101*  BUN 25* 19 20 18 19   CREATININE 0.98 0.84 0.78 0.69 0.78  CALCIUM 8.3* 8.3* 8.0* 8.2* 8.3*   Liver Function Tests: Recent Labs  Lab 02/13/20 1927  AST 28  ALT 27  ALKPHOS 66  BILITOT 0.7  PROT 6.0*  ALBUMIN 2.9*   No results for input(s): LIPASE, AMYLASE in the last 168 hours. No results for input(s): AMMONIA in the last 168  hours. CBC: Recent Labs  Lab 02/13/20 1927 02/13/20 1927 02/14/20 0815 02/14/20 1207 02/15/20 0256 02/15/20 0256 02/15/20 1118 02/15/20 1835 02/16/20 0231 02/16/20 1059 02/17/20 0416  WBC 8.9  --  7.5  --  7.1  --   --   --  7.0  --  7.7  NEUTROABS 7.3  --   --   --   --   --   --   --   --   --   --   HGB 7.4*   < > 7.7*   < > 7.9*   < > 8.2* 8.2* 7.9* 8.0* 8.1*  HCT 23.0*   < > 24.1*   < > 24.6*   < > 26.1* 26.1* 25.5* 25.8* 25.7*  MCV 87.1  --  87.6  --  88.2  --   --   --  89.2  --  88.3  PLT 196  --  202  --  211  --   --   --  219  --  235   < > = values in this interval not displayed.   Cardiac Enzymes: No results for input(s): CKTOTAL, CKMB, CKMBINDEX, TROPONINI in the last 168 hours. BNP: Invalid input(s): POCBNP CBG: Recent Labs  Lab 02/16/20 1724 02/16/20 2045 02/16/20 2232 02/17/20 0726 02/17/20 1120  GLUCAP 102* 122* 105* 83 160*   D-Dimer No results for input(s): DDIMER in the last 72 hours. Hgb A1c No results for input(s): HGBA1C in the last 72 hours. Lipid Profile No results for input(s): CHOL, HDL, LDLCALC, TRIG, CHOLHDL, LDLDIRECT in the last 72 hours. Thyroid function studies No results for input(s): TSH, T4TOTAL, T3FREE, THYROIDAB in the last 72 hours.  Invalid input(s): FREET3 Anemia work up No results for input(s): VITAMINB12, FOLATE, FERRITIN, TIBC, IRON, RETICCTPCT in the last 72 hours. Urinalysis    Component Value Date/Time   COLORURINE YELLOW 02/13/2020 2200   APPEARANCEUR CLEAR 02/13/2020 2200   LABSPEC 1.026 02/13/2020 2200   PHURINE 5.0 02/13/2020 2200   GLUCOSEU NEGATIVE 02/13/2020 2200   HGBUR NEGATIVE 02/13/2020 2200   BILIRUBINUR NEGATIVE 02/13/2020 2200   KETONESUR NEGATIVE 02/13/2020 2200   PROTEINUR NEGATIVE 02/13/2020 2200   UROBILINOGEN 0.2 06/07/2009 0815   NITRITE NEGATIVE 02/13/2020 2200   LEUKOCYTESUR NEGATIVE 02/13/2020 2200   Sepsis Labs Invalid input(s): PROCALCITONIN,  WBC,   LACTICIDVEN Microbiology Recent Results (from the past 240 hour(s))  SARS CORONAVIRUS 2 (TAT 6-24 HRS) Nasopharyngeal Nasopharyngeal Swab     Status: None   Collection Time: 02/07/20  2:55 PM   Specimen: Nasopharyngeal Swab  Result Value Ref Range Status   SARS Coronavirus 2 NEGATIVE NEGATIVE Final    Comment: (NOTE) SARS-CoV-2 target nucleic acids are NOT DETECTED. The SARS-CoV-2 RNA is generally detectable in upper and lower respiratory specimens during the acute phase of infection. Negative results do not preclude SARS-CoV-2 infection, do not rule out co-infections with other pathogens, and should not be used as the sole basis for treatment or other patient management decisions. Negative results must be combined with clinical observations, patient history, and epidemiological information. The expected result is Negative. Fact Sheet for Patients: SugarRoll.be Fact Sheet for Healthcare Providers: https://www.woods-mathews.com/ This test is not yet approved or cleared by the [place] FDA and  has been authorized for detection and/or diagnosis of SARS-CoV-2 by FDA under an Emergency Use Authorization (EUA). This EUA will remain  in effect (meaning this test can be used) for the duration of the COVID-19 declaration under Section 56 4(b)(1) of the Act, 21 U.S.C. section 360bbb-3(b)(1), unless the authorization is terminated or revoked sooner. Performed at [facility], [place] [address] [place], [place] [address]   SARS Coronavirus 2 by RT PCR (hospital order, performed in [facility] hospital lab) Nasopharyngeal Nasopharyngeal  Swab     Status: None   Collection Time: 02/14/20 12:05 AM   Specimen: Nasopharyngeal Swab  Result Value Ref Range Status   SARS Coronavirus 2 NEGATIVE NEGATIVE Final    Comment: (NOTE) SARS-CoV-2 target nucleic acids are NOT DETECTED. The SARS-CoV-2 RNA is generally detectable in upper and lower respiratory  specimens during the acute phase of infection. The lowest concentration of SARS-CoV-2 viral copies this assay can detect is 250 copies / mL. A negative result does not preclude SARS-CoV-2 infection and should not be used as the sole basis for treatment or other patient management decisions.  A negative result may occur with improper specimen collection / handling, submission of specimen other than nasopharyngeal swab, presence of viral mutation(s) within the areas targeted by this assay, and inadequate number of viral copies (<250 copies / mL). A negative result must be combined with clinical observations, patient history, and epidemiological information. Fact Sheet for Patients:   StrictlyIdeas.no Fact Sheet for Healthcare Providers: BankingDealers.co.za This test is not yet approved or cleared  by the [place] FDA and has been authorized for detection and/or diagnosis of SARS-CoV-2 by FDA under an Emergency Use Authorization (EUA).  This EUA will remain in effect (meaning this test can be used) for the duration of the COVID-19 declaration under Section 564(b)(1) of the Act, 21 U.S.C. section 360bbb-3(b)(1), unless the authorization is terminated or revoked sooner. Performed at [facility], [place] [address] [place], [place] [address]      Time coordinating discharge: 25 minutes  SIGNED: Antonieta Pert, MD  [facility] 02/17/2020, 11:37 AM  If 7PM-7AM, please contact night-coverage [email or web address]

## 2020-02-21 DIAGNOSIS — R0989 Other specified symptoms and signs involving the circulatory and respiratory systems: Secondary | ICD-10-CM | POA: Diagnosis not present

## 2020-02-21 DIAGNOSIS — E0841 Diabetes mellitus due to underlying condition with diabetic mononeuropathy: Secondary | ICD-10-CM | POA: Diagnosis not present

## 2020-02-21 DIAGNOSIS — Z96652 Presence of left artificial knee joint: Secondary | ICD-10-CM | POA: Diagnosis not present

## 2020-02-21 DIAGNOSIS — D6489 Other specified anemias: Secondary | ICD-10-CM | POA: Diagnosis not present

## 2020-02-21 DIAGNOSIS — R5381 Other malaise: Secondary | ICD-10-CM | POA: Diagnosis not present

## 2020-02-21 DIAGNOSIS — I480 Paroxysmal atrial fibrillation: Secondary | ICD-10-CM | POA: Diagnosis not present

## 2020-02-21 DIAGNOSIS — D6859 Other primary thrombophilia: Secondary | ICD-10-CM | POA: Diagnosis not present

## 2020-02-21 DIAGNOSIS — E871 Hypo-osmolality and hyponatremia: Secondary | ICD-10-CM | POA: Diagnosis not present

## 2020-02-21 DIAGNOSIS — I700 Atherosclerosis of aorta: Secondary | ICD-10-CM | POA: Diagnosis not present

## 2020-02-21 DIAGNOSIS — Z8546 Personal history of malignant neoplasm of prostate: Secondary | ICD-10-CM | POA: Diagnosis not present

## 2020-02-21 DIAGNOSIS — D689 Coagulation defect, unspecified: Secondary | ICD-10-CM | POA: Diagnosis not present

## 2020-02-24 DIAGNOSIS — Z5181 Encounter for therapeutic drug level monitoring: Secondary | ICD-10-CM | POA: Diagnosis not present

## 2020-02-24 DIAGNOSIS — Z7901 Long term (current) use of anticoagulants: Secondary | ICD-10-CM | POA: Diagnosis not present

## 2020-02-24 DIAGNOSIS — D6859 Other primary thrombophilia: Secondary | ICD-10-CM | POA: Diagnosis not present

## 2020-02-24 DIAGNOSIS — I480 Paroxysmal atrial fibrillation: Secondary | ICD-10-CM | POA: Diagnosis not present

## 2020-02-27 DIAGNOSIS — L0591 Pilonidal cyst without abscess: Secondary | ICD-10-CM | POA: Diagnosis not present

## 2020-02-28 DIAGNOSIS — D6859 Other primary thrombophilia: Secondary | ICD-10-CM | POA: Diagnosis not present

## 2020-02-28 DIAGNOSIS — Z5181 Encounter for therapeutic drug level monitoring: Secondary | ICD-10-CM | POA: Diagnosis not present

## 2020-02-28 DIAGNOSIS — Z96652 Presence of left artificial knee joint: Secondary | ICD-10-CM | POA: Diagnosis not present

## 2020-02-28 DIAGNOSIS — Z713 Dietary counseling and surveillance: Secondary | ICD-10-CM | POA: Diagnosis not present

## 2020-02-28 DIAGNOSIS — R0989 Other specified symptoms and signs involving the circulatory and respiratory systems: Secondary | ICD-10-CM | POA: Diagnosis not present

## 2020-02-28 DIAGNOSIS — Z7901 Long term (current) use of anticoagulants: Secondary | ICD-10-CM | POA: Diagnosis not present

## 2020-03-05 DIAGNOSIS — E0841 Diabetes mellitus due to underlying condition with diabetic mononeuropathy: Secondary | ICD-10-CM | POA: Diagnosis not present

## 2020-03-05 DIAGNOSIS — Z8546 Personal history of malignant neoplasm of prostate: Secondary | ICD-10-CM | POA: Diagnosis not present

## 2020-03-05 DIAGNOSIS — E7849 Other hyperlipidemia: Secondary | ICD-10-CM | POA: Diagnosis not present

## 2020-03-05 DIAGNOSIS — Z4789 Encounter for other orthopedic aftercare: Secondary | ICD-10-CM | POA: Diagnosis not present

## 2020-03-05 DIAGNOSIS — D6489 Other specified anemias: Secondary | ICD-10-CM | POA: Diagnosis not present

## 2020-03-05 DIAGNOSIS — R0989 Other specified symptoms and signs involving the circulatory and respiratory systems: Secondary | ICD-10-CM | POA: Diagnosis not present

## 2020-03-05 DIAGNOSIS — Z96652 Presence of left artificial knee joint: Secondary | ICD-10-CM | POA: Diagnosis not present

## 2020-03-05 DIAGNOSIS — I480 Paroxysmal atrial fibrillation: Secondary | ICD-10-CM | POA: Diagnosis not present

## 2020-03-05 DIAGNOSIS — D6859 Other primary thrombophilia: Secondary | ICD-10-CM | POA: Diagnosis not present

## 2020-03-05 DIAGNOSIS — E871 Hypo-osmolality and hyponatremia: Secondary | ICD-10-CM | POA: Diagnosis not present

## 2020-03-08 DIAGNOSIS — M5030 Other cervical disc degeneration, unspecified cervical region: Secondary | ICD-10-CM | POA: Diagnosis not present

## 2020-03-08 DIAGNOSIS — I480 Paroxysmal atrial fibrillation: Secondary | ICD-10-CM | POA: Diagnosis not present

## 2020-03-08 DIAGNOSIS — I10 Essential (primary) hypertension: Secondary | ICD-10-CM | POA: Diagnosis not present

## 2020-03-08 DIAGNOSIS — Z9181 History of falling: Secondary | ICD-10-CM | POA: Diagnosis not present

## 2020-03-08 DIAGNOSIS — Z86711 Personal history of pulmonary embolism: Secondary | ICD-10-CM | POA: Diagnosis not present

## 2020-03-08 DIAGNOSIS — K7460 Unspecified cirrhosis of liver: Secondary | ICD-10-CM | POA: Diagnosis not present

## 2020-03-08 DIAGNOSIS — D509 Iron deficiency anemia, unspecified: Secondary | ICD-10-CM | POA: Diagnosis not present

## 2020-03-08 DIAGNOSIS — Z6841 Body Mass Index (BMI) 40.0 and over, adult: Secondary | ICD-10-CM | POA: Diagnosis not present

## 2020-03-08 DIAGNOSIS — R351 Nocturia: Secondary | ICD-10-CM | POA: Diagnosis not present

## 2020-03-08 DIAGNOSIS — Z86718 Personal history of other venous thrombosis and embolism: Secondary | ICD-10-CM | POA: Diagnosis not present

## 2020-03-08 DIAGNOSIS — E785 Hyperlipidemia, unspecified: Secondary | ICD-10-CM | POA: Diagnosis not present

## 2020-03-08 DIAGNOSIS — D6859 Other primary thrombophilia: Secondary | ICD-10-CM | POA: Diagnosis not present

## 2020-03-08 DIAGNOSIS — Z96643 Presence of artificial hip joint, bilateral: Secondary | ICD-10-CM | POA: Diagnosis not present

## 2020-03-08 DIAGNOSIS — E1142 Type 2 diabetes mellitus with diabetic polyneuropathy: Secondary | ICD-10-CM | POA: Diagnosis not present

## 2020-03-08 DIAGNOSIS — Z7984 Long term (current) use of oral hypoglycemic drugs: Secondary | ICD-10-CM | POA: Diagnosis not present

## 2020-03-08 DIAGNOSIS — Z8673 Personal history of transient ischemic attack (TIA), and cerebral infarction without residual deficits: Secondary | ICD-10-CM | POA: Diagnosis not present

## 2020-03-08 DIAGNOSIS — Z7901 Long term (current) use of anticoagulants: Secondary | ICD-10-CM | POA: Diagnosis not present

## 2020-03-08 DIAGNOSIS — Z96652 Presence of left artificial knee joint: Secondary | ICD-10-CM | POA: Diagnosis not present

## 2020-03-08 DIAGNOSIS — F329 Major depressive disorder, single episode, unspecified: Secondary | ICD-10-CM | POA: Diagnosis not present

## 2020-03-08 DIAGNOSIS — J449 Chronic obstructive pulmonary disease, unspecified: Secondary | ICD-10-CM | POA: Diagnosis not present

## 2020-03-08 DIAGNOSIS — K5909 Other constipation: Secondary | ICD-10-CM | POA: Diagnosis not present

## 2020-03-08 DIAGNOSIS — N401 Enlarged prostate with lower urinary tract symptoms: Secondary | ICD-10-CM | POA: Diagnosis not present

## 2020-03-08 DIAGNOSIS — Z471 Aftercare following joint replacement surgery: Secondary | ICD-10-CM | POA: Diagnosis not present

## 2020-03-08 DIAGNOSIS — I4510 Unspecified right bundle-branch block: Secondary | ICD-10-CM | POA: Diagnosis not present

## 2020-03-16 DIAGNOSIS — T8189XA Other complications of procedures, not elsewhere classified, initial encounter: Secondary | ICD-10-CM | POA: Diagnosis not present

## 2020-03-19 DIAGNOSIS — L84 Corns and callosities: Secondary | ICD-10-CM | POA: Diagnosis not present

## 2020-03-19 DIAGNOSIS — B351 Tinea unguium: Secondary | ICD-10-CM | POA: Diagnosis not present

## 2020-03-19 DIAGNOSIS — E1151 Type 2 diabetes mellitus with diabetic peripheral angiopathy without gangrene: Secondary | ICD-10-CM | POA: Diagnosis not present

## 2020-03-21 DIAGNOSIS — E871 Hypo-osmolality and hyponatremia: Secondary | ICD-10-CM | POA: Diagnosis not present

## 2020-03-21 DIAGNOSIS — E669 Obesity, unspecified: Secondary | ICD-10-CM | POA: Diagnosis not present

## 2020-03-21 DIAGNOSIS — I480 Paroxysmal atrial fibrillation: Secondary | ICD-10-CM | POA: Diagnosis not present

## 2020-03-21 DIAGNOSIS — I10 Essential (primary) hypertension: Secondary | ICD-10-CM | POA: Diagnosis not present

## 2020-03-21 DIAGNOSIS — R5381 Other malaise: Secondary | ICD-10-CM | POA: Diagnosis not present

## 2020-03-21 DIAGNOSIS — E785 Hyperlipidemia, unspecified: Secondary | ICD-10-CM | POA: Diagnosis not present

## 2020-03-21 DIAGNOSIS — D6869 Other thrombophilia: Secondary | ICD-10-CM | POA: Diagnosis not present

## 2020-03-21 DIAGNOSIS — Z96652 Presence of left artificial knee joint: Secondary | ICD-10-CM | POA: Diagnosis not present

## 2020-03-21 DIAGNOSIS — L89153 Pressure ulcer of sacral region, stage 3: Secondary | ICD-10-CM | POA: Diagnosis not present

## 2020-03-21 DIAGNOSIS — D62 Acute posthemorrhagic anemia: Secondary | ICD-10-CM | POA: Diagnosis not present

## 2020-03-27 DIAGNOSIS — M25562 Pain in left knee: Secondary | ICD-10-CM | POA: Diagnosis not present

## 2020-03-30 DIAGNOSIS — L89153 Pressure ulcer of sacral region, stage 3: Secondary | ICD-10-CM | POA: Diagnosis not present

## 2020-04-04 DIAGNOSIS — M25562 Pain in left knee: Secondary | ICD-10-CM | POA: Diagnosis not present

## 2020-04-05 DIAGNOSIS — L89153 Pressure ulcer of sacral region, stage 3: Secondary | ICD-10-CM | POA: Diagnosis not present

## 2020-04-05 DIAGNOSIS — I10 Essential (primary) hypertension: Secondary | ICD-10-CM | POA: Diagnosis not present

## 2020-04-06 DIAGNOSIS — M25562 Pain in left knee: Secondary | ICD-10-CM | POA: Diagnosis not present

## 2020-04-10 DIAGNOSIS — M25562 Pain in left knee: Secondary | ICD-10-CM | POA: Diagnosis not present

## 2020-04-12 DIAGNOSIS — M25562 Pain in left knee: Secondary | ICD-10-CM | POA: Diagnosis not present

## 2020-04-17 DIAGNOSIS — M25562 Pain in left knee: Secondary | ICD-10-CM | POA: Diagnosis not present

## 2020-04-19 DIAGNOSIS — E871 Hypo-osmolality and hyponatremia: Secondary | ICD-10-CM | POA: Diagnosis not present

## 2020-04-19 DIAGNOSIS — M25562 Pain in left knee: Secondary | ICD-10-CM | POA: Diagnosis not present

## 2020-04-19 DIAGNOSIS — D6869 Other thrombophilia: Secondary | ICD-10-CM | POA: Diagnosis not present

## 2020-04-19 DIAGNOSIS — D62 Acute posthemorrhagic anemia: Secondary | ICD-10-CM | POA: Diagnosis not present

## 2020-04-19 DIAGNOSIS — E1169 Type 2 diabetes mellitus with other specified complication: Secondary | ICD-10-CM | POA: Diagnosis not present

## 2020-04-19 DIAGNOSIS — L89153 Pressure ulcer of sacral region, stage 3: Secondary | ICD-10-CM | POA: Diagnosis not present

## 2020-04-19 DIAGNOSIS — I480 Paroxysmal atrial fibrillation: Secondary | ICD-10-CM | POA: Diagnosis not present

## 2020-04-19 DIAGNOSIS — Z7901 Long term (current) use of anticoagulants: Secondary | ICD-10-CM | POA: Diagnosis not present

## 2020-04-24 DIAGNOSIS — M25562 Pain in left knee: Secondary | ICD-10-CM | POA: Diagnosis not present

## 2020-04-26 DIAGNOSIS — M25562 Pain in left knee: Secondary | ICD-10-CM | POA: Diagnosis not present

## 2020-05-22 DIAGNOSIS — E1169 Type 2 diabetes mellitus with other specified complication: Secondary | ICD-10-CM | POA: Diagnosis not present

## 2020-05-22 DIAGNOSIS — I10 Essential (primary) hypertension: Secondary | ICD-10-CM | POA: Diagnosis not present

## 2020-05-22 DIAGNOSIS — Z7901 Long term (current) use of anticoagulants: Secondary | ICD-10-CM | POA: Diagnosis not present

## 2020-05-22 DIAGNOSIS — E785 Hyperlipidemia, unspecified: Secondary | ICD-10-CM | POA: Diagnosis not present

## 2020-05-22 DIAGNOSIS — D6869 Other thrombophilia: Secondary | ICD-10-CM | POA: Diagnosis not present

## 2020-05-22 DIAGNOSIS — B379 Candidiasis, unspecified: Secondary | ICD-10-CM | POA: Diagnosis not present

## 2020-05-22 DIAGNOSIS — Z86718 Personal history of other venous thrombosis and embolism: Secondary | ICD-10-CM | POA: Diagnosis not present

## 2020-05-22 DIAGNOSIS — E669 Obesity, unspecified: Secondary | ICD-10-CM | POA: Diagnosis not present

## 2020-05-22 DIAGNOSIS — I480 Paroxysmal atrial fibrillation: Secondary | ICD-10-CM | POA: Diagnosis not present

## 2020-06-07 DIAGNOSIS — R351 Nocturia: Secondary | ICD-10-CM | POA: Diagnosis not present

## 2020-06-07 DIAGNOSIS — R3915 Urgency of urination: Secondary | ICD-10-CM | POA: Diagnosis not present

## 2020-06-07 DIAGNOSIS — C61 Malignant neoplasm of prostate: Secondary | ICD-10-CM | POA: Diagnosis not present

## 2020-06-11 DIAGNOSIS — L821 Other seborrheic keratosis: Secondary | ICD-10-CM | POA: Diagnosis not present

## 2020-06-11 DIAGNOSIS — Z85828 Personal history of other malignant neoplasm of skin: Secondary | ICD-10-CM | POA: Diagnosis not present

## 2020-06-12 DIAGNOSIS — M25562 Pain in left knee: Secondary | ICD-10-CM | POA: Diagnosis not present

## 2020-06-15 DIAGNOSIS — M25562 Pain in left knee: Secondary | ICD-10-CM | POA: Diagnosis not present

## 2020-06-18 DIAGNOSIS — L84 Corns and callosities: Secondary | ICD-10-CM | POA: Diagnosis not present

## 2020-06-18 DIAGNOSIS — E1151 Type 2 diabetes mellitus with diabetic peripheral angiopathy without gangrene: Secondary | ICD-10-CM | POA: Diagnosis not present

## 2020-06-18 DIAGNOSIS — B351 Tinea unguium: Secondary | ICD-10-CM | POA: Diagnosis not present

## 2020-06-19 DIAGNOSIS — M25562 Pain in left knee: Secondary | ICD-10-CM | POA: Diagnosis not present

## 2020-06-20 DIAGNOSIS — Z96652 Presence of left artificial knee joint: Secondary | ICD-10-CM | POA: Diagnosis not present

## 2020-06-20 DIAGNOSIS — Z471 Aftercare following joint replacement surgery: Secondary | ICD-10-CM | POA: Diagnosis not present

## 2020-06-21 DIAGNOSIS — D6869 Other thrombophilia: Secondary | ICD-10-CM | POA: Diagnosis not present

## 2020-06-21 DIAGNOSIS — E1169 Type 2 diabetes mellitus with other specified complication: Secondary | ICD-10-CM | POA: Diagnosis not present

## 2020-06-21 DIAGNOSIS — I480 Paroxysmal atrial fibrillation: Secondary | ICD-10-CM | POA: Diagnosis not present

## 2020-06-21 DIAGNOSIS — Z7901 Long term (current) use of anticoagulants: Secondary | ICD-10-CM | POA: Diagnosis not present

## 2020-06-21 DIAGNOSIS — Z86718 Personal history of other venous thrombosis and embolism: Secondary | ICD-10-CM | POA: Diagnosis not present

## 2020-06-22 DIAGNOSIS — M25562 Pain in left knee: Secondary | ICD-10-CM | POA: Diagnosis not present

## 2020-06-27 DIAGNOSIS — M25562 Pain in left knee: Secondary | ICD-10-CM | POA: Diagnosis not present

## 2020-07-02 DIAGNOSIS — M25562 Pain in left knee: Secondary | ICD-10-CM | POA: Diagnosis not present

## 2020-07-05 DIAGNOSIS — M25562 Pain in left knee: Secondary | ICD-10-CM | POA: Diagnosis not present

## 2020-07-11 DIAGNOSIS — M25562 Pain in left knee: Secondary | ICD-10-CM | POA: Diagnosis not present

## 2020-07-13 DIAGNOSIS — M25562 Pain in left knee: Secondary | ICD-10-CM | POA: Diagnosis not present

## 2020-07-16 DIAGNOSIS — M25562 Pain in left knee: Secondary | ICD-10-CM | POA: Diagnosis not present

## 2020-07-18 DIAGNOSIS — M25562 Pain in left knee: Secondary | ICD-10-CM | POA: Diagnosis not present

## 2020-07-24 DIAGNOSIS — I480 Paroxysmal atrial fibrillation: Secondary | ICD-10-CM | POA: Diagnosis not present

## 2020-07-24 DIAGNOSIS — D6869 Other thrombophilia: Secondary | ICD-10-CM | POA: Diagnosis not present

## 2020-07-24 DIAGNOSIS — Z7901 Long term (current) use of anticoagulants: Secondary | ICD-10-CM | POA: Diagnosis not present

## 2020-07-24 DIAGNOSIS — E1169 Type 2 diabetes mellitus with other specified complication: Secondary | ICD-10-CM | POA: Diagnosis not present

## 2020-07-24 DIAGNOSIS — Z86718 Personal history of other venous thrombosis and embolism: Secondary | ICD-10-CM | POA: Diagnosis not present

## 2020-07-25 DIAGNOSIS — M25562 Pain in left knee: Secondary | ICD-10-CM | POA: Diagnosis not present

## 2020-07-27 DIAGNOSIS — M25562 Pain in left knee: Secondary | ICD-10-CM | POA: Diagnosis not present

## 2020-07-30 DIAGNOSIS — M25562 Pain in left knee: Secondary | ICD-10-CM | POA: Diagnosis not present

## 2020-08-01 DIAGNOSIS — Z96652 Presence of left artificial knee joint: Secondary | ICD-10-CM | POA: Diagnosis not present

## 2020-08-02 DIAGNOSIS — M25562 Pain in left knee: Secondary | ICD-10-CM | POA: Diagnosis not present

## 2020-08-06 DIAGNOSIS — M25562 Pain in left knee: Secondary | ICD-10-CM | POA: Diagnosis not present

## 2020-08-08 DIAGNOSIS — M25562 Pain in left knee: Secondary | ICD-10-CM | POA: Diagnosis not present

## 2020-08-13 DIAGNOSIS — M25562 Pain in left knee: Secondary | ICD-10-CM | POA: Diagnosis not present

## 2020-08-20 DIAGNOSIS — M25562 Pain in left knee: Secondary | ICD-10-CM | POA: Diagnosis not present

## 2020-08-23 DIAGNOSIS — Z7901 Long term (current) use of anticoagulants: Secondary | ICD-10-CM | POA: Diagnosis not present

## 2020-08-23 DIAGNOSIS — I480 Paroxysmal atrial fibrillation: Secondary | ICD-10-CM | POA: Diagnosis not present

## 2020-08-23 DIAGNOSIS — D6869 Other thrombophilia: Secondary | ICD-10-CM | POA: Diagnosis not present

## 2020-08-23 DIAGNOSIS — L89150 Pressure ulcer of sacral region, unstageable: Secondary | ICD-10-CM | POA: Diagnosis not present

## 2020-08-23 DIAGNOSIS — Z86718 Personal history of other venous thrombosis and embolism: Secondary | ICD-10-CM | POA: Diagnosis not present

## 2020-08-23 DIAGNOSIS — E1169 Type 2 diabetes mellitus with other specified complication: Secondary | ICD-10-CM | POA: Diagnosis not present

## 2020-08-24 DIAGNOSIS — L89152 Pressure ulcer of sacral region, stage 2: Secondary | ICD-10-CM | POA: Diagnosis not present

## 2020-08-28 DIAGNOSIS — M25562 Pain in left knee: Secondary | ICD-10-CM | POA: Diagnosis not present

## 2020-08-30 DIAGNOSIS — M25562 Pain in left knee: Secondary | ICD-10-CM | POA: Diagnosis not present

## 2020-09-04 DIAGNOSIS — M25562 Pain in left knee: Secondary | ICD-10-CM | POA: Diagnosis not present

## 2020-09-06 DIAGNOSIS — M25562 Pain in left knee: Secondary | ICD-10-CM | POA: Diagnosis not present

## 2020-09-10 DIAGNOSIS — M25562 Pain in left knee: Secondary | ICD-10-CM | POA: Diagnosis not present

## 2020-09-12 DIAGNOSIS — M25562 Pain in left knee: Secondary | ICD-10-CM | POA: Diagnosis not present

## 2020-09-17 DIAGNOSIS — M25562 Pain in left knee: Secondary | ICD-10-CM | POA: Diagnosis not present

## 2020-09-18 DIAGNOSIS — M24562 Contracture, left knee: Secondary | ICD-10-CM | POA: Diagnosis not present

## 2020-09-19 DIAGNOSIS — M25562 Pain in left knee: Secondary | ICD-10-CM | POA: Diagnosis not present

## 2020-09-20 ENCOUNTER — Ambulatory Visit: Payer: PPO | Admitting: Cardiology

## 2020-09-24 ENCOUNTER — Ambulatory Visit: Payer: PPO | Admitting: Cardiology

## 2020-09-25 DIAGNOSIS — I480 Paroxysmal atrial fibrillation: Secondary | ICD-10-CM | POA: Diagnosis not present

## 2020-09-25 DIAGNOSIS — Z86718 Personal history of other venous thrombosis and embolism: Secondary | ICD-10-CM | POA: Diagnosis not present

## 2020-09-25 DIAGNOSIS — Z7901 Long term (current) use of anticoagulants: Secondary | ICD-10-CM | POA: Diagnosis not present

## 2020-09-25 DIAGNOSIS — E1169 Type 2 diabetes mellitus with other specified complication: Secondary | ICD-10-CM | POA: Diagnosis not present

## 2020-09-25 DIAGNOSIS — D6869 Other thrombophilia: Secondary | ICD-10-CM | POA: Diagnosis not present

## 2020-09-28 ENCOUNTER — Ambulatory Visit: Payer: PPO | Admitting: Cardiology

## 2020-09-28 ENCOUNTER — Other Ambulatory Visit: Payer: Self-pay

## 2020-09-28 ENCOUNTER — Encounter: Payer: Self-pay | Admitting: Cardiology

## 2020-09-28 VITALS — BP 143/61 | HR 63 | Resp 16 | Ht 64.0 in | Wt 244.0 lb

## 2020-09-28 DIAGNOSIS — R0609 Other forms of dyspnea: Secondary | ICD-10-CM | POA: Diagnosis not present

## 2020-09-28 DIAGNOSIS — I10 Essential (primary) hypertension: Secondary | ICD-10-CM | POA: Diagnosis not present

## 2020-09-28 DIAGNOSIS — I480 Paroxysmal atrial fibrillation: Secondary | ICD-10-CM

## 2020-09-28 DIAGNOSIS — I2510 Atherosclerotic heart disease of native coronary artery without angina pectoris: Secondary | ICD-10-CM | POA: Diagnosis not present

## 2020-09-28 DIAGNOSIS — R0600 Dyspnea, unspecified: Secondary | ICD-10-CM

## 2020-09-28 DIAGNOSIS — R0989 Other specified symptoms and signs involving the circulatory and respiratory systems: Secondary | ICD-10-CM

## 2020-09-28 NOTE — Progress Notes (Signed)
Primary Physician/Referring:  Marton Redwood, MD  Patient ID: Colton Miranda, male    DOB: [birth year], 80 y.o.   MRN: [id number]  Chief Complaint  Patient presents with  . Bradycardia  . Atrial Fibrillation  . Hypertension  . Follow-up    1 year   HPI:    Colton Miranda  is a 80 y.o. 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 He was evaluated by Dr. Ignacia Palma in July 2019, there was question about atrial fibrillation. There is no documented atrial fibrillation in chart.   Since his knee replacement on the left in May 2021, his activity has decreased and still recuperating from this.  He has noticed marked dyspnea even doing minimal activity. No PND or orthopnea.   No bleeding diathesis on anticoagulation.  He denies chest pain or palpitations.     Past Medical History:  Diagnosis Date  . BPH associated with nocturia   . Chronic constipation   . COPD (chronic obstructive pulmonary disease) ([place])   . Eczema   . Heart murmur    at birth  . History of adenomatous polyp of colon   . History of basal cell carcinoma (BCC) of skin    post excision from trunk  . 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 20 years ago , 1999, caused by blood clot after knee scope"  DVT to PE  . History of depression    over 40 years ago nervous breakdown  . History of DVT (deep vein thrombosis)    11-06-2017 per pt and wife happened lower leg approx. 1999 after knee scope  . History of pulmonary embolus (PE)    11-06-2017 from DVT in approx. 1999 per wife and pt  . HOH (hard of hearing)   . Hyperlipidemia    . Hypertension    followed by pcp  . Long term (current) use of anticoagulants Coumadin-- followed by Pharmasist w/ [facility]   secondary to primary hypercoagulopathy w/ hx DVT and PE  . Myalgia and myositis, unspecified   . OA (osteoarthritis)    "all joints"  . PAF (paroxysmal atrial fibrillation) ([place]) 2019  . Peripheral neuropathy   . Pinched nerve in neck    per pt causes intermittant numbness bilateral arms and hands  . Primary hypercoagulable state ([place])   . Prostate cancer [facility]) urologist-  dr Alyson Ingles  oncologist-- dr Tammi Klippel   dx 08-28-2017  Stage T1c, Gleason 4+4, PSA 14.1, vol 42.8cc-- planned treatment external radiation and ADT  . 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 Protein S deficiency ([place])   . Right bundle branch block   . Seborrheic dermatitis, unspecified   . Stroke ([place])   . Type 2 diabetes mellitus ([place])   . Urinary frequency   . Wears glasses    Past Surgical History:  Procedure Laterality Date  . CHOLECYSTECTOMY OPEN  1981  . COLONOSCOPY  last one 01-28-2017  . GOLD SEED IMPLANT N/A 11/16/2017   Procedure: GOLD SEED IMPLANT;  Surgeon: Cleon Gustin, MD;  Location: [facility];  Service: Urology;  Laterality: N/A;  . KNEE ARTHROSCOPY Right 1990s  .  PILONIDAL CYST EXCISION  1968  . PROSTATE BIOPSY  09/10/2017    (done in office)   + Pos for Prostate Cancer, Dr.MCkinzie( [facility])   . SPACE OAR INSTILLATION N/A 11/16/2017   Procedure: SPACE OAR INSTILLATION;  Surgeon: Cleon Gustin, MD;  Location: [facility];  Service: Urology;  Laterality: N/A;  . TOTAL HIP ARTHROPLASTY Right 06-18-2009   dr Wynelle Link  [facility]  . TOTAL KNEE ARTHROPLASTY Left 02/10/2020   Procedure: TOTAL KNEE ARTHROPLASTY;  Surgeon: Sydnee Cabal, MD;  Location: [facility];  Service: Orthopedics;  Laterality: Left;  adductor canale   Social History   Tobacco Use  . Smoking status: Former Smoker    Years: 50.00    Types: Cigarettes     Quit date: 01/03/2009    Years since quitting: 11.7  . Smokeless tobacco: Never Used  Substance Use Topics  . Alcohol use: Yes    Alcohol/week: 3.0 standard drinks    Types: 3 Cans of beer per week    Comment: 1-2 beers a week   Marital Status: Married   ROS  Review of Systems  Cardiovascular: Positive for dyspnea on exertion. Negative for chest pain and leg swelling.  Musculoskeletal: Positive for arthritis and back pain.  Gastrointestinal: Negative for change in bowel habit and melena.   Objective  Blood pressure (!) 143/61, pulse 63, resp. rate 16, height 5' 4"  (1.626 m), weight 244 lb (110.7 kg), SpO2 99 %.  Vitals with BMI 09/28/2020 09/28/2020 02/17/2020  Height - 5' 4"  -  Weight - 244 lbs -  BMI - 50.93 -  Systolic 267 124 580  Diastolic 61 78 57  Pulse 63 68 68     Physical Exam Constitutional:      Comments: He is well built and moderately obese in no acute distress.  HENT:     Head: Atraumatic.  Eyes:     Conjunctiva/sclera: Conjunctivae normal.  Neck:     Thyroid: No thyromegaly.     Vascular: No JVD.  Cardiovascular:     Rate and Rhythm: Normal rate and regular rhythm.     Pulses:          Carotid pulses are 2+ on the right side and 2+ on the left side with bruit.      Popliteal pulses are 2+ on the right side and 2+ on the left side.       Dorsalis pedis pulses are 1+ on the right side and 1+ on the left side.       Posterior tibial pulses are 0 on the right side and 0 on the left side.     Heart sounds: Normal heart sounds. No murmur heard. No gallop.      Comments: No leg edema, no JVD. Toenails are thick  with onychomycosis. Pulmonary:     Effort: Pulmonary effort is normal.     Breath sounds: Normal breath sounds.  Abdominal:     General: Bowel sounds are normal.     Palpations: Abdomen is soft.     Comments: Obese  Musculoskeletal:        General: Normal range of motion.     Cervical back: Neck supple.  Skin:    General: Skin is warm and dry.   Neurological:     Mental Status: He is alert.    Laboratory examination:   Recent Labs    02/15/20 0256 02/16/20 0231 02/17/20 0416  NA 133* 132* 136  K 4.1 4.1 4.4  CL 101 99 101  CO2 24 23 27   GLUCOSE 108* 107* 101*  BUN 20 18 19   CREATININE 0.78 0.69 0.78  CALCIUM 8.0* 8.2* 8.3*  GFRNONAA >60 >60 >60  GFRAA >60 >60 >60   CrCl cannot be calculated (Patient's most recent lab result is older than the maximum 21 days allowed.).  CMP Latest Ref Rng & Units 02/17/2020 02/16/2020 02/15/2020  Glucose 70 - 99 mg/dL 101(H) 107(H) 108(H)  BUN 8 - 23 mg/dL 19 18 20   Creatinine 0.61 - 1.24 mg/dL 0.78 0.69 0.78  Sodium 135 - 145 mmol/L 136 132(L) 133(L)  Potassium 3.5 - 5.1 mmol/L 4.4 4.1 4.1  Chloride 98 - 111 mmol/L 101 99 101  CO2 22 - 32 mmol/L 27 23 24   Calcium 8.9 - 10.3 mg/dL 8.3(L) 8.2(L) 8.0(L)  Total Protein 6.5 - 8.1 g/dL - - -  Total Bilirubin 0.3 - 1.2 mg/dL - - -  Alkaline Phos 38 - 126 U/L - - -  AST 15 - 41 U/L - - -  ALT 0 - 44 U/L - - -   CBC Latest Ref Rng & Units 02/17/2020 02/16/2020 02/16/2020  WBC 4.0 - 10.5 K/uL 7.7 - 7.0  Hemoglobin 13.0 - 17.0 g/dL 8.1(L) 8.0(L) 7.9(L)  Hematocrit 39.0 - 52.0 % 25.7(L) 25.8(L) 25.5(L)  Platelets 150 - 400 K/uL 235 - 219   Lipid Panel No results for input(s): CHOL, TRIG, LDLCALC, VLDL, HDL, CHOLHDL, LDLDIRECT in the last 8760 hours.   HEMOGLOBIN A1C Lab Results  Component Value Date   HGBA1C 6.7 (H) 02/01/2020   MPG 145.59 02/01/2020   TSH No results for input(s): TSH in the last 8760 hours.   External labs:  Lab 09/25/2020:  A1c 5.9%.  Serum glucose 137 mg, BUN 13, creatinine 0.8, EGFR >60 mL, sodium 132, potassium 4.0.  Hb 9.3/HCT 29.1, normal indicis.  Iron studies normal.  Labs 11/10/2018:  Total cholesterol 128, triglycerides 65, HDL 51, LDL 64.    TSH normal.   Medications and allergies  No Known Allergies  Current Outpatient Medications on File Prior to Visit  Medication Sig Dispense  Refill  . acetaminophen (TYLENOL) 500 MG tablet Take 1,000 mg by mouth every 6 (six) hours as needed for moderate pain.    Marland Kitchen alfuzosin (UROXATRAL) 10 MG 24 hr tablet Take 1 tablet (10 mg total) by mouth daily with breakfast. 30 tablet 5  . Blood Glucose Monitoring Suppl (ONETOUCH VERIO) w/Device KIT by Does not apply route. Check blood sugar twice daily as directed DX E11.65    . Cholecalciferol (VITAMIN D3) 50 MCG (2000 UT) TABS Take 2,000 Units by mouth daily.     Marland Kitchen lisinopril (PRINIVIL,ZESTRIL) 20 MG tablet Take 1 tablet (20 mg total) by mouth daily. 90 tablet 1  . metFORMIN (GLUCOPHAGE) 500 MG tablet TAKE 1 TABLET BY MOUTH ONCE DAILY WITH BREAKFAST (Patient taking differently: Take 500 mg by mouth daily with breakfast.) 90 tablet 1  . methocarbamol (ROBAXIN) 500 MG tablet Take 1 tablet (500 mg total) by mouth 4 (four) times daily. 40 tablet 0  . nystatin cream (MYCOSTATIN) Apply 1 application topically daily as needed (yeast).    Glory Rosebush DELICA LANCETS 16X MISC USE 1  TO CHECK GLUCOSE TWICE DAILY (Patient taking differently: 2 (two) times daily.) 100 each 12  . ONETOUCH VERIO test strip USE 1 STRIP TO CHECK GLUCOSE TWICE DAILY AS DIRECTED (Patient taking differently: 2 (two) times daily.) 100 each 11  . senna-docusate (SENOKOT-S) 8.6-50  MG tablet Take 1 tablet by mouth at bedtime.    . simvastatin (ZOCOR) 20 MG tablet TAKE 1 TABLET BY MOUTH ONCE DAILY FOR CHOLESTEROL (Patient taking differently: Take 20 mg by mouth daily.) 90 tablet 1  . traMADol (ULTRAM) 50 MG tablet Take one to two tablets by mouth every 6 hours as needed for pain. Do not take more than 6 tablets in 24 hours 4 tablet 0  . warfarin (COUMADIN) 10 MG tablet TAKE 1 TABLET BY MOUTH ONCE DAILY ALONG  WITH  A  5  MG  TABLET  FOR  A  TOTAL  OF  15  MG  DAILY (Patient taking differently: Take 10 mg by mouth See admin instructions. Take 10 mg with the 7.5 mg to equal 17.5 mg after supper on Sun/Mon/Wed/Fri/Sat and 10 mg with the 5 mg to  equal 15 mg after supper on Tues/Thurs) 90 tablet 1  . warfarin (COUMADIN) 5 MG tablet Take 5-7.5 mg by mouth See admin instructions. Take 7.5 mg with the 10 mg to equal 17.5 mg after supper on Sun/Mon/Wed/Fri/Sat and 5 mg with the 10 mg to equal 15 mg after supper on Tues/Thurs     No current facility-administered medications on file prior to visit.    Radiology:   CT angiogram chest 02/13/2020: Cardiovascular: There is moderate to marked severity calcification of the aortic arch. Satisfactory opacification of the pulmonary arteries to the segmental level. Normal heart size. No pericardial effusion. Moderate severity coronary artery calcification is seen. No evidence of pulmonary embolism. Small hiatal hernia. Multilevel degenerative changes throughout the thoracic spine.  Cardiac Studies:   Stress EKG 01/30/12: Negative for ischemia. 4:25 min. 5.5 METs. Reduced aerobic tolerence. Able to mount appropriate heart rate response.  EKG:   EKG 09/28/2020: Normal sinus rhythm at rate of 69 bpm, normal axis, right bundle branch block.  Low-voltage complexes.  Pulmonary disease pattern. No significant change from  EKG 09/21/2019   Assessment     ICD-10-CM   1. PAF (paroxysmal atrial fibrillation) (HCC)  I48.0 EKG 12-Lead  2. Dyspnea on exertion  R06.00 PCV ECHOCARDIOGRAM COMPLETE    PCV MYOCARDIAL PERFUSION WITH LEXISCAN  3. Left carotid bruit  R09.89 PCV CAROTID DUPLEX (BILATERAL)  4. Coronary artery calcification seen on CAT scan  I25.10   5. Essential hypertension  I10   CHA2DS2-VASc Score is 5.  Yearly risk of stroke: 7.2% (A, HTN, DM, Vasc Dz).  Score of 1=0.6; 2=2.2; 3=3.2; 4=4.8; 5=7.2; 6=9.8; 7=>9.8) -(CHF; HTN; vasc disease DM,  Male = 1; Age <65 =0; 65-74 = 1,  >75 =2; stroke/embolism= 2).     No orders of the defined types were placed in this encounter.  Medications Discontinued During This Encounter  Medication Reason  . diclofenac Sodium (VOLTAREN) 1 % GEL Patient  Preference  . diphenhydrAMINE (SOMINEX) 25 MG tablet Patient Preference  . enoxaparin (LOVENOX) 100 MG/ML injection No longer needed (for PRN medications)  . ondansetron (ZOFRAN) 4 MG tablet No longer needed (for PRN medications)  . OVER THE COUNTER MEDICATION Patient Preference  . oxyCODONE (ROXICODONE) 5 MG immediate release tablet Patient Preference  . tolnaftate (TINACTIN) 1 % cream No longer needed (for PRN medications)   Orders Placed This Encounter  Procedures  . PCV MYOCARDIAL PERFUSION WITH LEXISCAN    Standing Status:   Future    Standing Expiration Date:   11/26/2020    Scheduling Instructions:     2 day protocol  . EKG 12-Lead  .  PCV ECHOCARDIOGRAM COMPLETE    Standing Status:   Future    Standing Expiration Date:   09/28/2021      Recommendations:  No orders of the defined types were placed in this encounter.   DUELL HOLDREN  is a 80 y.o. 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 He was evaluated by Dr. Ignacia Palma in July 2019, there was question about atrial fibrillation. There is no documented atrial fibrillation in chart.   Since his knee replacement on the left in May 2021, his activity has decreased and still recuperating from this.  He has noticed marked dyspnea even doing minimal activity.  In view of his cardiovascular risk factors including hypertension, hyperlipidemia, diabetes mellitus and age underlying coronary disease need to be excluded, he has also had a very prominent left carotid bruit and carotid stenosis need to be evaluated.  Schedule for a Lexiscan Nuclear stress test to evaluate for myocardial ischemia. Patient unable to do treadmill  stress testing due to arthritis and dyspnea. Will schedule for an echocardiogram. Schedule for carotid duplex for bruit/follow-up surveillance of carotid stenosis. Office visit following the work-up/investigations.   I reviewed his external labs, lipids 2 years ago are well controlled and his scheduled for repeat lipid profile testing soon in March of this year.  Blood pressure is elevated, patient states that his blood pressure has been well controlled at home and he has not taken his medications hence did not want me to make any changes.  He has been anemic since surgery, labs and repeat CBC are pending soon.  No bleeding diathesis per patient's history.    [name], MD, [facility] 09/28/2020, 3:35 PM Office: [phone] Pager: [phone]

## 2020-10-01 DIAGNOSIS — Z96652 Presence of left artificial knee joint: Secondary | ICD-10-CM | POA: Diagnosis not present

## 2020-10-08 ENCOUNTER — Other Ambulatory Visit: Payer: PPO

## 2020-10-09 DIAGNOSIS — L603 Nail dystrophy: Secondary | ICD-10-CM | POA: Diagnosis not present

## 2020-10-09 DIAGNOSIS — B351 Tinea unguium: Secondary | ICD-10-CM | POA: Diagnosis not present

## 2020-10-09 DIAGNOSIS — I70213 Atherosclerosis of native arteries of extremities with intermittent claudication, bilateral legs: Secondary | ICD-10-CM | POA: Diagnosis not present

## 2020-10-09 DIAGNOSIS — L84 Corns and callosities: Secondary | ICD-10-CM | POA: Diagnosis not present

## 2020-10-10 DIAGNOSIS — M25562 Pain in left knee: Secondary | ICD-10-CM | POA: Diagnosis not present

## 2020-10-11 ENCOUNTER — Ambulatory Visit: Payer: PPO

## 2020-10-11 ENCOUNTER — Other Ambulatory Visit: Payer: Self-pay

## 2020-10-11 DIAGNOSIS — R0989 Other specified symptoms and signs involving the circulatory and respiratory systems: Secondary | ICD-10-CM | POA: Diagnosis not present

## 2020-10-11 DIAGNOSIS — R0609 Other forms of dyspnea: Secondary | ICD-10-CM

## 2020-10-11 DIAGNOSIS — R0600 Dyspnea, unspecified: Secondary | ICD-10-CM

## 2020-10-12 ENCOUNTER — Ambulatory Visit: Payer: PPO

## 2020-10-12 DIAGNOSIS — R0609 Other forms of dyspnea: Secondary | ICD-10-CM

## 2020-10-12 DIAGNOSIS — R0600 Dyspnea, unspecified: Secondary | ICD-10-CM

## 2020-10-14 ENCOUNTER — Other Ambulatory Visit: Payer: Self-pay | Admitting: Cardiology

## 2020-10-14 DIAGNOSIS — I6523 Occlusion and stenosis of bilateral carotid arteries: Secondary | ICD-10-CM

## 2020-10-15 ENCOUNTER — Other Ambulatory Visit: Payer: PPO

## 2020-10-25 DIAGNOSIS — M25562 Pain in left knee: Secondary | ICD-10-CM | POA: Diagnosis not present

## 2020-10-31 DIAGNOSIS — D6869 Other thrombophilia: Secondary | ICD-10-CM | POA: Diagnosis not present

## 2020-10-31 DIAGNOSIS — Z86718 Personal history of other venous thrombosis and embolism: Secondary | ICD-10-CM | POA: Diagnosis not present

## 2020-10-31 DIAGNOSIS — Z7901 Long term (current) use of anticoagulants: Secondary | ICD-10-CM | POA: Diagnosis not present

## 2020-10-31 DIAGNOSIS — I480 Paroxysmal atrial fibrillation: Secondary | ICD-10-CM | POA: Diagnosis not present

## 2020-11-01 DIAGNOSIS — M25562 Pain in left knee: Secondary | ICD-10-CM | POA: Diagnosis not present

## 2020-11-08 DIAGNOSIS — M25562 Pain in left knee: Secondary | ICD-10-CM | POA: Diagnosis not present

## 2020-11-09 ENCOUNTER — Ambulatory Visit: Payer: PPO | Admitting: Cardiology

## 2020-11-09 ENCOUNTER — Other Ambulatory Visit: Payer: Self-pay

## 2020-11-09 ENCOUNTER — Encounter: Payer: Self-pay | Admitting: Cardiology

## 2020-11-09 VITALS — BP 99/63 | HR 80 | Temp 98.2°F | Resp 16 | Ht 64.0 in | Wt 247.4 lb

## 2020-11-09 DIAGNOSIS — R55 Syncope and collapse: Secondary | ICD-10-CM | POA: Diagnosis not present

## 2020-11-09 DIAGNOSIS — R0609 Other forms of dyspnea: Secondary | ICD-10-CM

## 2020-11-09 DIAGNOSIS — I6523 Occlusion and stenosis of bilateral carotid arteries: Secondary | ICD-10-CM

## 2020-11-09 DIAGNOSIS — R0600 Dyspnea, unspecified: Secondary | ICD-10-CM

## 2020-11-09 DIAGNOSIS — I10 Essential (primary) hypertension: Secondary | ICD-10-CM

## 2020-11-09 NOTE — Progress Notes (Signed)
Primary Physician/Referring:  Marton Redwood, MD  Patient ID: Colton Miranda, male    DOB: 1941-02-04, 80 y.o.   MRN: [id number]  Chief Complaint  Patient presents with  . PAF  . Shortness of Breath  . Hypertension  . Follow-up    6 week   HPI:    Colton Miranda  is a 80 y.o. 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 He was evaluated by Dr. Ignacia Palma in July 2019, there was question about atrial fibrillation. There is no documented atrial fibrillation in chart.   Since his knee replacement on the left in May 2021, his activity has decreased and still recuperating from this.  He has noticed marked dyspnea even doing minimal activity. No PND or orthopnea. No bleeding diathesis on anticoagulation.  He denies chest pain or palpitations.     He was seen by me about 6 weeks ago and underwent echocardiogram and stress test and presents for follow-up.  States that about a month ago he had an episode of near syncope when he stood up to go to the bathroom.  States that he has had similar episode about a year ago.  Past Medical History:  Diagnosis Date  . BPH associated with nocturia   . Chronic constipation   . COPD (chronic obstructive pulmonary disease) ([place])   . Eczema   . Heart murmur    at birth  . History of adenomatous polyp of colon   . History of basal cell carcinoma (BCC) of skin    post excision from trunk  . 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 20 years ago , 1999, caused by blood clot after knee scope"  DVT to PE  . History of depression    over 40 years ago nervous breakdown   . History of DVT (deep vein thrombosis)    11-06-2017 per pt and wife happened lower leg approx. 1999 after knee scope  . History of pulmonary embolus (PE)    11-06-2017 from DVT in approx. 1999 per wife and pt  . HOH (hard of hearing)   . Hyperlipidemia   . Hypertension    followed by pcp  . Long term (current) use of anticoagulants Coumadin-- followed by Pharmasist w/ [facility]   secondary to primary hypercoagulopathy w/ hx DVT and PE  . Myalgia and myositis, unspecified   . OA (osteoarthritis)    "all joints"  . PAF (paroxysmal atrial fibrillation) ([place]) 2019  . Peripheral neuropathy   . Pinched nerve in neck    per pt causes intermittant numbness bilateral arms and hands  . Primary hypercoagulable state ([place])   . Prostate cancer [facility]) urologist-  dr Alyson Ingles  oncologist-- dr Tammi Klippel   dx 08-28-2017  Stage T1c, Gleason 4+4, PSA 14.1, vol 42.8cc-- planned treatment external radiation and ADT  . 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 Protein S deficiency ([place])   . Right bundle branch block   . Seborrheic dermatitis, unspecified   . Stroke ([place])   . Type 2 diabetes mellitus ([place])   . Urinary frequency   . Wears glasses    Past Surgical History:  Procedure Laterality Date  . CHOLECYSTECTOMY OPEN  1981  . COLONOSCOPY  last one 01-28-2017  . GOLD SEED IMPLANT N/A 11/16/2017   Procedure: GOLD SEED IMPLANT;  Surgeon: Cleon Gustin, MD;  Location: [facility];  Service: Urology;  Laterality: N/A;  . KNEE ARTHROSCOPY Right 1990s  . PILONIDAL CYST EXCISION  1968  . PROSTATE BIOPSY  09/10/2017    (done in office)   + Pos for Prostate Cancer, Dr.MCkinzie( [facility])   . SPACE OAR INSTILLATION N/A 11/16/2017   Procedure: SPACE OAR INSTILLATION;  Surgeon: Cleon Gustin, MD;  Location: [facility];  Service: Urology;  Laterality: N/A;  . TOTAL HIP ARTHROPLASTY Right 06-18-2009   dr Wynelle Link  [facility]  . TOTAL KNEE ARTHROPLASTY Left  02/10/2020   Procedure: TOTAL KNEE ARTHROPLASTY;  Surgeon: Sydnee Cabal, MD;  Location: [facility];  Service: Orthopedics;  Laterality: Left;  adductor canale   Social History   Tobacco Use  . Smoking status: Former Smoker    Years: 50.00    Types: Cigarettes    Quit date: 01/03/2009    Years since quitting: 11.8  . Smokeless tobacco: Never Used  Substance Use Topics  . Alcohol use: Yes    Alcohol/week: 3.0 standard drinks    Types: 3 Cans of beer per week    Comment: 1-2 beers a week   Marital Status: Married   ROS  Review of Systems  Cardiovascular: Positive for dyspnea on exertion. Negative for chest pain and leg swelling.  Musculoskeletal: Positive for arthritis and back pain.  Gastrointestinal: Negative for change in bowel habit and melena.   Objective  Blood pressure 99/63, pulse 80, temperature 98.2 F (36.8 C), temperature source Temporal, resp. rate 16, height 5' 4"  (1.626 m), weight 247 lb 6.4 oz (112.2 kg), SpO2 99 %.  Vitals with BMI 11/09/2020 09/28/2020 09/28/2020  Height 5' 4"  - 5' 4"   Weight 247 lbs 6 oz - 244 lbs  BMI 41.74 - 08.14  Systolic 99 481 856  Diastolic 63 61 78  Pulse 80 63 68     Physical Exam Constitutional:      Comments: He is well built and moderately obese in no acute distress.  HENT:     Head: Atraumatic.  Eyes:     Conjunctiva/sclera: Conjunctivae normal.  Neck:     Thyroid: No thyromegaly.     Vascular: No JVD.  Cardiovascular:     Rate and Rhythm: Normal rate and regular rhythm.     Pulses:          Carotid pulses are 2+ on the right side and 2+ on the left side with bruit.      Popliteal pulses are 2+ on the right side and 2+ on the left side.       Dorsalis pedis pulses are 1+ on the right side and 1+ on the left side.       Posterior tibial pulses are 0 on the right side and 0 on the left side.     Heart sounds: Normal heart sounds. No murmur heard. No gallop.      Comments: No leg edema, no JVD. Toenails are thick  with  onychomycosis. Pulmonary:     Effort: Pulmonary effort is normal.     Breath sounds: Normal breath sounds.  Abdominal:     General: Bowel sounds are normal.     Palpations: Abdomen is soft.     Comments: Obese  Musculoskeletal:  General: Normal range of motion.     Cervical back: Neck supple.  Skin:    General: Skin is warm and dry.  Neurological:     Mental Status: He is alert.    Laboratory examination:   Recent Labs    02/15/20 0256 02/16/20 0231 02/17/20 0416  NA 133* 132* 136  K 4.1 4.1 4.4  CL 101 99 101  CO2 24 23 27   GLUCOSE 108* 107* 101*  BUN 20 18 19   CREATININE 0.78 0.69 0.78  CALCIUM 8.0* 8.2* 8.3*  GFRNONAA >60 >60 >60  GFRAA >60 >60 >60   CrCl cannot be calculated (Patient's most recent lab result is older than the maximum 21 days allowed.).  CMP Latest Ref Rng & Units 02/17/2020 02/16/2020 02/15/2020  Glucose 70 - 99 mg/dL 101(H) 107(H) 108(H)  BUN 8 - 23 mg/dL 19 18 20   Creatinine 0.61 - 1.24 mg/dL 0.78 0.69 0.78  Sodium 135 - 145 mmol/L 136 132(L) 133(L)  Potassium 3.5 - 5.1 mmol/L 4.4 4.1 4.1  Chloride 98 - 111 mmol/L 101 99 101  CO2 22 - 32 mmol/L 27 23 24   Calcium 8.9 - 10.3 mg/dL 8.3(L) 8.2(L) 8.0(L)  Total Protein 6.5 - 8.1 g/dL - - -  Total Bilirubin 0.3 - 1.2 mg/dL - - -  Alkaline Phos 38 - 126 U/L - - -  AST 15 - 41 U/L - - -  ALT 0 - 44 U/L - - -   CBC Latest Ref Rng & Units 02/17/2020 02/16/2020 02/16/2020  WBC 4.0 - 10.5 K/uL 7.7 - 7.0  Hemoglobin 13.0 - 17.0 g/dL 8.1(L) 8.0(L) 7.9(L)  Hematocrit 39.0 - 52.0 % 25.7(L) 25.8(L) 25.5(L)  Platelets 150 - 400 K/uL 235 - 219   Lipid Panel No results for input(s): CHOL, TRIG, LDLCALC, VLDL, HDL, CHOLHDL, LDLDIRECT in the last 8760 hours.   HEMOGLOBIN A1C Lab Results  Component Value Date   HGBA1C 6.7 (H) 02/01/2020   MPG 145.59 02/01/2020   TSH No results for input(s): TSH in the last 8760 hours.   External labs:  Lab 09/25/2020:  A1c 5.9%.  Serum glucose 137 mg, BUN 13,  creatinine 0.8, EGFR >60 mL, sodium 132, potassium 4.0.  Hb 9.3/HCT 29.1, normal indicis.  Iron studies normal.  Labs 11/17/2019:  Total cholesterol 121, triglycerides 58, HDL 52, LDL 57.   Medications and allergies  No Known Allergies  Current Outpatient Medications on File Prior to Visit  Medication Sig Dispense Refill  . acetaminophen (TYLENOL) 500 MG tablet Take 1,000 mg by mouth every 6 (six) hours as needed for moderate pain.    Marland Kitchen alfuzosin (UROXATRAL) 10 MG 24 hr tablet Take 1 tablet (10 mg total) by mouth daily with breakfast. 30 tablet 5  . Blood Glucose Monitoring Suppl (ONETOUCH VERIO) w/Device KIT by Does not apply route. Check blood sugar twice daily as directed DX E11.65    . Cholecalciferol (VITAMIN D3) 50 MCG (2000 UT) TABS Take 2,000 Units by mouth daily.     Marland Kitchen gabapentin (NEURONTIN) 300 MG capsule Take 1 capsule by mouth daily.    Marland Kitchen lisinopril (PRINIVIL,ZESTRIL) 20 MG tablet Take 1 tablet (20 mg total) by mouth daily. 90 tablet 1  . metFORMIN (GLUCOPHAGE) 500 MG tablet TAKE 1 TABLET BY MOUTH ONCE DAILY WITH BREAKFAST (Patient taking differently: Take 500 mg by mouth daily with breakfast.) 90 tablet 1  . methocarbamol (ROBAXIN) 500 MG tablet Take 1 tablet (500 mg total) by mouth 4 (four) times daily.  40 tablet 0  . nystatin cream (MYCOSTATIN) Apply 1 application topically daily as needed (yeast).    Glory Rosebush DELICA LANCETS 60Y MISC USE 1  TO CHECK GLUCOSE TWICE DAILY (Patient taking differently: 2 (two) times daily.) 100 each 12  . ONETOUCH VERIO test strip USE 1 STRIP TO CHECK GLUCOSE TWICE DAILY AS DIRECTED (Patient taking differently: 2 (two) times daily.) 100 each 11  . senna-docusate (SENOKOT-S) 8.6-50 MG tablet Take 1 tablet by mouth at bedtime.    . simvastatin (ZOCOR) 20 MG tablet TAKE 1 TABLET BY MOUTH ONCE DAILY FOR CHOLESTEROL (Patient taking differently: Take 20 mg by mouth daily.) 90 tablet 1  . traMADol (ULTRAM) 50 MG tablet Take one to two tablets by  mouth every 6 hours as needed for pain. Do not take more than 6 tablets in 24 hours 4 tablet 0  . warfarin (COUMADIN) 10 MG tablet TAKE 1 TABLET BY MOUTH ONCE DAILY ALONG  WITH  A  5  MG  TABLET  FOR  A  TOTAL  OF  15  MG  DAILY (Patient taking differently: Take 10 mg by mouth See admin instructions. Take 10 mg with the 7.5 mg to equal 17.5 mg after supper on Sun/Mon/Wed/Fri/Sat and 10 mg with the 5 mg to equal 15 mg after supper on Tues/Thurs) 90 tablet 1  . warfarin (COUMADIN) 5 MG tablet Take 5-7.5 mg by mouth See admin instructions. Take 7.5 mg with the 10 mg to equal 17.5 mg after supper on Sun/Mon/Wed/Fri/Sat and 5 mg with the 10 mg to equal 15 mg after supper on Tues/Thurs     No current facility-administered medications on file prior to visit.    Radiology:   CT angiogram chest 02/13/2020: Cardiovascular: There is moderate to marked severity calcification of the aortic arch. Satisfactory opacification of the pulmonary arteries to the segmental level. Normal heart size. No pericardial effusion. Moderate severity coronary artery calcification is seen. No evidence of pulmonary embolism. Small hiatal hernia. Multilevel degenerative changes throughout the thoracic spine.  Cardiac Studies:   Lexiscan Tetrofosmin Stress Test 10/12/2020: Nondiagnostic ECG stress. Moderate degree medium extent fixed perfusion defect located in the apical inferior wall, mid inferior wall and basal inferior wall consistent with diaphragmatic attenuation. Minimal lateral ischemia cannot be excluded. Gated SPECT imaging of the left ventricle was normal. All segments of left ventricle demonstrated normal wall motion and thickening. Stress LV EF is normal 58%.  No previous exam available for comparison. Low risk.   Echocardiogram 10/11/2020: Normal LV systolic function with visual EF 55-60%. Left ventricle cavity is normal in size. Normal global wall motion. Moderate left ventricular hypertrophy. Doppler evidence of  grade II (pseudonormal) diastolic dysfunction, elevated LAP.  Left atrial cavity is moderately dilated. Aortic valve sclerosis without stenosis. Mild (Grade I) aortic regurgitation. Mild (Grade I) mitral regurgitation. Mild tricuspid regurgitation. No evidence of pulmonary hypertension. No prior study for comparison.  Carotid artery duplex 10/12/2020 Stenosis in the right internal carotid artery (1-15%) due to heterogeneous plaque. Stenosis in the right common carotid artery and bifurcation (<50%). Antegrade right vertebral artery flow.  Mild stenosis in the left proximal internal carotid artery (1-15%) due to heterogenous plaque. Mid to distal LICA not visualized (neck immobile).  Stenosis in the left common carotid artery (<50%). Stenosis in the left external carotid artery (<50%). Left vertebral artery not well visualized; however, by doppler predominantly antegrade flow. No prior studies for comparison. Follow up in one year is appropriate if clinically indicated.  EKG:   EKG 11/09/2020: Normal sinus rhythm at rate of 66 bpm with sinus arrhythmia.  Borderline left atrial enlargement.  Normal axis.  Right bundle branch block.  Low-voltage complexes.   No significant change from EKG 09/28/2020, 09/21/2019   Assessment     ICD-10-CM   1. Carotid stenosis, asymptomatic, bilateral  I65.23   2. Dyspnea on exertion  R06.00   3. Essential hypertension  I10 EKG 12-Lead  4. Vasovagal episode  R55    CHA2DS2-VASc Score is 5.  Yearly risk of stroke: 7.2% (A, HTN, DM, Vasc Dz).  Score of 1=0.6; 2=2.2; 3=3.2; 4=4.8; 5=7.2; 6=9.8; 7=>9.8) -(CHF; HTN; vasc disease DM,  Male = 1; Age <65 =0; 65-74 = 1,  >75 =2; stroke/embolism= 2).     No orders of the defined types were placed in this encounter. There are no discontinued medications. Orders Placed This Encounter  Procedures  . EKG 12-Lead      Recommendations:    Colton Miranda  is a 80 y.o. 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 He was evaluated by Dr. Ignacia Palma in July 2019, there was question about atrial fibrillation. There is no documented atrial fibrillation in chart.   Since his knee replacement on the left in May 2021, his activity has decreased and still recuperating from this.  He has noticed marked dyspnea even doing minimal activity.   I reviewed the results of the stress test and echocardiogram which are essentially low risk.  Carotid artery duplex discussed, he has mild to moderate plaque in bilateral carotid arteries and we will do recheck in a year.  There is no right ventricular strain or pulmonary hypertension to suggest pulmonary embolism to be the etiology.  I suspect his shortness of breath is related to deconditioning and obesity.  Weight loss discussed extensively.  With regard to near syncope, suspect vasovagal episodes.  I do not think he has sinus node dysfunction or AV nodal disease but this cannot be excluded in view of his age.  I discussed counterpressure maneuvers and fall avoidance.  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  Iron studies have been normal.  Would consider referral to hematology if not already performed.  I will see him back in 6 months.   [name], MD, [facility] 11/10/2020, 9:18 AM Office: [phone] Pager: [phone]

## 2020-11-12 DIAGNOSIS — Z96652 Presence of left artificial knee joint: Secondary | ICD-10-CM | POA: Diagnosis not present

## 2020-11-22 DIAGNOSIS — E785 Hyperlipidemia, unspecified: Secondary | ICD-10-CM | POA: Diagnosis not present

## 2020-11-22 DIAGNOSIS — Z125 Encounter for screening for malignant neoplasm of prostate: Secondary | ICD-10-CM | POA: Diagnosis not present

## 2020-11-22 DIAGNOSIS — E1169 Type 2 diabetes mellitus with other specified complication: Secondary | ICD-10-CM | POA: Diagnosis not present

## 2020-11-27 DIAGNOSIS — C61 Malignant neoplasm of prostate: Secondary | ICD-10-CM | POA: Diagnosis not present

## 2020-11-30 DIAGNOSIS — Z7901 Long term (current) use of anticoagulants: Secondary | ICD-10-CM | POA: Diagnosis not present

## 2020-11-30 DIAGNOSIS — R82998 Other abnormal findings in urine: Secondary | ICD-10-CM | POA: Diagnosis not present

## 2020-11-30 DIAGNOSIS — I77811 Abdominal aortic ectasia: Secondary | ICD-10-CM | POA: Diagnosis not present

## 2020-11-30 DIAGNOSIS — I480 Paroxysmal atrial fibrillation: Secondary | ICD-10-CM | POA: Diagnosis not present

## 2020-11-30 DIAGNOSIS — Z1212 Encounter for screening for malignant neoplasm of rectum: Secondary | ICD-10-CM | POA: Diagnosis not present

## 2020-11-30 DIAGNOSIS — G2581 Restless legs syndrome: Secondary | ICD-10-CM | POA: Diagnosis not present

## 2020-11-30 DIAGNOSIS — D692 Other nonthrombocytopenic purpura: Secondary | ICD-10-CM | POA: Diagnosis not present

## 2020-11-30 DIAGNOSIS — Z0000 Encounter for general adult medical examination without abnormal findings: Secondary | ICD-10-CM | POA: Diagnosis not present

## 2020-11-30 DIAGNOSIS — Z1331 Encounter for screening for depression: Secondary | ICD-10-CM | POA: Diagnosis not present

## 2020-11-30 DIAGNOSIS — G3184 Mild cognitive impairment, so stated: Secondary | ICD-10-CM | POA: Diagnosis not present

## 2020-11-30 DIAGNOSIS — E669 Obesity, unspecified: Secondary | ICD-10-CM | POA: Diagnosis not present

## 2020-11-30 DIAGNOSIS — Z1389 Encounter for screening for other disorder: Secondary | ICD-10-CM | POA: Diagnosis not present

## 2020-11-30 DIAGNOSIS — D649 Anemia, unspecified: Secondary | ICD-10-CM | POA: Diagnosis not present

## 2020-11-30 DIAGNOSIS — E1169 Type 2 diabetes mellitus with other specified complication: Secondary | ICD-10-CM | POA: Diagnosis not present

## 2020-11-30 DIAGNOSIS — E785 Hyperlipidemia, unspecified: Secondary | ICD-10-CM | POA: Diagnosis not present

## 2020-11-30 DIAGNOSIS — R7989 Other specified abnormal findings of blood chemistry: Secondary | ICD-10-CM | POA: Diagnosis not present

## 2020-11-30 DIAGNOSIS — I10 Essential (primary) hypertension: Secondary | ICD-10-CM | POA: Diagnosis not present

## 2020-12-03 ENCOUNTER — Other Ambulatory Visit: Payer: Self-pay | Admitting: Internal Medicine

## 2020-12-03 DIAGNOSIS — F17201 Nicotine dependence, unspecified, in remission: Secondary | ICD-10-CM

## 2020-12-04 DIAGNOSIS — C61 Malignant neoplasm of prostate: Secondary | ICD-10-CM | POA: Diagnosis not present

## 2020-12-04 DIAGNOSIS — R351 Nocturia: Secondary | ICD-10-CM | POA: Diagnosis not present

## 2020-12-04 DIAGNOSIS — L900 Lichen sclerosus et atrophicus: Secondary | ICD-10-CM | POA: Diagnosis not present

## 2020-12-04 DIAGNOSIS — R3915 Urgency of urination: Secondary | ICD-10-CM | POA: Diagnosis not present

## 2020-12-18 DIAGNOSIS — L84 Corns and callosities: Secondary | ICD-10-CM | POA: Diagnosis not present

## 2020-12-18 DIAGNOSIS — E1151 Type 2 diabetes mellitus with diabetic peripheral angiopathy without gangrene: Secondary | ICD-10-CM | POA: Diagnosis not present

## 2020-12-18 DIAGNOSIS — B351 Tinea unguium: Secondary | ICD-10-CM | POA: Diagnosis not present

## 2020-12-18 DIAGNOSIS — L603 Nail dystrophy: Secondary | ICD-10-CM | POA: Diagnosis not present

## 2021-01-01 DIAGNOSIS — Z86718 Personal history of other venous thrombosis and embolism: Secondary | ICD-10-CM | POA: Diagnosis not present

## 2021-01-01 DIAGNOSIS — E1169 Type 2 diabetes mellitus with other specified complication: Secondary | ICD-10-CM | POA: Diagnosis not present

## 2021-01-01 DIAGNOSIS — D6869 Other thrombophilia: Secondary | ICD-10-CM | POA: Diagnosis not present

## 2021-01-01 DIAGNOSIS — I480 Paroxysmal atrial fibrillation: Secondary | ICD-10-CM | POA: Diagnosis not present

## 2021-01-01 DIAGNOSIS — Z7901 Long term (current) use of anticoagulants: Secondary | ICD-10-CM | POA: Diagnosis not present

## 2021-01-21 DIAGNOSIS — H9193 Unspecified hearing loss, bilateral: Secondary | ICD-10-CM | POA: Diagnosis not present

## 2021-01-21 DIAGNOSIS — H6123 Impacted cerumen, bilateral: Secondary | ICD-10-CM | POA: Diagnosis not present

## 2021-01-28 ENCOUNTER — Other Ambulatory Visit: Payer: Self-pay | Admitting: Urology

## 2021-01-31 DIAGNOSIS — D6869 Other thrombophilia: Secondary | ICD-10-CM | POA: Diagnosis not present

## 2021-01-31 DIAGNOSIS — Z7901 Long term (current) use of anticoagulants: Secondary | ICD-10-CM | POA: Diagnosis not present

## 2021-01-31 DIAGNOSIS — E1169 Type 2 diabetes mellitus with other specified complication: Secondary | ICD-10-CM | POA: Diagnosis not present

## 2021-01-31 DIAGNOSIS — I480 Paroxysmal atrial fibrillation: Secondary | ICD-10-CM | POA: Diagnosis not present

## 2021-01-31 DIAGNOSIS — Z86718 Personal history of other venous thrombosis and embolism: Secondary | ICD-10-CM | POA: Diagnosis not present

## 2021-02-26 DIAGNOSIS — Z86718 Personal history of other venous thrombosis and embolism: Secondary | ICD-10-CM | POA: Diagnosis not present

## 2021-02-26 DIAGNOSIS — I480 Paroxysmal atrial fibrillation: Secondary | ICD-10-CM | POA: Diagnosis not present

## 2021-02-26 DIAGNOSIS — E1169 Type 2 diabetes mellitus with other specified complication: Secondary | ICD-10-CM | POA: Diagnosis not present

## 2021-02-26 DIAGNOSIS — D6869 Other thrombophilia: Secondary | ICD-10-CM | POA: Diagnosis not present

## 2021-02-26 DIAGNOSIS — Z7901 Long term (current) use of anticoagulants: Secondary | ICD-10-CM | POA: Diagnosis not present

## 2021-03-19 DIAGNOSIS — L603 Nail dystrophy: Secondary | ICD-10-CM | POA: Diagnosis not present

## 2021-03-19 DIAGNOSIS — B351 Tinea unguium: Secondary | ICD-10-CM | POA: Diagnosis not present

## 2021-03-19 DIAGNOSIS — L84 Corns and callosities: Secondary | ICD-10-CM | POA: Diagnosis not present

## 2021-03-19 DIAGNOSIS — E1151 Type 2 diabetes mellitus with diabetic peripheral angiopathy without gangrene: Secondary | ICD-10-CM | POA: Diagnosis not present

## 2021-03-28 DIAGNOSIS — Z7901 Long term (current) use of anticoagulants: Secondary | ICD-10-CM | POA: Diagnosis not present

## 2021-03-28 DIAGNOSIS — D6869 Other thrombophilia: Secondary | ICD-10-CM | POA: Diagnosis not present

## 2021-03-28 DIAGNOSIS — Z86718 Personal history of other venous thrombosis and embolism: Secondary | ICD-10-CM | POA: Diagnosis not present

## 2021-03-28 DIAGNOSIS — I480 Paroxysmal atrial fibrillation: Secondary | ICD-10-CM | POA: Diagnosis not present

## 2021-04-30 ENCOUNTER — Other Ambulatory Visit: Payer: Self-pay | Admitting: Urology

## 2021-04-30 DIAGNOSIS — E1169 Type 2 diabetes mellitus with other specified complication: Secondary | ICD-10-CM | POA: Diagnosis not present

## 2021-04-30 DIAGNOSIS — Z86718 Personal history of other venous thrombosis and embolism: Secondary | ICD-10-CM | POA: Diagnosis not present

## 2021-04-30 DIAGNOSIS — D6869 Other thrombophilia: Secondary | ICD-10-CM | POA: Diagnosis not present

## 2021-04-30 DIAGNOSIS — Z7901 Long term (current) use of anticoagulants: Secondary | ICD-10-CM | POA: Diagnosis not present

## 2021-04-30 DIAGNOSIS — I480 Paroxysmal atrial fibrillation: Secondary | ICD-10-CM | POA: Diagnosis not present

## 2021-05-09 ENCOUNTER — Other Ambulatory Visit: Payer: Self-pay

## 2021-05-09 ENCOUNTER — Ambulatory Visit: Payer: PPO | Admitting: Cardiology

## 2021-05-09 ENCOUNTER — Encounter: Payer: Self-pay | Admitting: Cardiology

## 2021-05-09 VITALS — BP 136/75 | HR 91 | Temp 98.9°F | Resp 16 | Ht 64.0 in | Wt 250.0 lb

## 2021-05-09 DIAGNOSIS — D500 Iron deficiency anemia secondary to blood loss (chronic): Secondary | ICD-10-CM

## 2021-05-09 DIAGNOSIS — I480 Paroxysmal atrial fibrillation: Secondary | ICD-10-CM | POA: Diagnosis not present

## 2021-05-09 DIAGNOSIS — I10 Essential (primary) hypertension: Secondary | ICD-10-CM | POA: Diagnosis not present

## 2021-05-09 DIAGNOSIS — D6859 Other primary thrombophilia: Secondary | ICD-10-CM

## 2021-05-09 DIAGNOSIS — R0609 Other forms of dyspnea: Secondary | ICD-10-CM

## 2021-05-09 DIAGNOSIS — R0600 Dyspnea, unspecified: Secondary | ICD-10-CM

## 2021-05-09 NOTE — Progress Notes (Signed)
Primary Physician/Referring:  Ginger Organ., MD  Patient ID: Elizbeth Squires, male    DOB: 05/17/1941, 80 y.o.   MRN: [id number]  Chief Complaint  Patient presents with   Shortness of Breath   Carotid Disease   Follow-up    6 months   HPI:    JAYMZ TRAYWICK  is a 80 y.o. 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 He was evaluated by Dr. Ignacia Palma in July 2019, there was question about atrial fibrillation. There is no documented atrial fibrillation in chart.   Since his knee replacement on the left in May 2021, he has had low blood counts. 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  Iron studies have been normal.   He continues to not be too concerned about his diet, snacks frequently, has very limited activity and uses a walker.  He has no specific complaints except for chronic dyspnea.  No leg edema, no PND or orthopnea.  He has not had any bleeding diathesis from Coumadin, no black stools.  He has not had any palpitations.    Past Medical History:  Diagnosis Date   BPH associated with nocturia    Chronic constipation    COPD (chronic obstructive pulmonary disease) (HCC)    Eczema    Heart murmur    at birth   History of adenomatous polyp of colon    History of basal cell carcinoma (BCC) of skin    post excision from trunk   History of CVA (cerebrovascular accident) without residual deficits    11-06-2017 per pt and wife "stroke was approx. 20 years ago , 1999, caused by blood clot after knee scope"  DVT to PE   History of depression    over 40 years ago nervous breakdown   History of DVT (deep vein thrombosis)    11-06-2017 per pt and wife  happened lower leg approx. 1999 after knee scope   History of pulmonary embolus (PE)    11-06-2017 from DVT in approx. 1999 per wife and pt   HOH (hard of hearing)    Hyperlipidemia    Hypertension    followed by pcp   Long term (current) use of anticoagulants Coumadin-- followed by Pharmasist w/ [facility]   secondary to primary hypercoagulopathy w/ hx DVT and PE   Myalgia and myositis, unspecified    OA (osteoarthritis)    "all joints"   PAF (paroxysmal atrial fibrillation) ([place]) 2019   Peripheral neuropathy    Pinched nerve in neck    per pt causes intermittant numbness bilateral arms and hands   Primary hypercoagulable state ([place])    Prostate cancer [facility]) urologist-  dr Alyson Ingles  oncologist-- dr Tammi Klippel   dx 08-28-2017  Stage T1c, Gleason 4+4, PSA 14.1, vol 42.8cc-- planned treatment external radiation and ADT   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[place])    Protein S deficiency ([place])    Right bundle branch block    Seborrheic dermatitis, unspecified    Stroke ([place])    Type 2 diabetes mellitus ([place])    Urinary frequency    Wears glasses    Past Surgical History:  Procedure Laterality Date   [place]  last one 01-28-2017   GOLD SEED IMPLANT N/A 11/16/2017   Procedure: GOLD SEED IMPLANT;  Surgeon: Cleon Gustin, MD;  Location: [facility];  Service: Urology;  Laterality: N/A;   KNEE ARTHROSCOPY Right 1990s   PILONIDAL CYST EXCISION  1968   PROSTATE BIOPSY  09/10/2017    (done in office)   + Pos for Prostate Cancer, Dr.MCkinzie( [facility])    SPACE OAR INSTILLATION N/A 11/16/2017   Procedure: SPACE OAR INSTILLATION;  Surgeon: Cleon Gustin, MD;  Location: [facility];  Service: Urology;  Laterality: N/A;   TOTAL HIP ARTHROPLASTY Right 06-18-2009   dr Wynelle Link  [facility]   TOTAL KNEE ARTHROPLASTY Left 02/10/2020   Procedure: TOTAL KNEE ARTHROPLASTY;  Surgeon: Sydnee Cabal, MD;  Location: [facility];  Service:  Orthopedics;  Laterality: Left;  adductor canale   Family History  Problem Relation Age of Onset   Cancer Mother 14       colon , breast   Colon cancer Mother    Parkinson's disease Sister    Cancer Sister        breast   Cancer Maternal Uncle        melanoma   Cancer Paternal Uncle    Heart attack Daughter        2008   Social History   Tobacco Use   Smoking status: Former    Packs/day: 0.50    Years: 50.00    Pack years: 25.00    Types: Cigarettes    Quit date: 01/03/2009    Years since quitting: 12.3   Smokeless tobacco: Never  Substance Use Topics   Alcohol use: Yes    Alcohol/week: 3.0 standard drinks    Types: 3 Cans of beer per week    Comment: 1-2 beers a week   Marital Status: Married   ROS  Review of Systems  Cardiovascular:  Positive for dyspnea on exertion. Negative for chest pain and leg swelling.  Musculoskeletal:  Positive for arthritis and back pain.  Gastrointestinal:  Negative for change in bowel habit and melena.  Objective  Blood pressure 136/75, pulse 91, temperature 98.9 F (37.2 C), temperature source Temporal, resp. rate 16, height _0  (1.626 m), weight 250 lb (113.4 kg), SpO2 96 %.  Vitals with BMI 05/09/2021 11/09/2020 09/28/2020  Height _1  _2  -  Weight 250 lbs 247 lbs 6 oz -  BMI 54.62 70.35 -  Systolic 009 99 381  Diastolic 75 63 61  Pulse 91 80 63     Physical Exam Constitutional:      Comments: He is well built and moderately obese in no acute distress.  HENT:     Head: Atraumatic.  Eyes:     Conjunctiva/sclera: Conjunctivae normal.  Neck:     Thyroid: No thyromegaly.     Vascular: No JVD.  Cardiovascular:     Rate and Rhythm: Normal rate and regular rhythm.     Pulses:          Carotid pulses are 2+ on the right side and 2+ on the left side with bruit.      Popliteal pulses are 2+ on the right side and 2+ on the left side.       Dorsalis pedis pulses are 1+ on the right side and 1+ on the left side.       Posterior  tibial pulses are 0 on the right side and 0 on the left side.  Heart sounds: Normal heart sounds. No murmur heard.   No gallop.  Pulmonary:     Effort: Pulmonary effort is normal.     Breath sounds: Normal breath sounds.  Abdominal:     General: Bowel sounds are normal.     Palpations: Abdomen is soft.     Comments: Obese  Musculoskeletal:        General: Normal range of motion.     Cervical back: No edema.  Skin:    General: Skin is warm and dry.     Comments: Toenails are thick  with onychomycosis.  Neurological:     Mental Status: He is alert.   Laboratory examination:   No results for input(s): NA, K, CL, CO2, GLUCOSE, BUN, CREATININE, CALCIUM, GFRNONAA, GFRAA in the last 8760 hours.  CrCl cannot be calculated (Patient's most recent lab result is older than the maximum 21 days allowed.).  CMP Latest Ref Rng & Units 02/17/2020 02/16/2020 02/15/2020  Glucose 70 - 99 mg/dL 101(H) 107(H) 108(H)  BUN 8 - 23 mg/dL _0 Creatinine 0.61 - 1.24 mg/dL 0.78 0.69 0.78  Sodium 135 - 145 mmol/L 136 132(L) 133(L)  Potassium 3.5 - 5.1 mmol/L 4.4 4.1 4.1  Chloride 98 - 111 mmol/L 101 99 101  CO2 22 - 32 mmol/L _1 Calcium 8.9 - 10.3 mg/dL 8.3(L) 8.2(L) 8.0(L)  Total Protein 6.5 - 8.1 g/dL - - -  Total Bilirubin 0.3 - 1.2 mg/dL - - -  Alkaline Phos 38 - 126 U/L - - -  AST 15 - 41 U/L - - -  ALT 0 - 44 U/L - - -   CBC Latest Ref Rng & Units 02/17/2020 02/16/2020 02/16/2020  WBC 4.0 - 10.5 K/uL 7.7 - 7.0  Hemoglobin 13.0 - 17.0 g/dL 8.1(L) 8.0(L) 7.9(L)  Hematocrit 39.0 - 52.0 % 25.7(L) 25.8(L) 25.5(L)  Platelets 150 - 400 K/uL 235 - 219   Lipid Panel No results for input(s): CHOL, TRIG, LDLCALC, VLDL, HDL, CHOLHDL, LDLDIRECT in the last 8760 hours.   HEMOGLOBIN A1C Lab Results  Component Value Date   HGBA1C 6.7 (H) 02/01/2020   MPG 145.59 02/01/2020   TSH No results for input(s): TSH in the last 8760 hours.   External labs:  Cholesterol, total 116.000 m  11/22/2020 HDL 52.000 mg 11/22/2020 LDL 53.000 mg 11/22/2020 Triglycerides 57.000 mg 11/22/2020  A1C 6.400 % 11/22/2020 TSH 2.000 11/22/2020  Hemoglobin 10.100 g/d 04/19/2020  Creatinine, Serum 0.900 mg/ 04/19/2020 Potassium 4.700 mEq 11/22/2020 Magnesium N/D ALT (SGPT) 15.000 IU/ 11/22/2020   Allergies  No Known Allergies    Medications Prior to Visit:   Outpatient Medications Prior to Visit  Medication Sig Dispense Refill   alfuzosin (UROXATRAL) 10 MG 24 hr tablet TAKE 1 TABLET BY MOUTH IN THE MORNING WITH BREAKFAST 90 tablet 0   Blood Glucose Monitoring Suppl (ONETOUCH VERIO) w/Device KIT by Does not apply route. Check blood sugar twice daily as directed DX E11.65     Cholecalciferol (VITAMIN D3) 50 MCG (2000 UT) TABS Take 2,000 Units by mouth daily.      lisinopril (PRINIVIL,ZESTRIL) 20 MG tablet Take 1 tablet (20 mg total) by mouth daily. 90 tablet 1   metFORMIN (GLUCOPHAGE) 500 MG tablet TAKE 1 TABLET BY MOUTH ONCE DAILY WITH BREAKFAST (Patient taking differently: Take 500 mg by mouth daily with breakfast.) 90 tablet 1   nystatin cream (MYCOSTATIN) Apply 1 application topically daily as needed (yeast).     [facility] DELICA  LANCETS 33G MISC USE 1  TO CHECK GLUCOSE TWICE DAILY (Patient taking differently: 2 (two) times daily.) 100 each 12   ONETOUCH VERIO test strip USE 1 STRIP TO CHECK GLUCOSE TWICE DAILY AS DIRECTED (Patient taking differently: 2 (two) times daily.) 100 each 11   senna-docusate (SENOKOT-S) 8.6-50 MG tablet Take 1 tablet by mouth at bedtime.     simvastatin (ZOCOR) 20 MG tablet TAKE 1 TABLET BY MOUTH ONCE DAILY FOR CHOLESTEROL (Patient taking differently: Take 20 mg by mouth daily.) 90 tablet 1   traMADol (ULTRAM) 50 MG tablet Take one to two tablets by mouth every 6 hours as needed for pain. Do not take more than 6 tablets in 24 hours 4 tablet 0   warfarin (COUMADIN) 10 MG tablet TAKE 1 TABLET BY MOUTH ONCE DAILY ALONG  WITH  A  5  MG  TABLET  FOR  A  TOTAL  OF  15  MG  DAILY  (Patient taking differently: Take 10 mg by mouth See admin instructions. Take 10 mg with the 7.5 mg to equal 17.5 mg after supper on Sun/Mon/Wed/Fri/Sat and 10 mg with the 5 mg to equal 15 mg after supper on Tues/Thurs) 90 tablet 1   warfarin (COUMADIN) 5 MG tablet Take 5-7.5 mg by mouth See admin instructions. Take 7.5 mg with the 10 mg to equal 17.5 mg after supper on Sun/Mon/Wed/Fri/Sat and 5 mg with the 10 mg to equal 15 mg after supper on Tues/Thurs     acetaminophen (TYLENOL) 500 MG tablet Take 1,000 mg by mouth every 6 (six) hours as needed for moderate pain.     gabapentin (NEURONTIN) 300 MG capsule Take 1 capsule by mouth daily.     methocarbamol (ROBAXIN) 500 MG tablet Take 1 tablet (500 mg total) by mouth 4 (four) times daily. 40 tablet 0   No facility-administered medications prior to visit.     Final Medications at End of Visit    Current Meds  Medication Sig   alfuzosin (UROXATRAL) 10 MG 24 hr tablet TAKE 1 TABLET BY MOUTH IN THE MORNING WITH BREAKFAST   Blood Glucose Monitoring Suppl (ONETOUCH VERIO) w/Device KIT by Does not apply route. Check blood sugar twice daily as directed DX E11.65   Cholecalciferol (VITAMIN D3) 50 MCG (2000 UT) TABS Take 2,000 Units by mouth daily.    lisinopril (PRINIVIL,ZESTRIL) 20 MG tablet Take 1 tablet (20 mg total) by mouth daily.   metFORMIN (GLUCOPHAGE) 500 MG tablet TAKE 1 TABLET BY MOUTH ONCE DAILY WITH BREAKFAST (Patient taking differently: Take 500 mg by mouth daily with breakfast.)   nystatin cream (MYCOSTATIN) Apply 1 application topically daily as needed (yeast).   ONETOUCH DELICA LANCETS 69S MISC USE 1  TO CHECK GLUCOSE TWICE DAILY (Patient taking differently: 2 (two) times daily.)   ONETOUCH VERIO test strip USE 1 STRIP TO CHECK GLUCOSE TWICE DAILY AS DIRECTED (Patient taking differently: 2 (two) times daily.)   senna-docusate (SENOKOT-S) 8.6-50 MG tablet Take 1 tablet by mouth at bedtime.   simvastatin (ZOCOR) 20 MG tablet TAKE 1 TABLET  BY MOUTH ONCE DAILY FOR CHOLESTEROL (Patient taking differently: Take 20 mg by mouth daily.)   traMADol (ULTRAM) 50 MG tablet Take one to two tablets by mouth every 6 hours as needed for pain. Do not take more than 6 tablets in 24 hours   warfarin (COUMADIN) 10 MG tablet TAKE 1 TABLET BY MOUTH ONCE DAILY ALONG  WITH  A  5  MG  TABLET  FOR  A  TOTAL  OF  15  MG  DAILY (Patient taking differently: Take 10 mg by mouth See admin instructions. Take 10 mg with the 7.5 mg to equal 17.5 mg after supper on Sun/Mon/Wed/Fri/Sat and 10 mg with the 5 mg to equal 15 mg after supper on Tues/Thurs)   warfarin (COUMADIN) 5 MG tablet Take 5-7.5 mg by mouth See admin instructions. Take 7.5 mg with the 10 mg to equal 17.5 mg after supper on Sun/Mon/Wed/Fri/Sat and 5 mg with the 10 mg to equal 15 mg after supper on Tues/Thurs    Radiology:   CT angiogram chest 02/13/2020: Cardiovascular: There is moderate to marked severity calcification of the aortic arch. Satisfactory opacification of the pulmonary arteries to the segmental level. Normal heart size. No pericardial effusion. Moderate severity coronary artery calcification is seen. No evidence of pulmonary embolism. Small hiatal hernia. Multilevel degenerative changes throughout the thoracic spine.  Cardiac Studies:   Lexiscan Tetrofosmin Stress Test 10/12/2020: Nondiagnostic ECG stress. Moderate degree medium extent fixed perfusion defect located in the apical inferior wall, mid inferior wall and basal inferior wall consistent with diaphragmatic attenuation. Minimal lateral ischemia cannot be excluded. Gated SPECT imaging of the left ventricle was normal. All segments of left ventricle demonstrated normal wall motion and thickening. Stress LV EF is normal 58%.  No previous exam available for comparison. Low risk.   Echocardiogram 10/11/2020: Normal LV systolic function with visual EF 55-60%. Left ventricle cavity is normal in size. Normal global wall motion.  Moderate left ventricular hypertrophy. Doppler evidence of grade II (pseudonormal) diastolic dysfunction, elevated LAP.  Left atrial cavity is moderately dilated. Aortic valve sclerosis without stenosis. Mild (Grade I) aortic regurgitation. Mild (Grade I) mitral regurgitation. Mild tricuspid regurgitation. No evidence of pulmonary hypertension. No prior study for comparison.  Carotid artery duplex 10/12/2020 Stenosis in the right internal carotid artery (1-15%) due to heterogeneous plaque. Stenosis in the right common carotid artery and bifurcation (<50%). Antegrade right vertebral artery flow.  Mild stenosis in the left proximal internal carotid artery (1-15%) due to heterogenous plaque. Mid to distal LICA not visualized (neck immobile).  Stenosis in the left common carotid artery (<50%). Stenosis in the left external carotid artery (<50%). Left vertebral artery not well visualized; however, by doppler predominantly antegrade flow. No prior studies for comparison. Follow up in one year is appropriate if clinically indicated.  EKG:  EKG 05/09/2021: Normal sinus rhythm at rate of 85 bpm, normal axis, right bundle branch block.  No evidence of ischemia.  No significant change from 11/09/2020.  Assessment     ICD-10-CM   1. Dyspnea on exertion  R06.00 EKG 12-Lead    2. Essential hypertension  I10     3. PAF (paroxysmal atrial fibrillation) (HCC)  I48.0     4. Primary hypercoagulable state ([place])  D68.59     5. Blood loss anemia  D50.0      CHA2DS2-VASc Score is 5.  Yearly risk of stroke: 7.2% (A, HTN, DM, Vasc Dz).  Score of 1=0.6; 2=2.2; 3=3.2; 4=4.8; 5=7.2; 6=9.8; 7=>9.8) -(CHF; HTN; vasc disease DM,  Male = 1; Age <65 =0; 65-74 = 1,  >75 =2; stroke/embolism= 2).     No orders of the defined types were placed in this encounter. Medications Discontinued During This Encounter  Medication Reason   acetaminophen (TYLENOL) 500 MG tablet Error   gabapentin (NEURONTIN) 300 MG  capsule Error   methocarbamol (ROBAXIN) 500 MG tablet Error   Orders Placed This  Encounter  Procedures   EKG 12-Lead      Recommendations:    MEHKI KLUMPP  is a 80 y.o. 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 He was evaluated by Dr. Ignacia Palma in July 2019, there was question about atrial fibrillation. There is no documented atrial fibrillation in chart.   Since his knee replacement on the left in May 2021, he has had low blood counts. 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  Iron studies have been normal.  Would consider repeating the studies if not already performed.    From cardiac standpoint his blood pressure is well controlled, lipids are at goal.  Dyspnea is related to his deconditioning and obesity.  No changes in the medications were done today.  We again discussed regarding weight loss.  I will see him back on a as needed basis as he is maintaining sinus rhythm and also his Coumadin and INR are being managed by his PCP for hypercoagulable state due to prior history of recurrent DVT.

## 2021-05-30 DIAGNOSIS — C61 Malignant neoplasm of prostate: Secondary | ICD-10-CM | POA: Diagnosis not present

## 2021-06-04 DIAGNOSIS — I10 Essential (primary) hypertension: Secondary | ICD-10-CM | POA: Diagnosis not present

## 2021-06-04 DIAGNOSIS — D692 Other nonthrombocytopenic purpura: Secondary | ICD-10-CM | POA: Diagnosis not present

## 2021-06-04 DIAGNOSIS — Z86010 Personal history of colonic polyps: Secondary | ICD-10-CM | POA: Diagnosis not present

## 2021-06-04 DIAGNOSIS — D6869 Other thrombophilia: Secondary | ICD-10-CM | POA: Diagnosis not present

## 2021-06-04 DIAGNOSIS — Z7901 Long term (current) use of anticoagulants: Secondary | ICD-10-CM | POA: Diagnosis not present

## 2021-06-04 DIAGNOSIS — G3184 Mild cognitive impairment, so stated: Secondary | ICD-10-CM | POA: Diagnosis not present

## 2021-06-04 DIAGNOSIS — M25512 Pain in left shoulder: Secondary | ICD-10-CM | POA: Diagnosis not present

## 2021-06-04 DIAGNOSIS — E1169 Type 2 diabetes mellitus with other specified complication: Secondary | ICD-10-CM | POA: Diagnosis not present

## 2021-06-04 DIAGNOSIS — Z6841 Body Mass Index (BMI) 40.0 and over, adult: Secondary | ICD-10-CM | POA: Diagnosis not present

## 2021-06-04 DIAGNOSIS — I480 Paroxysmal atrial fibrillation: Secondary | ICD-10-CM | POA: Diagnosis not present

## 2021-06-04 DIAGNOSIS — D649 Anemia, unspecified: Secondary | ICD-10-CM | POA: Diagnosis not present

## 2021-06-04 DIAGNOSIS — I77811 Abdominal aortic ectasia: Secondary | ICD-10-CM | POA: Diagnosis not present

## 2021-06-04 DIAGNOSIS — I714 Abdominal aortic aneurysm, without rupture: Secondary | ICD-10-CM | POA: Diagnosis not present

## 2021-06-11 DIAGNOSIS — D692 Other nonthrombocytopenic purpura: Secondary | ICD-10-CM | POA: Diagnosis not present

## 2021-06-11 DIAGNOSIS — Z85828 Personal history of other malignant neoplasm of skin: Secondary | ICD-10-CM | POA: Diagnosis not present

## 2021-06-11 DIAGNOSIS — L304 Erythema intertrigo: Secondary | ICD-10-CM | POA: Diagnosis not present

## 2021-06-11 DIAGNOSIS — L821 Other seborrheic keratosis: Secondary | ICD-10-CM | POA: Diagnosis not present

## 2021-06-18 DIAGNOSIS — I70203 Unspecified atherosclerosis of native arteries of extremities, bilateral legs: Secondary | ICD-10-CM | POA: Diagnosis not present

## 2021-06-18 DIAGNOSIS — L603 Nail dystrophy: Secondary | ICD-10-CM | POA: Diagnosis not present

## 2021-06-18 DIAGNOSIS — R609 Edema, unspecified: Secondary | ICD-10-CM | POA: Diagnosis not present

## 2021-06-18 DIAGNOSIS — E1151 Type 2 diabetes mellitus with diabetic peripheral angiopathy without gangrene: Secondary | ICD-10-CM | POA: Diagnosis not present

## 2021-06-18 DIAGNOSIS — I739 Peripheral vascular disease, unspecified: Secondary | ICD-10-CM | POA: Diagnosis not present

## 2021-06-18 DIAGNOSIS — L84 Corns and callosities: Secondary | ICD-10-CM | POA: Diagnosis not present

## 2021-06-18 DIAGNOSIS — B351 Tinea unguium: Secondary | ICD-10-CM | POA: Diagnosis not present

## 2021-07-04 DIAGNOSIS — I480 Paroxysmal atrial fibrillation: Secondary | ICD-10-CM | POA: Diagnosis not present

## 2021-07-04 DIAGNOSIS — Z86718 Personal history of other venous thrombosis and embolism: Secondary | ICD-10-CM | POA: Diagnosis not present

## 2021-07-04 DIAGNOSIS — Z7901 Long term (current) use of anticoagulants: Secondary | ICD-10-CM | POA: Diagnosis not present

## 2021-07-04 DIAGNOSIS — D6869 Other thrombophilia: Secondary | ICD-10-CM | POA: Diagnosis not present

## 2021-07-09 DIAGNOSIS — L603 Nail dystrophy: Secondary | ICD-10-CM | POA: Diagnosis not present

## 2021-07-09 DIAGNOSIS — E1151 Type 2 diabetes mellitus with diabetic peripheral angiopathy without gangrene: Secondary | ICD-10-CM | POA: Diagnosis not present

## 2021-07-09 DIAGNOSIS — M79674 Pain in right toe(s): Secondary | ICD-10-CM | POA: Diagnosis not present

## 2021-07-09 DIAGNOSIS — L84 Corns and callosities: Secondary | ICD-10-CM | POA: Diagnosis not present

## 2021-07-09 DIAGNOSIS — B351 Tinea unguium: Secondary | ICD-10-CM | POA: Diagnosis not present

## 2021-07-09 DIAGNOSIS — M79675 Pain in left toe(s): Secondary | ICD-10-CM | POA: Diagnosis not present

## 2021-07-11 DIAGNOSIS — I739 Peripheral vascular disease, unspecified: Secondary | ICD-10-CM | POA: Diagnosis not present

## 2021-07-29 ENCOUNTER — Other Ambulatory Visit: Payer: Self-pay

## 2021-07-29 ENCOUNTER — Ambulatory Visit: Payer: PPO | Admitting: Orthopaedic Surgery

## 2021-07-29 ENCOUNTER — Ambulatory Visit (INDEPENDENT_AMBULATORY_CARE_PROVIDER_SITE_OTHER): Payer: PPO

## 2021-07-29 VITALS — Ht 64.0 in | Wt 250.0 lb

## 2021-07-29 DIAGNOSIS — M25552 Pain in left hip: Secondary | ICD-10-CM

## 2021-07-29 DIAGNOSIS — M1612 Unilateral primary osteoarthritis, left hip: Secondary | ICD-10-CM | POA: Diagnosis not present

## 2021-07-29 NOTE — Progress Notes (Signed)
Office Visit Note   Patient: Colton Miranda           Date of Birth: 08-Oct-1940           MRN: [id number] Visit Date: 07/29/2021              Requested by: Ginger Organ., MD [address] [place],  [place] [address] PCP: Ginger Organ., MD   Assessment & Plan: Visit Diagnoses:  1. Pain in left hip   2. Unilateral primary osteoarthritis, left hip   3. Severe obesity (BMI >= 40) (HCC)     Plan: I did go over his x-rays with him.  He does have significant arthritis in his left hip.  I would like to set him up for a left hip intra-articular steroid injection with Dr. Ernestina Patches for sometime in the near future.  He is requested this as well since he had 1 of those in his right hip while he awaited surgery.  I did give him a handout about anterior hip replacement surgery.  My biggest concern is his weight.  He may now be part of an accountable care organization that is part of the bundle payment system is only allows a total joint replacement when someone's BMI is below 40.  We will have to look into that as well.  Regardless, I have still recommended weight loss and I would like to see him back in about 8 weeks with a repeat weight and BMI calculation.  He will have had an injection in his left hip joint prior to that and this will hopefully temporize his pain over the holidays.  All questions and concerns were answered addressed.  Follow-Up Instructions: Return in about 2 months (around 09/28/2021).   Orders:  Orders Placed This Encounter  Procedures   XR HIP UNILAT W OR W/O PELVIS 1V LEFT   No orders of the defined types were placed in this encounter.     Procedures: No procedures performed   Clinical Data: No additional findings.   Subjective: Chief Complaint  Patient presents with   Left Hip - Pain  The patient is a very pleasant 80 year old gentleman sent by Dr. [place]on Redwood of [facility] to evaluate and treat left hip pain.  He actually used to  be a patient of [facility].  He has had a right hip replaced by Dr. Maureen Ralphs and a left knee replaced by Dr. Theda Sers.  He is a diabetic but reports a good hemoglobin A1c.  His left hip pain is daily and he walks with a walker.  It is detrimentally affecting his mobility, his quality of life and his actives daily living.  He is obese with a BMI of 42.91.  HPI  Review of Systems There is currently listed no headache, chest pain, shortness of breath, fever, chills, nausea, vomiting  Objective: Vital Signs: Ht 5\' 4"  (1.626 m)   Wt 250 lb (113.4 kg)   BMI 42.91 kg/m   Physical Exam  Ortho Exam His left hip is significantly stiff on internal and external rotation with pain in the groin with attempts of rotation.  There is also lateral hip pain on the left hip.  His right hip which is a total hip arthroplasty moves smoothly. Specialty Comments:  No specialty comments available.  Imaging: XR HIP UNILAT W OR W/O PELVIS 1V LEFT  Result Date: 07/29/2021 An AP pelvis and lateral of the left hip shows severe end-stage arthritis of the left  hip with complete loss of joint space.  There is flattening of the femoral head and cystic change in the femoral head and acetabulum.  There is a right total hip arthroplasty that appears well-seated.    PMFS History: Patient Active Problem List   Diagnosis Date Noted   Unilateral primary osteoarthritis, left hip 07/29/2021   Anemia 02/14/2020   Hyponatremia 02/14/2020   S/P total knee arthroplasty, left 02/14/2020   Osteoarthritis of left knee 02/10/2020   Contact dermatitis 01/19/2020   PAF (paroxysmal atrial fibrillation) ([place]) 04/14/2018   Malignant neoplasm of prostate ([place]) 10/07/2017   Colon polyps 11/26/2016   Influenza vaccination declined 05/28/2016   History of fall 07/25/2015   Palpitations 11/08/2014   Pain of left arm 11/08/2014   Pain in joint, lower leg 02/28/2014   Essential hypertension 02/28/2014   Disturbance of skin  sensation 02/23/2013   Insomnia, unspecified 02/23/2013   Obesity    DM (diabetes mellitus), secondary, uncontrolled, with peripheral vascular complications    Hyperlipemia    Urinary frequency    Primary hypercoagulable state ([place]) 01/03/2013   Other pulmonary embolism and infarction 01/03/2013   Long term current use of anticoagulant therapy 01/03/2013   Past Medical History:  Diagnosis Date   BPH associated with nocturia    Chronic constipation    COPD (chronic obstructive pulmonary disease) (HCC)    Eczema    Heart murmur    at birth   History of adenomatous polyp of colon    History of basal cell carcinoma (BCC) of skin    post excision from trunk   History of CVA (cerebrovascular accident) without residual deficits    11-06-2017 per pt and wife "stroke was approx. 20 years ago , 1999, caused by blood clot after knee scope"  DVT to PE   History of depression    over 40 years ago nervous breakdown   History of DVT (deep vein thrombosis)    11-06-2017 per pt and wife happened lower leg approx. 1999 after knee scope   History of pulmonary embolus (PE)    11-06-2017 from DVT in approx. 1999 per wife and pt   HOH (hard of hearing)    Hyperlipidemia    Hypertension    followed by pcp   Long term (current) use of anticoagulants Coumadin-- followed by Pharmasist w/ [facility])   secondary to primary hypercoagulopathy w/ hx DVT and PE   Myalgia and myositis, unspecified    OA (osteoarthritis)    "all joints"   PAF (paroxysmal atrial fibrillation) ([place]) 2019   Peripheral neuropathy    Pinched nerve in neck    per pt causes intermittant numbness bilateral arms and hands   Primary hypercoagulable state ([place])    Prostate cancer [facility]) urologist-  dr Alyson Ingles  oncologist-- dr Tammi Klippel   dx 08-28-2017  Stage T1c, Gleason 4+4, PSA 14.1, vol 42.8cc-- planned treatment external radiation and ADT   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[place])    Protein S deficiency ([place])    Right bundle  branch block    Seborrheic dermatitis, unspecified    Stroke ([place])    Type 2 diabetes mellitus ([place])    Urinary frequency    Wears glasses     Family History  Problem Relation Age of Onset   Cancer Mother 58       colon , breast   Colon cancer Mother    Parkinson's disease Sister    Cancer Sister  breast   Cancer Maternal Uncle        melanoma   Cancer Paternal Uncle    Heart attack Daughter        2008    Past Surgical History:  Procedure Laterality Date   [place]  last one 01-28-2017   GOLD SEED IMPLANT N/A 11/16/2017   Procedure: GOLD SEED IMPLANT;  Surgeon: Cleon Gustin, MD;  [facility];  Service: Urology;  Laterality: N/A;   KNEE ARTHROSCOPY Right 1990s   PILONIDAL CYST EXCISION  1968   PROSTATE BIOPSY  09/10/2017    (done in office)   + Pos for Prostate Cancer, Dr.MCkinzie( [facility])    SPACE OAR INSTILLATION N/A 11/16/2017   Procedure: SPACE OAR INSTILLATION;  Surgeon: Cleon Gustin, MD;  Location[facility];  Service: Urology;  Laterality: N/A;   TOTAL HIP ARTHROPLASTY Right 06-18-2009   dr Wynelle Link  [facility]   TOTAL KNEE ARTHROPLASTY Left 02/10/2020   Procedure: TOTAL KNEE ARTHROPLASTY;  Surgeon: Sydnee Cabal, MD;  Location: [facility];  Service: Orthopedics;  Laterality: Left;  adductor canale   Social History   Occupational History   Not on file  Tobacco Use   Smoking status: Former    Packs/day: 0.50    Years: 50.00    Pack years: 25.00    Types: Cigarettes    Quit date: 01/03/2009    Years since quitting: 12.5   Smokeless tobacco: Never  Vaping Use   Vaping Use: Never used  Substance and Sexual Activity   Alcohol use: Yes    Alcohol/week: 3.0 standard drinks    Types: 3 Cans of beer per week    Comment: 1-2 beers a week   Drug use: No   Sexual activity: Not Currently    Partners: Female

## 2021-08-06 ENCOUNTER — Encounter: Payer: Self-pay | Admitting: Physical Medicine and Rehabilitation

## 2021-08-06 ENCOUNTER — Other Ambulatory Visit: Payer: Self-pay

## 2021-08-06 ENCOUNTER — Ambulatory Visit: Payer: Self-pay

## 2021-08-06 ENCOUNTER — Ambulatory Visit (INDEPENDENT_AMBULATORY_CARE_PROVIDER_SITE_OTHER): Payer: PPO | Admitting: Physical Medicine and Rehabilitation

## 2021-08-06 DIAGNOSIS — M25552 Pain in left hip: Secondary | ICD-10-CM | POA: Diagnosis not present

## 2021-08-06 MED ORDER — BUPIVACAINE HCL 0.25 % IJ SOLN
4.0000 mL | INTRAMUSCULAR | Status: AC | PRN
  Administered 2021-08-06: 4 mL via INTRA_ARTICULAR

## 2021-08-06 MED ORDER — TRIAMCINOLONE ACETONIDE 40 MG/ML IJ SUSP
60.0000 mg | INTRAMUSCULAR | Status: AC | PRN
  Administered 2021-08-06: 60 mg via INTRA_ARTICULAR

## 2021-08-06 NOTE — Progress Notes (Signed)
   AKHIL PISCOPO - 80 y.o. male MRN [id number]  Date of birth: [birth year]  Office Visit Note: Visit Date: 08/06/2021 PCP: Ginger Organ., MD Referred by: Ginger Organ., MD  Subjective: Chief Complaint  Patient presents with   Left Hip - Pain   HPI:  CHRISTIFER CHAPDELAINE is a 81 y.o. male who comes in today at the request of Dr. Jean Rosenthal for planned Left anesthetic hip arthrogram with fluoroscopic guidance.  The patient has failed conservative care including home exercise, medications, time and activity modification.  This injection will be diagnostic and hopefully therapeutic.  Please see requesting physician notes for further details and justification.   ROS Otherwise per HPI.  Assessment & Plan: Visit Diagnoses:    ICD-10-CM   1. Pain in left hip  M25.552       Plan: No additional findings.   Meds & Orders: No orders of the defined types were placed in this encounter.   Orders Placed This Encounter  Procedures   Large Joint Inj    Follow-up: Return for Jean Rosenthal, MD as scheduled.   Procedures: Large Joint Inj: L hip joint on 08/06/2021 8:19 AM Indications: diagnostic evaluation and pain Details: 22 G 3.5 in needle, fluoroscopy-guided anterior approach  Arthrogram: No  Medications: 4 mL bupivacaine 0.25 %; 60 mg triamcinolone acetonide 40 MG/ML Outcome: tolerated well, no immediate complications  There was excellent flow of contrast producing a partial arthrogram of the hip. The patient did have relief of symptoms during the anesthetic phase of the injection. Procedure, treatment alternatives, risks and benefits explained, specific risks discussed. Consent was given by the patient. Immediately prior to procedure a time out was called to verify the correct patient, procedure, equipment, support staff and site/side marked as required. Patient was prepped and draped in the usual sterile fashion.         Clinical History: No specialty  comments available.     Objective:  VS:  HT:    WT:   BMI:     BP:   HR: bpm  TEMP: ( )  RESP:  Physical Exam   Imaging: No results found.

## 2021-08-06 NOTE — Progress Notes (Signed)
Pt state left hip pain that travels to his buttocks. Pt state walking, standing and laying down makes the pain worse. Pt state he takes pain meds to help ease his pain.  Numeric Pain Rating Scale and Functional Assessment Average Pain 7   In the last MONTH (on 0-10 scale) has pain interfered with the following?  1. General activity like being  able to carry out your everyday physical activities such as walking, climbing stairs, carrying groceries, or moving a chair?  Rating(10)   +Driver, +BT, -Dye Allergies.

## 2021-08-08 ENCOUNTER — Other Ambulatory Visit: Payer: Self-pay | Admitting: Urology

## 2021-08-08 DIAGNOSIS — D6869 Other thrombophilia: Secondary | ICD-10-CM | POA: Diagnosis not present

## 2021-08-08 DIAGNOSIS — I480 Paroxysmal atrial fibrillation: Secondary | ICD-10-CM | POA: Diagnosis not present

## 2021-08-08 DIAGNOSIS — Z7901 Long term (current) use of anticoagulants: Secondary | ICD-10-CM | POA: Diagnosis not present

## 2021-08-08 DIAGNOSIS — Z86718 Personal history of other venous thrombosis and embolism: Secondary | ICD-10-CM | POA: Diagnosis not present

## 2021-09-04 DIAGNOSIS — Z7901 Long term (current) use of anticoagulants: Secondary | ICD-10-CM | POA: Diagnosis not present

## 2021-09-04 DIAGNOSIS — D6869 Other thrombophilia: Secondary | ICD-10-CM | POA: Diagnosis not present

## 2021-09-04 DIAGNOSIS — Z86718 Personal history of other venous thrombosis and embolism: Secondary | ICD-10-CM | POA: Diagnosis not present

## 2021-09-04 DIAGNOSIS — I480 Paroxysmal atrial fibrillation: Secondary | ICD-10-CM | POA: Diagnosis not present

## 2021-09-04 DIAGNOSIS — E1169 Type 2 diabetes mellitus with other specified complication: Secondary | ICD-10-CM | POA: Diagnosis not present

## 2021-09-10 DIAGNOSIS — L84 Corns and callosities: Secondary | ICD-10-CM | POA: Diagnosis not present

## 2021-09-10 DIAGNOSIS — L602 Onychogryphosis: Secondary | ICD-10-CM | POA: Diagnosis not present

## 2021-09-10 DIAGNOSIS — E1151 Type 2 diabetes mellitus with diabetic peripheral angiopathy without gangrene: Secondary | ICD-10-CM | POA: Diagnosis not present

## 2021-09-30 ENCOUNTER — Encounter: Payer: Self-pay | Admitting: Orthopaedic Surgery

## 2021-09-30 ENCOUNTER — Ambulatory Visit: Payer: PPO | Admitting: Orthopaedic Surgery

## 2021-09-30 VITALS — Ht 64.0 in | Wt 225.0 lb

## 2021-09-30 DIAGNOSIS — M1612 Unilateral primary osteoarthritis, left hip: Secondary | ICD-10-CM

## 2021-09-30 DIAGNOSIS — M25552 Pain in left hip: Secondary | ICD-10-CM | POA: Diagnosis not present

## 2021-09-30 NOTE — Progress Notes (Signed)
The patient comes in today for continued follow-up as it relates to his severe end-stage arthritis of his left hip.  When I saw him at his first visit his BMI was 43.  It is now down to 38.  He is worked on weight loss significantly.  He is 81 years old and does have severe daily left hip pain.  An intra-articular injection in November in the left hip did help him for a while but his pain is back and severe.  He does ambulate with a rolling walker.  He is on Coumadin and when we schedule surgery will need to be bridged with Lovenox.  His left hip has significant pain and stiffness with internal and external rotation.  His previous x-rays show severe end-stage arthritis of the left hip.  At this point the left hip pain is detrimentally affecting his mobility, his quality of life and his actives daily living to the point he does wish to proceed with a hip replacement.  At his last visit I went over hip replacement surgery and showed him a hip replacement model and described the surgery in detail.  Again today we talked about the risks and benefits of surgery and what to expect with his intraoperative and postoperative course.  I am very pleased that his weight loss and he will continue to lose weight as we work on getting him scheduled for surgery in the near future.  Once we have a surgery date will need to be in touch with him so we can see about bridging him with Lovenox after the Coumadin is stopped.  We will have Coumadin stopped about 6 days prior to surgery.  All questions and concerns were answered and addressed.

## 2021-10-08 DIAGNOSIS — Z7901 Long term (current) use of anticoagulants: Secondary | ICD-10-CM | POA: Diagnosis not present

## 2021-10-08 DIAGNOSIS — D6869 Other thrombophilia: Secondary | ICD-10-CM | POA: Diagnosis not present

## 2021-10-08 DIAGNOSIS — Z86718 Personal history of other venous thrombosis and embolism: Secondary | ICD-10-CM | POA: Diagnosis not present

## 2021-10-08 DIAGNOSIS — E1169 Type 2 diabetes mellitus with other specified complication: Secondary | ICD-10-CM | POA: Diagnosis not present

## 2021-10-08 DIAGNOSIS — I480 Paroxysmal atrial fibrillation: Secondary | ICD-10-CM | POA: Diagnosis not present

## 2021-10-16 ENCOUNTER — Other Ambulatory Visit: Payer: Self-pay

## 2021-10-21 ENCOUNTER — Other Ambulatory Visit: Payer: Self-pay | Admitting: Physician Assistant

## 2021-10-21 DIAGNOSIS — M1612 Unilateral primary osteoarthritis, left hip: Secondary | ICD-10-CM

## 2021-10-21 NOTE — Progress Notes (Signed)
Surgical Instructions    Your procedure is scheduled on Thursday February 9th 7am.  Report to [facility] Main Entrance "A" at 7 A.M., then check in with the Admitting office.  Call this number if you have problems the morning of surgery:  [phone]   If you have any questions prior to your surgery date call [phone]: Open Monday-Friday 8am-4pm    Remember:  Do not eat after midnight the night before your surgery  You may drink clear liquids until 6am the morning of your surgery.   Clear liquids allowed are: Water, Non-Citrus Juices (without pulp), Carbonated Beverages, Clear Tea, Black Coffee ONLY (NO MILK, CREAM OR POWDERED CREAMER of any kind), and Gatorade    Take these medicines the morning of surgery with A SIP OF WATER: simvastatin (ZOCOR) 20 MG tablet   IF NEEDED  traMADol (ULTRAM) 50 MG tablet    Follow your surgeon's instructions on when to stop Coumadin.  If no instructions were given by your surgeon then you will need to call the office to get those instructions.     As of today, STOP taking any Aspirin (unless otherwise instructed by your surgeon) Aleve, Naproxen, Ibuprofen, Motrin, Advil, Goody's, BC's, all herbal medications, fish oil, and all vitamins.  WHAT DO I DO ABOUT MY DIABETES MEDICATION?   Do not take oral diabetes medicines (Metformin) the morning of surgery.   HOW TO MANAGE YOUR DIABETES BEFORE AND AFTER SURGERY  Why is it important to control my blood sugar before and after surgery? Improving blood sugar levels before and after surgery helps healing and can limit problems. A way of improving blood sugar control is eating a healthy diet by:  Eating less sugar and carbohydrates  Increasing activity/exercise  Talking with your doctor about reaching your blood sugar goals High blood sugars (greater than 180 mg/dL) can raise your risk of infections and slow your recovery, so you will need to focus on controlling your diabetes during the weeks  before surgery. Make sure that the doctor who takes care of your diabetes knows about your planned surgery including the date and location.  How do I manage my blood sugar before surgery? Check your blood sugar at least 4 times a day, starting 2 days before surgery, to make sure that the level is not too high or low.  Check your blood sugar the morning of your surgery when you wake up and every 2 hours until you get to the Short Stay unit.  If your blood sugar is less than 70 mg/dL, you will need to treat for low blood sugar: Do not take insulin. Treat a low blood sugar (less than 70 mg/dL) with  cup of clear juice (cranberry or apple), 4 glucose tablets, OR glucose gel. Recheck blood sugar in 15 minutes after treatment (to make sure it is greater than 70 mg/dL). If your blood sugar is not greater than 70 mg/dL on recheck, call [phone] for further instructions. Report your blood sugar to the short stay nurse when you get to Short Stay.  If you are admitted to the hospital after surgery: Your blood sugar will be checked by the staff and you will probably be given insulin after surgery (instead of oral diabetes medicines) to make sure you have good blood sugar levels. The goal for blood sugar control after surgery is 80-180 mg/dL.  After your COVID test   You are not required to quarantine however you are required to wear a well-fitting mask when you are  out and around people not in your household.  If your mask becomes wet or soiled, replace with a new one.  Wash your hands often with soap and water for 20 seconds or clean your hands with an alcohol-based hand sanitizer that contains at least 60% alcohol.  Do not share personal items.  Notify your provider: if you are in close contact with someone who has COVID  or if you develop a fever of 100.4 or greater, sneezing, cough, sore throat, shortness of breath or body aches.           Do not wear jewelry  Do not wear lotions,  powders, colognes, or deodorant. Do not shave 48 hours prior to surgery.  Men may shave face and neck. Do not bring valuables to the hospital. Do not wear nail polish, gel polish, artificial nails, or any other type of covering on natural nails (fingers and toes) If you have artificial nails or gel coating that need to be removed by a nail salon, please have this removed prior to surgery. Artificial nails or gel coating may interfere with anesthesia's ability to adequately monitor your vital signs.             [facility] is not responsible for any belongings or valuables.  Do NOT Smoke (Tobacco/Vaping)  24 hours prior to your procedure  If you use a CPAP at night, you may bring your mask for your overnight stay.   Contacts, glasses, hearing aids, dentures or partials may not be worn into surgery, please bring cases for these belongings   For patients admitted to the hospital, discharge time will be determined by your treatment team.   Patients discharged the day of surgery will not be allowed to drive home, and someone needs to stay with them for 24 hours.  NO VISITORS WILL BE ALLOWED IN PRE-OP WHERE PATIENTS ARE PREPPED FOR SURGERY.  ONLY 1 SUPPORT PERSON MAY BE PRESENT IN THE WAITING ROOM WHILE YOU ARE IN SURGERY.  IF YOU ARE TO BE ADMITTED, ONCE YOU ARE IN YOUR ROOM YOU WILL BE ALLOWED TWO (2) VISITORS. 1 (ONE) VISITOR MAY STAY OVERNIGHT BUT MUST ARRIVE TO THE ROOM BY 8pm.  Minor children may have two parents present. Special consideration for safety and communication needs will be reviewed on a case by case basis.  Special instructions:    Oral Hygiene is also important to reduce your risk of infection.  Remember - BRUSH YOUR TEETH THE MORNING OF SURGERY WITH YOUR REGULAR TOOTHPASTE   [facility]- Preparing For Surgery  Before surgery, you can play an important role. Because skin is not sterile, your skin needs to be as free of germs as possible. You can reduce the number of germs on  your skin by washing with CHG (chlorahexidine gluconate) Soap before surgery.  CHG is an antiseptic cleaner which kills germs and bonds with the skin to continue killing germs even after washing.     Please do not use if you have an allergy to CHG or antibacterial soaps. If your skin becomes reddened/irritated stop using the CHG.  Do not shave (including legs and underarms) for at least 48 hours prior to first CHG shower. It is OK to shave your face.  Please follow these instructions carefully.     Shower the NIGHT BEFORE SURGERY and the MORNING OF SURGERY with CHG Soap.   If you chose to wash your hair, wash your hair first as usual with your normal shampoo. After you  shampoo, rinse your hair and body thoroughly to remove the shampoo.  Then [employer] and genitals (private parts) with your normal soap and rinse thoroughly to remove soap.  After that Use CHG Soap as you would any other liquid soap. You can apply CHG directly to the skin and wash gently with a scrungie or a clean washcloth.   Apply the CHG Soap to your body ONLY FROM THE NECK DOWN.  Do not use on open wounds or open sores. Avoid contact with your eyes, ears, mouth and genitals (private parts). Wash Face and genitals (private parts)  with your normal soap.   Wash thoroughly, paying special attention to the area where your surgery will be performed.  Thoroughly rinse your body with warm water from the neck down.  DO NOT shower/wash with your normal soap after using and rinsing off the CHG Soap.  Pat yourself dry with a CLEAN TOWEL.  Wear CLEAN PAJAMAS to bed the night before surgery  Place CLEAN SHEETS on your bed the night before your surgery  DO NOT SLEEP WITH PETS.   Day of Surgery:  Take a shower with CHG soap. Wear Clean/Comfortable clothing the morning of surgery Do not apply any deodorants/lotions.   Remember to brush your teeth WITH YOUR REGULAR TOOTHPASTE.   Please read over the following fact sheets that  you were given.

## 2021-10-22 ENCOUNTER — Other Ambulatory Visit: Payer: Self-pay

## 2021-10-22 ENCOUNTER — Encounter (HOSPITAL_COMMUNITY)
Admission: RE | Admit: 2021-10-22 | Discharge: 2021-10-22 | Disposition: A | Discharge status: Home / Self Care | Payer: PPO | Source: Ambulatory Visit | Attending: Orthopaedic Surgery | Admitting: Orthopaedic Surgery

## 2021-10-22 ENCOUNTER — Encounter (HOSPITAL_COMMUNITY): Payer: Self-pay

## 2021-10-22 VITALS — HR 80 | Temp 97.9°F | Resp 18 | Ht 66.0 in | Wt 228.0 lb

## 2021-10-22 DIAGNOSIS — Z993 Dependence on wheelchair: Secondary | ICD-10-CM | POA: Insufficient documentation

## 2021-10-22 DIAGNOSIS — M25512 Pain in left shoulder: Secondary | ICD-10-CM | POA: Diagnosis not present

## 2021-10-22 DIAGNOSIS — Z7901 Long term (current) use of anticoagulants: Secondary | ICD-10-CM | POA: Insufficient documentation

## 2021-10-22 DIAGNOSIS — Z01812 Encounter for preprocedural laboratory examination: Secondary | ICD-10-CM | POA: Insufficient documentation

## 2021-10-22 DIAGNOSIS — I959 Hypotension, unspecified: Secondary | ICD-10-CM | POA: Insufficient documentation

## 2021-10-22 DIAGNOSIS — Z01818 Encounter for other preprocedural examination: Secondary | ICD-10-CM

## 2021-10-22 DIAGNOSIS — Z79899 Other long term (current) drug therapy: Secondary | ICD-10-CM | POA: Insufficient documentation

## 2021-10-22 DIAGNOSIS — M169 Osteoarthritis of hip, unspecified: Secondary | ICD-10-CM | POA: Diagnosis not present

## 2021-10-22 DIAGNOSIS — G8929 Other chronic pain: Secondary | ICD-10-CM | POA: Diagnosis not present

## 2021-10-22 DIAGNOSIS — Z96659 Presence of unspecified artificial knee joint: Secondary | ICD-10-CM | POA: Diagnosis not present

## 2021-10-22 DIAGNOSIS — M1612 Unilateral primary osteoarthritis, left hip: Secondary | ICD-10-CM | POA: Diagnosis not present

## 2021-10-22 DIAGNOSIS — I10 Essential (primary) hypertension: Secondary | ICD-10-CM

## 2021-10-22 DIAGNOSIS — D6859 Other primary thrombophilia: Secondary | ICD-10-CM | POA: Insufficient documentation

## 2021-10-22 HISTORY — DX: Depression, unspecified: F32.A

## 2021-10-22 LAB — SURGICAL PCR SCREEN
MRSA, PCR: NEGATIVE
Staphylococcus aureus: NEGATIVE

## 2021-10-22 LAB — GLUCOSE, CAPILLARY: Glucose-Capillary: 128 mg/dL — ABNORMAL HIGH (ref 70–99)

## 2021-10-22 LAB — BASIC METABOLIC PANEL
Anion gap: 8 (ref 5–15)
BUN: 21 mg/dL (ref 8–23)
CO2: 24 mmol/L (ref 22–32)
Calcium: 9.3 mg/dL (ref 8.9–10.3)
Chloride: 102 mmol/L (ref 98–111)
Creatinine, Ser: 1.4 mg/dL — ABNORMAL HIGH (ref 0.61–1.24)
GFR, Estimated: 51 mL/min — ABNORMAL LOW (ref >60)
Glucose, Bld: 127 mg/dL — ABNORMAL HIGH (ref 70–99)
Potassium: 4.3 mmol/L (ref 3.5–5.1)
Sodium: 134 mmol/L — ABNORMAL LOW (ref 135–145)

## 2021-10-22 LAB — TYPE AND SCREEN
ABO/RH(D): O POS
Antibody Screen: NEGATIVE

## 2021-10-22 LAB — CBC
HCT: 35.3 % — ABNORMAL LOW (ref 39.0–52.0)
Hemoglobin: 11.4 g/dL — ABNORMAL LOW (ref 13.0–17.0)
MCH: 27.7 pg (ref 26.0–34.0)
MCHC: 32.3 g/dL (ref 30.0–36.0)
MCV: 85.7 fL (ref 80.0–100.0)
Platelets: 292 10*3/uL (ref 150–400)
RBC: 4.12 MIL/uL — ABNORMAL LOW (ref 4.22–5.81)
RDW: 14 % (ref 11.5–15.5)
WBC: 8.8 10*3/uL (ref 4.0–10.5)
nRBC: 0 % (ref 0.0–0.2)

## 2021-10-22 LAB — HEMOGLOBIN A1C
Hgb A1c MFr Bld: 6.5 % — ABNORMAL HIGH (ref 4.8–5.6)
Mean Plasma Glucose: 139.85 mg/dL

## 2021-10-22 NOTE — Progress Notes (Signed)
Anesthesia Chart Review:  Evaluated patient at preop testing appointment due to low blood pressure.  Multiple readings were taken by myself and other RNs and techs, all in the 80s/40s.  Patient states he does not recall previously having readings that low.  He denies any symptoms, denies lightheadedness, dizziness, presyncope, syncope.  He denies any chest pain or palpitations.  He states he has had a recent cardiovascular evaluation by Dr. Einar Gip with normal stress test was told to follow-up on an as-needed basis.  He does take lisinopril 20 mg daily and states he took it this morning as usual.  He does report to some possible increased fatigue, however he has a very low baseline functional status secondary to multiple orthopedic issues.  He is currently in a wheelchair and does very little ambulation at home with the assistance of a walker.  I asked him if he had history of anemia, which she denied, however review of labs available in epic shows prior hgb in the 7-8 range in 2021 (notably, these were immediately s/p TKA).  He is on chronic Coumadin secondary to hypercoagulable state.  He denies any bleeding.  On exam he is in pain secondary to severe hip arthritis as well as chronic left shoulder pain.  He is alert and oriented x3.  Heart is regular rate and rhythm, heart sounds are distant.  Given that he is asymptomatic, I do not feel he needs acute evaluation for his hypotension.  He does have a home blood pressure cuff.  He is advised to check his blood pressure when gets home, and again tomorrow morning and afternoon.  I will call the patient to follow-up on the readings he gets at home.  If it remains as low as it was here, I will reach out to his PCP Dr. Marton Redwood for assistance with optimization of patient for surgery.  He also discussed that if he were develop any presyncopal symptoms he should be evaluated urgently.  Last seen by Dr. Brigitte Pulse 10/08/21 for followup. BP at that time recorded as 128/70.   Per care everywhere, he was given Lovenox prescription at that time.   I called the patient on 10/23/2021 to follow-up on blood pressure.  I spoke with the patient's wife who had been using their automatic cuff to check the patient's pressure.  She reports she took 3 readings over the past 24 hours ranging from 114/64 at the lowest to 164/80 at the highest.  The patient has had no symptoms of dizziness, presyncope, syncope.  We discussed that the patient may have been a little dehydrated as his creatinine was mildly bumped on preop labs to 1.40.  She advised that the patient had not eaten lunch prior to his appointment and had not had his normal fluid intake either.  She was encouraged to make sure the patient staying adequately hydrated and continue to monitor his blood pressure leading up to surgery.  She understands that if the patient were to have any consistently low readings or develop any new symptoms she will contact their primary care provider for input.    Given that home readings are reasonable and patient is without symptoms, anticipate he can proceed with surgery as planned barring acute status change.  Preop labs reviewed, creatinine mildly elevated 1.40, hemoglobin mildly low 11.4, sodium mildly low at 134, otherwise unremarkable.  DM2 well controlled, A1c 6.5.  EKG 05/09/2021:  Normal sinus rhythm at rate of 85 bpm, normal axis, right bundle branch block.  No  evidence of ischemia.  No significant change from 11/09/2020.  Lexiscan Tetrofosmin Stress Test 10/12/2020: Nondiagnostic ECG stress. Moderate degree medium extent fixed perfusion defect located in the apical inferior wall, mid inferior wall and basal inferior wall consistent with diaphragmatic attenuation. Minimal lateral ischemia cannot be excluded. Gated SPECT imaging of the left ventricle was normal. All segments of left ventricle demonstrated normal wall motion and thickening. Stress LV EF is normal 58%.  No previous exam  available for comparison. Low risk.    Echocardiogram 10/11/2020: Normal LV systolic function with visual EF 55-60%. Left ventricle cavity is normal in size. Normal global wall motion. Moderate left ventricular hypertrophy. Doppler evidence of grade II (pseudonormal) diastolic dysfunction, elevated LAP.  Left atrial cavity is moderately dilated. Aortic valve sclerosis without stenosis. Mild (Grade I) aortic regurgitation. Mild (Grade I) mitral regurgitation. Mild tricuspid regurgitation. No evidence of pulmonary hypertension. No prior study for comparison.   Carotid artery duplex 10/12/2020 Stenosis in the right internal carotid artery (1-15%) due to heterogeneous plaque. Stenosis in the right common carotid artery and bifurcation (<50%). Antegrade right vertebral artery flow.  Mild stenosis in the left proximal internal carotid artery (1-15%) due to heterogenous plaque. Mid to distal LICA not visualized (neck immobile).  Stenosis in the left common carotid artery (<50%). Stenosis in the left external carotid artery (<50%). Left vertebral artery not well visualized; however, by doppler predominantly antegrade flow. No prior studies for comparison. Follow up in one year is appropriate if clinically indicated.    Wynonia Musty [facility]/Anesthesiology Phone [phone] 10/23/2021 3:37 PM

## 2021-10-22 NOTE — Progress Notes (Signed)
°   10/22/21 1505  OBSTRUCTIVE SLEEP APNEA  Have you ever been diagnosed with sleep apnea through a sleep study? No  Do you snore loudly (loud enough to be heard through closed doors)?  0  Do you often feel tired, fatigued, or sleepy during the daytime (such as falling asleep during driving or talking to someone)? 0  Has anyone observed you stop breathing during your sleep? 0  Do you have, or are you being treated for high blood pressure? 1  BMI more than 35 kg/m2? 1  Age > 50 (1-yes) 1  Neck circumference greater than:Male 16 inches or larger, Male 17inches or larger? 1  Male Gender (Yes=1) 1  Obstructive Sleep Apnea Score 5  Score 5 or greater  Results sent to PCP

## 2021-10-22 NOTE — Progress Notes (Signed)
PCP - Marton Redwood Cardiologist - Dr. Tamsen Snider  Chest x-ray - Not indicated EKG - 05/09/21 Stress Test - 10/12/20 ECHO - 10/11/20 Cardiac Cath - Denies  Sleep Study - Denies  Pre diabetic - CBG at PAT appt 128 Checks Blood Sugar daily averages 98-115 fasting  Blood Thinner Instructions: Coumadin Stop 6 days prior   ERAS Protcol - Yes  PRE-SURGERY Ensure given  COVID TEST- Monday February 6th   Anesthesia review: Yes B/P low at PAT appt, Jeneen Rinks consulted during appt.  Patient denies shortness of breath, fever, cough and chest pain at PAT appointment   All instructions explained to the patient, with a verbal understanding of the material. Patient agrees to go over the instructions while at home for a better understanding. Patient also instructed to wear a mask while in public after being tested for COVID-19. The opportunity to ask questions was provided.

## 2021-10-23 NOTE — Anesthesia Preprocedure Evaluation (Addendum)
Anesthesia Evaluation  Patient identified by MRN, date of birth, ID band Patient awake    Reviewed: Allergy & Precautions, H&P , NPO status , Patient's Chart, lab work & pertinent test results  Airway Mallampati: II   Neck ROM: full    Dental   Pulmonary COPD, former smoker,    breath sounds clear to auscultation       Cardiovascular hypertension, + Peripheral Vascular Disease and + DVT  + dysrhythmias Atrial Fibrillation  Rhythm:regular Rate:Normal     Neuro/Psych PSYCHIATRIC DISORDERS Depression  Neuromuscular disease CVA    GI/Hepatic   Endo/Other  diabetes, Type 2  Renal/GU      Musculoskeletal  (+) Arthritis ,   Abdominal   Peds  Hematology Protein S deficiency   Anesthesia Other Findings   Reproductive/Obstetrics                            Anesthesia Physical Anesthesia Plan  ASA: 3  Anesthesia Plan: General   Post-op Pain Management:    Induction: Intravenous  PONV Risk Score and Plan: 2 and Treatment may vary due to age or medical condition, Ondansetron and Dexamethasone  Airway Management Planned: Oral ETT  Additional Equipment:   Intra-op Plan:   Post-operative Plan: Extubation in OR  Informed Consent: I have reviewed the patients History and Physical, chart, labs and discussed the procedure including the risks, benefits and alternatives for the proposed anesthesia with the patient or authorized representative who has indicated his/her understanding and acceptance.     Dental advisory given  Plan Discussed with: CRNA, Anesthesiologist and Surgeon  Anesthesia Plan Comments: (PAT note by Karoline Caldwell, PA-C: Evaluated patient at preop testing appointment due to low blood pressure.  Multiple readings were taken by myself and other RNs and techs, all in the 80s/40s.  Patient states he does not recall previously having readings that low.  He denies any symptoms, denies  lightheadedness, dizziness, presyncope, syncope.  He denies any chest pain or palpitations.  He states he has had a recent cardiovascular evaluation by Dr. Einar Gip with normal stress test was told to follow-up on an as-needed basis.  He does take lisinopril 20 mg daily and states he took it this morning as usual.  He does report to some possible increased fatigue, however he has a very low baseline functional status secondary to multiple orthopedic issues.  He is currently in a wheelchair and does very little ambulation at home with the assistance of a walker.  I asked him if he had history of anemia, which she denied, however review of labs available in epic shows prior hgb in the 7-8 range in 2021 (notably, these were immediately s/p TKA).  He is on chronic Coumadin secondary to hypercoagulable state.  He denies any bleeding.  On exam he is in pain secondary to severe hip arthritis as well as chronic left shoulder pain.  He is alert and oriented x3.  Heart is regular rate and rhythm, heart sounds are distant.  Given that he is asymptomatic, I do not feel he needs acute evaluation for his hypotension.  He does have a home blood pressure cuff.  He is advised to check his blood pressure when gets home, and again tomorrow morning and afternoon.  I will call the patient to follow-up on the readings he gets at home.  If it remains as low as it was here, I will reach out to his PCP Dr. Marton Redwood  for assistance with optimization of patient for surgery.  He also discussed that if he were develop any presyncopal symptoms he should be evaluated urgently.  Last seen by Dr. Brigitte Pulse 10/08/21 for followup. BP at that time recorded as 128/70.  Per care everywhere, he was given Lovenox prescription at that time.   I called the patient on 10/23/2021 to follow-up on blood pressure.  I spoke with the patient's wife who had been using their automatic cuff to check the patient's pressure.  She reports she took 3 readings over the past  24 hours ranging from 114/64 at the lowest to 164/80 at the highest.  The patient has had no symptoms of dizziness, presyncope, syncope.  We discussed that the patient may have been a little dehydrated as his creatinine was mildly bumped on preop labs to 1.40.  She advised that the patient had not eaten lunch prior to his appointment and had not had his normal fluid intake either.  She was encouraged to make sure the patient staying adequately hydrated and continue to monitor his blood pressure leading up to surgery.  She understands that if the patient were to have any consistently low readings or develop any new symptoms she will contact their primary care provider for input.    Given that home readings are reasonable and patient is without symptoms, anticipate he can proceed with surgery as planned barring acute status change.  Preop labs reviewed, creatinine mildly elevated 1.40, hemoglobin mildly low 11.4, sodium mildly low at 134, otherwise unremarkable.  DM2 well controlled, A1c 6.5.  EKG 05/09/2021:  Normal sinus rhythm at rate of 85 bpm, normal axis, right bundle branch block.  No evidence of ischemia.  No significant change from 11/09/2020.  Lexiscan Tetrofosmin Stress Test 10/12/2020: Nondiagnostic ECG stress. Moderate degree medium extent fixed perfusion defect located in the apical inferior wall, mid inferior wall and basal inferior wall consistent with diaphragmatic attenuation. Minimal lateral ischemia cannot be excluded. Gated SPECT imaging of the left ventricle was normal. All segments of left ventricle demonstrated normal wall motion and thickening. Stress LV EF is normal 58%.  No previous exam available for comparison. Low risk.   Echocardiogram 10/11/2020: Normal LV systolic function with visual EF 55-60%. Left ventricle cavity is normal in size. Normal global wall motion. Moderate left ventricular hypertrophy. Doppler evidence of grade II (pseudonormal) diastolic dysfunction,  elevated LAP.  Left atrial cavity is moderately dilated. Aortic valve sclerosis without stenosis. Mild (Grade I) aortic regurgitation. Mild (Grade I) mitral regurgitation. Mild tricuspid regurgitation. No evidence of pulmonary hypertension. No prior study for comparison.  Carotid artery duplex 10/12/2020 Stenosis in the right internal carotid artery (1-15%) due to heterogeneous plaque. Stenosis in the right common carotid artery and bifurcation (<50%). Antegrade right vertebral artery flow.  Mild stenosis in the left proximal internal carotid artery (1-15%) due to heterogenous plaque. Mid to distal LICA not visualized (neck immobile).  Stenosis in the left common carotid artery (<50%). Stenosis in the left external carotid artery (<50%). Left vertebral artery not well visualized; however, by doppler predominantly antegrade flow. No prior studies for comparison. Follow up in one year is appropriate if clinically indicated.  )       Anesthesia Quick Evaluation

## 2021-10-28 ENCOUNTER — Other Ambulatory Visit (HOSPITAL_COMMUNITY)
Admission: RE | Admit: 2021-10-28 | Discharge: 2021-10-28 | Disposition: A | Discharge status: Home / Self Care | Payer: PPO | Source: Ambulatory Visit | Attending: Orthopaedic Surgery | Admitting: Orthopaedic Surgery

## 2021-10-28 DIAGNOSIS — I480 Paroxysmal atrial fibrillation: Secondary | ICD-10-CM | POA: Diagnosis not present

## 2021-10-28 DIAGNOSIS — Z96652 Presence of left artificial knee joint: Secondary | ICD-10-CM | POA: Diagnosis not present

## 2021-10-28 DIAGNOSIS — M1612 Unilateral primary osteoarthritis, left hip: Secondary | ICD-10-CM | POA: Diagnosis not present

## 2021-10-28 DIAGNOSIS — I10 Essential (primary) hypertension: Secondary | ICD-10-CM | POA: Diagnosis not present

## 2021-10-28 DIAGNOSIS — Z7901 Long term (current) use of anticoagulants: Secondary | ICD-10-CM | POA: Diagnosis not present

## 2021-10-28 DIAGNOSIS — Z85828 Personal history of other malignant neoplasm of skin: Secondary | ICD-10-CM | POA: Diagnosis not present

## 2021-10-28 DIAGNOSIS — Z20822 Contact with and (suspected) exposure to covid-19: Secondary | ICD-10-CM | POA: Diagnosis not present

## 2021-10-28 DIAGNOSIS — Z8546 Personal history of malignant neoplasm of prostate: Secondary | ICD-10-CM | POA: Diagnosis not present

## 2021-10-28 DIAGNOSIS — E119 Type 2 diabetes mellitus without complications: Secondary | ICD-10-CM | POA: Diagnosis not present

## 2021-10-28 DIAGNOSIS — Z86711 Personal history of pulmonary embolism: Secondary | ICD-10-CM | POA: Diagnosis not present

## 2021-10-28 DIAGNOSIS — J449 Chronic obstructive pulmonary disease, unspecified: Secondary | ICD-10-CM | POA: Diagnosis not present

## 2021-10-28 DIAGNOSIS — Z96641 Presence of right artificial hip joint: Secondary | ICD-10-CM | POA: Diagnosis not present

## 2021-10-28 DIAGNOSIS — Z87891 Personal history of nicotine dependence: Secondary | ICD-10-CM | POA: Diagnosis not present

## 2021-10-28 DIAGNOSIS — Z86718 Personal history of other venous thrombosis and embolism: Secondary | ICD-10-CM | POA: Diagnosis not present

## 2021-10-28 LAB — SARS CORONAVIRUS 2 (TAT 6-24 HRS): SARS Coronavirus 2: NEGATIVE

## 2021-10-30 ENCOUNTER — Telehealth: Payer: Self-pay | Admitting: *Deleted

## 2021-10-30 NOTE — Care Plan (Signed)
RNCM call to patient to discuss his upcoming Left Total Hip arthroplasty with Dr. Ninfa Linden on 10/31/21. He is an Ortho bundle patient through [facility] and is agreeable to case management. He lives with his spouse, who will be assisting at home after discharge. He has a RW, tub transfer bench and elevated toilets in the home already. No DME needed at this time. Referral made to [facility] for HHPT after discharge home. Choice provided. Reviewed all post op care instructions with patient's wife. Will continue to follow for needs.

## 2021-10-30 NOTE — Telephone Encounter (Signed)
Ortho bundle Pre-op call completed. 

## 2021-10-31 ENCOUNTER — Ambulatory Visit (HOSPITAL_COMMUNITY): Payer: PPO

## 2021-10-31 ENCOUNTER — Observation Stay (HOSPITAL_COMMUNITY): Payer: PPO

## 2021-10-31 ENCOUNTER — Encounter (HOSPITAL_COMMUNITY): Payer: Self-pay | Admitting: Orthopaedic Surgery

## 2021-10-31 ENCOUNTER — Other Ambulatory Visit: Payer: Self-pay

## 2021-10-31 ENCOUNTER — Encounter (HOSPITAL_COMMUNITY)
Admission: RE | Disposition: A | Discharge status: Home Health | Payer: Self-pay | Source: Home / Self Care | Attending: Orthopaedic Surgery

## 2021-10-31 ENCOUNTER — Ambulatory Visit (HOSPITAL_BASED_OUTPATIENT_CLINIC_OR_DEPARTMENT_OTHER): Payer: PPO | Admitting: Anesthesiology

## 2021-10-31 ENCOUNTER — Ambulatory Visit (HOSPITAL_COMMUNITY): Payer: PPO | Admitting: Physician Assistant

## 2021-10-31 ENCOUNTER — Observation Stay (HOSPITAL_COMMUNITY)
Admission: RE | Admit: 2021-10-31 | Discharge: 2021-11-04 | Disposition: A | Discharge status: Home Health | Payer: PPO | Attending: Orthopaedic Surgery | Admitting: Orthopaedic Surgery

## 2021-10-31 DIAGNOSIS — E1151 Type 2 diabetes mellitus with diabetic peripheral angiopathy without gangrene: Secondary | ICD-10-CM | POA: Diagnosis not present

## 2021-10-31 DIAGNOSIS — Z87891 Personal history of nicotine dependence: Secondary | ICD-10-CM | POA: Insufficient documentation

## 2021-10-31 DIAGNOSIS — Z96641 Presence of right artificial hip joint: Secondary | ICD-10-CM | POA: Insufficient documentation

## 2021-10-31 DIAGNOSIS — Z86718 Personal history of other venous thrombosis and embolism: Secondary | ICD-10-CM | POA: Insufficient documentation

## 2021-10-31 DIAGNOSIS — M1612 Unilateral primary osteoarthritis, left hip: Principal | ICD-10-CM

## 2021-10-31 DIAGNOSIS — Z85828 Personal history of other malignant neoplasm of skin: Secondary | ICD-10-CM | POA: Insufficient documentation

## 2021-10-31 DIAGNOSIS — Z96652 Presence of left artificial knee joint: Secondary | ICD-10-CM | POA: Insufficient documentation

## 2021-10-31 DIAGNOSIS — Z8546 Personal history of malignant neoplasm of prostate: Secondary | ICD-10-CM | POA: Insufficient documentation

## 2021-10-31 DIAGNOSIS — Z96642 Presence of left artificial hip joint: Secondary | ICD-10-CM | POA: Diagnosis not present

## 2021-10-31 DIAGNOSIS — I4891 Unspecified atrial fibrillation: Secondary | ICD-10-CM

## 2021-10-31 DIAGNOSIS — Z7901 Long term (current) use of anticoagulants: Secondary | ICD-10-CM | POA: Insufficient documentation

## 2021-10-31 DIAGNOSIS — Z471 Aftercare following joint replacement surgery: Secondary | ICD-10-CM | POA: Diagnosis not present

## 2021-10-31 DIAGNOSIS — I10 Essential (primary) hypertension: Secondary | ICD-10-CM | POA: Insufficient documentation

## 2021-10-31 DIAGNOSIS — E119 Type 2 diabetes mellitus without complications: Secondary | ICD-10-CM | POA: Insufficient documentation

## 2021-10-31 DIAGNOSIS — Z8673 Personal history of transient ischemic attack (TIA), and cerebral infarction without residual deficits: Secondary | ICD-10-CM | POA: Diagnosis not present

## 2021-10-31 DIAGNOSIS — J449 Chronic obstructive pulmonary disease, unspecified: Secondary | ICD-10-CM | POA: Insufficient documentation

## 2021-10-31 DIAGNOSIS — Z20822 Contact with and (suspected) exposure to covid-19: Secondary | ICD-10-CM | POA: Insufficient documentation

## 2021-10-31 DIAGNOSIS — Z86711 Personal history of pulmonary embolism: Secondary | ICD-10-CM | POA: Insufficient documentation

## 2021-10-31 DIAGNOSIS — Z01818 Encounter for other preprocedural examination: Secondary | ICD-10-CM

## 2021-10-31 DIAGNOSIS — Z419 Encounter for procedure for purposes other than remedying health state, unspecified: Secondary | ICD-10-CM

## 2021-10-31 DIAGNOSIS — I480 Paroxysmal atrial fibrillation: Secondary | ICD-10-CM | POA: Insufficient documentation

## 2021-10-31 HISTORY — PX: TOTAL HIP ARTHROPLASTY: SHX124

## 2021-10-31 LAB — PROTIME-INR
INR: 1.2 (ref 0.8–1.2)
Prothrombin Time: 15.3 seconds — ABNORMAL HIGH (ref 11.4–15.2)

## 2021-10-31 LAB — GLUCOSE, CAPILLARY
Glucose-Capillary: 107 mg/dL — ABNORMAL HIGH (ref 70–99)
Glucose-Capillary: 102 mg/dL — ABNORMAL HIGH (ref 70–99)
Glucose-Capillary: 133 mg/dL — ABNORMAL HIGH (ref 70–99)

## 2021-10-31 LAB — APTT: aPTT: 36 seconds (ref 24–36)

## 2021-10-31 SURGERY — ARTHROPLASTY, HIP, TOTAL, ANTERIOR APPROACH
Anesthesia: General | Site: Hip | Laterality: Left

## 2021-10-31 MED ORDER — CHLORHEXIDINE GLUCONATE 0.12 % MT SOLN
15.0000 mL | Freq: Once | OROMUCOSAL | Status: AC
  Administered 2021-10-31: 15 mL via OROMUCOSAL
  Filled 2021-10-31: qty 15

## 2021-10-31 MED ORDER — CEFAZOLIN SODIUM-DEXTROSE 2-4 GM/100ML-% IV SOLN
2.0000 g | INTRAVENOUS | Status: AC
  Administered 2021-10-31: 2 g via INTRAVENOUS
  Filled 2021-10-31: qty 100

## 2021-10-31 MED ORDER — TRAMADOL HCL 50 MG PO TABS
50.0000 mg | ORAL_TABLET | Freq: Four times a day (QID) | ORAL | Status: DC
  Administered 2021-10-31 – 2021-11-04 (×13): 50 mg via ORAL
  Filled 2021-10-31 (×13): qty 1

## 2021-10-31 MED ORDER — TRANEXAMIC ACID-NACL 1000-0.7 MG/100ML-% IV SOLN
1000.0000 mg | INTRAVENOUS | Status: AC
  Administered 2021-10-31: 1000 mg via INTRAVENOUS
  Filled 2021-10-31: qty 100

## 2021-10-31 MED ORDER — ONDANSETRON HCL 4 MG/2ML IJ SOLN
INTRAMUSCULAR | Status: DC | PRN
  Administered 2021-10-31: 4 mg via INTRAVENOUS

## 2021-10-31 MED ORDER — FENTANYL CITRATE (PF) 250 MCG/5ML IJ SOLN
INTRAMUSCULAR | Status: AC
  Filled 2021-10-31: qty 5

## 2021-10-31 MED ORDER — METHOCARBAMOL 1000 MG/10ML IJ SOLN
500.0000 mg | Freq: Four times a day (QID) | INTRAVENOUS | Status: DC | PRN
  Filled 2021-10-31: qty 5

## 2021-10-31 MED ORDER — OXYCODONE HCL 5 MG PO TABS: 5.0000 mg | ORAL_TABLET | Freq: Once | ORAL | Status: DC | PRN

## 2021-10-31 MED ORDER — POVIDONE-IODINE 10 % EX SWAB
2.0000 "application " | Freq: Once | CUTANEOUS | Status: AC
  Administered 2021-10-31: 2 via TOPICAL

## 2021-10-31 MED ORDER — FENTANYL CITRATE (PF) 100 MCG/2ML IJ SOLN
INTRAMUSCULAR | Status: DC | PRN
  Administered 2021-10-31 (×2): 50 ug via INTRAVENOUS
  Administered 2021-10-31: 100 ug via INTRAVENOUS
  Administered 2021-10-31: 50 ug via INTRAVENOUS

## 2021-10-31 MED ORDER — FENTANYL CITRATE (PF) 100 MCG/2ML IJ SOLN
25.0000 ug | INTRAMUSCULAR | Status: DC | PRN
  Administered 2021-10-31 (×4): 25 ug via INTRAVENOUS
  Administered 2021-10-31: 50 ug via INTRAVENOUS

## 2021-10-31 MED ORDER — SODIUM CHLORIDE 0.9 % IV SOLN: INTRAVENOUS | Status: DC

## 2021-10-31 MED ORDER — CEFAZOLIN SODIUM-DEXTROSE 1-4 GM/50ML-% IV SOLN
1.0000 g | Freq: Four times a day (QID) | INTRAVENOUS | Status: AC
  Administered 2021-10-31 (×2): 1 g via INTRAVENOUS
  Filled 2021-10-31 (×2): qty 50

## 2021-10-31 MED ORDER — METOCLOPRAMIDE HCL 5 MG PO TABS: 5.0000 mg | ORAL_TABLET | Freq: Three times a day (TID) | ORAL | Status: DC | PRN

## 2021-10-31 MED ORDER — LISINOPRIL 20 MG PO TABS
20.0000 mg | ORAL_TABLET | Freq: Every day | ORAL | Status: DC
  Administered 2021-11-02 – 2021-11-04 (×3): 20 mg via ORAL
  Filled 2021-10-31 (×5): qty 1

## 2021-10-31 MED ORDER — PANTOPRAZOLE SODIUM 40 MG PO TBEC
40.0000 mg | DELAYED_RELEASE_TABLET | Freq: Every day | ORAL | Status: DC
  Administered 2021-10-31 – 2021-11-04 (×5): 40 mg via ORAL
  Filled 2021-10-31 (×5): qty 1

## 2021-10-31 MED ORDER — LIDOCAINE 2% (20 MG/ML) 5 ML SYRINGE
INTRAMUSCULAR | Status: DC | PRN
  Administered 2021-10-31: 60 mg via INTRAVENOUS

## 2021-10-31 MED ORDER — PHENYLEPHRINE 40 MCG/ML (10ML) SYRINGE FOR IV PUSH (FOR BLOOD PRESSURE SUPPORT)
PREFILLED_SYRINGE | INTRAVENOUS | Status: DC | PRN
  Administered 2021-10-31: 80 ug via INTRAVENOUS
  Administered 2021-10-31: 120 ug via INTRAVENOUS
  Administered 2021-10-31: 80 ug via INTRAVENOUS
  Administered 2021-10-31: 120 ug via INTRAVENOUS
  Administered 2021-10-31: 80 ug via INTRAVENOUS

## 2021-10-31 MED ORDER — WARFARIN SODIUM 7.5 MG PO TABS
17.5000 mg | ORAL_TABLET | Freq: Once | ORAL | Status: AC
  Administered 2021-10-31: 17.5 mg via ORAL
  Filled 2021-10-31: qty 2

## 2021-10-31 MED ORDER — HYDROCODONE-ACETAMINOPHEN 5-325 MG PO TABS
1.0000 | ORAL_TABLET | ORAL | Status: DC | PRN
  Administered 2021-10-31 – 2021-11-01 (×2): 1 via ORAL
  Administered 2021-11-01: 2 via ORAL
  Administered 2021-11-03: 1 via ORAL
  Filled 2021-10-31 (×2): qty 1
  Filled 2021-10-31: qty 2
  Filled 2021-10-31: qty 1

## 2021-10-31 MED ORDER — WARFARIN - PHARMACIST DOSING INPATIENT: Freq: Every day | Status: DC

## 2021-10-31 MED ORDER — MENTHOL 3 MG MT LOZG: 1.0000 | LOZENGE | OROMUCOSAL | Status: DC | PRN

## 2021-10-31 MED ORDER — PROPOFOL 10 MG/ML IV BOLUS
INTRAVENOUS | Status: DC | PRN
  Administered 2021-10-31: 150 mg via INTRAVENOUS

## 2021-10-31 MED ORDER — SODIUM CHLORIDE 0.9 % IR SOLN
Status: DC | PRN
  Administered 2021-10-31: 3000 mL

## 2021-10-31 MED ORDER — HYDROCODONE-ACETAMINOPHEN 7.5-325 MG PO TABS
1.0000 | ORAL_TABLET | ORAL | Status: DC | PRN
  Administered 2021-10-31 – 2021-11-02 (×2): 2 via ORAL
  Filled 2021-10-31 (×2): qty 2

## 2021-10-31 MED ORDER — ONDANSETRON HCL 4 MG/2ML IJ SOLN: 4.0000 mg | Freq: Four times a day (QID) | INTRAMUSCULAR | Status: DC | PRN

## 2021-10-31 MED ORDER — DOCUSATE SODIUM 100 MG PO CAPS
100.0000 mg | ORAL_CAPSULE | Freq: Two times a day (BID) | ORAL | Status: DC
  Administered 2021-10-31 – 2021-11-04 (×8): 100 mg via ORAL
  Filled 2021-10-31 (×8): qty 1

## 2021-10-31 MED ORDER — DIPHENHYDRAMINE HCL 12.5 MG/5ML PO ELIX: 12.5000 mg | ORAL_SOLUTION | ORAL | Status: DC | PRN

## 2021-10-31 MED ORDER — MORPHINE SULFATE (PF) 2 MG/ML IV SOLN: 0.5000 mg | INTRAVENOUS | Status: DC | PRN

## 2021-10-31 MED ORDER — SUGAMMADEX SODIUM 200 MG/2ML IV SOLN
INTRAVENOUS | Status: DC | PRN
  Administered 2021-10-31: 200 mg via INTRAVENOUS

## 2021-10-31 MED ORDER — ROCURONIUM BROMIDE 10 MG/ML (PF) SYRINGE
PREFILLED_SYRINGE | INTRAVENOUS | Status: DC | PRN
  Administered 2021-10-31: 60 mg via INTRAVENOUS

## 2021-10-31 MED ORDER — METHOCARBAMOL 500 MG PO TABS
500.0000 mg | ORAL_TABLET | Freq: Four times a day (QID) | ORAL | Status: DC | PRN
  Administered 2021-11-02 – 2021-11-03 (×2): 500 mg via ORAL
  Filled 2021-10-31 (×2): qty 1

## 2021-10-31 MED ORDER — ORAL CARE MOUTH RINSE: 15.0000 mL | Freq: Once | OROMUCOSAL | Status: AC

## 2021-10-31 MED ORDER — VITAMIN D 25 MCG (1000 UNIT) PO TABS
2000.0000 [IU] | ORAL_TABLET | Freq: Every day | ORAL | Status: DC
  Administered 2021-10-31 – 2021-11-04 (×5): 2000 [IU] via ORAL
  Filled 2021-10-31 (×5): qty 2

## 2021-10-31 MED ORDER — INSULIN ASPART 100 UNIT/ML IJ SOLN: 0.0000 [IU] | INTRAMUSCULAR | Status: DC | PRN

## 2021-10-31 MED ORDER — FENTANYL CITRATE (PF) 100 MCG/2ML IJ SOLN
INTRAMUSCULAR | Status: AC
  Filled 2021-10-31: qty 2

## 2021-10-31 MED ORDER — METOCLOPRAMIDE HCL 5 MG/ML IJ SOLN: 5.0000 mg | Freq: Three times a day (TID) | INTRAMUSCULAR | Status: DC | PRN

## 2021-10-31 MED ORDER — PHENOL 1.4 % MT LIQD: 1.0000 | OROMUCOSAL | Status: DC | PRN

## 2021-10-31 MED ORDER — ONDANSETRON HCL 4 MG PO TABS: 4.0000 mg | ORAL_TABLET | Freq: Four times a day (QID) | ORAL | Status: DC | PRN

## 2021-10-31 MED ORDER — LACTATED RINGERS IV SOLN: INTRAVENOUS | Status: DC

## 2021-10-31 MED ORDER — ALUM & MAG HYDROXIDE-SIMETH 200-200-20 MG/5ML PO SUSP: 30.0000 mL | ORAL | Status: DC | PRN

## 2021-10-31 MED ORDER — OXYCODONE HCL 5 MG/5ML PO SOLN: 5.0000 mg | Freq: Once | ORAL | Status: DC | PRN

## 2021-10-31 MED ORDER — PHENYLEPHRINE HCL-NACL 20-0.9 MG/250ML-% IV SOLN
INTRAVENOUS | Status: DC | PRN
  Administered 2021-10-31: 100 ug/min via INTRAVENOUS

## 2021-10-31 MED ORDER — METFORMIN HCL 500 MG PO TABS
500.0000 mg | ORAL_TABLET | Freq: Every day | ORAL | Status: DC
  Administered 2021-11-01 – 2021-11-04 (×4): 500 mg via ORAL
  Filled 2021-10-31 (×4): qty 1

## 2021-10-31 MED ORDER — DEXAMETHASONE SODIUM PHOSPHATE 4 MG/ML IJ SOLN
INTRAMUSCULAR | Status: DC | PRN
  Administered 2021-10-31: 4 mg via INTRAVENOUS

## 2021-10-31 MED ORDER — ACETAMINOPHEN 325 MG PO TABS: 325.0000 mg | ORAL_TABLET | Freq: Four times a day (QID) | ORAL | Status: DC | PRN

## 2021-10-31 SURGICAL SUPPLY — 55 items
ARTICULEZE HEAD (Hips) ×2 IMPLANT
BAG COUNTER SPONGE SURGICOUNT (BAG) ×2 IMPLANT
BENZOIN TINCTURE PRP APPL 2/3 (GAUZE/BANDAGES/DRESSINGS) ×2 IMPLANT
BLADE CLIPPER SURG (BLADE) IMPLANT
BLADE SAW SGTL 18X1.27X75 (BLADE) ×3 IMPLANT
COVER SURGICAL LIGHT HANDLE (MISCELLANEOUS) ×2 IMPLANT
CUP ACET PNNCL SECTR W/GRIP 56 (Hips) IMPLANT
DRAPE C-ARM 42X72 X-RAY (DRAPES) ×2 IMPLANT
DRAPE STERI IOBAN 125X83 (DRAPES) ×2 IMPLANT
DRAPE U-SHAPE 47X51 STRL (DRAPES) ×6 IMPLANT
DRSG AQUACEL AG ADV 3.5X10 (GAUZE/BANDAGES/DRESSINGS) ×2 IMPLANT
DURAPREP 26ML APPLICATOR (WOUND CARE) ×2 IMPLANT
ELECT BLADE 4.0 EZ CLEAN MEGAD (MISCELLANEOUS) ×2
ELECT BLADE 6.5 EXT (BLADE) IMPLANT
ELECT REM PT RETURN 9FT ADLT (ELECTROSURGICAL) ×2
ELECTRODE BLDE 4.0 EZ CLN MEGD (MISCELLANEOUS) ×1 IMPLANT
ELECTRODE REM PT RTRN 9FT ADLT (ELECTROSURGICAL) ×1 IMPLANT
FACESHIELD WRAPAROUND (MASK) ×4 IMPLANT
FACESHIELD WRAPAROUND OR TEAM (MASK) ×2 IMPLANT
GAUZE XEROFORM 5X9 LF (GAUZE/BANDAGES/DRESSINGS) ×1 IMPLANT
GLOVE SRG 8 PF TXTR STRL LF DI (GLOVE) ×2 IMPLANT
GLOVE SURG LTX SZ8 (GLOVE) ×2 IMPLANT
GLOVE SURG ORTHO LTX SZ7.5 (GLOVE) ×4 IMPLANT
GLOVE SURG UNDER POLY LF SZ8 (GLOVE) ×2
GOWN STRL REUS W/ TWL LRG LVL3 (GOWN DISPOSABLE) ×2 IMPLANT
GOWN STRL REUS W/ TWL XL LVL3 (GOWN DISPOSABLE) ×2 IMPLANT
GOWN STRL REUS W/TWL LRG LVL3 (GOWN DISPOSABLE) ×2
GOWN STRL REUS W/TWL XL LVL3 (GOWN DISPOSABLE) ×2
HANDPIECE INTERPULSE COAX TIP (DISPOSABLE) ×1
HEAD ARTICULEZE (Hips) IMPLANT
KIT BASIN OR (CUSTOM PROCEDURE TRAY) ×2 IMPLANT
KIT TURNOVER KIT B (KITS) ×2 IMPLANT
MANIFOLD NEPTUNE II (INSTRUMENTS) ×2 IMPLANT
NS IRRIG 1000ML POUR BTL (IV SOLUTION) ×2 IMPLANT
PACK TOTAL JOINT (CUSTOM PROCEDURE TRAY) ×2 IMPLANT
PAD ARMBOARD 7.5X6 YLW CONV (MISCELLANEOUS) ×2 IMPLANT
PINN SECTOR W/GRIP ACE CUP 56 (Hips) ×2 IMPLANT
PINNACLE ALTRX PLUS 4 N 36X56 (Hips) ×1 IMPLANT
SET HNDPC FAN SPRY TIP SCT (DISPOSABLE) ×1 IMPLANT
STAPLER VISISTAT 35W (STAPLE) IMPLANT
STEM FEMORAL SZ5 HIGH ACTIS (Stem) ×1 IMPLANT
STRIP CLOSURE SKIN 1/2X4 (GAUZE/BANDAGES/DRESSINGS) ×4 IMPLANT
SUT ETHIBOND NAB CT1 #1 30IN (SUTURE) ×2 IMPLANT
SUT VIC AB 0 CT1 27 (SUTURE) ×1
SUT VIC AB 0 CT1 27XBRD ANBCTR (SUTURE) ×1 IMPLANT
SUT VIC AB 1 CT1 27 (SUTURE) ×1
SUT VIC AB 1 CT1 27XBRD ANBCTR (SUTURE) ×1 IMPLANT
SUT VIC AB 2-0 CT1 27 (SUTURE) ×2
SUT VIC AB 2-0 CT1 TAPERPNT 27 (SUTURE) ×1 IMPLANT
TOWEL GREEN STERILE (TOWEL DISPOSABLE) ×2 IMPLANT
TOWEL GREEN STERILE FF (TOWEL DISPOSABLE) ×2 IMPLANT
TRAY CATH 16FR W/PLASTIC CATH (SET/KITS/TRAYS/PACK) IMPLANT
TRAY FOLEY W/BAG SLVR 16FR (SET/KITS/TRAYS/PACK)
TRAY FOLEY W/BAG SLVR 16FR ST (SET/KITS/TRAYS/PACK) IMPLANT
WATER STERILE IRR 1000ML POUR (IV SOLUTION) ×4 IMPLANT

## 2021-10-31 NOTE — Plan of Care (Signed)

## 2021-10-31 NOTE — Evaluation (Signed)
Physical Therapy Evaluation Patient Details Name: Colton Miranda MRN: [id number] DOB: 06-29-1941 Today's Date: 10/31/2021  History of Present Illness  Pt is an 81 y/o male s/p L THA. PMH includes COPD, prostate cancer, a fib, HTN, CVA, DVT, PE, L TKA, and R THA.  Clinical Impression  Pt admitted secondary to problem above with deficits below. Pt requiring min to mod A +2 to perform mobility tasks this session. Limited secondary to pain. Anticipate pt will progress well once pain improves. Will continue to follow acutely.        Recommendations for follow up therapy are one component of a multi-disciplinary discharge planning process, led by the attending physician.  Recommendations may be updated based on patient status, additional functional criteria and insurance authorization.  Follow Up Recommendations Follow physician's recommendations for discharge plan and follow up therapies    Assistance Recommended at Discharge Frequent or constant Supervision/Assistance  Patient can return home with the following  A little help with walking and/or transfers;A little help with bathing/dressing/bathroom    Equipment Recommendations None recommended by PT  Recommendations for Other Services       Functional Status Assessment Patient has had a recent decline in their functional status and demonstrates the ability to make significant improvements in function in a reasonable and predictable amount of time.     Precautions / Restrictions Precautions Precautions: Fall Restrictions Weight Bearing Restrictions: Yes LLE Weight Bearing: Weight bearing as tolerated      Mobility  Bed Mobility Overal bed mobility: Needs Assistance Bed Mobility: Supine to Sit, Sit to Supine     Supine to sit: Mod assist, +2 for physical assistance Sit to supine: Mod assist, +2 for physical assistance   General bed mobility comments: Assist for trunk and LE assist. Increased time required     Transfers Overall transfer level: Needs assistance Equipment used: Rolling walker (2 wheels) Transfers: Sit to/from Stand Sit to Stand: Min assist, Mod assist, +2 physical assistance           General transfer comment: Initially requiring mod A +2 to stand, but able to stand with min A +2 on second attemp. Able to take side steps at EOB with min A +2 for steadying.    Ambulation/Gait                  Stairs            Wheelchair Mobility    Modified Rankin (Stroke Patients Only)       Balance Overall balance assessment: Needs assistance Sitting-balance support: No upper extremity supported, Feet supported Sitting balance-Leahy Scale: Fair     Standing balance support: Bilateral upper extremity supported Standing balance-Leahy Scale: Poor Standing balance comment: Reliant on BUE support                             Pertinent Vitals/Pain Pain Assessment Pain Assessment: 0-10 Pain Score: 7  Pain Location: L hip Pain Descriptors / Indicators: Guarding, Grimacing Pain Intervention(s): Limited activity within patient's tolerance    Home Living Family/patient expects to be discharged to:: Private residence Living Arrangements: Spouse/significant other Available Help at Discharge: Family;Available 24 hours/day Type of Home: Apartment Home Access: Stairs to enter Entrance Stairs-Rails: None Entrance Stairs-Number of Steps: 1   Home Layout: One level Home Equipment: Conservation officer, nature (2 wheels);BSC/3in1;Tub bench;Grab bars - tub/shower      Prior Function Prior Level of Function : Needs assist  Mobility Comments: Used RW for ambulation ADLs Comments: Needs assist with putting socks and shoes on     Hand Dominance        Extremity/Trunk Assessment   Upper Extremity Assessment Upper Extremity Assessment: Defer to OT evaluation    Lower Extremity Assessment Lower Extremity Assessment: LLE deficits/detail LLE  Deficits / Details: Deficits consistent with post op pain and weakness.    Cervical / Trunk Assessment Cervical / Trunk Assessment: Kyphotic (forward head)  Communication   Communication: No difficulties  Cognition Arousal/Alertness: Awake/alert Behavior During Therapy: WFL for tasks assessed/performed Overall Cognitive Status: Within Functional Limits for tasks assessed                                          General Comments      Exercises     Assessment/Plan    PT Assessment Patient needs continued PT services  PT Problem List Decreased strength;Decreased range of motion;Decreased balance;Decreased mobility;Decreased activity tolerance;Decreased knowledge of use of DME;Pain       PT Treatment Interventions DME instruction;Gait training;Stair training;Functional mobility training;Therapeutic activities;Therapeutic exercise;Patient/family education;Balance training    PT Goals (Current goals can be found in the Care Plan section)  Acute Rehab PT Goals Patient Stated Goal: to go home PT Goal Formulation: With patient Time For Goal Achievement: 11/14/21 Potential to Achieve Goals: Good    Frequency 7X/week     Co-evaluation               AM-PAC PT "6 Clicks" Mobility  Outcome Measure Help needed turning from your back to your side while in a flat bed without using bedrails?: A Lot Help needed moving from lying on your back to sitting on the side of a flat bed without using bedrails?: Total Help needed moving to and from a bed to a chair (including a wheelchair)?: A Lot Help needed standing up from a chair using your arms (e.g., wheelchair or bedside chair)?: A Lot Help needed to walk in hospital room?: Total Help needed climbing 3-5 steps with a railing? : Total 6 Click Score: 9    End of Session Equipment Utilized During Treatment: Gait belt Activity Tolerance: Patient tolerated treatment well Patient left: in bed;with call bell/phone  within reach;with family/visitor present Nurse Communication: Mobility status PT Visit Diagnosis: Other abnormalities of gait and mobility (R26.89);Pain Pain - Right/Left: Left Pain - part of body: Hip    Time: 5284-1324 PT Time Calculation (min) (ACUTE ONLY): 29 min   Charges:   PT Evaluation $PT Eval Low Complexity: 1 Low PT Treatments $Therapeutic Activity: 8-22 mins        Lou Miner, DPT  Acute Rehabilitation Services  Pager: [phone] Office: [phone]   Rudean Hitt 10/31/2021, 5:00 PM

## 2021-10-31 NOTE — Brief Op Note (Signed)
10/31/2021  12:24 PM  PATIENT:  Elizbeth Squires  81 y.o. male  PRE-OPERATIVE DIAGNOSIS:  osteoarthritis left hip  POST-OPERATIVE DIAGNOSIS:  osteoarthritis left hip  PROCEDURE:  Procedure(s): LEFT TOTAL HIP ARTHROPLASTY ANTERIOR APPROACH (Left)  SURGEON:  Surgeon(s) and Role:    Mcarthur Rossetti, MD - Primary  PHYSICIAN ASSISTANT:  Benita Stabile, PA-C  ANESTHESIA:   general  EBL:  150 mL   COUNTS:  YES  DICTATION: .Other Dictation: Dictation Number [id number]  PLAN OF CARE: Admit for overnight observation  PATIENT DISPOSITION:  PACU - hemodynamically stable.   Delay start of Pharmacological VTE agent (>24hrs) due to surgical blood loss or risk of bleeding: no

## 2021-10-31 NOTE — Progress Notes (Addendum)
ANTICOAGULATION CONSULT NOTE - Follow Up Consult  Pharmacy Consult for Warfarin Indication:  Atrial Fibrillation, Hx VTE, Hypercoaguable State (per PCP encounter documentation)  No Known Allergies  Patient Measurements: Height: 5\' 6"  (167.6 cm) Weight: 101.2 kg (223 lb) IBW/kg (Calculated) : 63.8   Vital Signs: Temp: 99.1 F (37.3 C) (02/09 1415) Temp Source: Oral (02/09 0711) BP: 108/47 (02/09 1430) Pulse Rate: 62 (02/09 1430)  Labs: Recent Labs    10/31/21 0810  APTT 36  LABPROT 15.3*  INR 1.2    Estimated Creatinine Clearance: 46.9 mL/min (A) (by C-G formula based on SCr of 1.4 mg/dL (H)).  Assessment: 81 yr old man with prostate cancer, S/P L total hip arthroplasty today. Pt was on warfarin PTA for atrial fibrillation, hx DVT/PE, hypercoagulable state, protein C/S deficiency. Pt was taking warfarin 17.5 mg on Mondays and 15 mg on other days of the week. Per pt's wife, he was bridged with Lovenox before the procedure (last dose yesterday); per Dr Ninfa Linden this afternoon, pt to resume warfarin this evening with no bridging.  H/H 11.4/35.3, plt 292; INR today 1.2. Per RN, no bleeding issues observed post op. Pt on thin diet. No drug-drug interaction issues at this time.  Goal of Therapy:  INR 2-3 Monitor platelets by anticoagulation protocol: Yes   Plan:  Warfarin 17.5 mg X 1 this evening Monitor daily INR, CBC Monitor for bleeding  Gillermina Hu, PharmD, BCPS, [facility] Clinical Pharmacist 10/31/2021,2:45 PM

## 2021-10-31 NOTE — H&P (Signed)
TOTAL HIP ADMISSION H&P  Patient is admitted for left total hip arthroplasty.  Subjective:  Chief Complaint: left hip pain  HPI: Colton Miranda, 81 y.o. male, has a history of pain and functional disability in the left hip(s) due to arthritis and patient has failed non-surgical conservative treatments for greater than 12 weeks to include NSAID's and/or analgesics, corticosteriod injections, flexibility and strengthening excercises, use of assistive devices, weight reduction as appropriate, and activity modification.  Onset of symptoms was gradual starting 3 years ago with gradually worsening course since that time.The patient noted no past surgery on the left hip(s).  Patient currently rates pain in the left hip at 10 out of 10 with activity. Patient has night pain, worsening of pain with activity and weight bearing, pain that interfers with activities of daily living, and pain with passive range of motion. Patient has evidence of subchondral cysts, subchondral sclerosis, periarticular osteophytes, and joint space narrowing by imaging studies. This condition presents safety issues increasing the risk of falls.  There is no current active infection.  Patient Active Problem List   Diagnosis Date Noted   Unilateral primary osteoarthritis, left hip 07/29/2021   Anemia 02/14/2020   Hyponatremia 02/14/2020   S/P total knee arthroplasty, left 02/14/2020   Osteoarthritis of left knee 02/10/2020   Contact dermatitis 01/19/2020   PAF (paroxysmal atrial fibrillation) ([place]) 04/14/2018   Malignant neoplasm of prostate ([place]) 10/07/2017   Colon polyps 11/26/2016   Influenza vaccination declined 05/28/2016   History of fall 07/25/2015   Palpitations 11/08/2014   Pain of left arm 11/08/2014   Pain in joint, lower leg 02/28/2014   Essential hypertension 02/28/2014   Disturbance of skin sensation 02/23/2013   Insomnia, unspecified 02/23/2013   Obesity    DM (diabetes mellitus), secondary,  uncontrolled, with peripheral vascular complications    Hyperlipemia    Urinary frequency    Primary hypercoagulable state ([place]) 01/03/2013   Other pulmonary embolism and infarction 01/03/2013   Long term current use of anticoagulant therapy 01/03/2013   Past Medical History:  Diagnosis Date   BPH associated with nocturia    Chronic constipation    COPD (chronic obstructive pulmonary disease) (HCC)    Depression    Eczema    Heart murmur    at birth   History of adenomatous polyp of colon    History of basal cell carcinoma (BCC) of skin    post excision from trunk   History of CVA (cerebrovascular accident) without residual deficits    11-06-2017 per pt and wife "stroke was approx. 20 years ago , 1999, caused by blood clot after knee scope"  DVT to PE   History of depression    over 40 years ago nervous breakdown   History of DVT (deep vein thrombosis)    11-06-2017 per pt and wife happened lower leg approx. 1999 after knee scope   History of pulmonary embolus (PE)    11-06-2017 from DVT in approx. 1999 per wife and pt   HOH (hard of hearing)    Hyperlipidemia    Hypertension    followed by pcp   Long term (current) use of anticoagulants Coumadin-- followed by Pharmasist w/ [facility]   secondary to primary hypercoagulopathy w/ hx DVT and PE   Myalgia and myositis, unspecified    OA (osteoarthritis)    "all joints"   PAF (paroxysmal atrial fibrillation) ([place]) 2019   Peripheral neuropathy    Pinched nerve in neck    per  pt causes intermittant numbness bilateral arms and hands   Primary hypercoagulable state [facility])    Prostate cancer [facility]) urologist-  dr Alyson Ingles  oncologist-- dr Tammi Klippel   dx 08-28-2017  Stage T1c, Gleason 4+4, PSA 14.1, vol 42.8cc-- planned treatment external radiation and ADT   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[place])    Protein S deficiency ([place])    Right bundle branch block    Seborrheic dermatitis, unspecified    Stroke ([place])    Type 2 diabetes mellitus  ([place])    Urinary frequency    Wears glasses     Past Surgical History:  Procedure Laterality Date   [place]  last one 01-28-2017   GOLD SEED IMPLANT N/A 11/16/2017   Procedure: GOLD SEED IMPLANT;  Surgeon: Cleon Gustin, MD;  Location: [facility];  Service: Urology;  Laterality: N/A;   KNEE ARTHROSCOPY Right 1990s   PILONIDAL CYST EXCISION  1968   PROSTATE BIOPSY  09/10/2017    (done in office)   + Pos for Prostate Cancer, Dr.MCkinzie( [facility])    SPACE OAR INSTILLATION N/A 11/16/2017   Procedure: SPACE OAR INSTILLATION;  Surgeon: Cleon Gustin, MD;  Location: [facility];  Service: Urology;  Laterality: N/A;   TONSILLECTOMY     TOTAL HIP ARTHROPLASTY Right 06-18-2009   dr Wynelle Link  [facility]   TOTAL KNEE ARTHROPLASTY Left 02/10/2020   Procedure: TOTAL KNEE ARTHROPLASTY;  Surgeon: Sydnee Cabal, MD;  Location: [facility];  Service: Orthopedics;  Laterality: Left;  adductor canale    Current Facility-Administered Medications  Medication Dose Route Frequency Provider Last Rate Last Admin   ceFAZolin (ANCEF) IVPB 2g/100 mL premix  2 g Intravenous On Call to OR Pete Pelt, PA-C       chlorhexidine (PERIDEX) 0.12 % solution 15 mL  15 mL Mouth/Throat Once Duane Boston, MD       Or   MEDLINE mouth rinse  15 mL Mouth Rinse Once Duane Boston, MD       insulin aspart (novoLOG) injection 0-7 Units  0-7 Units Subcutaneous Q2H PRN Albertha Ghee, MD       lactated ringers infusion   Intravenous Continuous Duane Boston, MD       povidone-iodine 10 % swab 2 application  2 application Topical Once Erskine Emery W, PA-C       tranexamic acid (CYKLOKAPRON) IVPB 1,000 mg  1,000 mg Intravenous To OR Pete Pelt, PA-C       No Known Allergies  Social History   Tobacco Use   Smoking status: Former    Packs/day: 0.50    Years: 50.00    Pack years: 25.00    Types: Cigarettes    Quit date: 01/03/2009    Years  since quitting: 12.8   Smokeless tobacco: Never  Substance Use Topics   Alcohol use: Yes    Alcohol/week: 3.0 standard drinks    Types: 3 Cans of beer per week    Comment: 1-2 beers a week    Family History  Problem Relation Age of Onset   Cancer Mother 35       colon , breast   Colon cancer Mother    Parkinson's disease Sister    Cancer Sister        breast   Cancer Maternal Uncle        melanoma   Cancer Paternal Uncle    Heart attack Daughter  2008     Review of Systems  Musculoskeletal:  Positive for joint swelling.  All other systems reviewed and are negative.  Objective:  Physical Exam Vitals reviewed.  Constitutional:      Appearance: Normal appearance.  HENT:     Head: Normocephalic and atraumatic.  Eyes:     Extraocular Movements: Extraocular movements intact.     Pupils: Pupils are equal, round, and reactive to light.  Cardiovascular:     Rate and Rhythm: Normal rate.     Pulses: Normal pulses.  Pulmonary:     Effort: Pulmonary effort is normal.  Abdominal:     Palpations: Abdomen is soft.  Musculoskeletal:     Cervical back: Normal range of motion.     Left hip: Tenderness and bony tenderness present. Decreased range of motion. Decreased strength.  Neurological:     Mental Status: He is alert and oriented to person, place, and time.  Psychiatric:        Behavior: Behavior normal.    Vital signs in last 24 hours: Temp:  [98.1 F (36.7 C)] 98.1 F (36.7 C) (02/09 0711) Pulse Rate:  [90] 90 (02/09 0711) Resp:  [18] 18 (02/09 0711) BP: (188)/(68) 188/68 (02/09 0711) SpO2:  [98 %] 98 % (02/09 0711) Weight:  [101.2 kg] 101.2 kg (02/09 0711)  Labs:   Estimated body mass index is 35.99 kg/m as calculated from the following:   Height as of this encounter: 5\' 6"  (1.676 m).   Weight as of this encounter: 101.2 kg.   Imaging Review Plain radiographs demonstrate severe degenerative joint disease of the left hip(s). The bone quality appears  to be good for age and reported activity level.      Assessment/Plan:  End stage arthritis, left hip(s)  The patient history, physical examination, clinical judgement of the provider and imaging studies are consistent with end stage degenerative joint disease of the left hip(s) and total hip arthroplasty is deemed medically necessary. The treatment options including medical management, injection therapy, arthroscopy and arthroplasty were discussed at length. The risks and benefits of total hip arthroplasty were presented and reviewed. The risks due to aseptic loosening, infection, stiffness, dislocation/subluxation,  thromboembolic complications and other imponderables were discussed.  The patient acknowledged the explanation, agreed to proceed with the plan and consent was signed. Patient is being admitted for inpatient treatment for surgery, pain control, PT, OT, prophylactic antibiotics, VTE prophylaxis, progressive ambulation and ADL's and discharge planning.The patient is planning to be discharged home with home health services

## 2021-10-31 NOTE — Anesthesia Procedure Notes (Signed)
Procedure Name: Intubation Date/Time: 10/31/2021 11:15 AM Performed by: Lieutenant Diego, CRNA Pre-anesthesia Checklist: Patient identified, Emergency Drugs available, Suction available and Patient being monitored Patient Re-evaluated:Patient Re-evaluated prior to induction Oxygen Delivery Method: Circle system utilized Preoxygenation: Pre-oxygenation with 100% oxygen Induction Type: IV induction Ventilation: Mask ventilation without difficulty Laryngoscope Size: Miller and 2 Grade View: Grade I Tube type: Oral Tube size: 7.5 mm Number of attempts: 1 Airway Equipment and Method: Stylet and Oral airway Placement Confirmation: ETT inserted through vocal cords under direct vision, positive ETCO2 and breath sounds checked- equal and bilateral Secured at: 22 cm Tube secured with: Tape Dental Injury: Teeth and Oropharynx as per pre-operative assessment

## 2021-10-31 NOTE — Transfer of Care (Signed)
Immediate Anesthesia Transfer of Care Note  Patient: Elizbeth Squires  Procedure(s) Performed: LEFT TOTAL HIP ARTHROPLASTY ANTERIOR APPROACH (Left: Hip)  Patient Location: PACU  Anesthesia Type:General  Level of Consciousness: awake  Airway & Oxygen Therapy: Patient Spontanous Breathing and Patient connected to face mask oxygen  Post-op Assessment: Report given to RN and Post -op Vital signs reviewed and stable  Post vital signs: Reviewed and stable  Last Vitals:  Vitals Value Taken Time  BP 149/61 10/31/21 1243  Temp    Pulse 69 10/31/21 1246  Resp 27 10/31/21 1246  SpO2 98 % 10/31/21 1246  Vitals shown include unvalidated device data.  Last Pain:  Vitals:   10/31/21 0751  TempSrc:   PainSc: 7       Patients Stated Pain Goal: 2 (69/50/72 2575)  Complications: No notable events documented.

## 2021-11-01 DIAGNOSIS — M1612 Unilateral primary osteoarthritis, left hip: Secondary | ICD-10-CM | POA: Diagnosis not present

## 2021-11-01 LAB — CBC
HCT: 30.1 % — ABNORMAL LOW (ref 39.0–52.0)
Hemoglobin: 10.1 g/dL — ABNORMAL LOW (ref 13.0–17.0)
MCH: 28.6 pg (ref 26.0–34.0)
MCHC: 33.6 g/dL (ref 30.0–36.0)
MCV: 85.3 fL (ref 80.0–100.0)
Platelets: 202 10*3/uL (ref 150–400)
RBC: 3.53 MIL/uL — ABNORMAL LOW (ref 4.22–5.81)
RDW: 14.4 % (ref 11.5–15.5)
WBC: 9.6 10*3/uL (ref 4.0–10.5)
nRBC: 0 % (ref 0.0–0.2)

## 2021-11-01 LAB — PROTIME-INR
INR: 1.2 (ref 0.8–1.2)
Prothrombin Time: 15.1 seconds (ref 11.4–15.2)

## 2021-11-01 LAB — BASIC METABOLIC PANEL
Anion gap: 6 (ref 5–15)
BUN: 19 mg/dL (ref 8–23)
CO2: 25 mmol/L (ref 22–32)
Calcium: 8.8 mg/dL — ABNORMAL LOW (ref 8.9–10.3)
Chloride: 102 mmol/L (ref 98–111)
Creatinine, Ser: 1.19 mg/dL (ref 0.61–1.24)
GFR, Estimated: >60 mL/min (ref >60)
Glucose, Bld: 128 mg/dL — ABNORMAL HIGH (ref 70–99)
Potassium: 4.5 mmol/L (ref 3.5–5.1)
Sodium: 133 mmol/L — ABNORMAL LOW (ref 135–145)

## 2021-11-01 MED ORDER — WARFARIN SODIUM 7.5 MG PO TABS
15.0000 mg | ORAL_TABLET | Freq: Once | ORAL | Status: AC
  Administered 2021-11-01: 15 mg via ORAL
  Filled 2021-11-01: qty 2

## 2021-11-01 MED ORDER — ENOXAPARIN SODIUM 100 MG/ML IJ SOSY
100.0000 mg | PREFILLED_SYRINGE | Freq: Once | INTRAMUSCULAR | Status: AC
  Administered 2021-11-01: 100 mg via SUBCUTANEOUS
  Filled 2021-11-01 (×2): qty 1

## 2021-11-01 MED ORDER — HYDROCODONE-ACETAMINOPHEN 5-325 MG PO TABS: 1.0000 | ORAL_TABLET | Freq: Four times a day (QID) | ORAL | 0 refills | Status: DC | PRN

## 2021-11-01 MED ORDER — ENOXAPARIN SODIUM 40 MG/0.4ML IJ SOSY: 40.0000 mg | PREFILLED_SYRINGE | Freq: Once | INTRAMUSCULAR | Status: DC

## 2021-11-01 NOTE — Care Plan (Signed)
Ortho Bundle Case Management Note  Patient Details  Name: Colton Miranda MRN: [id number] Date of Birth: 05-16-1941  [facility] call to patient to discuss his upcoming Left Total Hip arthroplasty with Dr. Ninfa Linden on 10/31/21. He is an Ortho bundle patient through [facility] and is agreeable to case management. He lives with his spouse, who will be assisting at home after discharge. He has a RW, tub transfer bench and elevated toilets in the home already. No DME needed at this time. Referral made to [facility] for HHPT after discharge home. Choice provided. Reviewed all post op care instructions with patient's wife. Will continue to follow for needs.                   DME Arranged:   (Patient has RW already; elevated toilets in the home) DME Agency:     HH Arranged:  PT [place]:  [place]  Additional Comments: Please contact me with any questions of if this plan should need to change.  Jamse Arn, RN, BSN, [employer]  [phone] 11/01/2021, 10:09 AM

## 2021-11-01 NOTE — Care Management Obs Status (Signed)
[place] NOTIFICATION   Patient Details  Name: Colton Miranda MRN: [id number] Date of Birth: [birth year]   Medicare Observation Status Notification Given:  Yes    Joanne Chars, LCSW 11/01/2021, 3:24 PM

## 2021-11-01 NOTE — Op Note (Signed)
NAME: Colton Miranda, ERNY MEDICAL RECORD NO: [id number] ACCOUNT NO: [id number] DATE OF BIRTH: 1941-03-11 FACILITY: [facility] LOCATION: [facility] PHYSICIAN: Lind Guest. Ninfa Linden, MD  Operative Report   DATE OF PROCEDURE: 10/31/2021  PREOPERATIVE DIAGNOSIS:  Primary arthritis and degenerative joint disease, left hip.  POSTOPERATIVE DIAGNOSIS:  Primary arthritis and degenerative joint disease, left hip.  PROCEDURE:  Left total hip arthroplasty through direct anterior approach.  IMPLANTS:  DePuy sector Gription acetabular component size 56, size 36+4 neutral polyethylene liner, size 5 ACTIS femoral component with high offset, size 36+5 metal hip ball.  SURGEON:  Lind Guest. Ninfa Linden, MD  ASSISTANT: Benita Stabile, PA-C.  ANESTHESIA:  General.  ANTIBIOTICS:  2 g IV Ancef.  ESTIMATED BLOOD LOSS:  150 mL.  COMPLICATIONS:  None.  INDICATIONS:  The patient is an 81 year old gentleman with debilitating arthritis involving his left hip.  He has had a previous right hip replacement in 2010 through a posterior approach.  He hopes to have this hip done today through a direct anterior  approach.  His arthritis is severe and is detrimentally affecting his mobility, his quality of life and his activities of daily living.  He has a flexion contracture of his left knee from a previous left total knee arthroplasty.  We talked in detail  about the risks and benefits of surgery.  Given his comorbidities, he is at higher risk of acute blood loss anemia, nerve or vessel injury, fracture, infection, dislocation, DVT, implant failure, leg length differences and skin and soft tissue issues.   Our goals are to decrease pain, improve mobility and overall improve quality of life.  DESCRIPTION OF PROCEDURE:  After informed consent was obtained, and the appropriate left hip was marked, he was brought to the operating room where general anesthesia was obtained while he was on his stretcher. Traction boots were placed on  both his  feet.  Note, he is significantly shorter on his left leg than his right and we will probably not be able to get him out to full length, but at least a length enough to notice a difference instability.  He was placed supine on the Hana fracture table with  the perineal post in place and both legs in line skeletal traction device and no traction applied. We assessed his left hip radiographically and then prepped the hip with DuraPrep and sterile drapes.  A timeout was called and he was identified as  correct patient, correct left hip.  I then made an incision just inferior and posterior to the anterior superior iliac spine and carried this obliquely down the leg.  We dissected down tensor fascia lata muscle.  Tensor fascia was then divided  longitudinally to proceed with direct anterior approach to the hip.  We identified and cauterized circumflex vessels and then identified the hip capsule and I opened up the hip capsule in an L-type format finding a moderate joint effusion and significant  arthritis around the left hip.  We placed curved retractors around the medial and lateral femoral neck and made a femoral neck cut with oscillating saw just proximal to the lesser trochanter and completed this with an osteotome.  We placed a corkscrew  guide in the femoral head and removed the femoral head in its entirety and found to be completely devoid of cartilage.  I then placed a bent Hohmann over the medial acetabular rim and removed remnants of acetabular labrum and other debris.  I then began  reaming under direct visualization from a size 43 reamer  in a stepwise increments going up to a size 56 reamer with all reamers placed under direct fluoroscopy. The last reamer was placed under direct visualization, so we could obtain our depth of  reaming, our inclination and anteversion.  I then placed real DePuy sector Gription acetabular component, size 56 with a 36+4 polyethylene liner for that size 56  acetabular component.  Attention was then turned to the femur.  With the leg externally  rotated to 120 degrees and adducted,  we were able to place a Mueller retractor medially and Hohman retractor above the greater trochanter. We released lateral joint capsule and used a box cutting osteotome to enter femoral canal and a rongeur to  lateralize. I then began broaching using the ACTIS broaching system from a size 0 going to a size 5.  One of the broaches did come out the posteromedial cortex, but did not cause any issues.  We were able to get past that easily and once we were able to  go with our size 5 ACTIS femoral component, we trialed a high offset femoral neck based off his anatomy and a 36+1.5 hip ball and reduced this in acetabulum.  It was tight but we felt like we could get just a little more leg length out of it.  It was  stable on exam.  We dislocated the hip, removed the trial components.  We then placed the real ACTIS femoral component, size 5 with high offset with a 36+5 hip ball, reduced this in the acetabulum.  We assessed it mechanically and radiographically and we  are pleased with range of motion, offset as well as stability overall.  We did lengthen him some as well.  We then irrigated the soft tissue with normal saline solution.  The joint capsule was closed with interrupted #1 Ethibond suture followed by #1 Vicryl to close the tensor fascia. 0 Vicryl was used to close the deep tissue and 2-0 Vicryl was used to close  the subcutaneous tissue.  The skin was closed with staples.  An Aquacel dressing was applied.  He was taken off the Hana table, awakened, extubated, and taken to recovery room in stable condition with all final counts being correct.  No complications  noted.  Of note, Benita Stabile, PA-C did assist in the  entire case and his assistance was crucial for facilitating all aspects of this case.   MUK D: 10/31/2021 12:23:06 pm T: 11/01/2021 12:29:00 am  JOB: [id number]/ [id number]

## 2021-11-01 NOTE — Progress Notes (Signed)
Subjective: 1 Day Post-Op Procedure(s) (LRB): LEFT TOTAL HIP ARTHROPLASTY ANTERIOR APPROACH (Left) Patient reports pain as moderate.  Awake and alert this am.  Minimal drop in hgb/hct from surgery (minimal acute blood loss anemia).  Vitals stable.  Objective: Vital signs in last 24 hours: Temp:  [97 F (36.1 C)-99.1 F (37.3 C)] 98.1 F (36.7 C) (02/09 2050) Pulse Rate:  [53-90] 63 (02/09 2050) Resp:  [12-23] 16 (02/09 2050) BP: (83-188)/(43-73) 123/45 (02/09 2050) SpO2:  [92 %-100 %] 98 % (02/09 2050) Weight:  [101.2 kg] 101.2 kg (02/09 0711)  Intake/Output from previous day: 02/09 0701 - 02/10 0700 In: 900 [I.V.:800; IV Piggyback:100] Out: 600 [Urine:450; Blood:150] Intake/Output this shift: Total I/O In: -  Out: 450 [Urine:450]  Recent Labs    11/01/21 0323  HGB 10.1*   Recent Labs    11/01/21 0323  WBC 9.6  RBC 3.53*  HCT 30.1*  PLT 202   Recent Labs    11/01/21 0323  NA 133*  K 4.5  CL 102  CO2 25  BUN 19  CREATININE 1.19  GLUCOSE 128*  CALCIUM 8.8*   Recent Labs    10/31/21 0810 11/01/21 0323  INR 1.2 1.2    Sensation intact distally Intact pulses distally Dorsiflexion/Plantar flexion intact Incision: dressing C/D/I   Assessment/Plan: 1 Day Post-Op Procedure(s) (LRB): LEFT TOTAL HIP ARTHROPLASTY ANTERIOR APPROACH (Left) Up with therapy His disposition will depend on what progress he makes with therapy today.     Mcarthur Rossetti 11/01/2021, 6:32 AM

## 2021-11-01 NOTE — Progress Notes (Signed)
Physical Therapy Treatment Patient Details Name: Colton Miranda MRN: [id number] DOB: 1941-03-24 Today's Date: 11/01/2021   History of Present Illness Pt is an 81 y/o male s/p L THA. PMH includes COPD, prostate cancer, a fib, HTN, CVA, DVT, PE, L TKA, and R THA.    PT Comments    Pt received in supine, oriented to self/situation but tangential and noted to have had episode of urinary incontinence prior to session. Pt limited due to pain and slow processing with mobility instructions, emphasis on safe hand placement for transfers, upright posture while seated/standing in RW, pursed-lip breathing techniques, supine/seated LLE AROM per HEP handout, stair sequencing (verbally reviewed) and safe use of RW for pivot transfers. Pt needing up to maxA for bed mobility, sit>stand and step pivot transfer to chair, recommend nursing staff use +2 assist for safety with functional mobility tasks. Chair alarm activated for safety at end of session and spouse in room. Pt continues to benefit from PT services to progress toward functional mobility goals. Anticipate pt will need more than 1 more session for mobility training due to limited progress in AM session.  Recommendations for follow up therapy are one component of a multi-disciplinary discharge planning process, led by the attending physician.  Recommendations may be updated based on patient status, additional functional criteria and insurance authorization.  Follow Up Recommendations  Follow physician's recommendations for discharge plan and follow up therapies (pt spouse adamant about him going home once medically cleared but may not be able to provide much physical/lift assist.)     Assistance Recommended at Discharge Frequent or constant Supervision/Assistance  Patient can return home with the following A little help with walking and/or transfers;A little help with bathing/dressing/bathroom   Equipment Recommendations  None recommended by PT     Recommendations for Other Services       Precautions / Restrictions Precautions Precautions: Fall Precaution Comments: limited L shld ROM, premedicate Restrictions Weight Bearing Restrictions: Yes LLE Weight Bearing: Weight bearing as tolerated     Mobility  Bed Mobility Overal bed mobility: Needs Assistance Bed Mobility: Supine to Sit, Rolling Rolling: Max assist   Supine to sit: HOB elevated, Max assist     General bed mobility comments: Assist for trunk and LE assist. Increased time required, pt with difficulty sequencing safely needed heavy lift assist with bed pad to reach opposite bed rail and for hip scooting to EOB.    Transfers Overall transfer level: Needs assistance Equipment used: Rolling walker (2 wheels) Transfers: Sit to/from Stand, Bed to chair/wheelchair/BSC Sit to Stand: Mod assist, From elevated surface   Step pivot transfers: Mod assist, Max assist, From elevated surface       General transfer comment: from EOB to RW, posterior lean upon rising and difficulty WB through LLE. pivotal steps to recliner on Rt side with increased time/max cues and gait belt assist due to R/posterior lean and very flexed trunk.    Ambulation/Gait    General Gait Details: pt unable, pain in LLE and fatigue and poor posture limiting safety with task.      Balance Overall balance assessment: Needs assistance Sitting-balance support: Bilateral upper extremity supported, Feet supported Sitting balance-Leahy Scale: Fair   Postural control: Right lateral lean, Posterior lean Standing balance support: Bilateral upper extremity supported, Reliant on assistive device for balance Standing balance-Leahy Scale: Poor Standing balance comment: RW support and mod to maxA for steps to recliner, R lean and posterior bias  Cognition Arousal/Alertness: Awake/alert Behavior During Therapy: WFL for tasks assessed/performed Overall Cognitive  Status: Within Functional Limits for tasks assessed         Exercises Other Exercises Other Exercises: supine LLE A/AAROM: ankle pumps, quad sets, heel slides (AA), hip abduction (AA), SAQ x10 reps ea    General Comments General comments (skin integrity, edema, etc.): pt had episode urinary incontinence prior to session but seemingly unware/did not alert PTA prior to getting OOB. slow processing, encouraged him to notify staff if any hygiene needs; NT in room to give him a bath after session.      Pertinent Vitals/Pain Pain Assessment Pain Assessment: 0-10 Pain Score: 8  Pain Location: L hip Pain Descriptors / Indicators: Guarding, Grimacing     PT Goals (current goals can now be found in the care plan section) Acute Rehab PT Goals Patient Stated Goal: to go home PT Goal Formulation: With patient Time For Goal Achievement: 11/14/21 Progress towards PT goals: Progressing toward goals (slow progress due to pain)    Frequency    7X/week      PT Plan Current plan remains appropriate       AM-PAC PT "6 Clicks" Mobility   Outcome Measure  Help needed turning from your back to your side while in a flat bed without using bedrails?: A Lot Help needed moving from lying on your back to sitting on the side of a flat bed without using bedrails?: A Lot Help needed moving to and from a bed to a chair (including a wheelchair)?: A Lot Help needed standing up from a chair using your arms (e.g., wheelchair or bedside chair)?: A Lot Help needed to walk in hospital room?: Total Help needed climbing 3-5 steps with a railing? : Total 6 Click Score: 10    End of Session Equipment Utilized During Treatment: Gait belt Activity Tolerance: Patient tolerated treatment well;Patient limited by fatigue;Patient limited by pain Patient left: with call bell/phone within reach;with family/visitor present;in chair;with chair alarm set;with nursing/sitter in room;Other (comment) (NT in room to give  him a bath) Nurse Communication: Mobility status;Precautions;Other (comment) (needs +2 for transfer safety) PT Visit Diagnosis: Other abnormalities of gait and mobility (R26.89);Pain Pain - Right/Left: Left Pain - part of body: Hip     Time: 9458-5929 PT Time Calculation (min) (ACUTE ONLY): 37 min  Charges:  $Therapeutic Exercise: 8-22 mins $Therapeutic Activity: 8-22 mins                     Avenir Lozinski P., PTA Acute Rehabilitation Services Pager: [phone] Office: [place] 11/01/2021, 10:28 AM

## 2021-11-01 NOTE — Discharge Instructions (Signed)

## 2021-11-01 NOTE — Progress Notes (Signed)
ANTICOAGULATION CONSULT NOTE - Follow Up Consult  Pharmacy Consult for Warfarin Indication:  Atrial Fibrillation, Hx VTE, Hypercoaguable State (per PCP encounter documentation)  No Known Allergies  Patient Measurements: Height: 5\' 6"  (167.6 cm) Weight: 101.2 kg (223 lb) IBW/kg (Calculated) : 63.8   Vital Signs: Temp: 98.7 F (37.1 C) (02/10 0835) Temp Source: Oral (02/10 0835) BP: 104/60 (02/10 0835) Pulse Rate: 80 (02/10 0835)  Labs: Recent Labs    10/31/21 0810 11/01/21 0323  HGB  --  10.1*  HCT  --  30.1*  PLT  --  202  APTT 36  --   LABPROT 15.3* 15.1  INR 1.2 1.2  CREATININE  --  1.19     Estimated Creatinine Clearance: 55.2 mL/min (by C-G formula based on SCr of 1.19 mg/dL).  Assessment: History: 81 yr old man with prostate cancer, S/P L total hip arthroplasty today. Pt was on warfarin PTA for atrial fibrillation, hx DVT/PE, hypercoagulable state, protein C/S deficiency. Pt was taking warfarin 17.5 mg on Mondays and 15 mg on other days of the week.   2/9-  Per pt's wife, he was bridged with Lovenox before the procedure (last dose yesterday- 10/30/2021); per Dr Ninfa Linden this afternoon, pt to resume warfarin this evening (2/9) with no bridging. Patient was provided with 17.5 mg at 18:11  2/10- H/H 10.1/30.1, plt 202; INR today 1.2 (03:23). Per RN, no bleeding issues observed post op. Pt on thin diet. No drug-drug interaction issues at this time.  Goal of Therapy:  INR 2-3 Monitor platelets by anticoagulation protocol: Yes   Plan:  Warfarin 15 mg X 1 this evening Monitor daily INR, CBC Monitor for bleeding  Volanda Mangine BS, PharmD, BCPS Clinical Pharmacist 11/01/2021,9:35 AM

## 2021-11-01 NOTE — Anesthesia Postprocedure Evaluation (Signed)
Anesthesia Post Note  Patient: Colton Miranda  Procedure(s) Performed: LEFT TOTAL HIP ARTHROPLASTY ANTERIOR APPROACH (Left: Hip)     Patient location during evaluation: PACU Anesthesia Type: General Level of consciousness: awake and alert Pain management: pain level controlled Vital Signs Assessment: post-procedure vital signs reviewed and stable Respiratory status: spontaneous breathing, nonlabored ventilation, respiratory function stable and patient connected to nasal cannula oxygen Cardiovascular status: blood pressure returned to baseline and stable Postop Assessment: no apparent nausea or vomiting Anesthetic complications: no   No notable events documented.  Last Vitals:  Vitals:   10/31/21 2050 11/01/21 0835  BP: (!) 123/45 104/60  Pulse: 63 80  Resp: 16 19  Temp: 36.7 C 37.1 C  SpO2: 98% 96%    Last Pain:  Vitals:   11/01/21 1000  TempSrc:   PainSc: [place]

## 2021-11-01 NOTE — Evaluation (Signed)
Occupational Therapy Evaluation Patient Details Name: Colton Miranda MRN: [id number] DOB: June 26, 1941 Today's Date: 11/01/2021   History of Present Illness Pt is an 81 y/o male s/p L THA. PMH includes COPD, prostate cancer, a fib, HTN, CVA, DVT, PE, L TKA, and R THA.   Clinical Impression   Pt reports walking with a walker and functioning modified independently in ADL with a tub bench and use of a sock aid. He states he took an extended amount of time for LB ADLs. Pt in bed upon arrival, playing solitaire on his phone, but willing to demonstrate mobility and ADLs for assessment. Pt needs max assist to EOB, mod back to supine. He stood from elevated bed with moderate assistance and cues for technique. He requires set up to total assist for ADLs. Declined OOB to chair as he had been up earlier and reports the chair hurts his back. Pt states he had a bad experience in rehab at a SNF and plans to go home.      Recommendations for follow up therapy are one component of a multi-disciplinary discharge planning process, led by the attending physician.  Recommendations may be updated based on patient status, additional functional criteria and insurance authorization.   Follow Up Recommendations  Follow physician's recommendations for discharge plan and follow up therapies    Assistance Recommended at Discharge Frequent or constant Supervision/Assistance  Patient can return home with the following A lot of help with walking and/or transfers;A lot of help with bathing/dressing/bathroom;Assistance with cooking/housework;Assist for transportation;Help with stairs or ramp for entrance    Functional Status Assessment  Patient has had a recent decline in their functional status and/or demonstrates limited ability to make significant improvements in function in a reasonable and predictable amount of time  Equipment Recommendations  None recommended by OT    Recommendations for Other Services        Precautions / Restrictions Precautions Precautions: Fall Precaution Comments: limited L shld ROM, premedicate Restrictions Weight Bearing Restrictions: Yes LLE Weight Bearing: Weight bearing as tolerated      Mobility Bed Mobility Overal bed mobility: Needs Assistance Bed Mobility: Supine to Sit, Sit to Supine     Supine to sit: Max assist Sit to supine: Mod assist        Transfers Overall transfer level: Needs assistance Equipment used: Rolling walker (2 wheels) Transfers: Sit to/from Stand Sit to Stand: Mod assist, From elevated surface                  Balance Overall balance assessment: Needs assistance Sitting-balance support: Single extremity supported Sitting balance-Leahy Scale: Fair     Standing balance support: Bilateral upper extremity supported, Reliant on assistive device for balance Standing balance-Leahy Scale: Poor                             ADL either performed or assessed with clinical judgement   ADL Overall ADL's : Needs assistance/impaired Eating/Feeding: Independent;Bed level;Sitting   Grooming: Set up;Sitting;Wash/dry hands;Wash/dry face;Oral care   Upper Body Bathing: Minimal assistance;Sitting   Lower Body Bathing: Total assistance;Sit to/from stand   Upper Body Dressing : Minimal assistance;Sitting   Lower Body Dressing: Total assistance;Sit to/from stand   Toilet Transfer: Maximal assistance;Stand-pivot;BSC/3in1;Rolling walker (2 wheels)   Toileting- Clothing Manipulation and Hygiene: Total assistance;Sit to/from stand               Vision Baseline Vision/History: 1 Wears glasses Ability to See  in Adequate Light: 0 Adequate       Perception     Praxis      Pertinent Vitals/Pain Pain Assessment Pain Assessment: Faces Faces Pain Scale: Hurts even more Pain Location: L hip Pain Descriptors / Indicators: Guarding, Grimacing Pain Intervention(s): Monitored during session, Premedicated before  session, Repositioned, Ice applied     Hand Dominance Right   Extremity/Trunk Assessment Upper Extremity Assessment Upper Extremity Assessment: LUE deficits/detail LUE Deficits / Details: long standing shoulder limitations LUE Coordination: decreased gross motor   Lower Extremity Assessment Lower Extremity Assessment: Defer to PT evaluation   Cervical / Trunk Assessment Cervical / Trunk Assessment: Kyphotic (with forward head)   Communication Communication Communication: No difficulties   Cognition Arousal/Alertness: Awake/alert Behavior During Therapy: WFL for tasks assessed/performed Overall Cognitive Status: Within Functional Limits for tasks assessed                                       General Comments       Exercises     Shoulder Instructions      Home Living Family/patient expects to be discharged to:: Private residence Living Arrangements: Spouse/significant other Available Help at Discharge: Family;Available 24 hours/day Type of Home: Apartment Home Access: Stairs to enter Entrance Stairs-Number of Steps: 1 Entrance Stairs-Rails: None Home Layout: One level     Bathroom Shower/Tub: Teacher, early years/pre: Standard     Home Equipment: Conservation officer, nature (2 wheels);BSC/3in1;Tub bench;Grab bars - tub/shower;Hand held shower head;Adaptive equipment Adaptive Equipment: Reacher;Sock aid        Prior Functioning/Environment Prior Level of Function : Needs assist             Mobility Comments: Used RW for ambulation ADLs Comments: report he could put on his shoes and socks with increased time, uses sock aid        OT Problem List: Decreased strength;Decreased activity tolerance;Impaired balance (sitting and/or standing);Decreased knowledge of use of DME or AE;Pain      OT Treatment/Interventions: Self-care/ADL training;DME and/or AE instruction;Therapeutic activities;Patient/family education;Balance training    OT  Goals(Current goals can be found in the care plan section) Acute Rehab OT Goals OT Goal Formulation: With patient Time For Goal Achievement: 11/15/21 Potential to Achieve Goals: Good  OT Frequency: Min 3X/week    Co-evaluation              AM-PAC OT "6 Clicks" Daily Activity     Outcome Measure Help from another person eating meals?: None Help from another person taking care of personal grooming?: A Little Help from another person toileting, which includes using toliet, bedpan, or urinal?: Total Help from another person bathing (including washing, rinsing, drying)?: A Lot Help from another person to put on and taking off regular upper body clothing?: A Little Help from another person to put on and taking off regular lower body clothing?: Total 6 Click Score: 14   End of Session Equipment Utilized During Treatment: Gait belt;Rolling walker (2 wheels)  Activity Tolerance: Patient tolerated treatment well Patient left: in bed;with call bell/phone within reach;with bed alarm set  OT Visit Diagnosis: Unsteadiness on feet (R26.81);Other abnormalities of gait and mobility (R26.89);Pain;Muscle weakness (generalized) (M62.81) Pain - Right/Left: Left Pain - part of body: Hip                Time: 4098-1191 OT Time Calculation (min): 19 min Charges:  OT General  Charges $OT Visit: 1 Visit OT Evaluation $OT Eval Moderate Complexity: 1 Mod Nestor Lewandowsky, OTR/L Acute Rehabilitation Services Pager: [phone] Office: [phone]  Malka So 11/01/2021, 3:17 PM

## 2021-11-01 NOTE — Progress Notes (Signed)
Patient ID: Colton Miranda, male   DOB: Feb 04, 1941, 81 y.o.   MRN: [id number] The patient does not make enough progress with his mobility today to be safely discharged home.  His vital signs remained stable and he is comfortable in bed right now.  He does have chronic back issues.  His left operative hip is stable.  His left hip dressing is clean and dry.  Hopefully he will be able to be discharged to home tomorrow once he clears therapy.

## 2021-11-01 NOTE — Progress Notes (Signed)
Physical Therapy Treatment Patient Details Name: Colton Miranda MRN: [id number] DOB: 01-Aug-1941 Today's Date: 11/01/2021   History of Present Illness Pt is an 81 y/o male s/p L THA. PMH includes COPD, prostate cancer, a fib, HTN, CVA, DVT, PE, L TKA, and R THA.    PT Comments    Pt received in chair, eager to return to bed due to c/o increased back pain, spouse present in room and encouraging. Pt making progress this afternoon after premedication and ice applied to L hip, able to stand more upright at RW and to initiate short gait trial at bedside. Emphasis on self-assist using strap for bed mobility and LLE therapeutic exercises, use of RW and gait progression with chair follow for safety and benefits of continued mobility. Pt needing up to modA +2 for functional mobility tasks. Pt continues to benefit from PT services to progress toward functional mobility goals.   Recommendations for follow up therapy are one component of a multi-disciplinary discharge planning process, led by the attending physician.  Recommendations may be updated based on patient status, additional functional criteria and insurance authorization.  Follow Up Recommendations  Follow physician's recommendations for discharge plan and follow up therapies (pt spouse adamant about him going home once medically cleared but may not be able to provide much physical/lift assist.)     Assistance Recommended at Discharge Frequent or constant Supervision/Assistance  Patient can return home with the following A little help with walking and/or transfers;A little help with bathing/dressing/bathroom   Equipment Recommendations  None recommended by PT    Recommendations for Other Services       Precautions / Restrictions Precautions Precautions: Fall Precaution Comments: limited L shld ROM, premedicate Restrictions Weight Bearing Restrictions: Yes LLE Weight Bearing: Weight bearing as tolerated     Mobility  Bed  Mobility Overal bed mobility: Needs Assistance Bed Mobility: Sit to Supine Rolling: Max assist   Supine to sit: HOB elevated, Max assist Sit to supine: +2 for physical assistance, Min assist   General bed mobility comments: use of strap to assist with lifting/rotating LLE to return to supine from EOB, trunk/BLE assist for comfort, increased time to perform    Transfers Overall transfer level: Needs assistance Equipment used: Rolling walker (2 wheels) Transfers: Sit to/from Stand, Bed to chair/wheelchair/BSC Sit to Stand: Mod assist, +2 safety/equipment         General transfer comment: cues for use of momentum strategy, modA lift assist needed from recliner, increased time to place BUE on RW    Ambulation/Gait Ambulation/Gait assistance: Min assist, +2 physical assistance, +2 safety/equipment Gait Distance (Feet): 12 Feet Assistive device: Rolling walker (2 wheels) Gait Pattern/deviations: Step-to pattern, Decreased weight shift to left, Trunk flexed Gait velocity: <0.1 m/s     General Gait Details: lowered RW height slightly in anticipation of gait and max cues for upright posture and foot placement/ sequencing, able to progress gait with chair follow for safety, pt with consistent downward gaze despite postural cues. R lean slightly improved from AM session.        Balance Overall balance assessment: Needs assistance Sitting-balance support: Bilateral upper extremity supported, Feet supported Sitting balance-Leahy Scale: Fair   Postural control: Right lateral lean, Posterior lean Standing balance support: Bilateral upper extremity supported, Reliant on assistive device for balance Standing balance-Leahy Scale: Poor Standing balance comment: RW support and min to modA for safety/RW mgmt           Cognition Arousal/Alertness: Awake/alert Behavior During Therapy: [facility] for  tasks assessed/performed Overall Cognitive Status: Within Functional Limits for tasks assessed       General Comments: cooperative, tangential, slow processing at times.        Exercises Other Exercises Other Exercises: supine LLE A/AAROM: ankle pumps, quad sets, heel slides (AA using strap), hip abduction (AA)x 10 reps; Seated LLE AROM: LAQ x10 reps ea    General Comments        Pertinent Vitals/Pain Pain Assessment Pain Assessment: 0-10 Pain Score: 8  Pain Location: L hip Pain Descriptors / Indicators: Guarding, Grimacing Pain Intervention(s): Limited activity within patient's tolerance, Monitored during session, Premedicated before session, Repositioned, Ice applied    Home Living Family/patient expects to be discharged to:: Private residence Living Arrangements: Spouse/significant other Available Help at Discharge: Family;Available 24 hours/day Type of Home: Apartment Home Access: Stairs to enter Entrance Stairs-Rails: None Entrance Stairs-Number of Steps: 1   Home Layout: One level Home Equipment: Rolling Walker (2 wheels);BSC/3in1;Tub bench;Grab bars - tub/shower;Hand held shower head;Adaptive equipment      Prior Function            PT Goals (current goals can now be found in the care plan section) Acute Rehab PT Goals Patient Stated Goal: to go home, decreased low back and L hip pain PT Goal Formulation: With patient Time For Goal Achievement: 11/14/21 Progress towards PT goals: Progressing toward goals    Frequency    7X/week      PT Plan Current plan remains appropriate       AM-PAC PT "6 Clicks" Mobility   Outcome Measure  Help needed turning from your back to your side while in a flat bed without using bedrails?: A Lot Help needed moving from lying on your back to sitting on the side of a flat bed without using bedrails?: A Lot Help needed moving to and from a bed to a chair (including a wheelchair)?: A Lot Help needed standing up from a chair using your arms (e.g., wheelchair or bedside chair)?: A Lot Help needed to walk in hospital  room?: A Lot Help needed climbing 3-5 steps with a railing? : Total 6 Click Score: 11    End of Session Equipment Utilized During Treatment: Gait belt Activity Tolerance: Patient tolerated treatment well;Patient limited by fatigue;Patient limited by pain Patient left: with call bell/phone within reach;with family/visitor present;in chair;with chair alarm set;with nursing/sitter in room;Other (comment) (NT in room to give him a bath) Nurse Communication: Mobility status;Precautions;Other (comment) (needs +2 for transfer safety with nsg staff) PT Visit Diagnosis: Other abnormalities of gait and mobility (R26.89);Pain Pain - Right/Left: Left Pain - part of body: Hip     Time: 1345-1415 PT Time Calculation (min) (ACUTE ONLY): 30 min  Charges:  $Gait Training: 8-22 mins $Therapeutic Exercise: 8-22 mins                     Sianna Garofano P., PTA Acute Rehabilitation Services Pager: [phone] Office: [place] 11/01/2021, 3:47 PM

## 2021-11-02 DIAGNOSIS — M1612 Unilateral primary osteoarthritis, left hip: Secondary | ICD-10-CM | POA: Diagnosis not present

## 2021-11-02 LAB — CBC
HCT: 31.1 % — ABNORMAL LOW (ref 39.0–52.0)
Hemoglobin: 10.1 g/dL — ABNORMAL LOW (ref 13.0–17.0)
MCH: 28.3 pg (ref 26.0–34.0)
MCHC: 32.5 g/dL (ref 30.0–36.0)
MCV: 87.1 fL (ref 80.0–100.0)
Platelets: UNDETERMINED 10*3/uL (ref 150–400)
RBC: 3.57 MIL/uL — ABNORMAL LOW (ref 4.22–5.81)
RDW: 14.6 % (ref 11.5–15.5)
WBC: 8.9 10*3/uL (ref 4.0–10.5)
nRBC: 0 % (ref 0.0–0.2)

## 2021-11-02 LAB — PROTIME-INR
INR: 1.5 — ABNORMAL HIGH (ref 0.8–1.2)
Prothrombin Time: 18.4 seconds — ABNORMAL HIGH (ref 11.4–15.2)

## 2021-11-02 MED ORDER — WARFARIN SODIUM 7.5 MG PO TABS
15.0000 mg | ORAL_TABLET | Freq: Once | ORAL | Status: AC
  Administered 2021-11-02: 15 mg via ORAL
  Filled 2021-11-02: qty 2

## 2021-11-02 MED ORDER — SODIUM CHLORIDE 0.9 % IV BOLUS
1000.0000 mL | Freq: Once | INTRAVENOUS | Status: AC
  Administered 2021-11-02: 1000 mL via INTRAVENOUS

## 2021-11-02 NOTE — TOC Initial Note (Signed)
Transition of Care [facility]) - Initial/Assessment Note    Patient Details  Name: Colton Miranda MRN: [id number] Date of Birth: Jun 18, 1941  Transition of Care [facility]) CM/SW Contact:    Bartholomew Crews, RN Phone Number: [phone] 11/02/2021, 1:37 PM  Clinical Narrative:                  Spoke with patient at the bedside to discuss post acute transition. Patient from home with his wife. He stated that they live in an apartment. He has a walker and a cane at home. He says that he needs to be able to step up on the curb when he discharges. Patient stated that he was told that he will be inpatient another day or two in order to work with therapy. Patient stated that until the last couple of weeks he was independent with adls and Iadls. His wife will provide transportation home.   Noted outpatient NCM sent referral for [facility] PT to [facility] yesterday. Notified liaison at [facility] that patient location.   TOC following for transition needs.   Expected Discharge Plan: [place] Barriers to Discharge: Continued Medical Work up   Patient Goals and CMS Choice Patient states their goals for this hospitalization and ongoing recovery are:: return home with wife CMS Medicare.gov Compare Post Acute Care list provided to:: Patient Choice offered to / list presented to : Patient  Expected Discharge Plan and Services Expected Discharge Plan: [place]   Discharge Planning Services: CM Consult Post Acute Care Choice: [place] arrangements for the past 2 months: Apartment Expected Discharge Date: 11/02/21               DME Arranged:  (Patient has RW already; elevated toilets in the home)         HH Arranged: PT HH Agency: [place] Date [place]: 11/02/21 Time HH Agency Contacted: 26 Representative spoke with at [place]: Alwyn Ren  Prior Living Arrangements/Services Living arrangements for the past 2 months: [place]  with:: Self, Spouse Patient language and need for interpreter reviewed:: Yes Do you feel safe going back to the place where you live?: Yes      Need for Family Participation in Patient Care: No (Comment)   Current home services: DME (walker, cane)    Activities of Daily Living Home Assistive Devices/Equipment: Environmental consultant (specify type) ADL Screening (condition at time of admission) Patient's cognitive ability adequate to safely complete daily activities?: Yes Is the patient deaf or have difficulty hearing?: Yes Does the patient have difficulty seeing, even when wearing glasses/contacts?: No Does the patient have difficulty concentrating, remembering, or making decisions?: No Patient able to express need for assistance with ADLs?: Yes Does the patient have difficulty dressing or bathing?: No Independently performs ADLs?: Yes (appropriate for developmental age) Does the patient have difficulty walking or climbing stairs?: Yes Weakness of Legs: Left Weakness of Arms/Hands: None  Permission Sought/Granted                  Emotional Assessment Appearance:: Appears stated age Attitude/Demeanor/Rapport: Engaged Affect (typically observed): Accepting Orientation: : Oriented to Self, Oriented to Place, Oriented to  Time, Oriented to Situation Alcohol / Substance Use: Not Applicable Psych Involvement: No (comment)  Admission diagnosis:  Status post total replacement of left hip [Z96.642] Patient Active Problem List   Diagnosis Date Noted   Status post total replacement of left hip 10/31/2021   Unilateral primary osteoarthritis, left  hip 07/29/2021   Anemia 02/14/2020   Hyponatremia 02/14/2020   S/P total knee arthroplasty, left 02/14/2020   Osteoarthritis of left knee 02/10/2020   Contact dermatitis 01/19/2020   PAF (paroxysmal atrial fibrillation) ([place]) 04/14/2018   Malignant neoplasm of prostate ([place]) 10/07/2017   Colon polyps 11/26/2016   Influenza vaccination declined  05/28/2016   History of fall 07/25/2015   Palpitations 11/08/2014   Pain of left arm 11/08/2014   Pain in joint, lower leg 02/28/2014   Essential hypertension 02/28/2014   Disturbance of skin sensation 02/23/2013   Insomnia, unspecified 02/23/2013   Obesity    DM (diabetes mellitus), secondary, uncontrolled, with peripheral vascular complications    Hyperlipemia    Urinary frequency    Primary hypercoagulable state ([place]) 01/03/2013   Other pulmonary embolism and infarction 01/03/2013   Long term current use of anticoagulant therapy 01/03/2013   PCP:  Ginger Organ., MD Pharmacy:   [facility], [place] [address] Phone: [phone] Fax: [phone]     Social Determinants of Health (SDOH) Interventions    Readmission Risk Interventions No flowsheet data found.

## 2021-11-02 NOTE — Progress Notes (Signed)
Pt. Staying another night. Pt. Had D/C orders that were based on pt. BP and PT results. PT. Did not meet PT goals. Will stay another night. MD notified and charge notified.

## 2021-11-02 NOTE — Progress Notes (Signed)
ANTICOAGULATION CONSULT NOTE - Follow Up Consult  Pharmacy Consult for Warfarin Indication:  Atrial Fibrillation, Hx VTE, Hypercoaguable State (per PCP encounter documentation)  No Known Allergies  Patient Measurements: Height: 5\' 6"  (167.6 cm) Weight: 101.2 kg (223 lb) IBW/kg (Calculated) : 63.8   Vital Signs: Temp: 98.3 F (36.8 C) (02/10 2121) Temp Source: Oral (02/10 2121) BP: 129/71 (02/11 0500) Pulse Rate: 60 (02/11 0500)  Labs: Recent Labs    10/31/21 0810 11/01/21 0323 11/02/21 0123  HGB  --  10.1* 10.1*  HCT  --  30.1* 31.1*  PLT  --  202 PLATELET CLUMPS NOTED ON SMEAR, UNABLE TO ESTIMATE  APTT 36  --   --   LABPROT 15.3* 15.1 18.4*  INR 1.2 1.2 1.5*  CREATININE  --  1.19  --      Estimated Creatinine Clearance: 55.2 mL/min (by C-G formula based on SCr of 1.19 mg/dL).  Assessment: History: 81 yr old man with prostate cancer, S/P L total hip arthroplasty today. Pt was on warfarin PTA for atrial fibrillation, hx DVT/PE, hypercoagulable state, protein C/S deficiency. Pt was taking warfarin 17.5 mg on Mondays and 15 mg on other days of the week.   2/9-  Per pt's wife, he was bridged with Lovenox before the procedure (last dose yesterday- 10/30/2021); per Dr Ninfa Linden this afternoon, pt to resume warfarin this evening (2/9) with no bridging. Patient was provided with 17.5 mg at 18:11  2/10- H/H 10.1/30.1, plt 202; INR today 1.2 (03:23). Per RN, no bleeding issues observed post op. Pt on thin diet. No drug-drug interaction issues at this time. Provider ordered a onetime Lovenox 100 mg [place] injection.  2/11- H/H 10.1/31.1, PLT clumps on smear- lab unable to estimate, INR 1.5. Per RN, no bleeding.   Goal of Therapy:  INR 2-3 Monitor platelets by anticoagulation protocol: Yes   Plan:  Warfarin 15 mg X 1 this evening Monitor daily INR, CBC Monitor for bleeding  Margurete Guaman BS, PharmD, BCPS Clinical Pharmacist 11/02/2021,7:55 AM

## 2021-11-02 NOTE — Progress Notes (Signed)
Physical Therapy Treatment Patient Details Name: Colton Miranda MRN: [id number] DOB: 12/07/1940 Today's Date: 11/02/2021   History of Present Illness Pt is an 81 y/o male s/p L THA. PMH includes COPD, prostate cancer, a fib, HTN, CVA, DVT, PE, L TKA, and R THA.    PT Comments    Pt received in supine, pt bed noted to be soaked in urine due to incontinence, pt aware but states he did not alert staff, RN notified. Pt c/o poor sleep and needing increased time/heavy cues for bed mobility, transfers and short distance gait task at bedside. Pt needing up to mod assist (+2 physical assist) for bed mobility and not yet able to perform household distance gait trial due to pain/fatigue. Pt will need to perform curb step and progress gait distances prior to DC home. Plan to assess pt perform longer gait trial/step in PM session to assess if he is ready for DC. Pt continues to benefit from PT services to progress toward functional mobility goals.   Recommendations for follow up therapy are one component of a multi-disciplinary discharge planning process, led by the attending physician.  Recommendations may be updated based on patient status, additional functional criteria and insurance authorization.  Follow Up Recommendations  Follow physician's recommendations for discharge plan and follow up therapies (pt spouse adamant about him going home once medically cleared but may not be able to provide much physical/lift assist.)     Assistance Recommended at Discharge Frequent or constant Supervision/Assistance  Patient can return home with the following A little help with bathing/dressing/bathroom;Help with stairs or ramp for entrance;A lot of help with walking and/or transfers   Equipment Recommendations  None recommended by PT    Recommendations for Other Services       Precautions / Restrictions Precautions Precautions: Fall Precaution Comments: limited L shld ROM,  premedicate Restrictions Weight Bearing Restrictions: Yes LLE Weight Bearing: Weight bearing as tolerated     Mobility  Bed Mobility Overal bed mobility: Needs Assistance Bed Mobility: Supine to Sit     Supine to sit: Mod assist, HOB elevated     General bed mobility comments: use of strap to assist with lifting/rotating LLE to EOB, needs trunk assist to achieve upright and for balance once seated, increased time to perform and heavy cues    Transfers Overall transfer level: Needs assistance Equipment used: Rolling walker (2 wheels) Transfers: Sit to/from Stand Sit to Stand: Mod assist, From elevated surface, +2 physical assistance           General transfer comment: cues for use of momentum strategy, modA lift assist needed from elevated bed, increased time to place BUE on RW    Ambulation/Gait Ambulation/Gait assistance: Min assist, +2 safety/equipment Gait Distance (Feet): 8 Feet Assistive device: Rolling walker (2 wheels) Gait Pattern/deviations: Step-to pattern, Decreased weight shift to left, Trunk flexed       General Gait Details: max cues for upright posture and foot placement/ sequencing, R lean and mod cues for forward gaze; pain/fatigue limiting gait distance.   Stairs             Wheelchair Mobility    Modified Rankin (Stroke Patients Only)       Balance Overall balance assessment: Needs assistance Sitting-balance support: Single extremity supported Sitting balance-Leahy Scale: Fair     Standing balance support: Bilateral upper extremity supported, Reliant on assistive device for balance Standing balance-Leahy Scale: Poor Standing balance comment: RW support and minA for safety/RW mgmt  Cognition Arousal/Alertness:  (drowsy but participatory) Behavior During Therapy: WFL for tasks assessed/performed Overall Cognitive Status: Within Functional Limits for tasks assessed                                  General Comments: pt noted to be lying in bed saturated by urine from shoulders to B knees, he states he was aware but did not notify staff; NT and RN notified. Slow processing and needs encouragement to initiate tasks and needs simple 1-step cues        Exercises Other Exercises Other Exercises: seated LLE A/AAROM: LAQ (needs AA to achieve near knee extension, only able to perform ~50% of motion unassisted) x10 reps Other Exercises: supine LLE AROM: ankle pumps, heel slides (AA), hip aB/aDduction x5-10 reps ea Other Exercises: STS and static standing for BLE strengthening Other Exercises: portion of gait billed as TE for BLE strengthening    General Comments        Pertinent Vitals/Pain Pain Assessment Pain Assessment: Faces Faces Pain Scale: Hurts even more Pain Location: L hip Pain Descriptors / Indicators: Guarding, Grimacing Pain Intervention(s): Limited activity within patient's tolerance, Monitored during session, Premedicated before session, Repositioned, Ice applied    Home Living                          Prior Function            PT Goals (current goals can now be found in the care plan section) Acute Rehab PT Goals Patient Stated Goal: to go home, decreased low back and L hip pain PT Goal Formulation: With patient Time For Goal Achievement: 11/14/21 Progress towards PT goals: Progressing toward goals (slow progress)    Frequency    7X/week      PT Plan Current plan remains appropriate    Co-evaluation              AM-PAC PT "6 Clicks" Mobility   Outcome Measure  Help needed turning from your back to your side while in a flat bed without using bedrails?: A Lot Help needed moving from lying on your back to sitting on the side of a flat bed without using bedrails?: A Lot Help needed moving to and from a bed to a chair (including a wheelchair)?: A Lot Help needed standing up from a chair using your arms (e.g.,  wheelchair or bedside chair)?: A Lot Help needed to walk in hospital room?: A Lot Help needed climbing 3-5 steps with a railing? : Total 6 Click Score: 11    End of Session Equipment Utilized During Treatment: Gait belt Activity Tolerance: Patient limited by fatigue;Patient limited by pain Patient left: in chair;with call bell/phone within reach;with chair alarm set;Other (comment) (NT planning to come into room to assist pt with bathing) Nurse Communication: Mobility status;Precautions;Other (comment) (Pt with signfiicant urinary incontinence, did not alert staff overnight or in AM.) PT Visit Diagnosis: Other abnormalities of gait and mobility (R26.89);Pain Pain - Right/Left: Left Pain - part of body: Hip     Time: 2458-0998 PT Time Calculation (min) (ACUTE ONLY): 22 min  Charges:  $Therapeutic Exercise: 8-22 mins                     Colton Bogard P., PTA Acute Rehabilitation Services Pager: [phone] Office: [phone]    Carlene Coria 11/02/2021, 12:47 PM

## 2021-11-02 NOTE — Discharge Summary (Signed)
Patient ID: Colton Miranda MRN: [id number] DOB/AGE: 81-Dec-1942 81 y.o.  Admit date: 10/31/2021 Discharge date: 11/02/2021  Admission Diagnoses:  Principal Problem:   Unilateral primary osteoarthritis, left hip Active Problems:   Status post total replacement of left hip   Discharge Diagnoses:  Same  Past Medical History:  Diagnosis Date   BPH associated with nocturia    Chronic constipation    COPD (chronic obstructive pulmonary disease) (HCC)    Depression    Eczema    Heart murmur    at birth   History of adenomatous polyp of colon    History of basal cell carcinoma (BCC) of skin    post excision from trunk   History of CVA (cerebrovascular accident) without residual deficits    11-06-2017 per pt and wife "stroke was approx. 20 years ago , 1999, caused by blood clot after knee scope"  DVT to PE   History of depression    over 40 years ago nervous breakdown   History of DVT (deep vein thrombosis)    11-06-2017 per pt and wife happened lower leg approx. 1999 after knee scope   History of pulmonary embolus (PE)    11-06-2017 from DVT in approx. 1999 per wife and pt   HOH (hard of hearing)    Hyperlipidemia    Hypertension    followed by pcp   Long term (current) use of anticoagulants Coumadin-- [facility] Pharmasist w/ [facility]   secondary to primary hypercoagulopathy w/ hx DVT and PE   Myalgia and myositis, unspecified    OA (osteoarthritis)    "all joints"   PAF (paroxysmal atrial fibrillation) ([place]) 2019   Peripheral neuropathy    Pinched nerve in neck    per pt causes intermittant numbness bilateral arms and hands   Primary hypercoagulable state ([place])    Prostate cancer [facility]) urologist-  dr Alyson Ingles  oncologist-- dr Tammi Klippel   dx 08-28-2017  Stage T1c, Gleason 4+4, PSA 14.1, vol 42.8cc-- planned treatment external radiation and ADT   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 male who was admitted 10/31/2021 for operative treatment ofUnilateral primary osteoarthritis, left hip. Patient has severe unremitting pain that affects sleep, daily activities, and work/hobbies. After pre-op clearance the patient was taken to the operating room on 10/31/2021 and underwent  Procedure(s): LEFT TOTAL HIP ARTHROPLASTY ANTERIOR APPROACH.    Patient was given perioperative antibiotics:  Anti-infectives (From admission, onward)    Start     Dose/Rate Route Frequency Ordered Stop   10/31/21 1700  ceFAZolin (ANCEF) IVPB 1 g/50 mL premix        1 g 100 mL/hr over 30 Minutes Intravenous Every 6 hours 10/31/21 1559 10/31/21 2326   10/31/21 0745  ceFAZolin (ANCEF) IVPB 2g/100 mL premix        2 g 200 mL/hr over 30 Minutes Intravenous On call to O.R. 10/31/21 0734 10/31/21 1115        Patient was given sequential compression devices, early ambulation, and chemoprophylaxis to prevent DVT.  Patient benefited maximally from hospital stay and there  were no complications.    Recent vital signs: Patient Vitals for the past 24 hrs:  BP Temp Temp src Pulse Resp SpO2  11/02/21 1125 (!) 80/48 98.4 F (36.9 C) Oral (!) 57 18 98 %  11/02/21 0500 129/71 -- -- 60 17 97 %  11/02/21 0049 (!) 130/55 -- -- 65 16 95 %  11/01/21 2121 (!) 128/50 98.3 F (36.8 C) Oral 69 -- 91 %  11/01/21 1535 (!) 150/59 98 F (36.7 C) Oral 81 17 96 %  11/01/21 1303 (!) 133/59 98.2 F (36.8 C) Oral 80 18 --     Recent laboratory studies:  Recent Labs    11/01/21 0323 11/02/21 0123  WBC 9.6 8.9  HGB 10.1* 10.1*  HCT 30.1* 31.1*  PLT 202 PLATELET CLUMPS NOTED ON SMEAR, UNABLE TO ESTIMATE  NA 133*  --   K 4.5  --   CL  102  --   CO2 25  --   BUN 19  --   CREATININE 1.19  --   GLUCOSE 128*  --   INR 1.2 1.5*  CALCIUM 8.8*  --      Discharge Medications:   Allergies as of 11/02/2021   No Known Allergies      Medication List     STOP taking these medications    enoxaparin 100 MG/ML injection Commonly known as: LOVENOX       TAKE these medications    acetaminophen 325 MG tablet Commonly known as: TYLENOL Take 325 mg by mouth every 6 (six) hours as needed for moderate pain.   docusate sodium 100 MG capsule Commonly known as: COLACE Take 1 capsule by mouth as needed for constipation.   HYDROcodone-acetaminophen 5-325 MG tablet Commonly known as: NORCO/VICODIN Take 1-2 tablets by mouth every 6 (six) hours as needed for moderate pain (pain score 4-6).   ketoconazole 2 % cream Commonly known as: NIZORAL Apply 1 application topically daily.   lisinopril 20 MG tablet Commonly known as: ZESTRIL Take 1 tablet (20 mg total) by mouth daily.   metFORMIN 500 MG tablet Commonly known as: GLUCOPHAGE TAKE 1 TABLET BY MOUTH ONCE DAILY WITH BREAKFAST   nystatin powder Generic drug: nystatin Apply 1 application topically 2 (two) times daily as needed for irritation.   OneTouch Delica Lancets 33H Misc USE 1  TO CHECK GLUCOSE TWICE DAILY What changed: See the new instructions.   OneTouch Verio test strip Generic drug: glucose blood USE 1 STRIP TO CHECK GLUCOSE TWICE DAILY AS DIRECTED What changed: See the new instructions.   OneTouch Verio w/Device Kit by Does not apply route. Check blood sugar twice daily as directed DX E11.65   simvastatin 20 MG tablet Commonly known as: ZOCOR TAKE 1 TABLET BY MOUTH ONCE DAILY FOR CHOLESTEROL What changed: additional instructions   SOMINEX PO Take 50 mg by mouth at bedtime.   traMADol 50 MG tablet Commonly known as: ULTRAM Take one to two tablets by mouth every 6 hours as needed for pain. Do not take more than 6 tablets in 24 hours   Vitamin  D3 50 MCG (2000 UT) Tabs Take 2,000 Units by mouth daily.   warfarin 5 MG tablet Commonly known as: COUMADIN Take as directed. If you are unsure how to take this medication, talk to your nurse or doctor. Original instructions: Take 5-7.5 mg by mouth See admin instructions. Every day take 1 (5 mg) tablet ALONG with a 10 mg tablet (total=15 mg). THEN on Monday's take a 1  and a half (7.5 mg) tablets ALONG with the 10 mg tablet (total=17.5 mg). What changed: Another medication with the same name was changed. Make sure you understand how and when to take each.   warfarin 10 MG tablet Commonly known as: COUMADIN Take as directed. If you are unsure how to take this medication, talk to your nurse or doctor. Original instructions: TAKE 1 TABLET BY MOUTH ONCE DAILY ALONG  WITH  A  5  MG  TABLET  FOR  A  TOTAL  OF  15  MG  DAILY What changed:  how much to take how to take this when to take this additional instructions               Durable Medical Equipment  (From admission, onward)           Start     Ordered   10/31/21 1600  DME Walker rolling  Once       Question Answer Comment  Walker: With 5 Inch Wheels   Patient needs a walker to treat with the following condition Status post total replacement of left hip      10/31/21 1559            Diagnostic Studies: DG Pelvis Portable  Result Date: 10/31/2021 CLINICAL DATA:  Left hip arthroplasty EXAM: PORTABLE PELVIS 1 VIEWS COMPARISON:  None. FINDINGS: Interval postsurgical changes from left total hip arthroplasty. Prior right total hip arthroplasty. Arthroplasty components appear in their expected alignment. No periprosthetic fracture is identified. Expected postoperative changes within the soft tissues overlying the left hip. IMPRESSION: Expected postsurgical changes of left total hip arthroplasty. Electronically Signed   By: Yetta Glassman M.D.   On: 10/31/2021 13:13   DG C-Arm 1-60 Min-No Report  Result Date:  10/31/2021 Fluoroscopy was utilized by the requesting physician.  No radiographic interpretation.   DG C-Arm 1-60 Min-No Report  Result Date: 10/31/2021 Fluoroscopy was utilized by the requesting physician.  No radiographic interpretation.   DG HIP UNILAT WITH PELVIS 1V LEFT  Result Date: 10/31/2021 CLINICAL DATA:  Left total hip arthroplasty EXAM: DG HIP (WITH OR WITHOUT PELVIS) 1V*L* COMPARISON:  07/29/2021 FINDINGS: Three fluoroscopic spot images are obtained and demonstrate a new left total hip arthroplasty with expected alignment and position. No periprosthetic fracture or complicating feature is apparent. Small metal markers on the midline of the lower pelvis are noted, possibly prostate fiducials. IMPRESSION: 1. Left total hip arthroplasty without imaging findings of complicating feature at this time. Electronically Signed   By: Van Clines M.D.   On: 10/31/2021 13:09    Disposition: Discharge disposition: 01-Home or Self Care          Follow-up Information     Mcarthur Rossetti, MD. Go on 11/14/2021.   Specialty: Orthopedic Surgery Why: at 3:00 pm for your first in office post operative appointment with Dr. Clarita Leber information: [place] [id number] [phone]         [facility], [place] Follow up.   Specialty: Home Health Services Why: Home health services will be provided by [place] information: [place] [address] [phone]                  Signed: Aundra Dubin 11/02/2021, 12:32 PM

## 2021-11-02 NOTE — Progress Notes (Signed)
Pt. Had a yellow mews due to low BP. Nurse assessed pt. said he was dizzy when standing and getting to the chair. Which resolved soon after sitting. Nurse rechecked vitals still low. Pt. Had current order for IV fluids. Nurse restated at 74mL. Provider came to see pt. Informed nurse to give it as a bolus, recheck BP after bolus. PT. Was working with pt. And got orthostatic pressure. Pressure are better pt. Able to walk unit with PT. Will continue to monitor.

## 2021-11-02 NOTE — Progress Notes (Signed)
Subjective: 2 Days Post-Op Procedure(s) (LRB): LEFT TOTAL HIP ARTHROPLASTY ANTERIOR APPROACH (Left) Patient reports pain as mild.  Patient hypotensive, but denies any lightheadedness, dizziness, chest pain/pressure/palpitations/sob.  No nausea/vomiting.  Objective: Vital signs in last 24 hours: Temp:  [98 F (36.7 C)-98.4 F (36.9 C)] 98.4 F (36.9 C) (02/11 1125) Pulse Rate:  [57-81] 57 (02/11 1125) Resp:  [16-18] 18 (02/11 1125) BP: (80-150)/(48-71) 80/48 (02/11 1125) SpO2:  [91 %-98 %] 98 % (02/11 1125)  Intake/Output from previous day: 02/10 0701 - 02/11 0700 In: 1060 [P.O.:720; I.V.:340] Out: 550 [Urine:550] Intake/Output this shift: No intake/output data recorded.  Recent Labs    11/01/21 0323 11/02/21 0123  HGB 10.1* 10.1*   Recent Labs    11/01/21 0323 11/02/21 0123  WBC 9.6 8.9  RBC 3.53* 3.57*  HCT 30.1* 31.1*  PLT 202 PLATELET CLUMPS NOTED ON SMEAR, UNABLE TO ESTIMATE   Recent Labs    11/01/21 0323  NA 133*  K 4.5  CL 102  CO2 25  BUN 19  CREATININE 1.19  GLUCOSE 128*  CALCIUM 8.8*   Recent Labs    11/01/21 0323 11/02/21 0123  INR 1.2 1.5*    Neurologically intact Neurovascular intact Sensation intact distally Intact pulses distally Dorsiflexion/Plantar flexion intact Incision: dressing C/D/I No cellulitis present Compartment soft   Assessment/Plan: 2 Days Post-Op Procedure(s) (LRB): LEFT TOTAL HIP ARTHROPLASTY ANTERIOR APPROACH (Left) Advance diet Up with therapy Discharge home with home health possibly today, but will need to make sure blood pressure has improved and that he mobilizes well with PT.   WBAT LLE ABLA- mild and stable Hypotension- received lisinopril this am around the same time as norco and robaxin and was hypotensive at his next reading two hours later which is likely what caused this pressure.  Currently asymptomatic.  I have ordered a one liter fluid bolus.  Hold bp meds and pain meds if needed.          Aundra Dubin 11/02/2021, 12:27 PM

## 2021-11-02 NOTE — Progress Notes (Signed)
Physical Therapy Treatment Patient Details Name: Colton Miranda MRN: [id number] DOB: 1941-08-12 Today's Date: 11/02/2021   History of Present Illness Pt is an 81 y/o male s/p L THA. PMH includes COPD, prostate cancer, a fib, HTN, CVA, DVT, PE, L TKA, and R THA.    PT Comments    Pt received in recliner, agreeable to therapy session with goal of stair training and gait progression. Pt making good progress toward functional mobility goals but limited due to fatigue and needing greatly increased time/verbal cues for safe technique and step sequencing. Orthostatics taken prior to gait/stairs, pt BP soft but stable and no dizziness or buckling (vitals below). Pt able to ascend/descend 4" curb step in room with up to +2 modA for safety/lift assist and fatigues after short household gait distance ~87ft. Pt needing >30 minutes to walk 78ft and ascend/descend step. Per family, pt will need to ambulate at least 70ft and ascend/descend standard height 7" step in order to enter home prior to having a seated break. Pt spouse requesting options for safe entry into home if he is still unable to perform standard height step next session, CM notified they may need info on PTAR transport pending progress next session. Pt continues to benefit from PT services to progress toward functional mobility goals. Anticipate pt will need 1-2 more sessions for gait/stair negotiation training to ensure safety for discharge home.    Recommendations for follow up therapy are one component of a multi-disciplinary discharge planning process, led by the attending physician.  Recommendations may be updated based on patient status, additional functional criteria and insurance authorization.  Follow Up Recommendations  Follow physician's recommendations for discharge plan and follow up therapies (pt spouse adamant about him going home once medically cleared but may not be able to provide much physical/lift assist.)     Assistance  Recommended at Discharge Frequent or constant Supervision/Assistance  Patient can return home with the following A little help with bathing/dressing/bathroom;Help with stairs or ramp for entrance;A lot of help with walking and/or transfers   Equipment Recommendations  None recommended by PT  (Consider PTAR transport home if pt unable to safely perform 7" step with family assist)   Recommendations for Other Services       Precautions / Restrictions Precautions Precautions: Fall Precaution Comments: limited L shld ROM Restrictions Weight Bearing Restrictions: Yes LLE Weight Bearing: Weight bearing as tolerated     Mobility  Bed Mobility Overal bed mobility: Needs Assistance Bed Mobility: Sit to Supine     Supine to sit: Mod assist, HOB elevated Sit to supine: Mod assist, +2 for safety/equipment   General bed mobility comments: use of strap to assist with lifting/rotating LLE from floor to EOB, needs trunk assist to lower safely to supine, increased time to perform and heavy cues again for technique; needs bed pad assist to scoot hips to middle of bed once supine, pt unable to bridge hips for repositioning in supine    Transfers Overall transfer level: Needs assistance Equipment used: Rolling walker (2 wheels) Transfers: Sit to/from Stand Sit to Stand: Mod assist           General transfer comment: cues for use of momentum strategy, min to modA lift assist needed from chair, needs 2 attempts to reach upright after initial unsuccessful attempt; x2 trials from chair.    Ambulation/Gait Ambulation/Gait assistance: Min assist, +2 safety/equipment Gait Distance (Feet): 35 Feet Assistive device: Rolling walker (2 wheels) Gait Pattern/deviations: Step-to pattern, Decreased weight shift to  left, Trunk flexed (consistent downward gaze) Gait velocity: <0.1 m/s Gait velocity interpretation: <1.31 ft/sec, indicative of household ambulator   General Gait Details: max cues for  upright posture and foot placement/ sequencing, pt with improved step length and R foot clearance today, very slow pace and frequent standing breaks needed   Stairs Stairs: Yes Stairs assistance: Mod assist, +2 physical assistance, +2 safety/equipment Stair Management: Step to pattern, Forwards, With walker Number of Stairs: 1 General stair comments: pt ascended single 4" platform step in room, did need greatly increased time, heavy sequencing cues and some manual assist to advance LLE when stepping down. Per pt/family, he will need to ascend/descend 7" step if going home, was not able to perform a step of that height today.   Wheelchair Mobility    Modified Rankin (Stroke Patients Only)       Balance Overall balance assessment: Needs assistance Sitting-balance support: Single extremity supported Sitting balance-Leahy Scale: Fair     Standing balance support: Bilateral upper extremity supported, Reliant on assistive device for balance Standing balance-Leahy Scale: Poor Standing balance comment: RW support and mostly minA for safety/RW mgmt                            Cognition Arousal/Alertness: Awake/alert Behavior During Therapy: WFL for tasks assessed/performed Overall Cognitive Status: Within Functional Limits for tasks assessed                                 General Comments: Slow processing and needs simple 1-step cues.        Exercises Other Exercises Other Exercises: seated LLE A/AAROM: LAQ (needs AA to achieve near knee extension, only able to perform ~50% of motion unassisted) x10 reps Other Exercises: supine LLE AROM: ankle pumps, heel slides (AA), hip aB/aDduction x5-10 reps ea Other Exercises: STS and static standing for BLE strengthening Other Exercises: portion of gait billed as TE for BLE strengthening    General Comments General comments (skin integrity, edema, etc.): BP somewhat soft in stance however no sx dizziness or nausea,  not diaphoretic. supine BP 121/60 (77), BP seated 104/67 (78), BP standing 102/58 (67); Supine BP 110/58 (71) post-exertion      Pertinent Vitals/Pain Pain Assessment Pain Assessment: 0-10 Pain Score: 8  Faces Pain Scale: Hurts even more Pain Location: L hip, LBP and R toe pain (RLE burning/shooting pain) Pain Descriptors / Indicators: Guarding, Grimacing, Discomfort, Burning Pain Intervention(s): Limited activity within patient's tolerance, Monitored during session     PT Goals (current goals can now be found in the care plan section) Acute Rehab PT Goals Patient Stated Goal: to go home, decreased low back and L hip pain PT Goal Formulation: With patient Time For Goal Achievement: 11/14/21 Progress towards PT goals: Progressing toward goals    Frequency    7X/week      PT Plan Current plan remains appropriate       AM-PAC PT "6 Clicks" Mobility   Outcome Measure  Help needed turning from your back to your side while in a flat bed without using bedrails?: A Lot Help needed moving from lying on your back to sitting on the side of a flat bed without using bedrails?: A Lot Help needed moving to and from a bed to a chair (including a wheelchair)?: A Lot Help needed standing up from a chair using your arms (e.g., wheelchair or  bedside chair)?: A Lot Help needed to walk in hospital room?: A Lot Help needed climbing 3-5 steps with a railing? : Total 6 Click Score: 11    End of Session Equipment Utilized During Treatment: Gait belt Activity Tolerance: Patient limited by fatigue;Patient limited by pain Patient left: in chair;with call bell/phone within reach;with chair alarm set;Other (comment) (NT planning to come into room to assist pt with bathing) Nurse Communication: Mobility status;Precautions;Other (comment) (Pt with signfiicant urinary incontinence, did not alert staff overnight or in AM.) PT Visit Diagnosis: Other abnormalities of gait and mobility (R26.89);Pain Pain  - Right/Left: Left Pain - part of body: Hip     Time: 1400-1459 PT Time Calculation (min) (ACUTE ONLY): 59 min  Charges:  $Gait Training: 23-37 mins $Therapeutic Exercise: 8-22 mins $Therapeutic Activity: 8-22 mins                     Hriday Stai P., PTA Acute Rehabilitation Services Pager: [phone] Office: [place] 11/02/2021, 3:25 PM

## 2021-11-03 DIAGNOSIS — Z96642 Presence of left artificial hip joint: Secondary | ICD-10-CM | POA: Diagnosis not present

## 2021-11-03 DIAGNOSIS — M1612 Unilateral primary osteoarthritis, left hip: Secondary | ICD-10-CM | POA: Diagnosis not present

## 2021-11-03 DIAGNOSIS — Z9181 History of falling: Secondary | ICD-10-CM | POA: Diagnosis not present

## 2021-11-03 LAB — PROTIME-INR
INR: 1.5 — ABNORMAL HIGH (ref 0.8–1.2)
Prothrombin Time: 18.2 seconds — ABNORMAL HIGH (ref 11.4–15.2)

## 2021-11-03 MED ORDER — WARFARIN SODIUM 6 MG PO TABS
16.0000 mg | ORAL_TABLET | Freq: Once | ORAL | Status: AC
  Administered 2021-11-03: 16 mg via ORAL
  Filled 2021-11-03 (×2): qty 1

## 2021-11-03 NOTE — TOC Progression Note (Signed)
Transition of Care [facility]) - Progression Note    Patient Details  Name: ISAHI GODWIN MRN: [id number] Date of Birth: 22-Oct-1940  Transition of Care [facility]) CM/SW [place], [place] Phone Number: 11/03/2021, 3:05 PM  Clinical Narrative:    CSW spoke with Jari Pigg at [facility] at  [phone].  CSW asked Jari Pigg to get insurance auth for [place] home.  Jari Pigg reports insurance may be completed to today or on Monday. Jari Pigg will contact CSW for insurance auth.  TOC will continue to assist with disposition planning.    Expected Discharge Plan: [place] Barriers to Discharge: Continued Medical Work up  Expected Discharge Plan and Services Expected Discharge Plan: [place]   Discharge Planning Services: CM Consult Post Acute Care Choice: [place] arrangements for the past 2 months: Apartment Expected Discharge Date: 11/03/21               DME Arranged:  (Patient has RW already; elevated toilets in the home)         HH Arranged: PT HH Agency: [place] Date [place]: 11/02/21 Time [place]: [phone] Representative spoke with at [place]: [place] ([place]) Interventions    Readmission Risk Interventions No flowsheet data found.

## 2021-11-03 NOTE — Progress Notes (Signed)
Occupational Therapy Treatment Patient Details Name: Colton Miranda MRN: [id number] DOB: [birth year] Today's Date: 11/03/2021   History of present illness Pt is an 81 y/o male s/p L THA. PMH includes COPD, prostate cancer, a fib, HTN, CVA, DVT, PE, L TKA, and R THA.   OT comments  Pt seen today for progression of ADLs, currently requiring min-mod A for ADLs and Mod A for transfers. Pt benefits from increased time and cuing, fatigues quickly. Attempted tub transfer using tub bench, however pt unable to bring legs over side of tub independently, unable to scoot self back on tub bench. Pt states his wife will not be able to help him out with this at home, due to difficulty -- pt plans to bathe sinkside until he regains some strength. Pt presenting with impairments listed below, will follow acutely to maximize safety/independence with ADLs and functional mobility.   Recommendations for follow up therapy are one component of a multi-disciplinary discharge planning process, led by the attending physician.  Recommendations may be updated based on patient status, additional functional criteria and insurance authorization.    Follow Up Recommendations  Follow physician's recommendations for discharge plan and follow up therapies    Assistance Recommended at Discharge Frequent or constant Supervision/Assistance  Patient can return home with the following  A lot of help with walking and/or transfers;A lot of help with bathing/dressing/bathroom;Assistance with cooking/housework;Assist for transportation;Help with stairs or ramp for entrance   Equipment Recommendations  None recommended by OT    Recommendations for Other Services PT consult    Precautions / Restrictions Precautions Precautions: Fall Precaution Comments: limited L shld ROM Restrictions Weight Bearing Restrictions: Yes LLE Weight Bearing: Weight bearing as tolerated       Mobility Bed Mobility               General bed  mobility comments: pt sitting EOB upon arrival    Transfers Overall transfer level: Needs assistance Equipment used: Rolling walker (2 wheels) Transfers: Sit to/from Stand, Bed to chair/wheelchair/BSC Sit to Stand: Mod assist           General transfer comment: Cues for hand placement and safety; Tends to brace backs of LEs against seated surface for stability at initial stand; heavily dependent on forward lean/momentum; decr control of descent to sit when fatigued     Balance Overall balance assessment: Needs assistance Sitting-balance support: Single extremity supported Sitting balance-Leahy Scale: Fair   Postural control: Right lateral lean, Posterior lean Standing balance support: Bilateral upper extremity supported, Reliant on assistive device for balance Standing balance-Leahy Scale: Poor Standing balance comment: Stood at EOB to use the urinal; leaned back against the bed, and braced with LEs for steadiness; RW support and mostly minA for safety/RW mgmt                           ADL either performed or assessed with clinical judgement   ADL Overall ADL's : Needs assistance/impaired Eating/Feeding: Independent;Bed level;Sitting   Grooming: Set up;Sitting;Wash/dry hands;Wash/dry face;Oral care                   Toilet Transfer: Magazine features editor Details (indicate cue type and reason): stands for toileting Toileting- Clothing Manipulation and Hygiene: Min guard Toileting - Clothing Manipulation Details (indicate cue type and reason): stands for toileting, able to manage urinal standing at bedside Tub/ Shower Transfer: Moderate assistance;Tub transfer;Tub bench;Rolling walker (2 wheels) Tub/Shower Transfer Details (indicate cue  type and reason): attempted, a bit anxious, unable to scoot hips back to lift leg over side of tub, states he will want to wash up sinkside at d/c.        Extremity/Trunk Assessment Upper Extremity Assessment Upper  Extremity Assessment: LUE deficits/detail LUE Deficits / Details: long standing shoulder limitations LUE Coordination: decreased gross motor   Lower Extremity Assessment Lower Extremity Assessment: Defer to PT evaluation        Vision   Vision Assessment?: No apparent visual deficits   Perception Perception Perception: Not tested   Praxis Praxis Praxis: Not tested    Cognition Arousal/Alertness: Awake/alert Behavior During Therapy: WFL for tasks assessed/performed Overall Cognitive Status: Within Functional Limits for tasks assessed                                 General Comments: Slow processing and needs simple 1-step cues.        Exercises Exercises: Other exercises    Shoulder Instructions       General Comments VSS on RA    Pertinent Vitals/ Pain       Pain Assessment Pain Assessment: Faces Pain Score: 2  Faces Pain Scale: Hurts a little bit Pain Location: L hip Pain Descriptors / Indicators: Guarding, Grimacing, Discomfort, Burning Pain Intervention(s): Limited activity within patient's tolerance, Monitored during session, Repositioned  Home Living                                          Prior Functioning/Environment              Frequency  Min 3X/week        Progress Toward Goals  OT Goals(current goals can now be found in the care plan section)  Progress towards OT goals: Progressing toward goals  Acute Rehab OT Goals OT Goal Formulation: With patient Time For Goal Achievement: 11/15/21 Potential to Achieve Goals: Good ADL Goals Pt Will Perform Grooming: with min guard assist Pt Will Perform Lower Body Bathing: with min assist;with adaptive equipment;sit to/from stand Pt Will Perform Lower Body Dressing: with min assist;sit to/from stand;with adaptive equipment Pt Will Transfer to Toilet: with min guard assist;bedside commode;ambulating Pt Will Perform Toileting - Clothing Manipulation and hygiene:  with min guard assist;sit to/from stand Additional ADL Goal #1: Pt will perform bed mobility with min assist in preparation for ADL.  Plan Discharge plan remains appropriate;Frequency remains appropriate    Co-evaluation    PT/OT/SLP Co-Evaluation/Treatment: Yes Reason for Co-Treatment: Complexity of the patient's impairments (multi-system involvement);For patient/therapist safety;To address functional/ADL transfers   OT goals addressed during session: ADL's and self-care      AM-PAC OT "6 Clicks" Daily Activity     Outcome Measure   Help from another person eating meals?: None Help from another person taking care of personal grooming?: A Little Help from another person toileting, which includes using toliet, bedpan, or urinal?: A Lot Help from another person bathing (including washing, rinsing, drying)?: A Lot Help from another person to put on and taking off regular upper body clothing?: A Little Help from another person to put on and taking off regular lower body clothing?: Total 6 Click Score: 15    End of Session Equipment Utilized During Treatment: Gait belt;Rolling walker (2 wheels)  OT Visit Diagnosis: Unsteadiness on feet (R26.81);Other abnormalities of  gait and mobility (R26.89);Pain;Muscle weakness (generalized) (M62.81) Pain - Right/Left: Left Pain - part of body: Hip   Activity Tolerance Patient tolerated treatment well   Patient Left in chair;with call bell/phone within reach;with chair alarm set   Nurse Communication Mobility status        Time: 1594-7076 OT Time Calculation (min): 52 min  Charges: OT General Charges $OT Visit: 1 Visit OT Treatments $Self Care/Home Management : 8-22 mins  Lynnda Child, OTD, OTR/L Acute Rehab [phone] - [place] 11/03/2021, 1:07 PM

## 2021-11-03 NOTE — Plan of Care (Signed)

## 2021-11-03 NOTE — Progress Notes (Signed)
Physical Therapy Treatment Patient Details Name: Colton Miranda MRN: [id number] DOB: [birth year] Today's Date: 11/03/2021   History of Present Illness Pt is an 81 y/o male s/p L THA. PMH includes COPD, prostate cancer, a fib, HTN, CVA, DVT, PE, L TKA, and R THA.    PT Comments    Continuing work on functional mobility and activity tolerance;  Session focused on progressive amb, and activities pivotal to being able to go home today; Notable improvement in gait, with min/minguard assist for in room amb; Lots of practice with sit<>stand transfers, getting to mostly min assist for sit to stand -- by the end of the session, though, needed mod assist to power up due to fatigue; also with decr control of descent to sit;   Pt sleeps in his recliner, which is also a lift chair, so that makes the fact that he needs assist to rise to stand a little less pivotal as far as dc readiness; Pt and wife are concerned for the 7 inch step to go up to get in to his home -- discussed and demonstrated 3 different techniques for getting up the single step; Also briefly discussed dc planning with wife on the phone, and she seems very much in favor of a non-emergent ambulance ride home -- if we choose the PTAR route to get home, it makes the ability to get up the higher step less pivotal as well; Discussed with [facility] RN -- plan to work with pt and wife for 2nd session early this afternoon, and will make the call re: possibility of gettign home today at that time   Recommendations for follow up therapy are one component of a multi-disciplinary discharge planning process, led by the attending physician.  Recommendations may be updated based on patient status, additional functional criteria and insurance authorization.  Follow Up Recommendations  Follow physician's recommendations for discharge plan and follow up therapies (pt spouse adamant about him going home once medically cleared but may not be able to provide much  physical/lift assist.)     Assistance Recommended at Discharge Frequent or constant Supervision/Assistance  Patient can return home with the following A little help with bathing/dressing/bathroom;Help with stairs or ramp for entrance;A lot of help with walking and/or transfers   Equipment Recommendations  Wheelchair (measurements PT) (consider non-emergent ambulance transport)    Recommendations for Other Services       Precautions / Restrictions Precautions Precautions: Fall Precaution Comments: limited L shld ROM Restrictions Weight Bearing Restrictions: Yes LLE Weight Bearing: Weight bearing as tolerated     Mobility  Bed Mobility Overal bed mobility: Needs Assistance Bed Mobility: Supine to Sit     Supine to sit: Mod assist, HOB elevated     General bed mobility comments: Heavy Mod assist and step-by-step cues for technique; used belt to move his LLE; Very slow moving, and tending to hold breath    Transfers Overall transfer level: Needs assistance Equipment used: Rolling walker (2 wheels) Transfers: Sit to/from Stand, Bed to chair/wheelchair/BSC Sit to Stand: Mod assist           General transfer comment: Cues for hand placement and safety; Tends to brace backs of LEs against seated surface for stability at initial stand; heavily dependent on forward lean/momentum; decr control of descent to sit when fatigued    Ambulation/Gait Ambulation/Gait assistance: Min guard, +2 safety/equipment Gait Distance (Feet): 35 Feet Assistive device: Rolling walker (2 wheels) Gait Pattern/deviations: Step-to pattern, Decreased weight shift to left, Trunk flexed (consistent  downward gaze)       General Gait Details: Continued slow pace; cues to activate L knee and hip extensors in stance; concern for fatigue; no reports of dizziness   Stairs         General stair comments: We discussed different options for going up the 7" step including forwards, backwards, and  placing a seat right up to the step, and pt "sit down" up the step; ultimately pt was too fatigued to practice, and we opted to wait until second session   Wheelchair Mobility    Modified Rankin (Stroke Patients Only)       Balance     Sitting balance-Leahy Scale: Fair       Standing balance-Leahy Scale: Poor Standing balance comment: Stood at EOB to use the urinal; leaned back against the bed, and braced with LEs for steadiness; RW support and mostly minA for safety/RW mgmt                            Cognition Arousal/Alertness: Awake/alert Behavior During Therapy: WFL for tasks assessed/performed Overall Cognitive Status: Within Functional Limits for tasks assessed                                 General Comments: Slow processing and needs simple 1-step cues.        Exercises      General Comments General comments (skin integrity, edema, etc.): No dizziness reported      Pertinent Vitals/Pain Pain Assessment Pain Assessment: Faces Faces Pain Scale: Hurts little more Pain Location: L hip Pain Descriptors / Indicators: Guarding, Grimacing, Discomfort, Burning Pain Intervention(s): Monitored during session    Home Living                          Prior Function            PT Goals (current goals can now be found in the care plan section) Acute Rehab PT Goals Patient Stated Goal: to go home, decreased low back and L hip pain PT Goal Formulation: With patient Time For Goal Achievement: 11/14/21 Potential to Achieve Goals: Good Progress towards PT goals: Progressing toward goals    Frequency    7X/week      PT Plan Current plan remains appropriate    Co-evaluation PT/OT/SLP Co-Evaluation/Treatment: Yes (dovetail at times) Reason for Co-Treatment: To address functional/ADL transfers (multidiscipinary look at ability to manage at home)          AM-PAC PT "6 Clicks" Mobility   Outcome Measure  Help needed  turning from your back to your side while in a flat bed without using bedrails?: A Lot Help needed moving from lying on your back to sitting on the side of a flat bed without using bedrails?: A Lot Help needed moving to and from a bed to a chair (including a wheelchair)?: A Lot Help needed standing up from a chair using your arms (e.g., wheelchair or bedside chair)?: A Lot Help needed to walk in hospital room?: A Little Help needed climbing 3-5 steps with a railing? : Total 6 Click Score: 12    End of Session Equipment Utilized During Treatment: Gait belt Activity Tolerance: Patient limited by fatigue;Patient limited by pain Patient left: in chair;with call bell/phone within reach;with chair alarm set Nurse Communication: Mobility status PT Visit Diagnosis: Other abnormalities of  gait and mobility (R26.89);Pain Pain - Right/Left: Left Pain - part of body: Hip     Time: 8159-4707 PT Time Calculation (min) (ACUTE ONLY): 62 min  Charges:  $Gait Training: 8-22 mins $Therapeutic Activity: 8-22 mins                     Roney Marion, PT  Acute Rehabilitation Services Pager [phone] Office [place] 11/03/2021, 11:37 AM

## 2021-11-03 NOTE — TOC Progression Note (Addendum)
Transition of Care [facility]) - Progression Note    Patient Details  Name: Colton Miranda MRN: [id number] Date of Birth: Jul 26, 1941  [facility]) CM/SW Contact  Bartholomew Crews, RN Phone Number: [phone] 11/03/2021, 2:48 PM  Clinical Narrative:     Spoke with patient, spouse, and daughter at the bedside to discuss post acute transition. Patient will need 3/1 - referral to [employer] to deliver to the room. Patient declined wc at this time d/t family having one that patient can use. Discussed HH PT - requesting RN for coumadin monitoring d/t limited mobility. Paitent will need HH RN, PT orders with Face to Face. Patient needing ambulance transport to be able to get into home. Patient insurance requires authorization which cannot be obtained on Sunday. Will request authorization on Monday. TOC following for transition needs.   UPDATE: Thank you to CSW for assisting with insurance authorization for ambulance transport - authorization pending.  Expected Discharge Plan: [place] Barriers to Discharge: Continued Medical Work up  Expected Discharge Plan and Services Expected Discharge Plan: [place]   Discharge Planning Services: CM Consult Post Acute Care Choice: [place] arrangements for the past 2 months: Apartment Expected Discharge Date: 11/03/21               DME Arranged:  (Patient has RW already; elevated toilets in the home)         HH Arranged: PT HH Agency: [place] Date [place]: 11/02/21 Time [place]: 3212 Representative spoke with at [place]: [place] ([place]) Interventions    Readmission Risk Interventions No flowsheet data found.

## 2021-11-03 NOTE — Progress Notes (Signed)
No events Patient still mobilizing slowly with PT Possible d/c home today if he does well with PT  N. Eduard Roux, MD [facility] 9:18 AM

## 2021-11-03 NOTE — Progress Notes (Signed)
ANTICOAGULATION CONSULT NOTE - Follow Up Consult  Pharmacy Consult for Warfarin Indication:  Atrial Fibrillation, Hx VTE, Hypercoaguable State (per PCP encounter documentation)  No Known Allergies  Patient Measurements: Height: 5\' 6"  (167.6 cm) Weight: 101.2 kg (223 lb) IBW/kg (Calculated) : 63.8   Vital Signs: Temp: 98.4 F (36.9 C) (02/12 0550) Temp Source: Oral (02/12 0550) BP: 133/50 (02/12 0550) Pulse Rate: 60 (02/12 0550)  Labs: Recent Labs    10/31/21 0810 11/01/21 0323 11/02/21 0123 11/03/21 0311  HGB  --  10.1* 10.1*  --   HCT  --  30.1* 31.1*  --   PLT  --  202 PLATELET CLUMPS NOTED ON SMEAR, UNABLE TO ESTIMATE  --   APTT 36  --   --   --   LABPROT 15.3* 15.1 18.4* 18.2*  INR 1.2 1.2 1.5* 1.5*  CREATININE  --  1.19  --   --      Estimated Creatinine Clearance: 55.2 mL/min (by C-G formula based on SCr of 1.19 mg/dL).  Assessment: History: 81 yr old man with prostate cancer, S/P L total hip arthroplasty today. Pt was on warfarin PTA for atrial fibrillation, hx DVT/PE, hypercoagulable state, protein C/S deficiency. Pt was taking warfarin 17.5 mg on Mondays and 15 mg on other days of the week.   2/9-  Per pt's wife, he was bridged with Lovenox before the procedure (last dose yesterday- 10/30/2021); per Dr Ninfa Linden this afternoon, pt to resume warfarin this evening (2/9) with no bridging. Patient was provided with 17.5 mg at 18:11  2/10- H/H 10.1/30.1, plt 202; INR today 1.2 (03:23). Per RN, no bleeding issues observed post op. Pt on thin diet. No drug-drug interaction issues at this time. Provider ordered a onetime Lovenox 100 mg [place] injection.  2/11- H/H 10.1/31.1, PLT clumps on smear- lab unable to estimate, INR 1.5. Per RN, no bleeding.  2/12- INR 1.5  Per RN, no signs of bleeding  Goal of Therapy:  INR 2-3 Monitor platelets by anticoagulation protocol: Yes   Plan:  Warfarin 16 mg X 1 this evening Monitor daily INR, CBC Monitor for bleeding  Ardon Franklin BS, PharmD, BCPS Clinical Pharmacist 11/03/2021,7:57 AM

## 2021-11-03 NOTE — Progress Notes (Signed)
°  °  Durable Medical Equipment  (From admission, onward)           Start     Ordered   11/03/21 0837  For home use only DME lightweight manual wheelchair with seat cushion  Once       Comments: Patient suffers from left hip replacement which impairs their ability to perform daily activities like bathing, dressing, and toileting in the home.  A walker will not resolve  issue with performing activities of daily living. A wheelchair will allow patient to safely perform daily activities. Patient is not able to propel themselves in the home using a standard weight wheelchair due to general weakness. Patient can self propel in the lightweight wheelchair. Length of need 6 months . Accessories: elevating leg rests (ELRs), wheel locks, extensions and anti-tippers.   11/03/21 0836   10/31/21 1600  DME Walker rolling  Once       Question Answer Comment  Walker: With 5 Inch Wheels   Patient needs a walker to treat with the following condition Status post total replacement of left hip      10/31/21 1559

## 2021-11-03 NOTE — Progress Notes (Addendum)
Physical Therapy Treatment Patient Details Name: Colton Miranda MRN: [id number] DOB: 1941-01-11 Today's Date: 11/03/2021   History of Present Illness Pt is an 81 y/o male s/p L THA. PMH includes COPD, prostate cancer, a fib, HTN, CVA, DVT, PE, L TKA, and R THA.    PT Comments    Continuing work on functional mobility and activity tolerance;  Session focused on family involvement in prep for dc home, including working on step negotiation; Pt continues with slow steps, slow rise to standing, and tendency to fatigue quickly; Discussed use of WC to help with energy conservation (especially when coming into and out of his home); Discussed car transfers, and ultimately will request his appt on Tuesday be handled by [facility] (thanks, TOC RN); Given tendency to fatigue, and continuing difficulty with steps, non-emergent ambulance transport home is safest (thanks, TOC SW); Questions solicited and answered   Recommendations for follow up therapy are one component of a multi-disciplinary discharge planning process, led by the attending physician.  Recommendations may be updated based on patient status, additional functional criteria and insurance authorization.  Follow Up Recommendations  Follow physician's recommendations for discharge plan and follow up therapies (pt spouse adamant about him going home once medically cleared but may not be able to provide much physical/lift assist.)     Assistance Recommended at Discharge Frequent or constant Supervision/Assistance  Patient can return home with the following A little help with bathing/dressing/bathroom;Help with stairs or ramp for entrance;A lot of help with walking and/or transfers   Equipment Recommendations  Wheelchair (measurements PT);BSC/3in1 (consider non-emergent ambulance transport)    Recommendations for Other Services       Precautions / Restrictions Precautions Precautions: Fall Precaution Comments: limited L shld  ROM Restrictions Weight Bearing Restrictions: Yes LLE Weight Bearing: Weight bearing as tolerated     Mobility  Bed Mobility Overal bed mobility: Needs Assistance Bed Mobility: Supine to Sit     Supine to sit: Mod assist, HOB elevated     General bed mobility comments: Heavy Mod assist and step-by-step cues for technique; used belt to move his LLE; Very slow moving, and tending to hold breath    Transfers Overall transfer level: Needs assistance Equipment used: Rolling walker (2 wheels) Transfers: Sit to/from Stand Sit to Stand: Min assist           General transfer comment: Cues for hand placement and safety; Tends to brace backs of LEs against seated surface for stability at initial stand; heavily dependent on forward lean/momentum; decr control of descent to sit when fatigued    Ambulation/Gait Ambulation/Gait assistance: Min guard Gait Distance (Feet): 20 Feet Assistive device: Rolling walker (2 wheels) Gait Pattern/deviations: Step-to pattern, Decreased weight shift to left, Trunk flexed (consistent downward gaze)       General Gait Details: Continued slow pace; cues to activate L knee and hip extensors in stance; concern for fatigue; no reports of dizziness   Stairs Stairs: Yes Stairs assistance: Mod assist, +2 physical assistance Stair Management: Step to pattern, Forwards, With walker Number of Stairs: 1 General stair comments: Incr time; close monitor of activity tolerance; Pt and wife tend to manage the step with wife in front steadying RW; Pt attemtped to lead with LLE to step down, however it was very uncomfortable, and he tehn stepped back up onto step and lead with RLE going down; demonstrated "sit up the step onto a chair" technique to pt's wife and daughter   Wheelchair Mobility    Modified Rankin (  Stroke Patients Only)       Balance     Sitting balance-Leahy Scale: Fair       Standing balance-Leahy Scale: Poor Standing balance comment:  Stood at EOB to use the urinal; leaned back against the bed, and braced with LEs for steadiness; RW support and mostly minA for safety/RW mgmt                            Cognition Arousal/Alertness: Awake/alert Behavior During Therapy: WFL for tasks assessed/performed Overall Cognitive Status: Within Functional Limits for tasks assessed                                 General Comments: Cooperative, tangential        Exercises      General Comments General comments (skin integrity, edema, etc.): Very lengthy conversation about car transfers, 7 inch step to enter home, Possibility of home today or tomorrow, appointment on Tuesday (can it be [facility]?); Ultimately, 7inch step was too much today, and we are officially requesting PTAR ride home; plan for HHPT for work on step and car transfers      Pertinent Vitals/Pain Pain Assessment Pain Assessment: Faces Faces Pain Scale: Hurts little more Pain Location: L hip Pain Descriptors / Indicators: Guarding, Grimacing, Discomfort, Burning Pain Intervention(s): Monitored during session    Home Living                          Prior Function            PT Goals (current goals can now be found in the care plan section) Acute Rehab PT Goals Patient Stated Goal: to go home, decreased low back and L hip pain PT Goal Formulation: With patient Time For Goal Achievement: 11/14/21 Potential to Achieve Goals: Good Progress towards PT goals: Progressing toward goals    Frequency    7X/week      PT Plan Current plan remains appropriate    Co-evaluation   Reason for Co-Treatment: Complexity of the patient's impairments (multi-system involvement);For patient/therapist safety;To address functional/ADL transfers   OT goals addressed during session: ADL's and self-care      AM-PAC PT "6 Clicks" Mobility   Outcome Measure  Help needed turning from your back to your side while in a flat bed without  using bedrails?: A Lot Help needed moving from lying on your back to sitting on the side of a flat bed without using bedrails?: A Lot Help needed moving to and from a bed to a chair (including a wheelchair)?: A Lot Help needed standing up from a chair using your arms (e.g., wheelchair or bedside chair)?: A Lot Help needed to walk in hospital room?: A Little Help needed climbing 3-5 steps with a railing? : Total 6 Click Score: 12    End of Session Equipment Utilized During Treatment: Gait belt Activity Tolerance: Patient limited by fatigue;Patient limited by pain Patient left: in chair;with call bell/phone within reach;with chair alarm set Nurse Communication: Mobility status PT Visit Diagnosis: Other abnormalities of gait and mobility (R26.89);Pain Pain - Right/Left: Left Pain - part of body: Hip     Time: 1191-4782 PT Time Calculation (min) (ACUTE ONLY): 63 min  Charges:  $Gait Training: 8-22 mins $Therapeutic Activity: 8-22 mins $Self Care/Home Management: 23-37  Roney Marion, [place]  Acute Rehabilitation Services Pager [phone] Office [phone]    [place] 11/03/2021, 3:43 PM

## 2021-11-04 ENCOUNTER — Telehealth: Payer: Self-pay | Admitting: *Deleted

## 2021-11-04 DIAGNOSIS — M1612 Unilateral primary osteoarthritis, left hip: Secondary | ICD-10-CM | POA: Diagnosis not present

## 2021-11-04 DIAGNOSIS — R531 Weakness: Secondary | ICD-10-CM | POA: Diagnosis not present

## 2021-11-04 DIAGNOSIS — Z7401 Bed confinement status: Secondary | ICD-10-CM | POA: Diagnosis not present

## 2021-11-04 LAB — PROTIME-INR
INR: 1.6 — ABNORMAL HIGH (ref 0.8–1.2)
Prothrombin Time: 19.3 seconds — ABNORMAL HIGH (ref 11.4–15.2)

## 2021-11-04 MED ORDER — WARFARIN SODIUM 7.5 MG PO TABS: 17.5000 mg | ORAL_TABLET | Freq: Once | ORAL | Status: DC

## 2021-11-04 NOTE — TOC Transition Note (Signed)
Transition of Care [facility]) - CM/SW Discharge Note   Patient Details  Name: Colton Miranda MRN: [id number] Date of Birth: [birth year]  Transition of Care [facility]) CM/SW Contact:  Bartholomew Crews, RN Phone Number: [phone] 11/04/2021, 9:47 AM   Clinical Narrative:     Received call from HTA. Ambulance transport approved with reference [id number]. Patient informed at bedside. Spoke with wife on the phone. Confirmed 3/1 received yesterday. Advised of ambulance approval. Confirmed address. PTAR arranged. Medical transport paperwork on shadow chart. [facility] at [facility] aware. No further TOC needs identifed.   Final next level of care: Home w Home Health Services Barriers to Discharge: No Barriers Identified   Patient Goals and CMS Choice Patient states their goals for this hospitalization and ongoing recovery are:: home with wife CMS Medicare.gov Compare Post Acute Care list provided to:: Patient Choice offered to / list presented to : Patient  Discharge Placement                       Discharge Plan and Services   Discharge Planning Services: CM Consult Post Acute Care Choice: Home Health          DME Arranged: 3-N-1 DME Agency: [employer] Date DME Agency Contacted: 11/03/21 Time DME Agency Contacted: 1500 Representative spoke with at DME Agency: [place]: RN, PT [place] Agency: [place] Date [place]: 11/04/21 Time [place]: [phone] Representative spoke with at [place]: [place] ([place]) Interventions     Readmission Risk Interventions No flowsheet data found.

## 2021-11-04 NOTE — Telephone Encounter (Signed)
Ortho bundle d/c call. Patient discharged from hospital this morning. Discussed with wife, who states he's doing well at home. Concerned regarding if [facility] will draw PT/INR for him at home since difficulty getting out currently> Spoke with [place], who did receive order from hospital CM to add 1 time PT/INR this week. [facility] RNCM called to Dr. Raul Del office and left VM for [place] to update that PT/INR draw was ordered. Wanted to make sure they were aware since they typically follow for Coumadin dosing and lab draws related to this.

## 2021-11-04 NOTE — Discharge Summary (Signed)
Patient ID: Colton Miranda MRN: [id number] DOB/AGE: 11-02-1940 81 y.o.  Admit date: 10/31/2021 Discharge date: 11/04/2021  Admission Diagnoses:  Principal Problem:   Unilateral primary osteoarthritis, left hip Active Problems:   Status post total replacement of left hip   Discharge Diagnoses:  Same  Past Medical History:  Diagnosis Date   BPH associated with nocturia    Chronic constipation    COPD (chronic obstructive pulmonary disease) (HCC)    Depression    Eczema    Heart murmur    at birth   History of adenomatous polyp of colon    History of basal cell carcinoma (BCC) of skin    post excision from trunk   History of CVA (cerebrovascular accident) without residual deficits    11-06-2017 per pt and wife "stroke was approx. 20 years ago , 1999, caused by blood clot after knee scope"  DVT to PE   History of depression    over 40 years ago nervous breakdown   History of DVT (deep vein thrombosis)    11-06-2017 per pt and wife happened lower leg approx. 1999 after knee scope   History of pulmonary embolus (PE)    11-06-2017 from DVT in approx. 1999 per wife and pt   HOH (hard of hearing)    Hyperlipidemia    Hypertension    followed by pcp   Long term (current) use of anticoagulants Coumadin-- followed by Pharmasist w/ [facility]   secondary to primary hypercoagulopathy w/ hx DVT and PE   Myalgia and myositis, unspecified    OA (osteoarthritis)    "all joints"   PAF (paroxysmal atrial fibrillation) ([place]) 2019   Peripheral neuropathy    Pinched nerve in neck    per pt causes intermittant numbness bilateral arms and hands   Primary hypercoagulable state ([place])    Prostate cancer [facility]) urologist-  dr Alyson Ingles  oncologist-- dr Tammi Klippel   dx 08-28-2017  Stage T1c, Gleason 4+4, PSA 14.1, vol 42.8cc-- planned treatment external radiation and ADT   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 male who was admitted 10/31/2021 for operative treatment ofUnilateral primary osteoarthritis, left hip. Patient has severe unremitting pain that affects sleep, daily activities, and work/hobbies. After pre-op clearance the patient was taken to the operating room on 10/31/2021 and underwent  Procedure(s): LEFT TOTAL HIP ARTHROPLASTY ANTERIOR APPROACH.    Patient was given perioperative antibiotics:  Anti-infectives (From admission, onward)    Start     Dose/Rate Route Frequency Ordered Stop   10/31/21 1700  ceFAZolin (ANCEF) IVPB 1 g/50 mL premix        1 g 100 mL/hr over 30 Minutes Intravenous Every 6 hours 10/31/21 1559 10/31/21 2326   10/31/21 0745  ceFAZolin (ANCEF) IVPB 2g/100 mL premix        2 g 200 mL/hr over 30 Minutes Intravenous On call to O.R. 10/31/21 0734 10/31/21 1115        Patient was given sequential compression devices, early ambulation, and chemoprophylaxis to prevent DVT.  Patient benefited maximally from hospital stay and there  were no complications.    Recent vital signs: Patient Vitals for the past 24 hrs:  BP Temp Temp src Pulse Resp SpO2  11/04/21 0411 (!) 131/44 98.3 F (36.8 C) -- (!) 59 17 97 %  11/03/21 1959 (!) 120/57 99 F (37.2 C) Oral 70 19 97 %  11/03/21 1450 (!) 113/56 97.9 F (36.6 C) Oral 66 18 96 %     Recent laboratory studies:  Recent Labs    11/02/21 0123 11/03/21 0311 11/04/21 0108  WBC 8.9  --   --   HGB 10.1*  --   --   HCT 31.1*  --   --   PLT PLATELET CLUMPS NOTED ON SMEAR, UNABLE TO ESTIMATE  --   --   INR 1.5* 1.5* 1.6*     Discharge Medications:   Allergies as of 11/04/2021   No Known Allergies      Medication List     STOP  taking these medications    enoxaparin 100 MG/ML injection Commonly known as: LOVENOX       TAKE these medications    acetaminophen 325 MG tablet Commonly known as: TYLENOL Take 325 mg by mouth every 6 (six) hours as needed for moderate pain.   docusate sodium 100 MG capsule Commonly known as: COLACE Take 1 capsule by mouth as needed for constipation.   HYDROcodone-acetaminophen 5-325 MG tablet Commonly known as: NORCO/VICODIN Take 1-2 tablets by mouth every 6 (six) hours as needed for moderate pain (pain score 4-6).   ketoconazole 2 % cream Commonly known as: NIZORAL Apply 1 application topically daily.   lisinopril 20 MG tablet Commonly known as: ZESTRIL Take 1 tablet (20 mg total) by mouth daily.   metFORMIN 500 MG tablet Commonly known as: GLUCOPHAGE TAKE 1 TABLET BY MOUTH ONCE DAILY WITH BREAKFAST   nystatin powder Generic drug: nystatin Apply 1 application topically 2 (two) times daily as needed for irritation.   OneTouch Delica Lancets 27X Misc USE 1  TO CHECK GLUCOSE TWICE DAILY What changed: See the new instructions.   OneTouch Verio test strip Generic drug: glucose blood USE 1 STRIP TO CHECK GLUCOSE TWICE DAILY AS DIRECTED What changed: See the new instructions.   OneTouch Verio w/Device Kit by Does not apply route. Check blood sugar twice daily as directed DX E11.65   simvastatin 20 MG tablet Commonly known as: ZOCOR TAKE 1 TABLET BY MOUTH ONCE DAILY FOR CHOLESTEROL What changed: additional instructions   SOMINEX PO Take 50 mg by mouth at bedtime.   traMADol 50 MG tablet Commonly known as: ULTRAM Take one to two tablets by mouth every 6 hours as needed for pain. Do not take more than 6 tablets in 24 hours   Vitamin D3 50 MCG (2000 UT) Tabs Take 2,000 Units by mouth daily.   warfarin 5 MG tablet Commonly known as: COUMADIN Take as directed. If you are unsure how to take this medication, talk to your nurse or doctor. Original instructions:  Take 5-7.5 mg by mouth See admin instructions. Every day take 1 (5 mg) tablet ALONG with a 10 mg tablet (total=15 mg). THEN on Monday's take a 1 and a half (7.5 mg) tablets ALONG with the 10 mg tablet (total=17.5 mg). What changed: Another medication with the same name was changed. Make sure you understand how and when to take each.   warfarin 10 MG tablet Commonly known as: COUMADIN Take as directed. If you are unsure how to take this medication, talk to your  nurse or doctor. Original instructions: TAKE 1 TABLET BY MOUTH ONCE DAILY ALONG  WITH  A  5  MG  TABLET  FOR  A  TOTAL  OF  15  MG  DAILY What changed:  how much to take how to take this when to take this additional instructions               Durable Medical Equipment  (From admission, onward)           Start     Ordered   11/03/21 1446  For home use only DME 3 n 1  Once        11/03/21 1445   11/03/21 0837  For home use only DME lightweight manual wheelchair with seat cushion  Once       Comments: Patient suffers from left hip replacement which impairs their ability to perform daily activities like bathing, dressing, and toileting in the home.  A walker will not resolve  issue with performing activities of daily living. A wheelchair will allow patient to safely perform daily activities. Patient is not able to propel themselves in the home using a standard weight wheelchair due to general weakness. Patient can self propel in the lightweight wheelchair. Length of need 6 months . Accessories: elevating leg rests (ELRs), wheel locks, extensions and anti-tippers.   11/03/21 0836   10/31/21 1600  DME Walker rolling  Once       Question Answer Comment  Walker: With 5 Inch Wheels   Patient needs a walker to treat with the following condition Status post total replacement of left hip      10/31/21 1559            Diagnostic Studies: DG Pelvis Portable  Result Date: 10/31/2021 CLINICAL DATA:  Left hip arthroplasty EXAM:  PORTABLE PELVIS 1 VIEWS COMPARISON:  None. FINDINGS: Interval postsurgical changes from left total hip arthroplasty. Prior right total hip arthroplasty. Arthroplasty components appear in their expected alignment. No periprosthetic fracture is identified. Expected postoperative changes within the soft tissues overlying the left hip. IMPRESSION: Expected postsurgical changes of left total hip arthroplasty. Electronically Signed   By: Yetta Glassman M.D.   On: 10/31/2021 13:13   DG C-Arm 1-60 Min-No Report  Result Date: 10/31/2021 Fluoroscopy was utilized by the requesting physician.  No radiographic interpretation.   DG C-Arm 1-60 Min-No Report  Result Date: 10/31/2021 Fluoroscopy was utilized by the requesting physician.  No radiographic interpretation.   DG HIP UNILAT WITH PELVIS 1V LEFT  Result Date: 10/31/2021 CLINICAL DATA:  Left total hip arthroplasty EXAM: DG HIP (WITH OR WITHOUT PELVIS) 1V*L* COMPARISON:  07/29/2021 FINDINGS: Three fluoroscopic spot images are obtained and demonstrate a new left total hip arthroplasty with expected alignment and position. No periprosthetic fracture or complicating feature is apparent. Small metal markers on the midline of the lower pelvis are noted, possibly prostate fiducials. IMPRESSION: 1. Left total hip arthroplasty without imaging findings of complicating feature at this time. Electronically Signed   By: Van Clines M.D.   On: 10/31/2021 13:09    Disposition: Discharge disposition: 01-Home or Self Care       Discharge Instructions     Call MD / Call 911   Complete by: As directed    If you experience chest pain or shortness of breath, CALL 911 and be transported to the hospital emergency room.  If you develope a fever above 101.5 F, pus (white drainage) or increased drainage or redness at the  wound, or calf pain, call your surgeon's office.   Constipation Prevention   Complete by: As directed    Drink plenty of fluids.  Prune juice may be  helpful.  You may use a stool softener, such as Colace (over the counter) 100 mg twice a day.  Use MiraLax (over the counter) for constipation as needed.   Driving restrictions   Complete by: As directed    No driving while taking narcotic pain meds.   Increase activity slowly as tolerated   Complete by: As directed    Post-operative opioid taper instructions:   Complete by: As directed    POST-OPERATIVE OPIOID TAPER INSTRUCTIONS: It is important to wean off of your opioid medication as soon as possible. If you do not need pain medication after your surgery it is ok to stop day one. Opioids include: Codeine, Hydrocodone(Norco, Vicodin), Oxycodone(Percocet, oxycontin) and hydromorphone amongst others.  Long term and even short term use of opiods can cause: Increased pain response Dependence Constipation Depression Respiratory depression And more.  Withdrawal symptoms can include Flu like symptoms Nausea, vomiting And more Techniques to manage these symptoms Hydrate well Eat regular healthy meals Stay active Use relaxation techniques(deep breathing, meditating, yoga) Do Not substitute Alcohol to help with tapering If you have been on opioids for less than two weeks and do not have pain than it is ok to stop all together.  Plan to wean off of opioids This plan should start within one week post op of your joint replacement. Maintain the same interval or time between taking each dose and first decrease the dose.  Cut the total daily intake of opioids by one tablet each day Next start to increase the time between doses. The last dose that should be eliminated is the evening dose.           Follow-up Information     Mcarthur Rossetti, MD. Go on 11/14/2021.   Specialty: Orthopedic Surgery Why: at 3:00 pm for your first in office post operative appointment with Dr. Clarita Leber information: [place] [address] [phone]         Health,  [place] Follow up.   Specialty: Home Health Services Why: Home health services will be provided by [facility] information: [place] [address] [phone]                  Signed: Mcarthur Rossetti 11/04/2021, 7:54 AM

## 2021-11-04 NOTE — TOC Transition Note (Addendum)
Transition of Care [facility]) - CM/SW Discharge Note   Patient Details  Name: Colton Miranda MRN: [id number] Date of Birth: August 16, 1941  Transition of Care [facility]) CM/SW Contact:  Bartholomew Crews, RN Phone Number: [phone] 11/04/2021, 8:21 AM   Clinical Narrative:     Call to HTA to f/u with ambulance authorization, still pending. Contact number provided.  Requested [facility] RN for coumadin monitoring. Notified Stacie at [facility] of request.     Barriers to Discharge: Continued Medical Work up   Patient Goals and CMS Choice Patient states their goals for this hospitalization and ongoing recovery are:: return home with wife CMS Medicare.gov Compare Post Acute Care list provided to:: Patient Choice offered to / list presented to : Patient  Discharge Placement                       Discharge Plan and Services   Discharge Planning Services: CM Consult Post Acute Care Choice: Home Health          DME Arranged:  (Patient has RW already; elevated toilets in the home)         HH Arranged: PT HH Agency: [place] Date [place]: 11/02/21 Time [place]: [id number] Representative spoke with at [place]: [place] ([place]) Interventions     Readmission Risk Interventions No flowsheet data found.

## 2021-11-04 NOTE — Progress Notes (Signed)
Patient ID: Colton Miranda, male   DOB: 1941-05-27, 81 y.o.   MRN: [id number] No acute changes.  Left hip stable.  Vitals stable.  Can be discharged to home today.

## 2021-11-04 NOTE — Progress Notes (Signed)
ANTICOAGULATION CONSULT NOTE - Follow Up Consult  Pharmacy Consult for Warfarin Indication:  Atrial Fibrillation, Hx VTE, Hypercoaguable State (per PCP encounter documentation)  No Known Allergies  Patient Measurements: Height: 5\' 6"  (167.6 cm) Weight: 101.2 kg (223 lb) IBW/kg (Calculated) : 63.8   Vital Signs: Temp: 98.3 F (36.8 C) (02/13 0411) BP: 131/44 (02/13 0411) Pulse Rate: 59 (02/13 0411)  Labs: Recent Labs    11/02/21 0123 11/03/21 0311 11/04/21 0108  HGB 10.1*  --   --   HCT 31.1*  --   --   PLT PLATELET CLUMPS NOTED ON SMEAR, UNABLE TO ESTIMATE  --   --   LABPROT 18.4* 18.2* 19.3*  INR 1.5* 1.5* 1.6*     Estimated Creatinine Clearance: 55.2 mL/min (by C-G formula based on SCr of 1.19 mg/dL).  Assessment: History: 81 yr old man with prostate cancer, S/P L total hip arthroplasty today. Pt was on warfarin PTA for atrial fibrillation, hx DVT/PE, hypercoagulable state, protein C/S deficiency. Pt was taking warfarin 17.5 mg on Mondays and 15 mg on other days of the week.   2/9-  Per pt's wife, he was bridged with Lovenox before the procedure (last dose yesterday- 10/30/2021); per Dr Ninfa Linden this afternoon, pt to resume warfarin this evening (2/9) with no bridging. Patient was provided with 17.5 mg at 18:11  2/10- H/H 10.1/30.1, plt 202; INR today 1.2 (03:23). Per RN, no bleeding issues observed post op. Pt on thin diet. No drug-drug interaction issues at this time. Provider ordered a onetime Lovenox 100 mg Effort injection.  2/11- H/H 10.1/31.1, PLT clumps on smear- lab unable to estimate, INR 1.5. Per RN, no bleeding.  2/12- INR 1.5  Per RN, no signs of bleeding  2/13- INR 1.6 Per RN, no signs of bleeding  Goal of Therapy:  INR 2-3 Monitor platelets by anticoagulation protocol: Yes   Plan:  Warfarin 17.5 mg X 1 this evening Monitor daily INR, CBC Monitor for bleeding  Vaughan Basta BS, PharmD, BCPS Clinical Pharmacist 11/04/2021,9:06 AM

## 2021-11-04 NOTE — Progress Notes (Signed)
Occupational Therapy Treatment Patient Details Name: Colton Miranda MRN: [id number] DOB: 14-Jul-1941 Today's Date: 11/04/2021   History of present illness Pt is an 81 y/o male s/p L THA. PMH includes COPD, prostate cancer, a fib, HTN, CVA, DVT, PE, L TKA, and R THA.   OT comments  Pt seen for progression of ADLs this session, improving towards goals. Pt mod A for bed mobility with use of strap, min A for transfers, benefits from elevated surface. Continues to require cues for hand placement/safety. Pt min-mod A for dressing this session, educated on compensatory strategies when dressing/undressing. Pt states spouse can help with this at home. Pt presenting with impairments listed below, will follow.    Recommendations for follow up therapy are one component of a multi-disciplinary discharge planning process, led by the attending physician.  Recommendations may be updated based on patient status, additional functional criteria and insurance authorization.    Follow Up Recommendations  Follow physician's recommendations for discharge plan and follow up therapies    Assistance Recommended at Discharge Frequent or constant Supervision/Assistance  Patient can return home with the following  A lot of help with walking and/or transfers;A lot of help with bathing/dressing/bathroom;Assistance with cooking/housework;Assist for transportation;Help with stairs or ramp for entrance   Equipment Recommendations  None recommended by OT    Recommendations for Other Services PT consult    Precautions / Restrictions Precautions Precautions: Fall Precaution Comments: limited L shld ROM Restrictions Weight Bearing Restrictions: Yes LLE Weight Bearing: Weight bearing as tolerated       Mobility Bed Mobility Overal bed mobility: Needs Assistance Bed Mobility: Supine to Sit     Supine to sit: Mod assist, HOB elevated     General bed mobility comments: uses leg strap to move LLE to EOB     Transfers Overall transfer level: Needs assistance Equipment used: Rolling walker (2 wheels) Transfers: Sit to/from Stand Sit to Stand: Min assist     Step pivot transfers: Min assist     General transfer comment: cues for hand placement/safety     Balance Overall balance assessment: Needs assistance Sitting-balance support: Single extremity supported Sitting balance-Leahy Scale: Fair     Standing balance support: Bilateral upper extremity supported, Reliant on assistive device for balance Standing balance-Leahy Scale: Poor Standing balance comment: Stood at EOB to use the urinal; leaned back against the bed, and braced with LEs for steadiness; RW support and mostly minA for safety/RW mgmt                           ADL either performed or assessed with clinical judgement   ADL   Eating/Feeding: Independent;Bed level;Sitting   Grooming: Set up;Sitting;Wash/dry hands;Wash/dry face;Oral care   Upper Body Bathing: Minimal assistance;Sitting   Lower Body Bathing: Moderate assistance;Sitting/lateral leans;Sit to/from stand   Upper Body Dressing : Minimal assistance;Sitting Upper Body Dressing Details (indicate cue type and reason): Min A to pull shirt down Lower Body Dressing: Moderate assistance;Sit to/from stand Lower Body Dressing Details (indicate cue type and reason): mod A to don pants/undergarments, pt able to help pull up once in standing. max A for socks/shoes Toilet Transfer: Minimal assistance;Rolling walker (2 wheels);Stand-pivot;Regular Glass blower/designer Details (indicate cue type and reason): simulated to chair Toileting- Clothing Manipulation and Hygiene: Min guard Toileting - Clothing Manipulation Details (indicate cue type and reason): braces legs against back of bed for stability while using urinal     Functional mobility during ADLs: Minimal  assistance      Extremity/Trunk Assessment Upper Extremity Assessment Upper Extremity  Assessment: LUE deficits/detail LUE Deficits / Details: long standing shoulder limitations LUE Coordination: decreased gross motor   Lower Extremity Assessment Lower Extremity Assessment: Defer to PT evaluation        Vision   Vision Assessment?: No apparent visual deficits   Perception Perception Perception: Not tested   Praxis Praxis Praxis: Not tested    Cognition Arousal/Alertness: Awake/alert Behavior During Therapy: WFL for tasks assessed/performed Overall Cognitive Status: Within Functional Limits for tasks assessed                                          Exercises      Shoulder Instructions       General Comments provided education on compensatory dressing strategies, pt requires increased physical assist    Pertinent Vitals/ Pain       Pain Assessment Pain Assessment: Faces Pain Score: 2  Faces Pain Scale: Hurts a little bit Pain Location: L hip Pain Descriptors / Indicators: Guarding, Grimacing, Discomfort, Burning Pain Intervention(s): Limited activity within patient's tolerance, Monitored during session, Repositioned  Home Living                                          Prior Functioning/Environment              Frequency  Min 3X/week        Progress Toward Goals  OT Goals(current goals can now be found in the care plan section)  Progress towards OT goals: Progressing toward goals  Acute Rehab OT Goals OT Goal Formulation: With patient Time For Goal Achievement: 11/15/21 Potential to Achieve Goals: Good ADL Goals Pt Will Perform Grooming: with min guard assist Pt Will Perform Lower Body Bathing: with min assist;with adaptive equipment;sit to/from stand Pt Will Perform Lower Body Dressing: with min assist;sit to/from stand;with adaptive equipment Pt Will Transfer to Toilet: with min guard assist;bedside commode;ambulating Pt Will Perform Toileting - Clothing Manipulation and hygiene: with min  guard assist;sit to/from stand Additional ADL Goal #1: Pt will perform bed mobility with min assist in preparation for ADL.  Plan Discharge plan remains appropriate;Frequency remains appropriate    Co-evaluation                 AM-PAC OT "6 Clicks" Daily Activity     Outcome Measure   Help from another person eating meals?: None Help from another person taking care of personal grooming?: A Little Help from another person toileting, which includes using toliet, bedpan, or urinal?: A Lot Help from another person bathing (including washing, rinsing, drying)?: A Lot Help from another person to put on and taking off regular upper body clothing?: A Little Help from another person to put on and taking off regular lower body clothing?: Total 6 Click Score: 15    End of Session Equipment Utilized During Treatment: Gait belt;Rolling walker (2 wheels)  OT Visit Diagnosis: Unsteadiness on feet (R26.81);Other abnormalities of gait and mobility (R26.89);Pain;Muscle weakness (generalized) (M62.81) Pain - Right/Left: Left Pain - part of body: Hip   Activity Tolerance Patient tolerated treatment well   Patient Left in chair;with call bell/phone within reach;with chair alarm set   Nurse Communication Mobility status  Time: 0829-0909 OT Time Calculation (min): 40 min  Charges: OT General Charges $OT Visit: 1 Visit OT Treatments $Self Care/Home Management : 38-52 mins  Lynnda Child, OTD, OTR/L Acute Rehab [phone] - [place] 11/04/2021, 9:16 AM

## 2021-11-05 ENCOUNTER — Encounter (HOSPITAL_COMMUNITY): Payer: Self-pay | Admitting: Orthopaedic Surgery

## 2021-11-05 ENCOUNTER — Telehealth: Payer: Self-pay | Admitting: *Deleted

## 2021-11-05 DIAGNOSIS — K5909 Other constipation: Secondary | ICD-10-CM | POA: Diagnosis not present

## 2021-11-05 DIAGNOSIS — E46 Unspecified protein-calorie malnutrition: Secondary | ICD-10-CM | POA: Diagnosis not present

## 2021-11-05 DIAGNOSIS — J449 Chronic obstructive pulmonary disease, unspecified: Secondary | ICD-10-CM | POA: Diagnosis not present

## 2021-11-05 DIAGNOSIS — E1142 Type 2 diabetes mellitus with diabetic polyneuropathy: Secondary | ICD-10-CM | POA: Diagnosis not present

## 2021-11-05 DIAGNOSIS — M159 Polyosteoarthritis, unspecified: Secondary | ICD-10-CM | POA: Diagnosis not present

## 2021-11-05 DIAGNOSIS — M609 Myositis, unspecified: Secondary | ICD-10-CM | POA: Diagnosis not present

## 2021-11-05 DIAGNOSIS — I480 Paroxysmal atrial fibrillation: Secondary | ICD-10-CM | POA: Diagnosis not present

## 2021-11-05 DIAGNOSIS — H9190 Unspecified hearing loss, unspecified ear: Secondary | ICD-10-CM | POA: Diagnosis not present

## 2021-11-05 DIAGNOSIS — F32A Depression, unspecified: Secondary | ICD-10-CM | POA: Diagnosis not present

## 2021-11-05 DIAGNOSIS — L219 Seborrheic dermatitis, unspecified: Secondary | ICD-10-CM | POA: Diagnosis not present

## 2021-11-05 DIAGNOSIS — Z96643 Presence of artificial hip joint, bilateral: Secondary | ICD-10-CM | POA: Diagnosis not present

## 2021-11-05 DIAGNOSIS — I10 Essential (primary) hypertension: Secondary | ICD-10-CM | POA: Diagnosis not present

## 2021-11-05 DIAGNOSIS — D6859 Other primary thrombophilia: Secondary | ICD-10-CM | POA: Diagnosis not present

## 2021-11-05 DIAGNOSIS — E871 Hypo-osmolality and hyponatremia: Secondary | ICD-10-CM | POA: Diagnosis not present

## 2021-11-05 DIAGNOSIS — G4700 Insomnia, unspecified: Secondary | ICD-10-CM | POA: Diagnosis not present

## 2021-11-05 DIAGNOSIS — E785 Hyperlipidemia, unspecified: Secondary | ICD-10-CM | POA: Diagnosis not present

## 2021-11-05 DIAGNOSIS — Z6836 Body mass index (BMI) 36.0-36.9, adult: Secondary | ICD-10-CM | POA: Diagnosis not present

## 2021-11-05 DIAGNOSIS — M791 Myalgia, unspecified site: Secondary | ICD-10-CM | POA: Diagnosis not present

## 2021-11-05 DIAGNOSIS — E669 Obesity, unspecified: Secondary | ICD-10-CM | POA: Diagnosis not present

## 2021-11-05 DIAGNOSIS — I4510 Unspecified right bundle-branch block: Secondary | ICD-10-CM | POA: Diagnosis not present

## 2021-11-05 DIAGNOSIS — N401 Enlarged prostate with lower urinary tract symptoms: Secondary | ICD-10-CM | POA: Diagnosis not present

## 2021-11-05 DIAGNOSIS — D649 Anemia, unspecified: Secondary | ICD-10-CM | POA: Diagnosis not present

## 2021-11-05 DIAGNOSIS — Z471 Aftercare following joint replacement surgery: Secondary | ICD-10-CM | POA: Diagnosis not present

## 2021-11-05 DIAGNOSIS — L309 Dermatitis, unspecified: Secondary | ICD-10-CM | POA: Diagnosis not present

## 2021-11-05 NOTE — Telephone Encounter (Signed)
D/C call to patient. Updated wife that CM and [place] with [place] have left VMs but haven't spoken yet regarding PT/INR this week. HHPT there today for therapy and skilled nursing to come out for PT/INR draw on Thursday. Will update with results.

## 2021-11-05 NOTE — Telephone Encounter (Signed)
Received call back from [place] with Dr. Raul Del office stating that patient's PT/INR when drawn can be faxed to her at [phone]- Attention [facility]. Updated [facility] liaison.

## 2021-11-06 ENCOUNTER — Telehealth: Payer: Self-pay | Admitting: Radiology

## 2021-11-06 ENCOUNTER — Encounter (HOSPITAL_COMMUNITY): Payer: Self-pay | Admitting: Orthopaedic Surgery

## 2021-11-06 NOTE — Telephone Encounter (Signed)
Patient's wife called triage line. Patient is status post left THA on 10/31/2021. About 1/2 hour ago started noticing numbness and heaviness in his left leg. This is a new symptom. He took his pain medication at 11am, and has done his walking around which is about 40 ft round trip. There does not appear to be any drainage from bandage.  Patient is concerned because the leg feels so heavy.    Please advise. CB [phone]

## 2021-11-06 NOTE — Telephone Encounter (Signed)
Patient wife aware of below message

## 2021-11-08 ENCOUNTER — Telehealth: Payer: Self-pay | Admitting: *Deleted

## 2021-11-08 NOTE — Telephone Encounter (Signed)
Ortho bundle 7 day call completed. 

## 2021-11-09 ENCOUNTER — Emergency Department (HOSPITAL_COMMUNITY): Payer: PPO

## 2021-11-09 ENCOUNTER — Encounter (HOSPITAL_COMMUNITY): Payer: Self-pay

## 2021-11-09 ENCOUNTER — Observation Stay (HOSPITAL_COMMUNITY)
Admission: EM | Admit: 2021-11-09 | Discharge: 2021-11-11 | Disposition: A | Discharge status: Home Health | Payer: PPO | Attending: Internal Medicine | Admitting: Internal Medicine

## 2021-11-09 ENCOUNTER — Other Ambulatory Visit: Payer: Self-pay

## 2021-11-09 DIAGNOSIS — I959 Hypotension, unspecified: Secondary | ICD-10-CM | POA: Diagnosis not present

## 2021-11-09 DIAGNOSIS — N3289 Other specified disorders of bladder: Secondary | ICD-10-CM | POA: Diagnosis not present

## 2021-11-09 DIAGNOSIS — E119 Type 2 diabetes mellitus without complications: Secondary | ICD-10-CM | POA: Diagnosis not present

## 2021-11-09 DIAGNOSIS — Z85828 Personal history of other malignant neoplasm of skin: Secondary | ICD-10-CM | POA: Diagnosis not present

## 2021-11-09 DIAGNOSIS — D649 Anemia, unspecified: Secondary | ICD-10-CM | POA: Diagnosis not present

## 2021-11-09 DIAGNOSIS — R0689 Other abnormalities of breathing: Secondary | ICD-10-CM | POA: Diagnosis not present

## 2021-11-09 DIAGNOSIS — Z86711 Personal history of pulmonary embolism: Secondary | ICD-10-CM | POA: Insufficient documentation

## 2021-11-09 DIAGNOSIS — I517 Cardiomegaly: Secondary | ICD-10-CM | POA: Diagnosis not present

## 2021-11-09 DIAGNOSIS — Z96642 Presence of left artificial hip joint: Secondary | ICD-10-CM

## 2021-11-09 DIAGNOSIS — Z86718 Personal history of other venous thrombosis and embolism: Secondary | ICD-10-CM | POA: Insufficient documentation

## 2021-11-09 DIAGNOSIS — R0600 Dyspnea, unspecified: Secondary | ICD-10-CM

## 2021-11-09 DIAGNOSIS — J449 Chronic obstructive pulmonary disease, unspecified: Secondary | ICD-10-CM | POA: Insufficient documentation

## 2021-11-09 DIAGNOSIS — Z20822 Contact with and (suspected) exposure to covid-19: Secondary | ICD-10-CM | POA: Insufficient documentation

## 2021-11-09 DIAGNOSIS — S329XXA Fracture of unspecified parts of lumbosacral spine and pelvis, initial encounter for closed fracture: Secondary | ICD-10-CM | POA: Diagnosis not present

## 2021-11-09 DIAGNOSIS — Z96643 Presence of artificial hip joint, bilateral: Secondary | ICD-10-CM | POA: Diagnosis not present

## 2021-11-09 DIAGNOSIS — I480 Paroxysmal atrial fibrillation: Secondary | ICD-10-CM | POA: Insufficient documentation

## 2021-11-09 DIAGNOSIS — I10 Essential (primary) hypertension: Secondary | ICD-10-CM | POA: Insufficient documentation

## 2021-11-09 DIAGNOSIS — Z7984 Long term (current) use of oral hypoglycemic drugs: Secondary | ICD-10-CM | POA: Diagnosis not present

## 2021-11-09 DIAGNOSIS — T8182XA Emphysema (subcutaneous) resulting from a procedure, initial encounter: Secondary | ICD-10-CM | POA: Diagnosis not present

## 2021-11-09 DIAGNOSIS — Z8546 Personal history of malignant neoplasm of prostate: Secondary | ICD-10-CM | POA: Insufficient documentation

## 2021-11-09 DIAGNOSIS — Z87891 Personal history of nicotine dependence: Secondary | ICD-10-CM | POA: Diagnosis not present

## 2021-11-09 DIAGNOSIS — R0602 Shortness of breath: Secondary | ICD-10-CM | POA: Diagnosis not present

## 2021-11-09 DIAGNOSIS — E785 Hyperlipidemia, unspecified: Secondary | ICD-10-CM | POA: Diagnosis present

## 2021-11-09 DIAGNOSIS — Z7901 Long term (current) use of anticoagulants: Secondary | ICD-10-CM | POA: Diagnosis not present

## 2021-11-09 DIAGNOSIS — Z96652 Presence of left artificial knee joint: Secondary | ICD-10-CM | POA: Diagnosis not present

## 2021-11-09 DIAGNOSIS — R0609 Other forms of dyspnea: Secondary | ICD-10-CM | POA: Diagnosis present

## 2021-11-09 DIAGNOSIS — D6859 Other primary thrombophilia: Secondary | ICD-10-CM | POA: Diagnosis present

## 2021-11-09 DIAGNOSIS — R531 Weakness: Secondary | ICD-10-CM

## 2021-11-09 DIAGNOSIS — N400 Enlarged prostate without lower urinary tract symptoms: Secondary | ICD-10-CM

## 2021-11-09 DIAGNOSIS — R0682 Tachypnea, not elsewhere classified: Secondary | ICD-10-CM | POA: Diagnosis not present

## 2021-11-09 LAB — HEMOGLOBIN AND HEMATOCRIT, BLOOD
HCT: 29 % — ABNORMAL LOW (ref 39.0–52.0)
Hemoglobin: 9 g/dL — ABNORMAL LOW (ref 13.0–17.0)

## 2021-11-09 LAB — CBC WITH DIFFERENTIAL/PLATELET
Abs Immature Granulocytes: 0.16 10*3/uL — ABNORMAL HIGH (ref 0.00–0.07)
Basophils Absolute: 0.1 10*3/uL (ref 0.0–0.1)
Basophils Relative: 1 %
Eosinophils Absolute: 0.1 10*3/uL (ref 0.0–0.5)
Eosinophils Relative: 1 %
HCT: 29.6 % — ABNORMAL LOW (ref 39.0–52.0)
Hemoglobin: 9.4 g/dL — ABNORMAL LOW (ref 13.0–17.0)
Immature Granulocytes: 2 %
Lymphocytes Relative: 12 %
Lymphs Abs: 1.1 10*3/uL (ref 0.7–4.0)
MCH: 27.6 pg (ref 26.0–34.0)
MCHC: 31.8 g/dL (ref 30.0–36.0)
MCV: 86.8 fL (ref 80.0–100.0)
Monocytes Absolute: 0.8 10*3/uL (ref 0.1–1.0)
Monocytes Relative: 9 %
Neutro Abs: 6.8 10*3/uL (ref 1.7–7.7)
Neutrophils Relative %: 75 %
Platelets: 467 10*3/uL — ABNORMAL HIGH (ref 150–400)
RBC: 3.41 MIL/uL — ABNORMAL LOW (ref 4.22–5.81)
RDW: 14.3 % (ref 11.5–15.5)
WBC: 9 10*3/uL (ref 4.0–10.5)
nRBC: 0 % (ref 0.0–0.2)

## 2021-11-09 LAB — POC OCCULT BLOOD, ED: Fecal Occult Bld: NEGATIVE

## 2021-11-09 LAB — PROTIME-INR
INR: 2 — ABNORMAL HIGH (ref 0.8–1.2)
Prothrombin Time: 22.8 seconds — ABNORMAL HIGH (ref 11.4–15.2)

## 2021-11-09 LAB — BASIC METABOLIC PANEL
Anion gap: 9 (ref 5–15)
BUN: 20 mg/dL (ref 8–23)
CO2: 23 mmol/L (ref 22–32)
Calcium: 8.9 mg/dL (ref 8.9–10.3)
Chloride: 101 mmol/L (ref 98–111)
Creatinine, Ser: 1.12 mg/dL (ref 0.61–1.24)
GFR, Estimated: >60 mL/min (ref >60)
Glucose, Bld: 113 mg/dL — ABNORMAL HIGH (ref 70–99)
Potassium: 4.3 mmol/L (ref 3.5–5.1)
Sodium: 133 mmol/L — ABNORMAL LOW (ref 135–145)

## 2021-11-09 LAB — TROPONIN I (HIGH SENSITIVITY)
Troponin I (High Sensitivity): 13 ng/L (ref <18)
Troponin I (High Sensitivity): 14 ng/L (ref <18)

## 2021-11-09 LAB — RESP PANEL BY RT-PCR (FLU A&B, COVID) ARPGX2
Influenza A by PCR: NEGATIVE
Influenza B by PCR: NEGATIVE
SARS Coronavirus 2 by RT PCR: NEGATIVE

## 2021-11-09 LAB — BRAIN NATRIURETIC PEPTIDE: B Natriuretic Peptide: 50.1 pg/mL (ref 0.0–100.0)

## 2021-11-09 MED ORDER — MORPHINE SULFATE (PF) 4 MG/ML IV SOLN
4.0000 mg | Freq: Once | INTRAVENOUS | Status: AC
  Administered 2021-11-09: 4 mg via INTRAVENOUS
  Filled 2021-11-09: qty 1

## 2021-11-09 MED ORDER — SODIUM CHLORIDE 0.9 % IV SOLN
10.0000 mL/h | Freq: Once | INTRAVENOUS | Status: DC
Start: 2021-11-09 — End: 2021-11-10

## 2021-11-09 MED ORDER — LORAZEPAM 2 MG/ML IJ SOLN: 1.0000 mg | Freq: Once | INTRAMUSCULAR | Status: DC | PRN

## 2021-11-09 MED ORDER — IOHEXOL 350 MG/ML SOLN
80.0000 mL | Freq: Once | INTRAVENOUS | Status: AC | PRN
  Administered 2021-11-09: 80 mL via INTRAVENOUS

## 2021-11-09 NOTE — ED Notes (Signed)
Lab requesting type and screen for transfusion.

## 2021-11-09 NOTE — ED Provider Notes (Signed)
[facility] EMERGENCY DEPARTMENT Provider Note   CSN: [id number] Arrival date & time: 11/09/21  1238     History  Chief Complaint  Patient presents with   Shortness of Breath    Colton Miranda is a 81 y.o. male.   Shortness of Breath Associated symptoms: no chest pain   Patient presents with shortness of breath.  Recent hip surgery/replacement on the ninth.  Discharge 5 days ago.  States more shortness of breath today.  States he got up and walked to the bathroom.  States became much more short of breath.  States when he tried to get up and move around also had more shortness of breath.  No chest pain.  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  No fevers or chills.  No coughing.  Also has had previous anemia requiring transfusion after a left knee replacement.  Denies any bleeding.    Home Medications Prior to Admission medications   Medication Sig Start Date End Date Taking? Authorizing Provider  acetaminophen (TYLENOL) 325 MG tablet Take 325 mg by mouth every 6 (six) hours as needed for moderate pain.    [provider]  Blood Glucose Monitoring Suppl (ONETOUCH VERIO) w/Device KIT by Does not apply route. Check blood sugar twice daily as directed DX E11.65    [provider]  Cholecalciferol (VITAMIN D3) 50 MCG (2000 UT) TABS Take 2,000 Units by mouth daily.     [provider]  diphenhydrAMINE HCl, Sleep, (SOMINEX PO) Take 50 mg by mouth at bedtime.    [provider]  docusate sodium (COLACE) 100 MG capsule Take 1 capsule by mouth as needed for constipation.    [provider]  HYDROcodone-acetaminophen (NORCO/VICODIN) 5-325 MG tablet Take 1-2 tablets by mouth every 6 (six) hours as needed for moderate pain (pain score 4-6). 11/01/21   Mcarthur Rossetti, MD  ketoconazole (NIZORAL) 2 % cream Apply 1 application topically daily.    [provider]  lisinopril  (PRINIVIL,ZESTRIL) 20 MG tablet Take 1 tablet (20 mg total) by mouth daily. 03/30/18   Lauree Chandler, NP  metFORMIN (GLUCOPHAGE) 500 MG tablet TAKE 1 TABLET BY MOUTH ONCE DAILY WITH BREAKFAST 01/25/18   Lauree Chandler, NP  NYSTATIN powder Apply 1 application topically 2 (two) times daily as needed for irritation. 06/04/21   [provider]  Jonetta Speak LANCETS 18Z MISC USE 1  TO CHECK GLUCOSE TWICE DAILY Patient taking differently: 2 (two) times daily. 03/02/18   Lauree Chandler, NP  ONETOUCH VERIO test strip USE 1 STRIP TO CHECK GLUCOSE TWICE DAILY AS DIRECTED Patient taking differently: 2 (two) times daily. 03/02/18   Lauree Chandler, NP  simvastatin (ZOCOR) 20 MG tablet TAKE 1 TABLET BY MOUTH ONCE DAILY FOR CHOLESTEROL Patient taking differently: Take 20 mg by mouth daily. 03/02/18   Lauree Chandler, NP  traMADol (ULTRAM) 50 MG tablet Take one to two tablets by mouth every 6 hours as needed for pain. Do not take more than 6 tablets in 24 hours 02/17/20   Antonieta Pert, MD  warfarin (COUMADIN) 10 MG tablet TAKE 1 TABLET BY MOUTH ONCE DAILY ALONG  WITH  A  5  MG  TABLET  FOR  A  TOTAL  OF  15  MG  DAILY Patient taking differently: Take 10 mg by mouth daily. 04/14/18   Hendricks Limes, MD  warfarin (COUMADIN) 5 MG tablet Take  5-7.5 mg by mouth See admin instructions. Every day take 1 (5 mg) tablet ALONG with a 10 mg tablet (total=15 mg). THEN on Monday's take a 1 and a half (7.5 mg) tablets ALONG with the 10 mg tablet (total=17.5 mg).    [provider]      Allergies    Patient has no known allergies.    Review of Systems   Review of Systems  Respiratory:  Positive for shortness of breath.   Cardiovascular:  Negative for chest pain.  Musculoskeletal:  Negative for back pain.  Neurological:  Positive for weakness.   Physical Exam Updated Vital Signs BP (!) 142/62    Pulse 78    Temp 98.1 F (36.7 C)    Resp 14    Ht 5' 6" (1.676 m)    Wt 103.4 kg    SpO2 98%     BMI 36.80 kg/m  Physical Exam Vitals and nursing note reviewed.  HENT:     Head: Atraumatic.  Cardiovascular:     Rate and Rhythm: Regular rhythm.  Pulmonary:     Breath sounds: No wheezing, rhonchi or rales.  Chest:     Chest wall: No mass.  Abdominal:     Tenderness: There is no abdominal tenderness.  Musculoskeletal:     Comments: Some edema of the left lower extremity.  Appears somewhat chronic.  Neurological:     Mental Status: He is alert.    ED Results / Procedures / Treatments   Labs (all labs ordered are listed, but only abnormal results are displayed) Labs Reviewed  PROTIME-INR - Abnormal; Notable for the following components:      Result Value   Prothrombin Time 22.8 (*)    INR 2.0 (*)    All other components within normal limits  CBC WITH DIFFERENTIAL/PLATELET - Abnormal; Notable for the following components:   RBC 3.41 (*)    Hemoglobin 9.4 (*)    HCT 29.6 (*)    Platelets 467 (*)    Abs Immature Granulocytes 0.16 (*)    All other components within normal limits  BASIC METABOLIC PANEL - Abnormal; Notable for the following components:   Sodium 133 (*)    Glucose, Bld 113 (*)    All other components within normal limits  RESP PANEL BY RT-PCR (FLU A&B, COVID) ARPGX2  BRAIN NATRIURETIC PEPTIDE  TROPONIN I (HIGH SENSITIVITY)    EKG EKG Interpretation  Date/Time:  Saturday November 09 2021 12:43:55 EST Ventricular Rate:  76 PR Interval:  132 QRS Duration: 142 QT Interval:  385 QTC Calculation: 433 R Axis:   -34 Text Interpretation: Sinus rhythm Supraventricular bigeminy Right bundle branch block Confirmed by Octaviano Glow [phone]) on 11/09/2021 3:07:21 PM  Radiology DG Chest Portable 1 View  Result Date: 11/09/2021 CLINICAL DATA:  Shortness of breath.  Recent hip surgery. EXAM: PORTABLE CHEST 1 VIEW COMPARISON:  02/13/2020 FINDINGS: Heart size and mediastinal contours appear normal. Lung volumes are low. There is no pleural effusion or edema. No  airspace opacities. Advanced degenerative changes are noted within both glenohumeral joints. IMPRESSION: Low lung volumes.  No acute findings. Electronically Signed   By: Kerby Moors M.D.   On: 11/09/2021 13:34    Procedures Procedures    Medications Ordered in ED Medications - No data to display  ED Course/ Medical Decision Making/ A&P  Medical Decision Making Problems Addressed: Anemia, unspecified type: acute illness or injury  Amount and/or Complexity of Data Reviewed External Data Reviewed: notes.    Details: Recent discharge summary Labs: ordered. Decision-making details documented in ED Course. Radiology: ordered and independent interpretation performed. Decision-making details documented in ED Course. ECG/medicine tests: independent interpretation performed. Decision-making details documented in ED Course.   Patient presents with shortness of breath.  Worse after exertion today.  History of recent left hip replacement.  He is on blood thinners due to protein C&S deficiency.  Chest x-ray independently interpreted by me and reassuring.  Has a mild decrease in his hemoglobin from 10.1 down to 9.4.  Potentially could be somewhat contributing to this.  However with history of pulmonary embolism will get CT angiography.  Care turned over to Dr. Langston Masker        Final Clinical Impression(s) / ED Diagnoses Final diagnoses:  Dyspnea, unspecified type  Anemia, unspecified type    Rx / DC Orders ED Discharge Orders     None         Davonna Belling, MD 11/09/21 1512

## 2021-11-09 NOTE — Assessment & Plan Note (Addendum)
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 -Patient has already taken warfarin today and INR 2.0.  If hemoglobin remains stable on morning labs, resume warfarin.

## 2021-11-09 NOTE — ED Provider Notes (Signed)
81 yo male here with dyspnea   Pending CT PE  -No acute PE or pneumonia noted on CT scan per my review of these images.  Subsequently patient was reassessed and his daughter at the bedside, and his wife as well.  We discussed his hemoglobin levels which were mildly downtrending we agreed to repeat his hemoglobin.  The hemoglobin did drop from 9.4 to 9.0.  His Hemoccult test was negative.  He is on Coumadin with an INR of 2.0.  We sent him back for CT scan of the pelvis to evaluate for postoperative bleeding site or hematoma, there is no evidence of large blood collection postoperatively.  Last colonoscopy was in 2018 with [facility] which found multiple polyps in the colon, large nonbleeding internal hemorrhoids.  At this time we will admit him for anemia, unclear origin.   1135 pm - patient admitted to the hospitalist.  The hospitalist recommended that we transfuse 1 unit of blood given that there is some concern for possible symptomatic anemia.  The patient was consented for unit blood transfusion and agreeable to the transfusion.  We will continue to monitor him overnight.  1 mg as needed Ativan ordered for sleep and restlessness that he is uncomfortable in the room.  Morphine did help somewhat with his pain.  .Critical Care Performed by: Wyvonnia Dusky, MD Authorized by: Wyvonnia Dusky, MD   Critical care provider statement:    Critical care time (minutes):  45   Critical care time was exclusive of:  Separately billable procedures and treating other patients   Critical care was necessary to treat or prevent imminent or life-threatening deterioration of the following conditions:  Circulatory failure   Critical care was time spent personally by me on the following activities:  Ordering and performing treatments and interventions, ordering and review of laboratory studies, ordering and review of radiographic studies, pulse oximetry, review of old charts, examination of patient and  evaluation of patient's response to treatment   Care discussed with: admitting provider   Comments:     Transfusion, pRBC    Wyvonnia Dusky, MD 11/09/21 2337

## 2021-11-09 NOTE — Subjective & Objective (Addendum)
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  Vital signs stable.  Labs showing no leukocytosis.  Hemoglobin down trended from 9.4 to 9.0, was 10.1 on labs done a week ago.  INR 2.0.  FOBT negative.  COVID and flu negative.  High-sensitivity troponin negative x2.  BNP normal.  CT angiogram chest negative for PE or any other acute findings.  CT pelvis showing expected postop changes of recent total left hip arthroplasty with no hematoma or source of bleeding identified. 1 unit PRBCs ordered for symptomatic anemia.  Patient states he is recovering from his recent hip surgery.  This morning his wife was helping him take a sponge bath when he felt very weak and had difficulty breathing.  States his breathing is better now.  Denies lightheadedness/dizziness or chest pain.  Denies fevers, chills, cough, nausea, vomiting, abdominal pain, diarrhea, or dysuria.  Denies hematemesis, hematochezia, or melena.

## 2021-11-09 NOTE — ED Triage Notes (Signed)
Pt arrives via EMS from home. Pt had left hip surgery on the 9th, discharged on Monday. Pt c/o sob that worsens with exertion that started today. Hx of PE. Pt is currently on coumadin.

## 2021-11-09 NOTE — ED Notes (Signed)
Patient transported to CT 

## 2021-11-10 ENCOUNTER — Encounter (HOSPITAL_COMMUNITY): Payer: Self-pay | Admitting: Internal Medicine

## 2021-11-10 DIAGNOSIS — D649 Anemia, unspecified: Secondary | ICD-10-CM

## 2021-11-10 DIAGNOSIS — R0609 Other forms of dyspnea: Secondary | ICD-10-CM | POA: Diagnosis present

## 2021-11-10 DIAGNOSIS — R531 Weakness: Secondary | ICD-10-CM

## 2021-11-10 LAB — GLUCOSE, CAPILLARY
Glucose-Capillary: 133 mg/dL — ABNORMAL HIGH (ref 70–99)
Glucose-Capillary: 121 mg/dL — ABNORMAL HIGH (ref 70–99)
Glucose-Capillary: 137 mg/dL — ABNORMAL HIGH (ref 70–99)
Glucose-Capillary: 140 mg/dL — ABNORMAL HIGH (ref 70–99)

## 2021-11-10 LAB — CBC
HCT: 31.1 % — ABNORMAL LOW (ref 39.0–52.0)
Hemoglobin: 10.2 g/dL — ABNORMAL LOW (ref 13.0–17.0)
MCH: 27.9 pg (ref 26.0–34.0)
MCHC: 32.8 g/dL (ref 30.0–36.0)
MCV: 85 fL (ref 80.0–100.0)
Platelets: 456 10*3/uL — ABNORMAL HIGH (ref 150–400)
RBC: 3.66 MIL/uL — ABNORMAL LOW (ref 4.22–5.81)
RDW: 14.2 % (ref 11.5–15.5)
WBC: 10.8 10*3/uL — ABNORMAL HIGH (ref 4.0–10.5)
nRBC: 0 % (ref 0.0–0.2)

## 2021-11-10 LAB — PROTIME-INR
INR: 1.9 — ABNORMAL HIGH (ref 0.8–1.2)
Prothrombin Time: 21.3 seconds — ABNORMAL HIGH (ref 11.4–15.2)

## 2021-11-10 LAB — PREPARE RBC (CROSSMATCH)

## 2021-11-10 MED ORDER — WARFARIN - PHARMACIST DOSING INPATIENT
Freq: Every day | Status: DC
Start: 2021-11-10 — End: 2021-11-11

## 2021-11-10 MED ORDER — BISACODYL 10 MG RE SUPP
10.0000 mg | Freq: Every day | RECTAL | Status: DC | PRN
  Filled 2021-11-10: qty 1

## 2021-11-10 MED ORDER — IPRATROPIUM-ALBUTEROL 0.5-2.5 (3) MG/3ML IN SOLN: 3.0000 mL | Freq: Four times a day (QID) | RESPIRATORY_TRACT | Status: DC | PRN

## 2021-11-10 MED ORDER — INSULIN ASPART 100 UNIT/ML IJ SOLN: 0.0000 [IU] | Freq: Every day | INTRAMUSCULAR | Status: DC

## 2021-11-10 MED ORDER — SIMVASTATIN 20 MG PO TABS
20.0000 mg | ORAL_TABLET | Freq: Every day | ORAL | Status: DC
  Administered 2021-11-10: 20 mg via ORAL
  Filled 2021-11-10: qty 1

## 2021-11-10 MED ORDER — POLYETHYLENE GLYCOL 3350 17 G PO PACK
17.0000 g | PACK | Freq: Two times a day (BID) | ORAL | Status: DC
  Administered 2021-11-10 – 2021-11-11 (×3): 17 g via ORAL
  Filled 2021-11-10 (×3): qty 1

## 2021-11-10 MED ORDER — ACETAMINOPHEN 325 MG PO TABS
650.0000 mg | ORAL_TABLET | Freq: Four times a day (QID) | ORAL | Status: DC | PRN
  Administered 2021-11-10: 650 mg via ORAL
  Filled 2021-11-10: qty 2

## 2021-11-10 MED ORDER — ACETAMINOPHEN 650 MG RE SUPP: 650.0000 mg | Freq: Four times a day (QID) | RECTAL | Status: DC | PRN

## 2021-11-10 MED ORDER — LACTULOSE 10 GM/15ML PO SOLN: 20.0000 g | Freq: Two times a day (BID) | ORAL | Status: DC | PRN

## 2021-11-10 MED ORDER — INSULIN ASPART 100 UNIT/ML IJ SOLN: 0.0000 [IU] | Freq: Three times a day (TID) | INTRAMUSCULAR | Status: DC

## 2021-11-10 MED ORDER — BETHANECHOL CHLORIDE 10 MG PO TABS
10.0000 mg | ORAL_TABLET | Freq: Three times a day (TID) | ORAL | Status: DC
Start: 2021-11-10 — End: 2021-11-11
  Administered 2021-11-10 – 2021-11-11 (×2): 10 mg via ORAL
  Filled 2021-11-10 (×4): qty 1

## 2021-11-10 MED ORDER — LISINOPRIL 20 MG PO TABS
20.0000 mg | ORAL_TABLET | Freq: Every day | ORAL | Status: DC
  Administered 2021-11-10 – 2021-11-11 (×2): 20 mg via ORAL
  Filled 2021-11-10 (×2): qty 1

## 2021-11-10 MED ORDER — TAMSULOSIN HCL 0.4 MG PO CAPS
0.4000 mg | ORAL_CAPSULE | Freq: Every day | ORAL | Status: DC
Start: 2021-11-10 — End: 2021-11-11
  Administered 2021-11-10 – 2021-11-11 (×2): 0.4 mg via ORAL
  Filled 2021-11-10 (×2): qty 1

## 2021-11-10 MED ORDER — SENNOSIDES-DOCUSATE SODIUM 8.6-50 MG PO TABS
1.0000 | ORAL_TABLET | Freq: Two times a day (BID) | ORAL | Status: DC
  Administered 2021-11-10 – 2021-11-11 (×3): 1 via ORAL
  Filled 2021-11-10 (×3): qty 1

## 2021-11-10 MED ORDER — WARFARIN SODIUM 7.5 MG PO TABS
15.0000 mg | ORAL_TABLET | Freq: Once | ORAL | Status: AC
  Administered 2021-11-10: 15 mg via ORAL
  Filled 2021-11-10: qty 2

## 2021-11-10 MED ORDER — HYDROCODONE-ACETAMINOPHEN 5-325 MG PO TABS
1.0000 | ORAL_TABLET | Freq: Four times a day (QID) | ORAL | Status: DC | PRN
  Administered 2021-11-10 (×3): 1 via ORAL
  Filled 2021-11-10 (×3): qty 1

## 2021-11-10 NOTE — Assessment & Plan Note (Signed)
Stable findings on imaging. -Continue Norco as needed for pain.  PT/OT eval.

## 2021-11-10 NOTE — H&P (Signed)
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  Vital signs stable.  Labs showing no leukocytosis.  Hemoglobin down trended from 9.4 to 9.0, was 10.1 on labs done a week ago.  INR 2.0.  FOBT negative.  COVID and flu negative.  High-sensitivity troponin negative x2.  BNP normal.  CT angiogram chest negative for PE or any other acute findings.  CT pelvis showing expected postop changes of recent total left hip arthroplasty with no hematoma or source of bleeding identified. 1 unit PRBCs ordered for symptomatic anemia.  Patient states he is recovering from his recent hip surgery.  This morning his wife was helping him take a sponge bath when he felt very weak and had difficulty breathing.  States his breathing is better now.  Denies lightheadedness/dizziness or chest pain.  Denies fevers, chills, cough, nausea, vomiting, abdominal pain, diarrhea, or dysuria.  Denies hematemesis, hematochezia, or melena.   Review of Systems:  Review of Systems  All other systems reviewed and are negative.  Past Medical History:  Diagnosis Date   BPH associated with nocturia    Chronic constipation    COPD (chronic obstructive pulmonary disease) (HCC)    Depression    Eczema    Heart murmur    at birth   History of adenomatous polyp of colon    History of basal  cell carcinoma (BCC) of skin    post excision from trunk   History of CVA (cerebrovascular accident) without residual deficits    11-06-2017 per pt and wife "stroke was approx. 20 years ago , 1999, caused by blood clot after knee scope"  DVT to PE   History of depression    over 40 years ago nervous breakdown   History of DVT (deep vein thrombosis)    11-06-2017 per pt and wife happened lower leg approx. 1999 after knee scope   History of pulmonary embolus (PE)    11-06-2017 from DVT in approx. 1999 per wife and pt   HOH (hard of hearing)    Hyperlipidemia    Hypertension    followed by pcp   Long term (current) use of anticoagulants Coumadin-- followed by Pharmasist w/ [facility]   secondary to primary hypercoagulopathy w/ hx DVT and PE   Myalgia and myositis, unspecified    OA (osteoarthritis)    "all joints"   PAF (paroxysmal atrial fibrillation) ([place]) 2019   Peripheral neuropathy    Pinched nerve in neck    per pt causes intermittant numbness bilateral arms and hands   Primary hypercoagulable state ([place])    Prostate cancer [facility]) urologist-  dr Alyson Ingles  oncologist-- dr Tammi Klippel   dx 08-28-2017  Stage T1c, Gleason 4+4, PSA 14.1, vol 42.8cc-- planned treatment external radiation and ADT   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)    Type 2 diabetes mellitus ([place])    Urinary frequency    Wears glasses     Past Surgical History:  Procedure Laterality Date   CHOLECYSTECTOMY OPEN  1981   COLONOSCOPY  last one 01-28-2017   GOLD SEED IMPLANT N/A 11/16/2017   Procedure: GOLD SEED IMPLANT;  Surgeon: Cleon Gustin, MD;  Location: [facility];  Service: Urology;  Laterality: N/A;   KNEE ARTHROSCOPY Right 1990s   PILONIDAL CYST EXCISION  1968   PROSTATE BIOPSY  09/10/2017    (done in office)   + Pos for Prostate Cancer, Dr.MCkinzie( [facility])     SPACE OAR INSTILLATION N/A 11/16/2017   Procedure: SPACE OAR INSTILLATION;  Surgeon: Cleon Gustin, MD;  Location: [facility];  Service: Urology;  Laterality: N/A;   TONSILLECTOMY     TOTAL HIP ARTHROPLASTY Right 06-18-2009   dr Wynelle Link  [place] Left 10/31/2021   Procedure: LEFT TOTAL HIP ARTHROPLASTY ANTERIOR APPROACH;  Surgeon: Mcarthur Rossetti, MD;  Location: [facility];  Service: Orthopedics;  Laterality: Left;   TOTAL KNEE ARTHROPLASTY Left 02/10/2020   Procedure: TOTAL KNEE ARTHROPLASTY;  Surgeon: Sydnee Cabal, MD;  Location: [facility];  Service: Orthopedics;  Laterality: Left;  adductor canale     reports that he quit smoking about 12 years ago. His smoking use included cigarettes. He has a 25.00 pack-year smoking history. He has never used smokeless tobacco. He reports current alcohol use of about 3.0 standard drinks per week. He reports that he does not use drugs.  No Known Allergies  Family History  Problem Relation Age of Onset   Cancer Mother 13       colon , breast   Colon cancer Mother    Parkinson's disease Sister    Cancer Sister        breast   Cancer Maternal Uncle        melanoma   Cancer Paternal Uncle    Heart attack Daughter        2008    Prior to Admission medications   Medication Sig Start Date End Date Taking? Authorizing Provider  acetaminophen (TYLENOL) 325 MG tablet Take 325 mg by mouth every 6 (six) hours as needed for moderate pain.   Yes [provider]  Blood Glucose Monitoring Suppl (ONETOUCH VERIO) w/Device KIT by Does not apply route. Check blood sugar twice daily as directed DX E11.65   Yes [provider]  Cholecalciferol (VITAMIN D3) 50 MCG (2000 UT) TABS Take 2,000 Units by mouth daily.    Yes [provider]  docusate sodium (COLACE) 100 MG capsule Take 100 mg by mouth as needed for constipation.   Yes [provider]  HYDROcodone-acetaminophen (NORCO/VICODIN)  5-325 MG tablet Take 1-2 tablets by mouth every 6 (six) hours as needed for moderate pain (pain score 4-6). 11/01/21  Yes Mcarthur Rossetti, MD  ketoconazole (NIZORAL) 2 % cream Apply 1 application topically daily.   Yes [provider]  lisinopril (PRINIVIL,ZESTRIL) 20 MG tablet Take 1 tablet (20 mg total) by mouth daily. 03/30/18  Yes Lauree Chandler, NP  metFORMIN (GLUCOPHAGE) 500 MG tablet TAKE 1 TABLET BY MOUTH ONCE DAILY WITH BREAKFAST Patient taking differently: Take 500 mg by mouth daily with breakfast. 01/25/18  Yes Lauree Chandler, NP  NYSTATIN powder Apply 1 application topically 2 (two) times daily as needed for irritation. 06/04/21  Yes [provider]  [place] LANCETS 50I MISC USE 1  TO CHECK GLUCOSE TWICE DAILY Patient taking differently: 2 (two) times daily. 03/02/18  Yes Lauree Chandler, NP  simvastatin (ZOCOR) 20 MG tablet TAKE 1 TABLET BY MOUTH ONCE DAILY FOR CHOLESTEROL Patient taking differently: Take 20 mg by mouth daily. 03/02/18  Yes Lauree Chandler, NP  traMADol (ULTRAM) 50 MG tablet Take one to two tablets by mouth every 6 hours as needed for pain. Do not take more than 6 tablets in 24 hours Patient taking differently: Take 50 mg by mouth at bedtime as needed for moderate pain. 02/17/20  Yes Antonieta Pert, MD  warfarin (COUMADIN) 10 MG tablet TAKE 1 TABLET BY MOUTH ONCE DAILY ALONG  WITH  A  5  MG  TABLET  FOR  A  TOTAL  OF  15  MG  DAILY Patient taking differently: Take 10 mg by mouth daily. 04/14/18  Yes Hendricks Limes, MD  warfarin (COUMADIN) 5 MG tablet Take 5-7.5 mg by mouth See admin instructions. 5 mg on Tuesday,Wednesday,Thursday,Friday,Saturday and Sunday along with 10 mg to equal 57m 7.5 mg on Monday along with 10 mg to equal 17.5 mg   Yes [provider]  ONETOUCH VERIO test strip USE 1 STRIP TO CHECK GLUCOSE TWICE DAILY AS DIRECTED Patient taking differently: 2 (two) times daily. 03/02/18   ELauree Chandler NP     Physical Exam: Vitals:   11/09/21 2200 11/09/21 2215 11/09/21 2245 11/09/21 2330  BP: (!) 131/53 133/63 (!) 108/57 (!) 110/56  Pulse: 76 78 72 75  Resp: 18 (!) 23 11 14   Temp:      SpO2: 97% 96% 97% 99%  Weight:      Height:        Physical Exam Constitutional:      General: He is not in acute distress. HENT:     Head: Normocephalic and atraumatic.  Eyes:     Extraocular Movements: Extraocular movements intact.     Conjunctiva/sclera: Conjunctivae normal.  Cardiovascular:     Rate and Rhythm: Normal rate and regular rhythm.     Pulses: Normal pulses.  Pulmonary:     Effort: Pulmonary effort is normal. No respiratory distress.     Breath sounds: Normal breath sounds. No wheezing or rales.  Abdominal:     General: Bowel sounds are normal.     Palpations: Abdomen is soft.     Tenderness: There is no abdominal tenderness.  Musculoskeletal:     Cervical back: Normal range of motion and neck supple.     Comments: +1 pitting edema of bilateral lower legs  Skin:    General: Skin is warm and dry.  Neurological:     General: No focal deficit present.     Mental Status: He is alert and oriented to person, place, and time.     Labs on Admission: I have personally reviewed following labs and imaging studies  CBC: Recent Labs  Lab 11/09/21 1325 11/09/21 1750  WBC 9.0  --   NEUTROABS 6.8  --   HGB 9.4* 9.0*  HCT 29.6* 29.0*  MCV 86.8  --   PLT 467*  --    Basic Metabolic Panel: Recent Labs  Lab 11/09/21 1325  NA 133*  K 4.3  CL 101  CO2 23  GLUCOSE 113*  BUN 20  CREATININE 1.12  CALCIUM 8.9   GFR: Estimated Creatinine Clearance: 59.2 mL/min (by C-G formula based on SCr of  1.12 mg/dL). Liver Function Tests: No results for input(s): AST, ALT, ALKPHOS, BILITOT, PROT, ALBUMIN in the last 168 hours. No results for input(s): LIPASE, AMYLASE in the last 168 hours. No results for input(s): AMMONIA in the last 168 hours. Coagulation Profile: Recent Labs  Lab  11/03/21 0311 11/04/21 0108 11/09/21 1325  INR 1.5* 1.6* 2.0*   Cardiac Enzymes: No results for input(s): CKTOTAL, CKMB, CKMBINDEX, TROPONINI in the last 168 hours. BNP (last 3 results) No results for input(s): PROBNP in the last 8760 hours. HbA1C: No results for input(s): HGBA1C in the last 72 hours. CBG: No results for input(s): GLUCAP in the last 168 hours. Lipid Profile: No results for input(s): CHOL, HDL, LDLCALC, TRIG, CHOLHDL, LDLDIRECT in the last 72 hours. Thyroid Function Tests: No results for input(s): TSH, T4TOTAL, FREET4, T3FREE, THYROIDAB in the last 72 hours. Anemia Panel: No results for input(s): VITAMINB12, FOLATE, FERRITIN, TIBC, IRON, RETICCTPCT in the last 72 hours. Urine analysis:    Component Value Date/Time   COLORURINE YELLOW 02/13/2020 2200   APPEARANCEUR CLEAR 02/13/2020 2200   LABSPEC 1.026 02/13/2020 2200   PHURINE 5.0 02/13/2020 2200   GLUCOSEU NEGATIVE 02/13/2020 2200   HGBUR NEGATIVE 02/13/2020 2200   BILIRUBINUR NEGATIVE 02/13/2020 2200   KETONESUR NEGATIVE 02/13/2020 2200   PROTEINUR NEGATIVE 02/13/2020 2200   UROBILINOGEN 0.2 06/07/2009 0815   NITRITE NEGATIVE 02/13/2020 2200   LEUKOCYTESUR NEGATIVE 02/13/2020 2200    Radiological Exams on Admission: I have personally reviewed images CT Angio Chest PE W and/or Wo Contrast  Result Date: 11/09/2021 CLINICAL DATA:  PE suspected, recent hip surgery shortness of breath worsening with exertion, history of PE, currently on Coumadin EXAM: CT ANGIOGRAPHY CHEST WITH CONTRAST TECHNIQUE: Multidetector CT imaging of the chest was performed using the standard protocol during bolus administration of intravenous contrast. Multiplanar CT image reconstructions and MIPs were obtained to evaluate the vascular anatomy. RADIATION DOSE REDUCTION: This exam was performed according to the departmental dose-optimization program which includes automated exposure control, adjustment of the mA and/or kV according to  patient size and/or use of iterative reconstruction technique. CONTRAST:  85m OMNIPAQUE IOHEXOL 350 MG/ML SOLN COMPARISON:  02/13/2020 FINDINGS: Cardiovascular: Satisfactory opacification of the pulmonary arteries to the segmental level. No evidence of pulmonary embolism. Mild cardiomegaly. Three-vessel coronary artery calcifications. No pericardial effusion. Aortic atherosclerosis. Mediastinum/Nodes: No enlarged mediastinal, hilar, or axillary lymph nodes. Thyroid gland, trachea, and esophagus demonstrate no significant findings. Lungs/Pleura: Minimal dependent bibasilar scarring and or atelectasis. No pleural effusion or pneumothorax. Upper Abdomen: No acute abnormality. Musculoskeletal: No chest wall abnormality. No acute osseous findings. Review of the MIP images confirms the above findings. IMPRESSION: 1. Negative examination for pulmonary embolism. 2. Coronary artery disease. Aortic Atherosclerosis (ICD10-I70.0). Electronically Signed   By: ADelanna AhmadiM.D.   On: 11/09/2021 15:47   CT PELVIS WO CONTRAST  Result Date: 11/09/2021 CLINICAL DATA:  Pelvic fracture. Patient is 5 days status post left hip arthroplasty, presenting with anemia. Evaluate for perioperative bleeding/hematoma. EXAM: CT PELVIS WITHOUT CONTRAST TECHNIQUE: Multidetector CT imaging of the pelvis was performed following the standard protocol without intravenous contrast. RADIATION DOSE REDUCTION: This exam was performed according to the departmental dose-optimization program which includes automated exposure control, adjustment of the mA and/or kV according to patient size and/or use of iterative reconstruction technique. COMPARISON:  Pelvic radiograph 10/31/2021 FINDINGS: Of note, streak artifact from bilateral hip prostheses degrades image quality. Urinary Tract: No abnormality visualized. Residual intravenous contrast fills the mildly distended urinary bladder. Bowel: Hard  stools are seen throughout the distal descending and sigmoid  colon as well as rectum. No findings of bowel obstruction or inflammatory changes in the visualized small bowel and colon. Vascular/Lymphatic: Scattered calcific aortoiliac atherosclerosis. No enlarged lymph nodes in the pelvis. Reproductive: A few metallic densities project at the level of the prostate, likely related to prior gold seed implantation. Other: Moderate subcutaneous emphysema is noted of the lower left lateral flank extending to the level of the hip and along the lateral aspect of the visualized proximal thigh. Surgical skin staples are noted at the level of the left hip with trace subcutaneous emphysema deep to the staples, consistent with recent postoperative status. No focal fluid collection. Musculoskeletal: Bilateral total hip prostheses are intact and in appropriate position. No acute osseous abnormality. Multilevel severe degenerative changes in the visualized lower lumbar levels. IMPRESSION: Expected postoperative changes of recent total left hip arthroplasty. No hematoma or source of bleeding identified. Electronically Signed   By: Ileana Roup M.D.   On: 11/09/2021 21:04   DG Chest Portable 1 View  Result Date: 11/09/2021 CLINICAL DATA:  Shortness of breath.  Recent hip surgery. EXAM: PORTABLE CHEST 1 VIEW COMPARISON:  02/13/2020 FINDINGS: Heart size and mediastinal contours appear normal. Lung volumes are low. There is no pleural effusion or edema. No airspace opacities. Advanced degenerative changes are noted within both glenohumeral joints. IMPRESSION: Low lung volumes.  No acute findings. Electronically Signed   By: Kerby Moors M.D.   On: 11/09/2021 13:34    EKG: I have personally reviewed EKG: Sinus rhythm, RBBB.  No acute ischemic changes.  Assessment/Plan Principal Problem:   Symptomatic anemia Active Problems:   Primary hypercoagulable state (HCC)   PAF (paroxysmal atrial fibrillation) (HCC)   Generalized weakness   Dyspnea on exertion   Hyperlipemia   Essential  hypertension   Status post total replacement of left hip   Non-insulin dependent type 2 diabetes mellitus (HCC)   COPD (chronic obstructive pulmonary disease) (HCC)    Assessment and Plan: * Symptomatic anemia- (present on admission) Likely due to blood loss from recent hip surgery. Hemoglobin slightly down trended from 9.4 to 9.0 on labs done in the ED, was 10.1 on labs done a week ago.  Patient is endorsing generalized weakness and dyspnea on exertion.  He is on warfarin but not endorsing any symptoms of GI bleed and FOBT negative.  CT pelvis showing expected postop changes of recent total left hip arthroplasty with no hematoma or source of bleeding identified.  -1 unit PRBCs ordered in the ED.  Follow-up posttransfusion H&H.  Patient has already taken warfarin today and INR 2.0.  If hemoglobin remains stable on morning labs, resume warfarin.   Dyspnea on exertion- (present on admission) Likely due to anemia.  Not hypoxic or tachypneic.  CT angiogram chest negative for PE or any other acute findings.  Work-up not suggestive of ACS. -Continue to monitor  Generalized weakness Likely due to anemia.  No evidence of pneumonia on imaging.  COVID and flu negative. -UA ordered to rule out UTI.  PT/OT eval, fall precautions.  PAF (paroxysmal atrial fibrillation) ([place])- (present on admission) Stable. -Patient has already taken warfarin today and INR 2.0.  If hemoglobin remains stable on morning labs, resume warfarin.  Primary hypercoagulable state ([place])- (present on admission) Protein C and protein S deficiency History of PE/DVT CT angiogram negative for PE. -Patient has already taken warfarin today and INR 2.0.  If hemoglobin remains stable on morning labs, resume warfarin.  COPD (chronic obstructive pulmonary disease) (HCC) Stable.  No signs of acute exacerbation.  Not on any home inhalers. -DuoNeb as needed  Non-insulin dependent type 2 diabetes mellitus ([place]) Well-controlled - A1c 6.5  on 10/22/2021. -Sliding scale insulin sensitive ACHS  Status post total replacement of left hip Stable findings on imaging. -Continue Norco as needed for pain.  PT/OT eval.  Essential hypertension- (present on admission) Stable. -Continue lisinopril  Hyperlipemia- (present on admission) -Continue Zocor  DVT prophylaxis: Coumadin Code Status:  DNR (confirmed with the patient) Family Communication: No family available at this time. Admission status: Observation, Med-Surg It is my clinical opinion that referral for OBSERVATION is reasonable and necessary in this patient based on the above information provided. The aforementioned taken together are felt to place the patient at high risk for further clinical deterioration. However, it is anticipated that the patient may be medically stable for discharge from the hospital within 24 to 48 hours.   Shela Leff, MD [facility] 11/10/2021, 12:36 AM

## 2021-11-10 NOTE — TOC Initial Note (Addendum)
Transition of Care [facility]) - Initial/Assessment Note    Patient Details  Name: Colton Miranda MRN: [id number] Date of Birth: 1941-02-11  Transition of Care [facility]) CM/SW Contact:    Bartholomew Crews, RN Phone Number: [phone] 11/10/2021, 2:25 PM  Clinical Narrative:                  Spoke with patient, spouse, and daughter at the bedside. PTA home with spouse. [facility] active for nursing and PT. Recently discharged on Monday 2/13. Ambulance transportation utilized for transport home. Requested to have ambulance transport at discharge. HTA notified of need for ambulance transportation when discharge - authorization pending. TOC following for transition needs.   Update: Spoke with patient's spouse outside patient room. Spouse expressed concern about patient's change in personality stating that he seems to have gone from Dr. Jack Quarto to Mr. Mathews Robinsons. She wondered if his pain medication might be adding to his confusion. Discussed that medications can cause confusion at times as well as being in the hospital.   Expected Discharge Plan: [place] Barriers to Discharge: Continued Medical Work up   Patient Goals and CMS Choice Patient states their goals for this hospitalization and ongoing recovery are:: return home with wife CMS Medicare.gov Compare Post Acute Care list provided to:: Patient Choice offered to / list presented to : Patient, Spouse, Adult Children  Expected Discharge Plan and Services Expected Discharge Plan: [place]   Discharge Planning Services: CM Consult Post Acute Care Choice: [place] arrangements for the past 2 months: Apartment                 DME Arranged: N/A DME Agency: NA       HH Arranged: RN, PT [place] Agency: [place]        Prior Living Arrangements/Services Living arrangements for the past 2 months: Apartment Lives with:: Self, Spouse Patient language and need for interpreter reviewed:: Yes Do  you feel safe going back to the place where you live?: Yes      Need for Family Participation in Patient Care: Yes (Comment) Care giver support system in place?: Yes (comment) Current home services: DME (3/1) Criminal Activity/Legal Involvement Pertinent to Current Situation/Hospitalization: No - Comment as needed  Activities of Daily Living Home Assistive Devices/Equipment: Environmental consultant (specify type) ADL Screening (condition at time of admission) Patient's cognitive ability adequate to safely complete daily activities?: Yes Is the patient deaf or have difficulty hearing?: Yes Does the patient have difficulty seeing, even when wearing glasses/contacts?: No Does the patient have difficulty concentrating, remembering, or making decisions?: No Patient able to express need for assistance with ADLs?: Yes Does the patient have difficulty dressing or bathing?: Yes Independently performs ADLs?: Yes (appropriate for developmental age) Communication: Independent Dressing (OT): Needs assistance Is this a change from baseline?: Pre-admission baseline Grooming: Needs assistance Is this a change from baseline?: Pre-admission baseline Feeding: Independent Bathing: Needs assistance Is this a change from baseline?: Pre-admission baseline Toileting: Needs assistance Is this a change from baseline?: Pre-admission baseline In/Out Bed: Needs assistance Is this a change from baseline?: Pre-admission baseline Walks in Home: Needs assistance Is this a change from baseline?: Pre-admission baseline Does the patient have difficulty walking or climbing stairs?: Yes Weakness of Legs: Both Weakness of Arms/Hands: None  Permission Sought/Granted                  Emotional Assessment Appearance:: Appears stated age  Psych Involvement: No (comment)  Admission diagnosis:  Symptomatic anemia [D64.9] Anemia, unspecified type [D64.9] Dyspnea, unspecified type [R06.00] Patient Active Problem List    Diagnosis Date Noted   Generalized weakness 11/10/2021   Dyspnea on exertion 11/10/2021   Symptomatic anemia 11/09/2021   Non-insulin dependent type 2 diabetes mellitus ([place]) 11/09/2021   COPD (chronic obstructive pulmonary disease) ([place]) 11/09/2021   Status post total replacement of left hip 10/31/2021   Unilateral primary osteoarthritis, left hip 07/29/2021   Anemia 02/14/2020   Hyponatremia 02/14/2020   S/P total knee arthroplasty, left 02/14/2020   Osteoarthritis of left knee 02/10/2020   Contact dermatitis 01/19/2020   PAF (paroxysmal atrial fibrillation) ([place]) 04/14/2018   Malignant neoplasm of prostate ([place]) 10/07/2017   Colon polyps 11/26/2016   Influenza vaccination declined 05/28/2016   History of fall 07/25/2015   Palpitations 11/08/2014   Pain of left arm 11/08/2014   Pain in joint, lower leg 02/28/2014   Essential hypertension 02/28/2014   Disturbance of skin sensation 02/23/2013   Insomnia, unspecified 02/23/2013   Obesity    DM (diabetes mellitus), secondary, uncontrolled, with peripheral vascular complications    Hyperlipemia    Urinary frequency    Primary hypercoagulable state ([place]) 01/03/2013   Other pulmonary embolism and infarction 01/03/2013   Long term current use of anticoagulant therapy 01/03/2013   PCP:  Ginger Organ., MD Pharmacy:   [facility], [place] [address] Phone: [phone] Fax: [phone]     Social Determinants of Health (SDOH) Interventions    Readmission Risk Interventions No flowsheet data found.

## 2021-11-10 NOTE — Assessment & Plan Note (Addendum)
Likely due to anemia.  No evidence of pneumonia on imaging.  COVID and flu negative. -UA ordered to rule out UTI.  PT/OT eval, fall precautions.

## 2021-11-10 NOTE — Assessment & Plan Note (Addendum)
Stable. -Patient has already taken warfarin today and INR 2.0.  If hemoglobin remains stable on morning labs, resume warfarin.

## 2021-11-10 NOTE — Evaluation (Signed)
Physical Therapy Evaluation Patient Details Name: Colton Miranda MRN: [id number] DOB: 07/27/1941 Today's Date: 11/10/2021  History of Present Illness  81 year old male  presenting to the ED 11/09/21 via EMS for evaluation of dyspnea on exertion. 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 PTA patient was living with spouse and ambulating up to 50 ft with RW (per pt). Pt currently requires supervision/minguard assist to ambulate with RW. No dyspnea with exertion noted. Patient feels good about discharge plan to return home with HHPT.  Anticipate patient will benefit from PT to address problems listed below.Will continue to follow acutely to maximize functional mobility independence and safety.          Recommendations for follow up therapy are one component of a multi-disciplinary discharge planning process, led by the attending physician.  Recommendations may be updated based on patient status, additional functional criteria and insurance authorization.  Follow Up Recommendations Home health PT    Assistance Recommended at Discharge Intermittent Supervision/Assistance  Patient can return home with the following  A little help with walking and/or transfers;Assistance with cooking/housework;Help with stairs or ramp for entrance    Equipment Recommendations None recommended by PT  Recommendations for Other Services       Functional Status Assessment Patient has had a recent decline in their functional status and demonstrates the ability to make significant improvements in function in a reasonable and predictable amount of time.     Precautions / Restrictions Precautions Precautions:  Fall Restrictions LLE Weight Bearing: Weight bearing as tolerated      Mobility  Bed Mobility Overal bed mobility: Needs Assistance Bed Mobility: Supine to Sit     Supine to sit: Mod assist     General bed mobility comments: sleeps in lift/recliner chair at home    Transfers Overall transfer level: Needs assistance Equipment used: Rolling walker (2 wheels) Transfers: Sit to/from Stand Sit to Stand: Min assist, From elevated surface           General transfer comment: stood from EOB with elevated ~3"    Ambulation/Gait Ambulation/Gait assistance: Min guard Gait Distance (Feet): 8 Feet Assistive device: Rolling walker (2 wheels) Gait Pattern/deviations: Step-to pattern, Decreased weight shift to left, Trunk flexed   Gait velocity interpretation: <1.31 ft/sec, indicative of household ambulator   General Gait Details: slow pace with shortened stride length; limited by length of primofit tubing  Stairs            Wheelchair Mobility    Modified Rankin (Stroke Patients Only)       Balance Overall balance assessment: Needs assistance Sitting-balance support: No upper extremity supported, Feet supported Sitting balance-Leahy Scale: Fair     Standing balance support: Bilateral upper extremity supported, Reliant on assistive device for balance Standing balance-Leahy Scale: Poor                               Pertinent Vitals/Pain Pain Assessment Pain Assessment: Faces Faces Pain Scale: Hurts a little bit Pain Location: left hip and bladder (feels like he cannot urinate) Pain Descriptors / Indicators: Cramping Pain Intervention(s): Limited activity within patient's tolerance, Monitored during session, Premedicated before session    Home  Living Family/patient expects to be discharged to:: Private residence Living Arrangements: Spouse/significant other Available Help at Discharge: Family;Available 24 hours/day Type of Home: Apartment Home  Access: Stairs to enter Entrance Stairs-Rails: None Entrance Stairs-Number of Steps: 1   Home Layout: One level Home Equipment: Rolling Walker (2 wheels);BSC/3in1;Tub bench;Grab bars - tub/shower;Hand held shower head;Adaptive equipment      Prior Function Prior Level of Function : Needs assist             Mobility Comments: Used RW for ambulation even prior to THA ADLs Comments: report he could put on his shoes and socks with increased time, uses sock aid     Hand Dominance   Dominant Hand: Right    Extremity/Trunk Assessment   Upper Extremity Assessment Upper Extremity Assessment: Defer to OT evaluation    Lower Extremity Assessment Lower Extremity Assessment: LLE deficits/detail LLE Deficits / Details: Deficits consistent with post op pain and weakness.    Cervical / Trunk Assessment Cervical / Trunk Assessment: Kyphotic  Communication   Communication: No difficulties  Cognition Arousal/Alertness: Awake/alert Behavior During Therapy: WFL for tasks assessed/performed Overall Cognitive Status: Within Functional Limits for tasks assessed                                          General Comments General comments (skin integrity, edema, etc.): pt verbose and requires redirection to task at hand    Exercises     Assessment/Plan    PT Assessment Patient needs continued PT services  PT Problem List Decreased strength;Decreased range of motion;Decreased balance;Decreased mobility;Decreased activity tolerance;Decreased knowledge of use of DME;Pain       PT Treatment Interventions DME instruction;Gait training;Stair training;Functional mobility training;Therapeutic activities;Therapeutic exercise;Patient/family education;Balance training    PT Goals (Current goals can be found in the Care Plan section)  Acute Rehab PT Goals Patient Stated Goal: to go home PT Goal Formulation: With patient Time For Goal Achievement: 11/24/21 Potential to Achieve  Goals: Good    Frequency Min 3X/week     Co-evaluation               AM-PAC PT "6 Clicks" Mobility  Outcome Measure Help needed turning from your back to your side while in a flat bed without using bedrails?: A Lot Help needed moving from lying on your back to sitting on the side of a flat bed without using bedrails?: A Lot Help needed moving to and from a bed to a chair (including a wheelchair)?: A Little Help needed standing up from a chair using your arms (e.g., wheelchair or bedside chair)?: A Little Help needed to walk in hospital room?: A Little Help needed climbing 3-5 steps with a railing? : Total 6 Click Score: 14    End of Session Equipment Utilized During Treatment: Gait belt Activity Tolerance: Patient tolerated treatment well Patient left: in chair;with call bell/phone within reach;with chair alarm set Nurse Communication: Mobility status PT Visit Diagnosis: Other abnormalities of gait and mobility (R26.89);Pain Pain - Right/Left: Left Pain - part of body: Hip    Time: [id number] PT Time Calculation (min) (ACUTE ONLY): 20 min   Charges:   PT Evaluation $PT Eval Moderate Complexity: [place], PT Acute Rehabilitation Services  Pager [phone] Office [phone]   Rexanne Mano 11/10/2021, 10:12 AM

## 2021-11-10 NOTE — Assessment & Plan Note (Addendum)
Likely due to anemia.  Not hypoxic or tachypneic.  CT angiogram chest negative for PE or any other acute findings.  Work-up not suggestive of ACS. -Continue to monitor

## 2021-11-10 NOTE — Progress Notes (Signed)
New Admission Note:   Arrival Method: stretcher Mental Orientation: ax4 Telemetry: none Assessment: Completed Skin: see flowsheet IV: NSL Pain: none Tubes: none Safety Measures: Safety Fall Prevention Plan has been discussed Admission: Completed [facility] Orientation: Patient has been orientated to the room, unit and staff.  Family: none at bedside  Orders have been reviewed and implemented. Will continue to monitor the patient. Call light has been placed within reach and bed alarm has been activated.   Rockie Neighbours BSN, RN Phone number: [phone]

## 2021-11-10 NOTE — Progress Notes (Signed)
PT Cancellation Note  Patient Details Name: Colton Miranda MRN: [id number] DOB: [birth year]   Cancelled Treatment:    Reason Eval/Treat Not Completed: Pain limiting ability to participate  RN providing pain medication. Will return this a.m. to re-attempt PT evaluation.     Arby Barrette, PT Acute Rehabilitation Services  Pager [phone] Office [phone]  Rexanne Mano 11/10/2021, 8:05 AM

## 2021-11-10 NOTE — Assessment & Plan Note (Signed)
Well-controlled - A1c 6.5 on 10/22/2021. -Sliding scale insulin sensitive ACHS

## 2021-11-10 NOTE — Assessment & Plan Note (Signed)
Continue Zocor 

## 2021-11-10 NOTE — Assessment & Plan Note (Signed)
Stable. Continue lisinopril

## 2021-11-10 NOTE — Assessment & Plan Note (Signed)
Likely due to blood loss from recent hip surgery. Hemoglobin slightly down trended from 9.4 to 9.0 on labs done in the ED, was 10.1 on labs done a week ago.  Patient is endorsing generalized weakness and dyspnea on exertion.  He is on warfarin but not endorsing any symptoms of GI bleed and FOBT negative.  CT pelvis showing expected postop changes of recent total left hip arthroplasty with no hematoma or source of bleeding identified.  -1 unit PRBCs ordered in the ED.  Follow-up posttransfusion H&H.  Patient has already taken warfarin today and INR 2.0.  If hemoglobin remains stable on morning labs, resume warfarin.

## 2021-11-10 NOTE — Progress Notes (Signed)
ANTICOAGULATION CONSULT NOTE - Follow-up Consult  Pharmacy Consult for Coumadin Indication:  Afib, h/o VTE, hypercoaguable d/o  No Known Allergies  Patient Measurements: Height: 5\' 6"  (167.6 cm) Weight: 103.4 kg (228 lb) IBW/kg (Calculated) : 63.8  Vital Signs: Temp: 97.9 F (36.6 C) (02/19 0758) Temp Source: Oral (02/19 0758) BP: 140/57 (02/19 0758) Pulse Rate: 76 (02/19 0758)  Labs: Recent Labs    11/09/21 1325 11/09/21 1514 11/09/21 1750 11/10/21 0808  HGB 9.4*  --  9.0* 10.2*  HCT 29.6*  --  29.0* 31.1*  PLT 467*  --   --  456*  LABPROT 22.8*  --   --  21.3*  INR 2.0*  --   --  1.9*  CREATININE 1.12  --   --   --   TROPONINIHS  --  13 14  --      Estimated Creatinine Clearance: 59.2 mL/min (by C-G formula based on SCr of 1.12 mg/dL).   Medical History: Past Medical History:  Diagnosis Date   BPH associated with nocturia    Chronic constipation    COPD (chronic obstructive pulmonary disease) (HCC)    Depression    Eczema    Heart murmur    at birth   History of adenomatous polyp of colon    History of basal cell carcinoma (BCC) of skin    post excision from trunk   History of CVA (cerebrovascular accident) without residual deficits    11-06-2017 per pt and wife "stroke was approx. 20 years ago , 1999, caused by blood clot after knee scope"  DVT to PE   History of depression    over 40 years ago nervous breakdown   History of DVT (deep vein thrombosis)    11-06-2017 per pt and wife happened lower leg approx. 1999 after knee scope   History of pulmonary embolus (PE)    11-06-2017 from DVT in approx. 1999 per wife and pt   HOH (hard of hearing)    Hyperlipidemia    Hypertension    followed by pcp   Long term (current) use of anticoagulants Coumadin-- followed by Pharmasist w/ [facility]   secondary to primary hypercoagulopathy w/ hx DVT and PE   Myalgia and myositis, unspecified    OA (osteoarthritis)    "all joints"   PAF (paroxysmal  atrial fibrillation) ([place]) 2019   Peripheral neuropathy    Pinched nerve in neck    per pt causes intermittant numbness bilateral arms and hands   Primary hypercoagulable state ([place])    Prostate cancer [facility]) urologist-  dr Alyson Ingles  oncologist-- dr Tammi Klippel   dx 08-28-2017  Stage T1c, Gleason 4+4, PSA 14.1, vol 42.8cc-- planned treatment external radiation and ADT   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 Current INR subtherapeutic at 1.9, Hgb 10.2 s/p 1U PRBCs 2/19. Last Coumadin dose taken 2/17.   PTA regimen: 17.5mg  Mondays and 15mg  all other days  Goal of Therapy:  INR 2-3  Plan:  Administer 15 mg Warfarin per PTA schedule  Daily INR/CBC   Adria Dill, PharmD PGY-1 Acute Care Resident  11/10/2021 8:49 AM

## 2021-11-10 NOTE — Progress Notes (Signed)
Colton Miranda  QIH:[id number] DOB: [birth year] DOA: 11/09/2021 PCP: Ginger Organ., MD    Brief Narrative:  [phone] w/ a hx of HTN, DM2, HLD, prostate CA s/p XRT, PAF on warfarin, Prot C and S deficiency, DVT/PE, COPD, BPH, CVA, and L THA 10/31/21 who presented to the ED w/ DOE. In the ED his Hgb was found to be 9.0, down from 10.1 the week prior. INR was 2.0 CTa chest was negative for PE.   Consultants:  none  Code Status: NO CODE BLUE   Antimicrobials:  None   DVT prophylaxis: Warfarin   Interim Hx: Resting comfortably in bedside chair.  Reports urinary hesitancy earlier today, and ongoing constipation at present.  Assessment & Plan:  Dyspnea on exertion - Possible symptomatic anemia Hgb down from 10.1 to 9.0 over 1 week, but at 9.0 doubt this would lead to signif sx - no evidence of bleeding into hip on CT - CTa w/o evidence of PE - was transfused 1U PRBC in ED - no evidence of ACS - CoViD/Flu negative - Hgb increased appropriately w/ transfusion - no sx to suggest GI bleeding   Generalized weakness Likely simply a consequence of his recent signif surgery and deconditioning -deemed safe for home health PT during this admission  PAF Continue usual meds to include warfarin -rate controlled  Protein C&S deficiency - Hx of DVT/PE Cont warfarin   COPD Well compensated  DM2 A1c recently 6.5  S/p L THR - D/C home 11/04/21 Cont PT/OT   HTN BP reasonably controlled   HLD Cont zocor   Family Communication: No family present at time of exam Disposition: From home -discharge home 2/20 if patient able to urinate freely and have a bowel movement  Objective: Blood pressure (!) 140/57, pulse 76, temperature 97.9 F (36.6 C), temperature source Oral, resp. rate 16, height 5\' 6"  (1.676 m), weight 103.4 kg, SpO2 97 %.  Intake/Output Summary (Last 24 hours) at 11/10/2021 1056 Last data filed at 11/10/2021 0900 Gross per 24 hour  Intake 591.67 ml  Output --  Net 591.67 ml    Filed Weights   11/09/21 1242  Weight: 103.4 kg    Examination: Follow-up exam completed  CBC: Recent Labs  Lab 11/09/21 1325 11/09/21 1750 11/10/21 0808  WBC 9.0  --  10.8*  NEUTROABS 6.8  --   --   HGB 9.4* 9.0* 10.2*  HCT 29.6* 29.0* 31.1*  MCV 86.8  --  85.0  PLT 467*  --  433*   Basic Metabolic Panel: Recent Labs  Lab 11/09/21 1325  NA 133*  K 4.3  CL 101  CO2 23  GLUCOSE 113*  BUN 20  CREATININE 1.12  CALCIUM 8.9   GFR: Estimated Creatinine Clearance: 59.2 mL/min (by C-G formula based on SCr of 1.12 mg/dL).   Coagulation Profile: Recent Labs  Lab 11/04/21 0108 11/09/21 1325 11/10/21 0808  INR 1.6* 2.0* 1.9*    HbA1C: Hgb A1c MFr Bld  Date/Time Value Ref Range Status  10/22/2021 03:37 PM 6.5 (H) 4.8 - 5.6 % Final    Comment:    (NOTE) Pre diabetes:          5.7%-6.4%  Diabetes:              >6.4%  Glycemic control for   <7.0% adults with diabetes   02/01/2020 02:44 PM 6.7 (H) 4.8 - 5.6 % Final    Comment:    (NOTE) Pre diabetes:  5.7%-6.4% Diabetes:              >6.4% Glycemic control for   <7.0% adults with diabetes     Scheduled Meds:  insulin aspart  0-5 Units Subcutaneous QHS   insulin aspart  0-9 Units Subcutaneous TID WC   lisinopril  20 mg Oral Daily   polyethylene glycol  17 g Oral BID   senna-docusate  1 tablet Oral BID   simvastatin  20 mg Oral q1800   tamsulosin  0.4 mg Oral Daily   warfarin  15 mg Oral ONCE-1600   Warfarin - Pharmacist Dosing Inpatient   Does not apply q1600   Continuous Infusions:  sodium chloride       LOS: 0 days   Cherene Altes, MD [facility] Office  [phone] Pager - Text Page per Shea Evans  If 7PM-7AM, please contact night-coverage per Amion 11/10/2021, 10:56 AM

## 2021-11-10 NOTE — Progress Notes (Addendum)
ANTICOAGULATION CONSULT NOTE - Initial Consult  Pharmacy Consult for Coumadin Indication:  Afib, h/o VTE, hypercoaguable d/o  No Known Allergies  Patient Measurements: Height: 5\' 6"  (167.6 cm) Weight: 103.4 kg (228 lb) IBW/kg (Calculated) : 63.8  Vital Signs: BP: 110/56 (02/18 2330) Pulse Rate: 75 (02/18 2330)  Labs: Recent Labs    11/09/21 1325 11/09/21 1514 11/09/21 1750  HGB 9.4*  --  9.0*  HCT 29.6*  --  29.0*  PLT 467*  --   --   LABPROT 22.8*  --   --   INR 2.0*  --   --   CREATININE 1.12  --   --   TROPONINIHS  --  13 14    Estimated Creatinine Clearance: 59.2 mL/min (by C-G formula based on SCr of 1.12 mg/dL).   Medical History: Past Medical History:  Diagnosis Date   BPH associated with nocturia    Chronic constipation    COPD (chronic obstructive pulmonary disease) (HCC)    Depression    Eczema    Heart murmur    at birth   History of adenomatous polyp of colon    History of basal cell carcinoma (BCC) of skin    post excision from trunk   History of CVA (cerebrovascular accident) without residual deficits    11-06-2017 per pt and wife "stroke was approx. 20 years ago , 1999, caused by blood clot after knee scope"  DVT to PE   History of depression    over 40 years ago nervous breakdown   History of DVT (deep vein thrombosis)    11-06-2017 per pt and wife happened lower leg approx. 1999 after knee scope   History of pulmonary embolus (PE)    11-06-2017 from DVT in approx. 1999 per wife and pt   HOH (hard of hearing)    Hyperlipidemia    Hypertension    followed by pcp   Long term (current) use of anticoagulants Coumadin-- followed by Pharmasist w/ [facility]   secondary to primary hypercoagulopathy w/ hx DVT and PE   Myalgia and myositis, unspecified    OA (osteoarthritis)    "all joints"   PAF (paroxysmal atrial fibrillation) ([place]) 2019   Peripheral neuropathy    Pinched nerve in neck    per pt causes intermittant numbness  bilateral arms and hands   Primary hypercoagulable state ([place])    Prostate cancer [facility]) urologist-  dr Alyson Ingles  oncologist-- dr Tammi Klippel   dx 08-28-2017  Stage T1c, Gleason 4+4, PSA 14.1, vol 42.8cc-- planned treatment external radiation and ADT   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  Goal of Therapy:  INR 2-3   Plan:  Monitor Hgb prior to resuming home Coumadin regimen of 17.5mg  Mondays and 15mg  all other days; monitor INR.  Wynona Neat, PharmD, BCPS  11/10/2021,12:47 AM

## 2021-11-10 NOTE — Care Management Obs Status (Signed)
[place] NOTIFICATION   Patient Details  Name: ASIR BINGLEY MRN: [id number] Date of Birth: 1941/03/04   Medicare Observation Status Notification Given:  Yes    Bartholomew Crews, RN 11/10/2021, 2:18 PM

## 2021-11-10 NOTE — Assessment & Plan Note (Signed)
Stable.  No signs of acute exacerbation.  Not on any home inhalers. -DuoNeb as needed

## 2021-11-11 ENCOUNTER — Other Ambulatory Visit: Payer: Self-pay | Admitting: Orthopaedic Surgery

## 2021-11-11 ENCOUNTER — Telehealth: Payer: Self-pay | Admitting: *Deleted

## 2021-11-11 DIAGNOSIS — D649 Anemia, unspecified: Secondary | ICD-10-CM | POA: Diagnosis not present

## 2021-11-11 DIAGNOSIS — I959 Hypotension, unspecified: Secondary | ICD-10-CM | POA: Diagnosis not present

## 2021-11-11 DIAGNOSIS — Z7401 Bed confinement status: Secondary | ICD-10-CM | POA: Diagnosis not present

## 2021-11-11 LAB — BPAM RBC
Blood Product Expiration Date: 202303222359
ISSUE DATE / TIME: 202302190134
Unit Type and Rh: 5100

## 2021-11-11 LAB — COMPREHENSIVE METABOLIC PANEL
ALT: 25 U/L (ref 0–44)
AST: 21 U/L (ref 15–41)
Albumin: 2.7 g/dL — ABNORMAL LOW (ref 3.5–5.0)
Alkaline Phosphatase: 105 U/L (ref 38–126)
Anion gap: 8 (ref 5–15)
BUN: 27 mg/dL — ABNORMAL HIGH (ref 8–23)
CO2: 23 mmol/L (ref 22–32)
Calcium: 8.7 mg/dL — ABNORMAL LOW (ref 8.9–10.3)
Chloride: 99 mmol/L (ref 98–111)
Creatinine, Ser: 1.03 mg/dL (ref 0.61–1.24)
GFR, Estimated: >60 mL/min (ref >60)
Glucose, Bld: 140 mg/dL — ABNORMAL HIGH (ref 70–99)
Potassium: 4.2 mmol/L (ref 3.5–5.1)
Sodium: 130 mmol/L — ABNORMAL LOW (ref 135–145)
Total Bilirubin: 0.6 mg/dL (ref 0.3–1.2)
Total Protein: 6.2 g/dL — ABNORMAL LOW (ref 6.5–8.1)

## 2021-11-11 LAB — CBC
HCT: 29.5 % — ABNORMAL LOW (ref 39.0–52.0)
Hemoglobin: 9.5 g/dL — ABNORMAL LOW (ref 13.0–17.0)
MCH: 27.4 pg (ref 26.0–34.0)
MCHC: 32.2 g/dL (ref 30.0–36.0)
MCV: 85 fL (ref 80.0–100.0)
Platelets: 434 10*3/uL — ABNORMAL HIGH (ref 150–400)
RBC: 3.47 MIL/uL — ABNORMAL LOW (ref 4.22–5.81)
RDW: 14.1 % (ref 11.5–15.5)
WBC: 10.9 10*3/uL — ABNORMAL HIGH (ref 4.0–10.5)
nRBC: 0 % (ref 0.0–0.2)

## 2021-11-11 LAB — TYPE AND SCREEN
ABO/RH(D): O POS
Antibody Screen: NEGATIVE
Unit division: 0

## 2021-11-11 LAB — PROTIME-INR
INR: 1.6 — ABNORMAL HIGH (ref 0.8–1.2)
Prothrombin Time: 19.3 seconds — ABNORMAL HIGH (ref 11.4–15.2)

## 2021-11-11 LAB — GLUCOSE, CAPILLARY
Glucose-Capillary: 105 mg/dL — ABNORMAL HIGH (ref 70–99)
Glucose-Capillary: 119 mg/dL — ABNORMAL HIGH (ref 70–99)
Glucose-Capillary: 110 mg/dL — ABNORMAL HIGH (ref 70–99)

## 2021-11-11 LAB — MAGNESIUM: Magnesium: 2 mg/dL (ref 1.7–2.4)

## 2021-11-11 MED ORDER — HYDROCODONE-ACETAMINOPHEN 7.5-325 MG PO TABS: 1.0000 | ORAL_TABLET | Freq: Four times a day (QID) | ORAL | 0 refills | Status: DC | PRN

## 2021-11-11 MED ORDER — BETHANECHOL CHLORIDE 10 MG PO TABS: 10.0000 mg | ORAL_TABLET | Freq: Three times a day (TID) | ORAL | 0 refills | Status: DC

## 2021-11-11 MED ORDER — SENNOSIDES-DOCUSATE SODIUM 8.6-50 MG PO TABS: 1.0000 | ORAL_TABLET | Freq: Two times a day (BID) | ORAL | Status: AC

## 2021-11-11 MED ORDER — POLYETHYLENE GLYCOL 3350 17 G PO PACK: 17.0000 g | PACK | Freq: Two times a day (BID) | ORAL | 0 refills | Status: AC

## 2021-11-11 MED ORDER — NITROFURANTOIN MONOHYD MACRO 100 MG PO CAPS: 100.0000 mg | ORAL_CAPSULE | Freq: Two times a day (BID) | ORAL | 0 refills | Status: AC

## 2021-11-11 MED ORDER — TAMSULOSIN HCL 0.4 MG PO CAPS: 0.4000 mg | ORAL_CAPSULE | Freq: Every day | ORAL | 0 refills | Status: DC

## 2021-11-11 NOTE — TOC Progression Note (Signed)
Transition of Care [facility]) - Progression Note    Patient Details  Name: Colton Miranda MRN: [id number] Date of Birth: 1941/06/22  Transition of [facility]) CM/SW Contact  Bartholomew Crews, RN Phone Number: [phone] 11/11/2021, 7:25 AM  Clinical Narrative:     Received message from [place] at [facility] that nonemergency transport has been approved. Reference# [id number]. This authorization is good for 90 days. For questions, call [facility] at [phone]. TOC following for transition needs.  Expected Discharge Plan: [place] Barriers to Discharge: Continued Medical Work up  Expected Discharge Plan and Services Expected Discharge Plan: [place]   Discharge Planning Services: CM Consult Post Acute Care Choice: [place] arrangements for the past 2 months: Apartment                 DME Arranged: N/A DME Agency: NA       HH Arranged: RN, PT [place] Agency: [place]         Social Determinants of Health (SDOH) Interventions    Readmission Risk Interventions No flowsheet data found.

## 2021-11-11 NOTE — Evaluation (Signed)
Occupational Therapy Evaluation Patient Details Name: Colton Miranda MRN: [id number] DOB: 1941/02/17 Today's Date: 11/11/2021   History of Present Illness 81 year old male  presenting to the ED 11/09/21 via EMS for evaluation of dyspnea on exertion. 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 Pt is alert and oriented x4. PTA pt reports wife was assisting with ADLs due to recent hip surgery. Pt is currently bathing at sink level and performing grooming tasks in seated following surgery. Pt appears to be progressing in participation in functional tasks as compared to previous admission. Pt denies symptoms of dyspnea, pain throughout evaluation. Pt continues to perform ADL below PLOF and would benefit from skilled OT while in house to address deficits. Pt is left in bedside chair, NAD, all needs met. OT will continue to follow while admitted.      Recommendations for follow up therapy are one component of a multi-disciplinary discharge planning process, led by the attending physician.  Recommendations may be updated based on patient status, additional functional criteria and insurance authorization.   Follow Up Recommendations  Follow physician's recommendations for discharge plan and follow up therapies    Assistance Recommended at Discharge Frequent or constant Supervision/Assistance  Patient can return home with the following A lot of help with bathing/dressing/bathroom;Assistance with cooking/housework;Assist for transportation;Help with stairs or ramp for entrance;A lot of help with walking and/or transfers    Functional Status Assessment  Patient has had a recent decline in their  functional status and/or demonstrates limited ability to make significant improvements in function in a reasonable and predictable amount of time  Equipment Recommendations  None recommended by OT;Other (comment) (pt has recommended equipment)    Recommendations for Other Services       Precautions / Restrictions Precautions Precautions: Fall Precaution Comments: limited left shoulder ROM- frozen shoulder Restrictions Weight Bearing Restrictions: Yes LLE Weight Bearing: Weight bearing as tolerated      Mobility Bed Mobility Overal bed mobility: Needs Assistance Bed Mobility: Supine to Sit     Supine to sit: Mod assist          Transfers Overall transfer level: Needs assistance Equipment used: Rolling walker (2 wheels) Transfers: Sit to/from Stand Sit to Stand: Min assist                  Balance Overall balance assessment: Needs assistance Sitting-balance support: No upper extremity supported, Feet supported Sitting balance-Leahy Scale: Fair     Standing balance support: Bilateral upper extremity supported, Reliant on assistive device for balance Standing balance-Leahy Scale: Poor                             ADL either performed or assessed with clinical judgement   ADL Overall ADL's : Needs assistance/impaired Eating/Feeding: Set up;Sitting   Grooming: Set up;Sitting;Wash/dry hands;Wash/dry face;Oral care       Lower Body Bathing: Moderate assistance;Sitting/lateral leans;Sit to/from stand           Toilet Transfer: Min guard;Rolling walker (2 wheels);Ambulation Toilet Transfer Details (indicate cue type and reason): simulated to bedside chair Toileting- Clothing Manipulation and Hygiene: Moderate assistance Toileting - Clothing Manipulation Details (indicate cue type  and reason): for peri care following BM     Functional mobility during ADLs: Min guard;Rolling walker (2 wheels)       Vision Baseline Vision/History: 1 Wears  glasses Ability to See in Adequate Light: 0 Adequate Vision Assessment?: No apparent visual deficits     Perception Perception Perception: Not tested   Praxis Praxis Praxis: Not tested    Pertinent Vitals/Pain Pain Assessment Pain Assessment: No/denies pain     Hand Dominance Right   Extremity/Trunk Assessment Upper Extremity Assessment Upper Extremity Assessment: LUE deficits/detail;Generalized weakness LUE Deficits / Details: 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 per pt report LUE Coordination: decreased gross motor   Lower Extremity Assessment Lower Extremity Assessment: LLE deficits/detail LLE Deficits / Details: s/p hip replcement 2/9   Cervical / Trunk Assessment Cervical / Trunk Assessment: Kyphotic   Communication Communication Communication: No difficulties   Cognition Arousal/Alertness: Awake/alert Behavior During Therapy: WFL for tasks assessed/performed Overall Cognitive Status: Within Functional Limits for tasks assessed                                 General Comments: alert and oriented x4     General Comments  no c/o dyspnea throughout eval    Exercises     Shoulder Instructions      Home Living Family/patient expects to be discharged to:: Private residence Living Arrangements: Spouse/significant other Available Help at Discharge: Family;Available 24 hours/day Type of Home: Apartment Home Access: Stairs to enter Entrance Stairs-Number of Steps: 1 Entrance Stairs-Rails: None Home Layout: One level     Bathroom Shower/Tub: Teacher, early years/pre: Standard Bathroom Accessibility: Yes   Home Equipment: Rolling Walker (2 wheels);BSC/3in1;Tub bench;Grab bars - tub/shower;Hand held shower head;Adaptive equipment;Hospital bed (pt reports hospital bed is broken, sleeps in lift chair at this time) Adaptive Equipment: Reacher;Sock aid        Prior Functioning/Environment Prior Level of Function : Needs assist              Mobility Comments: RW for amb ADLs Comments: Pt participates in all ADL, requires assist from wife at this time due to recent surgery. Currently sink bathing, using AE for LB dressing; assist for IADL due to recent hip surgery        OT Problem List: Decreased strength;Decreased activity tolerance;Impaired balance (sitting and/or standing);Decreased knowledge of use of DME or AE;Pain      OT Treatment/Interventions: Self-care/ADL training;DME and/or AE instruction;Therapeutic activities;Patient/family education;Balance training    OT Goals(Current goals can be found in the care plan section) Acute Rehab OT Goals Patient Stated Goal: to go home OT Goal Formulation: With patient Time For Goal Achievement: 11/25/21 Potential to Achieve Goals: Good ADL Goals Pt Will Perform Grooming: with min guard assist;standing Pt Will Perform Lower Body Dressing: with modified independence;sit to/from stand;with adaptive equipment Pt Will Transfer to Toilet: with min guard assist;bedside commode;ambulating Pt Will Perform Toileting - Clothing Manipulation and hygiene: with min guard assist;sit to/from stand  OT Frequency: Min 2X/week    Co-evaluation              AM-PAC OT "6 Clicks" Daily Activity     Outcome Measure Help from another person eating meals?: None Help from another person taking care of personal grooming?: A Little Help from another person toileting, which includes using toliet, bedpan, or urinal?: A Lot Help from another person bathing (including washing, rinsing, drying)?: A Lot Help from  another person to put on and taking off regular upper body clothing?: A Little Help from another person to put on and taking off regular lower body clothing?: A Lot 6 Click Score: 16   End of Session Equipment Utilized During Treatment: Gait belt;Rolling walker (2 wheels) Nurse Communication: Mobility status  Activity Tolerance: Patient tolerated treatment well Patient left: in  chair;with call bell/phone within reach;with chair alarm set  OT Visit Diagnosis: Unsteadiness on feet (R26.81);Other abnormalities of gait and mobility (R26.89);Pain;Muscle weakness (generalized) (M62.81)                Time: 7412-8786 OT Time Calculation (min): 31 min Charges:  OT General Charges $OT Visit: 1 Visit OT Evaluation $OT Eval Low Complexity: 1 Low OT Treatments $Self Care/Home Management : 8-22 mins  Shanon Payor, OTD OTR/L  11/11/21, 10:49 AM

## 2021-11-11 NOTE — TOC Transition Note (Signed)
Transition of Care [facility]) - CM/SW Discharge Note   Patient Details  Name: QUINTAVIS BRANDS MRN: [id number] Date of Birth: 1941/01/21  Transition of Care [facility]) CM/SW Contact:  Tom-Johnson, Renea Ee, RN Phone Number: 11/11/2021, 3:30 PM   Clinical Narrative:     Patient is scheduled for discharge today. HHPT/OT/RN referral with [facility] and information on AVS. Transportation scheduled with [facility] with Reference# [id number] from [place]. Stacey with [place] notified of CBC and INR lab draw for Wednesday, 11/13/21. No further TOC needs noted.   Final next level of care: Cross Barriers to Discharge: Barriers Resolved   Patient Goals and CMS Choice Patient states their goals for this hospitalization and ongoing recovery are:: return home with wife CMS Medicare.gov Compare Post Acute Care list provided to:: Patient Choice offered to / list presented to : Patient, Spouse, Adult Children  Discharge Placement                Patient to be transferred to facility by: [facility]      Discharge Plan and Services   Discharge Planning Services: CM Consult Post Acute Care Choice: Home Health          DME Arranged: N/A DME Agency: NA       HH Arranged: OT HH Agency: [facility]        Social Determinants of Health (SDOH) Interventions     Readmission Risk Interventions No flowsheet data found.

## 2021-11-11 NOTE — Progress Notes (Signed)
DISCHARGE NOTE HOME Colton Miranda to be discharged Home per MD order. Discussed prescriptions and follow up appointments with the patient. Prescriptions given to patient; medication list explained in detail. Patient verbalized understanding.  Skin clean, dry and intact without evidence of skin break down, no evidence of skin tears noted. IV catheter discontinued intact. Site without signs and symptoms of complications. Dressing and pressure applied. Pt denies pain at the site currently. No complaints noted.  Patient free of lines, drains, and wounds.   An After Visit Summary (AVS) was printed and given to the patient. Patient escorted via stretcher, and discharged home via [name].  [name] S [name], RN

## 2021-11-11 NOTE — Telephone Encounter (Signed)
Patient's wife called and states patient is being discharged home today. He will be sent via ambulance transport. He will need refill of pain medication as hospital did not send to his pharmacy. Thanks.

## 2021-11-11 NOTE — Discharge Instructions (Signed)
Information on my medicine - Coumadin   (Warfarin)  This medication education was reviewed with me or my healthcare representative as part of my discharge preparation.    Why was Coumadin prescribed for you? Coumadin was prescribed for you because you have a blood clot or a medical condition that can cause an increased risk of forming blood clots. Blood clots can cause serious health problems by blocking the flow of blood to the heart, lung, or brain. Coumadin can prevent harmful blood clots from forming. As a reminder your indication for Coumadin is:  Stroke Prevention because of Atrial Fibrillation, past history of DVT (deep veing thromobsis), PE (pulmonary embolism) and Protein C and S deficiency (hypercoagulability).   What test will check on my response to Coumadin? While on Coumadin (warfarin) you will need to have an INR test regularly to ensure that your dose is keeping you in the desired range. The INR (international normalized ratio) number is calculated from the result of the laboratory test called prothrombin time (PT).  If an INR APPOINTMENT HAS NOT ALREADY BEEN MADE FOR YOU please schedule an appointment to have this lab work done by your health care provider within 7 days. Your INR goal is usually a number between:  2 to 3 or your provider may give you a more narrow range like 2-2.5.  Ask your health care provider during an office visit what your goal INR is.  What  do you need to  know  About  COUMADIN? Take Coumadin (warfarin) exactly as prescribed by your healthcare provider about the same time each day.  DO NOT stop taking without talking to the doctor who prescribed the medication.  Stopping without other blood clot prevention medication to take the place of Coumadin may increase your risk of developing a new clot or stroke.  Get refills before you run out.  What do you do if you miss a dose? If you miss a dose, take it as soon as you remember on the same day then continue your  regularly scheduled regimen the next day.  Do not take two doses of Coumadin at the same time.  Important Safety Information A possible side effect of Coumadin (Warfarin) is an increased risk of bleeding. You should call your healthcare provider right away if you experience any of the following: Bleeding from an injury or your nose that does not stop. Unusual colored urine (red or dark brown) or unusual colored stools (red or black). Unusual bruising for unknown reasons. A serious fall or if you hit your head (even if there is no bleeding).  Some foods or medicines interact with Coumadin (warfarin) and might alter your response to warfarin. To help avoid this: Eat a balanced diet, maintaining a consistent amount of Vitamin K. Notify your provider about major diet changes you plan to make. Avoid alcohol or limit your intake to 1 drink for women and 2 drinks for men per day. (1 drink is 5 oz. wine, 12 oz. beer, or 1.5 oz. liquor.)  Make sure that ANY health care provider who prescribes medication for you knows that you are taking Coumadin (warfarin).  Also make sure the healthcare provider who is monitoring your Coumadin knows when you have started a new medication including herbals and non-prescription products.  Coumadin (Warfarin)  Major Drug Interactions  Increased Warfarin Effect Decreased Warfarin Effect  Alcohol (large quantities) Antibiotics (esp. Septra/Bactrim, Flagyl, Cipro) Amiodarone (Cordarone) Aspirin (ASA) Cimetidine (Tagamet) Megestrol (Megace) NSAIDs (ibuprofen, naproxen, etc.) Piroxicam (Feldene) Propafenone (  Rythmol SR) Propranolol (Inderal) Isoniazid (INH) Posaconazole (Noxafil) Barbiturates (Phenobarbital) Carbamazepine (Tegretol) Chlordiazepoxide (Librium) Cholestyramine (Questran) Griseofulvin Oral Contraceptives Rifampin Sucralfate (Carafate) Vitamin K   Coumadin (Warfarin) Major Herbal Interactions  Increased Warfarin Effect Decreased Warfarin  Effect  Garlic Ginseng Ginkgo biloba Coenzyme Q10 Green tea St. Johns wort    Coumadin (Warfarin) FOOD Interactions  Eat a consistent number of servings per week of foods HIGH in Vitamin K (1 serving =  cup)  Collards (cooked, or boiled & drained) Kale (cooked, or boiled & drained) Mustard greens (cooked, or boiled & drained) Parsley *serving size only =  cup Spinach (cooked, or boiled & drained) Swiss chard (cooked, or boiled & drained) Turnip greens (cooked, or boiled & drained)  Eat a consistent number of servings per week of foods MEDIUM-HIGH in Vitamin K (1 serving = 1 cup)  Asparagus (cooked, or boiled & drained) Broccoli (cooked, boiled & drained, or raw & chopped) Brussel sprouts (cooked, or boiled & drained) *serving size only =  cup Lettuce, raw (green leaf, endive, romaine) Spinach, raw Turnip greens, raw & chopped   These websites have more information on Coumadin (warfarin):  FailFactory.se; VeganReport.com.au;

## 2021-11-11 NOTE — Progress Notes (Signed)
ANTICOAGULATION CONSULT NOTE - Follow-up Consult  Pharmacy Consult for Coumadin Indication:  Afib, h/o VTE, hypercoaguable d/o  No Known Allergies  Patient Measurements: Height: 5\' 6"  (167.6 cm) Weight: 103.4 kg (228 lb) IBW/kg (Calculated) : 63.8  Vital Signs: Temp: 98 F (36.7 C) (02/20 0912) Temp Source: Oral (02/20 0912) BP: 111/53 (02/20 0912) Pulse Rate: 72 (02/20 0912)  Labs: Recent Labs    11/09/21 1325 11/09/21 1514 11/09/21 1750 11/10/21 0808 11/11/21 0056  HGB 9.4*  --  9.0* 10.2* 9.5*  HCT 29.6*  --  29.0* 31.1* 29.5*  PLT 467*  --   --  456* 434*  LABPROT 22.8*  --   --  21.3* 19.3*  INR 2.0*  --   --  1.9* 1.6*  CREATININE 1.12  --   --   --  1.03  TROPONINIHS  --  13 14  --   --      Estimated Creatinine Clearance: 64.4 mL/min (by C-G formula based on SCr of 1.03 mg/dL).   Medical History: Past Medical History:  Diagnosis Date   BPH associated with nocturia    Chronic constipation    COPD (chronic obstructive pulmonary disease) (HCC)    Depression    Eczema    Heart murmur    at birth   History of adenomatous polyp of colon    History of basal cell carcinoma (BCC) of skin    post excision from trunk   History of CVA (cerebrovascular accident) without residual deficits    11-06-2017 per pt and wife "stroke was approx. 20 years ago , 1999, caused by blood clot after knee scope"  DVT to PE   History of depression    over 40 years ago nervous breakdown   History of DVT (deep vein thrombosis)    11-06-2017 per pt and wife happened lower leg approx. 1999 after knee scope   History of pulmonary embolus (PE)    11-06-2017 from DVT in approx. 1999 per wife and pt   HOH (hard of hearing)    Hyperlipidemia    Hypertension    followed by pcp   Long term (current) use of anticoagulants Coumadin-- followed by Pharmasist w/ [facility]   secondary to primary hypercoagulopathy w/ hx DVT and PE   Myalgia and myositis, unspecified    OA  (osteoarthritis)    "all joints"   PAF (paroxysmal atrial fibrillation) ([place]) 2019   Peripheral neuropathy    Pinched nerve in neck    per pt causes intermittant numbness bilateral arms and hands   Primary hypercoagulable state ([place])    Prostate cancer [facility]) urologist-  dr Alyson Ingles  oncologist-- dr Tammi Klippel   dx 08-28-2017  Stage T1c, Gleason 4+4, PSA 14.1, vol 42.8cc-- planned treatment external radiation and ADT   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[place])    Protein S deficiency ([place])    Right bundle branch block    Seborrheic dermatitis, unspecified    Stroke ([place])    Type 2 diabetes mellitus ([place])    Urinary frequency    Wears glasses     Assessment: 81yo male c/o SOB that worsens with exertion >> admitted for symptomatic anemia likely d/t blood loss after recent  left hip surgery 10/31/21.    On 11/10/21, continued Coumadin for Afib, h/o VTE, and hypercoaguability (Protein C&S deficiency) as  Hgb remained stable. Today INR is down to 1.6.  INR likely down due to warfarin dose held 2/18 on admission. Today 11/11/21 the  Hgb  is  9.5 stable, and pltc stable in 400s.  No bleeding reported  Coumadin was resumed yesterday 2/19 with usual PTA dose of 15 mg given.    Current INR subtherapeutic at 1.9, Hgb 10.2 s/p 1U PRBCs 2/19.  PTA regimen: 17.5mg  Mondays and 15mg  all other days  Goal of Therapy:  INR 2-3   Plan:  Give 17.5 Warfarin per PTA schedule.  If discharged today, would recommend resume PTA warfarin dose  17.5mg  Mondays and 15mg  all other days with close monitoring of INR outpatient.  Goal INR 2-3 Daily INR/CBC while inpatient. Monitor for signs and symptoms of bleeding  Nicole Cella, [place] Clinical Pharmacist [phone] 11/11/2021 10:49 AM   Please check AMION for all [place] phone numbers After 10:00 PM, call [place] [phone]

## 2021-11-11 NOTE — Discharge Summary (Signed)
DISCHARGE SUMMARY  Colton Miranda  MR#: [id number]  DOB:1940/12/04  Date of Admission: 11/09/2021 Date of Discharge: 11/11/2021  Attending Physician:Kale Rondeau Hennie Duos, MD  Patient's ZRA:QTMA, Emily Filbert., MD  Consults: none   Disposition: D/C home   Follow-up Appts:  Follow-up Information     [facility] Follow up.   Specialty: [place] Why: the office will call to schedule home health visits Contact information: [address] [place] [address] [phone]         Ginger Organ., MD Follow up in 1 week(s).   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 In the ED his Hgb was found to be 9.0, down from 10.1 the week prior. INR was 2.0 CTa chest was negative for PE.   Hospital Course:   Dyspnea on exertion - Possible symptomatic anemia Hgb down from 10.1 to 9.0 over 1 week, but at 9.0 doubt this would lead to signif sx - no evidence of bleeding into hip on CT - CTa w/o evidence of PE - was transfused 1U PRBC in ED - no evidence of ACS - CoViD/Flu negative - Hgb increased appropriately w/ transfusion - no sx to suggest GI bleeding -accounting for dilution hemoglobin essentially stable at time of discharge with no clinical evidence of blood loss and patient asymptomatic   Generalized weakness Likely simply a consequence of his recent signif surgery and deconditioning -deemed safe  for home health PT during this admission -advised patient to avoid pushing exertion too far and to take frequent breaks when working on ambulation   PAF Continue usual meds to include warfarin - rate controlled -INR to be checked on Wednesday 11/13/2021  Constipation Bowel regimen added during this hospital stay -due to a combination of narcotics plus decreased mobility -patient advised to discontinue narcotic and utilize primarily Tylenol for pain as able  Urinary hesitancy Likely complicated by constipation as well as relative immobility -short course of Urecholine added -Flomax added to regimen -patient able to urinate without difficulty at time of discharge -given recent hospital stay and complaints of dysuria I felt it reasonable to empirically treat him with a 5-day course of Macrobid for possible UTI -this agent was chosen specifically because the patient is on warfarin and more commonly used antibiotics carried with him a significant risk for interaction -UA was attempted multiple times during his hospital stay but was not able to be accomplished   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   bethanechol 10 MG tablet Commonly known as: URECHOLINE Take 1 tablet (10 mg total) by mouth 3 (three) times daily.   docusate sodium 100 MG capsule Commonly known as: COLACE Take 100 mg by mouth as needed for constipation.   ketoconazole 2 % cream Commonly known as: NIZORAL Apply 1  application topically daily.   lisinopril 20 MG tablet Commonly known as: ZESTRIL Take 1 tablet (20 mg total) by mouth daily.   metFORMIN 500 MG tablet Commonly known as: GLUCOPHAGE TAKE 1 TABLET BY MOUTH ONCE DAILY WITH BREAKFAST   nitrofurantoin (macrocrystal-monohydrate) 100 MG capsule Commonly known as: Macrobid Take 1 capsule (100 mg total) by mouth 2 (two) times daily for 5 days.   nystatin powder Generic drug: nystatin Apply 1 application topically 2 (two) times daily as needed for irritation.   OneTouch Delica Lancets 18H Misc USE 1  TO CHECK GLUCOSE TWICE DAILY What changed: See the new instructions.   OneTouch Verio test strip Generic drug: glucose blood USE 1 STRIP TO CHECK GLUCOSE TWICE DAILY AS DIRECTED What changed: See the new instructions.   OneTouch Verio w/Device Kit by Does not apply route. Check blood sugar twice daily as directed DX E11.65   polyethylene glycol 17 g packet Commonly known as: MIRALAX / GLYCOLAX Take 17 g by mouth 2 (two) times daily.   senna-docusate 8.6-50 MG tablet Commonly known as: Senokot-S Take 1 tablet by mouth 2 (two) times daily.   simvastatin 20 MG tablet Commonly known as: ZOCOR TAKE 1 TABLET BY MOUTH ONCE DAILY FOR CHOLESTEROL What changed: additional instructions   tamsulosin 0.4 MG Caps capsule Commonly known as: FLOMAX Take 1 capsule (0.4 mg total) by mouth daily. Start taking on: November 12, 2021   traMADol 50 MG tablet Commonly known as: ULTRAM Take one to two tablets by mouth every 6 hours as needed for pain. Do not take more than 6 tablets in 24 hours What changed:  how much to take how to take this when to take this reasons to take this additional instructions   Vitamin D3 50 MCG (2000 UT) Tabs Take 2,000 Units by mouth daily.   warfarin 5 MG tablet Commonly known as: COUMADIN Take as directed. If you are unsure how to take this medication, talk to your nurse or doctor. Original instructions:  Take 5-7.5 mg by mouth See admin instructions. 5 mg on Tuesday,Wednesday,Thursday,Friday,Saturday and Sunday along with 10 mg to equal 64m 7.5 mg on Monday along with 10 mg to equal 17.5 mg What changed: Another medication with the same name was changed. Make sure you understand how and when to take each.   warfarin 10 MG tablet Commonly known as: COUMADIN Take as directed. If you are unsure how to take this medication, talk to your nurse or doctor. Original instructions: TAKE 1 TABLET BY MOUTH ONCE DAILY ALONG  WITH  A  5  MG  TABLET  FOR  A  TOTAL  OF  15  MG  DAILY What changed:  how much to take how to take this when to take this additional instructions        Day of Discharge BP (!) 111/53 (BP Location: Left Arm)    Pulse 72  Temp 98 F (36.7 C) (Oral)    Resp 19    Ht 5' 6"  (1.676 m)    Wt 103.4 kg    SpO2 100%    BMI 36.80 kg/m   Physical Exam: General: No acute respiratory distress Lungs: Clear to auscultation bilaterally without wheezes or crackles Cardiovascular: Regular rate and rhythm without murmur gallop or rub normal S1 and S2 Abdomen: Nontender, nondistended, soft, bowel sounds positive, no rebound, no ascites, no appreciable mass Extremities: No significant cyanosis, clubbing, or edema bilateral lower extremities  Basic Metabolic Panel: Recent Labs  Lab 11/09/21 1325 11/11/21 0056  NA 133* 130*  K 4.3 4.2  CL 101 99  CO2 23 23  GLUCOSE 113* 140*  BUN 20 27*  CREATININE 1.12 1.03  CALCIUM 8.9 8.7*  MG  --  2.0    Liver Function Tests: Recent Labs  Lab 11/11/21 0056  AST 21  ALT 25  ALKPHOS 105  BILITOT 0.6  PROT 6.2*  ALBUMIN 2.7*    Coags: Recent Labs  Lab 11/09/21 1325 11/10/21 0808 11/11/21 0056  INR 2.0* 1.9* 1.6*    CBC: Recent Labs  Lab 11/09/21 1325 11/09/21 1750 11/10/21 0808 11/11/21 0056  WBC 9.0  --  10.8* 10.9*  NEUTROABS 6.8  --   --   --   HGB 9.4* 9.0* 10.2* 9.5*  HCT 29.6* 29.0* 31.1* 29.5*  MCV 86.8  --   85.0 85.0  PLT 467*  --  456* 434*     Time spent in discharge (includes decision making & examination of pt): 35 minutes  11/11/2021, 2:23 PM   Cherene Altes, MD [facility] Office  [phone]

## 2021-11-14 ENCOUNTER — Ambulatory Visit (INDEPENDENT_AMBULATORY_CARE_PROVIDER_SITE_OTHER): Payer: PPO | Admitting: Orthopaedic Surgery

## 2021-11-14 ENCOUNTER — Telehealth: Payer: Self-pay | Admitting: *Deleted

## 2021-11-14 DIAGNOSIS — Z86718 Personal history of other venous thrombosis and embolism: Secondary | ICD-10-CM | POA: Diagnosis not present

## 2021-11-14 DIAGNOSIS — I10 Essential (primary) hypertension: Secondary | ICD-10-CM | POA: Diagnosis not present

## 2021-11-14 DIAGNOSIS — D6869 Other thrombophilia: Secondary | ICD-10-CM | POA: Diagnosis not present

## 2021-11-14 DIAGNOSIS — Z96642 Presence of left artificial hip joint: Secondary | ICD-10-CM

## 2021-11-14 DIAGNOSIS — D62 Acute posthemorrhagic anemia: Secondary | ICD-10-CM | POA: Diagnosis not present

## 2021-11-14 DIAGNOSIS — R0609 Other forms of dyspnea: Secondary | ICD-10-CM | POA: Diagnosis not present

## 2021-11-14 NOTE — Telephone Encounter (Signed)
Ortho bundle 14 day in office meeting completed. °

## 2021-11-14 NOTE — Progress Notes (Signed)
The patient is here today for his first postoperative visit status post a left total hip arthroplasty.  Since surgery he had been hospitalized for what was felt to be acute blood loss anemia but his blood level only dropped down to 9.  They did give him a transfusion in light of his normal vital signs.  He has had a difficult time with his INR.  He is on chronic Coumadin.  Fortunately his primary care physician is following this closely and they have been dosing Lovenox for him in the interim.  His left hip incision looks pretty good.  The staples are removed and Steri-Strips applied.  There is no significant seroma or hematoma.  There is a small opening of the superior aspect of the wound and it showed his wife how to take care of his daily with keeping it dry and placing Bactroban ointment over the wound.  I would like to see him back in a month to make sure he is doing well but no x-rays are needed.  If there are any issues with his incision, I would like to see him before then.

## 2021-11-18 DIAGNOSIS — E1142 Type 2 diabetes mellitus with diabetic polyneuropathy: Secondary | ICD-10-CM | POA: Diagnosis not present

## 2021-11-18 DIAGNOSIS — J449 Chronic obstructive pulmonary disease, unspecified: Secondary | ICD-10-CM | POA: Diagnosis not present

## 2021-11-18 DIAGNOSIS — G4700 Insomnia, unspecified: Secondary | ICD-10-CM | POA: Diagnosis not present

## 2021-11-18 DIAGNOSIS — I4510 Unspecified right bundle-branch block: Secondary | ICD-10-CM | POA: Diagnosis not present

## 2021-11-18 DIAGNOSIS — L309 Dermatitis, unspecified: Secondary | ICD-10-CM | POA: Diagnosis not present

## 2021-11-18 DIAGNOSIS — I480 Paroxysmal atrial fibrillation: Secondary | ICD-10-CM | POA: Diagnosis not present

## 2021-11-18 DIAGNOSIS — Z471 Aftercare following joint replacement surgery: Secondary | ICD-10-CM | POA: Diagnosis not present

## 2021-11-18 DIAGNOSIS — D6859 Other primary thrombophilia: Secondary | ICD-10-CM | POA: Diagnosis not present

## 2021-11-18 DIAGNOSIS — M159 Polyosteoarthritis, unspecified: Secondary | ICD-10-CM | POA: Diagnosis not present

## 2021-11-18 DIAGNOSIS — M609 Myositis, unspecified: Secondary | ICD-10-CM | POA: Diagnosis not present

## 2021-11-18 DIAGNOSIS — N401 Enlarged prostate with lower urinary tract symptoms: Secondary | ICD-10-CM | POA: Diagnosis not present

## 2021-11-18 DIAGNOSIS — D649 Anemia, unspecified: Secondary | ICD-10-CM | POA: Diagnosis not present

## 2021-11-18 DIAGNOSIS — E46 Unspecified protein-calorie malnutrition: Secondary | ICD-10-CM | POA: Diagnosis not present

## 2021-11-18 DIAGNOSIS — M791 Myalgia, unspecified site: Secondary | ICD-10-CM | POA: Diagnosis not present

## 2021-11-18 DIAGNOSIS — K5909 Other constipation: Secondary | ICD-10-CM | POA: Diagnosis not present

## 2021-11-18 DIAGNOSIS — Z6836 Body mass index (BMI) 36.0-36.9, adult: Secondary | ICD-10-CM | POA: Diagnosis not present

## 2021-11-18 DIAGNOSIS — Z96643 Presence of artificial hip joint, bilateral: Secondary | ICD-10-CM | POA: Diagnosis not present

## 2021-11-18 DIAGNOSIS — H9190 Unspecified hearing loss, unspecified ear: Secondary | ICD-10-CM | POA: Diagnosis not present

## 2021-11-18 DIAGNOSIS — E871 Hypo-osmolality and hyponatremia: Secondary | ICD-10-CM | POA: Diagnosis not present

## 2021-11-18 DIAGNOSIS — E785 Hyperlipidemia, unspecified: Secondary | ICD-10-CM | POA: Diagnosis not present

## 2021-11-18 DIAGNOSIS — I10 Essential (primary) hypertension: Secondary | ICD-10-CM | POA: Diagnosis not present

## 2021-11-18 DIAGNOSIS — L219 Seborrheic dermatitis, unspecified: Secondary | ICD-10-CM | POA: Diagnosis not present

## 2021-11-18 DIAGNOSIS — F32A Depression, unspecified: Secondary | ICD-10-CM | POA: Diagnosis not present

## 2021-11-18 DIAGNOSIS — E669 Obesity, unspecified: Secondary | ICD-10-CM | POA: Diagnosis not present

## 2021-11-20 DIAGNOSIS — Z471 Aftercare following joint replacement surgery: Secondary | ICD-10-CM | POA: Diagnosis not present

## 2021-11-20 DIAGNOSIS — D6859 Other primary thrombophilia: Secondary | ICD-10-CM | POA: Diagnosis not present

## 2021-11-20 DIAGNOSIS — Z96643 Presence of artificial hip joint, bilateral: Secondary | ICD-10-CM | POA: Diagnosis not present

## 2021-11-20 DIAGNOSIS — I10 Essential (primary) hypertension: Secondary | ICD-10-CM | POA: Diagnosis not present

## 2021-11-20 DIAGNOSIS — N401 Enlarged prostate with lower urinary tract symptoms: Secondary | ICD-10-CM | POA: Diagnosis not present

## 2021-11-20 DIAGNOSIS — M609 Myositis, unspecified: Secondary | ICD-10-CM | POA: Diagnosis not present

## 2021-11-20 DIAGNOSIS — M159 Polyosteoarthritis, unspecified: Secondary | ICD-10-CM | POA: Diagnosis not present

## 2021-11-20 DIAGNOSIS — H9190 Unspecified hearing loss, unspecified ear: Secondary | ICD-10-CM | POA: Diagnosis not present

## 2021-11-20 DIAGNOSIS — I480 Paroxysmal atrial fibrillation: Secondary | ICD-10-CM | POA: Diagnosis not present

## 2021-11-20 DIAGNOSIS — L309 Dermatitis, unspecified: Secondary | ICD-10-CM | POA: Diagnosis not present

## 2021-11-20 DIAGNOSIS — G4700 Insomnia, unspecified: Secondary | ICD-10-CM | POA: Diagnosis not present

## 2021-11-20 DIAGNOSIS — E46 Unspecified protein-calorie malnutrition: Secondary | ICD-10-CM | POA: Diagnosis not present

## 2021-11-20 DIAGNOSIS — E785 Hyperlipidemia, unspecified: Secondary | ICD-10-CM | POA: Diagnosis not present

## 2021-11-20 DIAGNOSIS — K5909 Other constipation: Secondary | ICD-10-CM | POA: Diagnosis not present

## 2021-11-20 DIAGNOSIS — Z6836 Body mass index (BMI) 36.0-36.9, adult: Secondary | ICD-10-CM | POA: Diagnosis not present

## 2021-11-20 DIAGNOSIS — M791 Myalgia, unspecified site: Secondary | ICD-10-CM | POA: Diagnosis not present

## 2021-11-20 DIAGNOSIS — E871 Hypo-osmolality and hyponatremia: Secondary | ICD-10-CM | POA: Diagnosis not present

## 2021-11-20 DIAGNOSIS — D649 Anemia, unspecified: Secondary | ICD-10-CM | POA: Diagnosis not present

## 2021-11-20 DIAGNOSIS — L219 Seborrheic dermatitis, unspecified: Secondary | ICD-10-CM | POA: Diagnosis not present

## 2021-11-20 DIAGNOSIS — E669 Obesity, unspecified: Secondary | ICD-10-CM | POA: Diagnosis not present

## 2021-11-20 DIAGNOSIS — J449 Chronic obstructive pulmonary disease, unspecified: Secondary | ICD-10-CM | POA: Diagnosis not present

## 2021-11-20 DIAGNOSIS — E1142 Type 2 diabetes mellitus with diabetic polyneuropathy: Secondary | ICD-10-CM | POA: Diagnosis not present

## 2021-11-20 DIAGNOSIS — F32A Depression, unspecified: Secondary | ICD-10-CM | POA: Diagnosis not present

## 2021-11-20 DIAGNOSIS — I4510 Unspecified right bundle-branch block: Secondary | ICD-10-CM | POA: Diagnosis not present

## 2021-11-25 DIAGNOSIS — N401 Enlarged prostate with lower urinary tract symptoms: Secondary | ICD-10-CM | POA: Diagnosis not present

## 2021-11-25 DIAGNOSIS — J449 Chronic obstructive pulmonary disease, unspecified: Secondary | ICD-10-CM | POA: Diagnosis not present

## 2021-11-25 DIAGNOSIS — K5909 Other constipation: Secondary | ICD-10-CM | POA: Diagnosis not present

## 2021-11-25 DIAGNOSIS — M609 Myositis, unspecified: Secondary | ICD-10-CM | POA: Diagnosis not present

## 2021-11-25 DIAGNOSIS — D6859 Other primary thrombophilia: Secondary | ICD-10-CM | POA: Diagnosis not present

## 2021-11-25 DIAGNOSIS — M791 Myalgia, unspecified site: Secondary | ICD-10-CM | POA: Diagnosis not present

## 2021-11-25 DIAGNOSIS — Z96643 Presence of artificial hip joint, bilateral: Secondary | ICD-10-CM | POA: Diagnosis not present

## 2021-11-25 DIAGNOSIS — I480 Paroxysmal atrial fibrillation: Secondary | ICD-10-CM | POA: Diagnosis not present

## 2021-11-25 DIAGNOSIS — Z6836 Body mass index (BMI) 36.0-36.9, adult: Secondary | ICD-10-CM | POA: Diagnosis not present

## 2021-11-25 DIAGNOSIS — F32A Depression, unspecified: Secondary | ICD-10-CM | POA: Diagnosis not present

## 2021-11-25 DIAGNOSIS — M159 Polyosteoarthritis, unspecified: Secondary | ICD-10-CM | POA: Diagnosis not present

## 2021-11-25 DIAGNOSIS — E871 Hypo-osmolality and hyponatremia: Secondary | ICD-10-CM | POA: Diagnosis not present

## 2021-11-25 DIAGNOSIS — I10 Essential (primary) hypertension: Secondary | ICD-10-CM | POA: Diagnosis not present

## 2021-11-25 DIAGNOSIS — L219 Seborrheic dermatitis, unspecified: Secondary | ICD-10-CM | POA: Diagnosis not present

## 2021-11-25 DIAGNOSIS — E46 Unspecified protein-calorie malnutrition: Secondary | ICD-10-CM | POA: Diagnosis not present

## 2021-11-25 DIAGNOSIS — E1142 Type 2 diabetes mellitus with diabetic polyneuropathy: Secondary | ICD-10-CM | POA: Diagnosis not present

## 2021-11-25 DIAGNOSIS — L309 Dermatitis, unspecified: Secondary | ICD-10-CM | POA: Diagnosis not present

## 2021-11-25 DIAGNOSIS — Z471 Aftercare following joint replacement surgery: Secondary | ICD-10-CM | POA: Diagnosis not present

## 2021-11-25 DIAGNOSIS — G4700 Insomnia, unspecified: Secondary | ICD-10-CM | POA: Diagnosis not present

## 2021-11-25 DIAGNOSIS — I4510 Unspecified right bundle-branch block: Secondary | ICD-10-CM | POA: Diagnosis not present

## 2021-11-25 DIAGNOSIS — H9190 Unspecified hearing loss, unspecified ear: Secondary | ICD-10-CM | POA: Diagnosis not present

## 2021-11-25 DIAGNOSIS — D649 Anemia, unspecified: Secondary | ICD-10-CM | POA: Diagnosis not present

## 2021-11-25 DIAGNOSIS — E785 Hyperlipidemia, unspecified: Secondary | ICD-10-CM | POA: Diagnosis not present

## 2021-11-25 DIAGNOSIS — E669 Obesity, unspecified: Secondary | ICD-10-CM | POA: Diagnosis not present

## 2021-11-28 ENCOUNTER — Telehealth: Payer: Self-pay | Admitting: *Deleted

## 2021-11-28 NOTE — Telephone Encounter (Signed)
Ortho bundle 30 day call to patient; discussed with wife continuing to cleanse opened area at top of incision daily and place Bactroban ointment on this. Keep dry. She states she is doing this. Dr. Ninfa Linden wanted appt early next week with PA. Appt scheduled for 12/02/21 at 1:00 pm with Benita Stabile, PA-C. ?

## 2021-12-02 ENCOUNTER — Ambulatory Visit (INDEPENDENT_AMBULATORY_CARE_PROVIDER_SITE_OTHER): Payer: PPO | Admitting: Physician Assistant

## 2021-12-02 ENCOUNTER — Encounter: Payer: Self-pay | Admitting: Physician Assistant

## 2021-12-02 DIAGNOSIS — Z96642 Presence of left artificial hip joint: Secondary | ICD-10-CM

## 2021-12-02 NOTE — Progress Notes (Signed)
HPI: Colton Miranda returns today for wound check left hip.  Status post left total hip arthroplasty 923.  He was ambulating with a walker.  He has been applying Bactroban to the wound.  Negative for fevers or chills.  Notes leg feel head feels heavy at times. ? ?Physical exam: Left hip incision proximal incision slight dehiscence of the proximal wound.  There is fibrinous tissue in this region.  No drainage or erythema.  Left calf supple nontender.  He has limited internal and external rotation of the left hip. ? ?Impression: Status post left total hip arthroplasty 10/31/2021 ? ?Plan: He will continue to apply a small amount of Bactroban over the proximal incision.  He is to wash the incision daily with an antibacterial soap.  Feel dry completely before applying the Bactroban.  Then he is to apply a gauze sponge in the proximal incision region tape is in place with paper tape.  He will follow-up with Dr. Ninfa Linden as scheduled in approximately 10 days for wound check. ?

## 2021-12-05 DIAGNOSIS — E871 Hypo-osmolality and hyponatremia: Secondary | ICD-10-CM | POA: Diagnosis not present

## 2021-12-05 DIAGNOSIS — E669 Obesity, unspecified: Secondary | ICD-10-CM | POA: Diagnosis not present

## 2021-12-05 DIAGNOSIS — D6859 Other primary thrombophilia: Secondary | ICD-10-CM | POA: Diagnosis not present

## 2021-12-05 DIAGNOSIS — D649 Anemia, unspecified: Secondary | ICD-10-CM | POA: Diagnosis not present

## 2021-12-05 DIAGNOSIS — L89152 Pressure ulcer of sacral region, stage 2: Secondary | ICD-10-CM | POA: Diagnosis not present

## 2021-12-05 DIAGNOSIS — I480 Paroxysmal atrial fibrillation: Secondary | ICD-10-CM | POA: Diagnosis not present

## 2021-12-05 DIAGNOSIS — F32A Depression, unspecified: Secondary | ICD-10-CM | POA: Diagnosis not present

## 2021-12-05 DIAGNOSIS — E1142 Type 2 diabetes mellitus with diabetic polyneuropathy: Secondary | ICD-10-CM | POA: Diagnosis not present

## 2021-12-05 DIAGNOSIS — Z471 Aftercare following joint replacement surgery: Secondary | ICD-10-CM | POA: Diagnosis not present

## 2021-12-05 DIAGNOSIS — E46 Unspecified protein-calorie malnutrition: Secondary | ICD-10-CM | POA: Diagnosis not present

## 2021-12-05 DIAGNOSIS — Z96643 Presence of artificial hip joint, bilateral: Secondary | ICD-10-CM | POA: Diagnosis not present

## 2021-12-05 DIAGNOSIS — Z7901 Long term (current) use of anticoagulants: Secondary | ICD-10-CM | POA: Diagnosis not present

## 2021-12-05 DIAGNOSIS — J449 Chronic obstructive pulmonary disease, unspecified: Secondary | ICD-10-CM | POA: Diagnosis not present

## 2021-12-05 DIAGNOSIS — H9190 Unspecified hearing loss, unspecified ear: Secondary | ICD-10-CM | POA: Diagnosis not present

## 2021-12-05 DIAGNOSIS — E785 Hyperlipidemia, unspecified: Secondary | ICD-10-CM | POA: Diagnosis not present

## 2021-12-05 DIAGNOSIS — M1990 Unspecified osteoarthritis, unspecified site: Secondary | ICD-10-CM | POA: Diagnosis not present

## 2021-12-05 DIAGNOSIS — N401 Enlarged prostate with lower urinary tract symptoms: Secondary | ICD-10-CM | POA: Diagnosis not present

## 2021-12-05 DIAGNOSIS — G4700 Insomnia, unspecified: Secondary | ICD-10-CM | POA: Diagnosis not present

## 2021-12-05 DIAGNOSIS — K5909 Other constipation: Secondary | ICD-10-CM | POA: Diagnosis not present

## 2021-12-05 DIAGNOSIS — M791 Myalgia, unspecified site: Secondary | ICD-10-CM | POA: Diagnosis not present

## 2021-12-05 DIAGNOSIS — I4510 Unspecified right bundle-branch block: Secondary | ICD-10-CM | POA: Diagnosis not present

## 2021-12-05 DIAGNOSIS — D6869 Other thrombophilia: Secondary | ICD-10-CM | POA: Diagnosis not present

## 2021-12-05 DIAGNOSIS — I10 Essential (primary) hypertension: Secondary | ICD-10-CM | POA: Diagnosis not present

## 2021-12-05 DIAGNOSIS — L309 Dermatitis, unspecified: Secondary | ICD-10-CM | POA: Diagnosis not present

## 2021-12-05 DIAGNOSIS — Z6836 Body mass index (BMI) 36.0-36.9, adult: Secondary | ICD-10-CM | POA: Diagnosis not present

## 2021-12-05 DIAGNOSIS — L219 Seborrheic dermatitis, unspecified: Secondary | ICD-10-CM | POA: Diagnosis not present

## 2021-12-05 DIAGNOSIS — Z7409 Other reduced mobility: Secondary | ICD-10-CM | POA: Diagnosis not present

## 2021-12-05 DIAGNOSIS — M609 Myositis, unspecified: Secondary | ICD-10-CM | POA: Diagnosis not present

## 2021-12-05 DIAGNOSIS — M159 Polyosteoarthritis, unspecified: Secondary | ICD-10-CM | POA: Diagnosis not present

## 2021-12-05 DIAGNOSIS — Z86718 Personal history of other venous thrombosis and embolism: Secondary | ICD-10-CM | POA: Diagnosis not present

## 2021-12-09 DIAGNOSIS — I10 Essential (primary) hypertension: Secondary | ICD-10-CM | POA: Diagnosis not present

## 2021-12-09 DIAGNOSIS — Z125 Encounter for screening for malignant neoplasm of prostate: Secondary | ICD-10-CM | POA: Diagnosis not present

## 2021-12-09 DIAGNOSIS — E1169 Type 2 diabetes mellitus with other specified complication: Secondary | ICD-10-CM | POA: Diagnosis not present

## 2021-12-09 DIAGNOSIS — E785 Hyperlipidemia, unspecified: Secondary | ICD-10-CM | POA: Diagnosis not present

## 2021-12-12 ENCOUNTER — Encounter: Payer: PPO | Admitting: Orthopaedic Surgery

## 2021-12-12 ENCOUNTER — Encounter: Payer: Self-pay | Admitting: Orthopaedic Surgery

## 2021-12-12 ENCOUNTER — Ambulatory Visit (INDEPENDENT_AMBULATORY_CARE_PROVIDER_SITE_OTHER): Payer: PPO | Admitting: Orthopaedic Surgery

## 2021-12-12 DIAGNOSIS — Z96642 Presence of left artificial hip joint: Secondary | ICD-10-CM

## 2021-12-12 NOTE — Progress Notes (Signed)
The patient is 6 weeks today status post a left total hip arthroplasty.  He is coming in today for a wound check.  He is 81 years old.  He is mobilizing well.  He has had some dehiscence of the superior aspect of his left hip incision at the groin crease. ? ?On exam there is some dehisced area.  He has been working with placing mupirocin ointment on the wound daily.  There is no evidence of infection at all.  I did clean the area up as well. ? ?He will continue local wound care to this area.  I would like to see him back in 2 weeks for repeat wound check.  All question concerns were answered and addressed. ?

## 2021-12-16 DIAGNOSIS — G3184 Mild cognitive impairment, so stated: Secondary | ICD-10-CM | POA: Diagnosis not present

## 2021-12-16 DIAGNOSIS — I480 Paroxysmal atrial fibrillation: Secondary | ICD-10-CM | POA: Diagnosis not present

## 2021-12-16 DIAGNOSIS — Z1339 Encounter for screening examination for other mental health and behavioral disorders: Secondary | ICD-10-CM | POA: Diagnosis not present

## 2021-12-16 DIAGNOSIS — E785 Hyperlipidemia, unspecified: Secondary | ICD-10-CM | POA: Diagnosis not present

## 2021-12-16 DIAGNOSIS — Z1331 Encounter for screening for depression: Secondary | ICD-10-CM | POA: Diagnosis not present

## 2021-12-16 DIAGNOSIS — Z86718 Personal history of other venous thrombosis and embolism: Secondary | ICD-10-CM | POA: Diagnosis not present

## 2021-12-16 DIAGNOSIS — I77811 Abdominal aortic ectasia: Secondary | ICD-10-CM | POA: Diagnosis not present

## 2021-12-16 DIAGNOSIS — Z7901 Long term (current) use of anticoagulants: Secondary | ICD-10-CM | POA: Diagnosis not present

## 2021-12-16 DIAGNOSIS — I10 Essential (primary) hypertension: Secondary | ICD-10-CM | POA: Diagnosis not present

## 2021-12-16 DIAGNOSIS — Z0000 Encounter for general adult medical examination without abnormal findings: Secondary | ICD-10-CM | POA: Diagnosis not present

## 2021-12-16 DIAGNOSIS — D6869 Other thrombophilia: Secondary | ICD-10-CM | POA: Diagnosis not present

## 2021-12-16 DIAGNOSIS — R82998 Other abnormal findings in urine: Secondary | ICD-10-CM | POA: Diagnosis not present

## 2021-12-16 DIAGNOSIS — E1169 Type 2 diabetes mellitus with other specified complication: Secondary | ICD-10-CM | POA: Diagnosis not present

## 2021-12-18 DIAGNOSIS — L602 Onychogryphosis: Secondary | ICD-10-CM | POA: Diagnosis not present

## 2021-12-18 DIAGNOSIS — L84 Corns and callosities: Secondary | ICD-10-CM | POA: Diagnosis not present

## 2021-12-18 DIAGNOSIS — E1151 Type 2 diabetes mellitus with diabetic peripheral angiopathy without gangrene: Secondary | ICD-10-CM | POA: Diagnosis not present

## 2021-12-26 ENCOUNTER — Encounter: Payer: Self-pay | Admitting: Physician Assistant

## 2021-12-26 ENCOUNTER — Ambulatory Visit (INDEPENDENT_AMBULATORY_CARE_PROVIDER_SITE_OTHER): Payer: PPO | Admitting: Physician Assistant

## 2021-12-26 DIAGNOSIS — Z96642 Presence of left artificial hip joint: Secondary | ICD-10-CM

## 2021-12-26 DIAGNOSIS — B3789 Other sites of candidiasis: Secondary | ICD-10-CM

## 2021-12-26 MED ORDER — FLUCONAZOLE 100 MG PO TABS
100.0000 mg | ORAL_TABLET | Freq: Every day | ORAL | 0 refills | Status: DC
Start: 2021-12-26 — End: 2022-02-07

## 2021-12-26 NOTE — Progress Notes (Signed)
HPI: Patient returns today status post left total hip arthroplasty 10/31/2021.  He is here for wound check.  He states overall the hip is doing well he still has some numbness about the thigh.  Knee has been complying back to trace and washing the wound with antibacterial soap.  He unfortunately is dealing with yeast in the groin region and has been applying nystatin powder.  Negative for fevers or chills. ? ?Physical exam: Proximal incision good granulation of the dehiscence area.  No signs of infection.  He has significant candidiasis of the groin region.  Good range of motion of the left hip without pain. ? ?Impression: Status post left total hip arthroplasty 10/31/2021 ?Groin candidiasis ? ?Plan: He will continue current wound care to the proximal incision.  We will place him on fluconazole 100 mg once daily for 3 days to help with the candidiasis.  He will follow-up with [name] in 3 weeks sooner if there is any questions or concerns.  Questions were encouraged and answered ?

## 2022-01-05 DIAGNOSIS — M1990 Unspecified osteoarthritis, unspecified site: Secondary | ICD-10-CM | POA: Diagnosis not present

## 2022-01-05 DIAGNOSIS — L89152 Pressure ulcer of sacral region, stage 2: Secondary | ICD-10-CM | POA: Diagnosis not present

## 2022-01-05 DIAGNOSIS — Z7409 Other reduced mobility: Secondary | ICD-10-CM | POA: Diagnosis not present

## 2022-01-09 DIAGNOSIS — D6869 Other thrombophilia: Secondary | ICD-10-CM | POA: Diagnosis not present

## 2022-01-09 DIAGNOSIS — Z7901 Long term (current) use of anticoagulants: Secondary | ICD-10-CM | POA: Diagnosis not present

## 2022-01-09 DIAGNOSIS — Z86718 Personal history of other venous thrombosis and embolism: Secondary | ICD-10-CM | POA: Diagnosis not present

## 2022-01-09 DIAGNOSIS — I480 Paroxysmal atrial fibrillation: Secondary | ICD-10-CM | POA: Diagnosis not present

## 2022-01-16 ENCOUNTER — Ambulatory Visit (INDEPENDENT_AMBULATORY_CARE_PROVIDER_SITE_OTHER): Payer: PPO | Admitting: Orthopaedic Surgery

## 2022-01-16 ENCOUNTER — Encounter: Payer: Self-pay | Admitting: Orthopaedic Surgery

## 2022-01-16 DIAGNOSIS — Z96642 Presence of left artificial hip joint: Secondary | ICD-10-CM

## 2022-01-16 NOTE — Progress Notes (Signed)
The patient is 11 weeks status post a left total hip arthroplasty.  He is getting better in terms of his mobility.  He is now down to using her quad cane. ? ?His left hip incision site looks good especially the groin crease where it is very sweaty.  The wound is healed up nicely. ? ?He will continue to increase his activities as comfort allows.  He will try to keep the groin crease area dry.  I recommended some type of garment or gauze in that area daily change. ? ?At this point we will see him back in 3 months.  At that visit we will have an AP pelvis and lateral of the left operative hip.  If there are issues before then they know to let us know. ?

## 2022-01-30 DIAGNOSIS — C61 Malignant neoplasm of prostate: Secondary | ICD-10-CM | POA: Diagnosis not present

## 2022-02-04 DIAGNOSIS — M1990 Unspecified osteoarthritis, unspecified site: Secondary | ICD-10-CM | POA: Diagnosis not present

## 2022-02-04 DIAGNOSIS — L89152 Pressure ulcer of sacral region, stage 2: Secondary | ICD-10-CM | POA: Diagnosis not present

## 2022-02-04 DIAGNOSIS — Z7409 Other reduced mobility: Secondary | ICD-10-CM | POA: Diagnosis not present

## 2022-02-06 DIAGNOSIS — R3915 Urgency of urination: Secondary | ICD-10-CM | POA: Diagnosis not present

## 2022-02-06 DIAGNOSIS — C61 Malignant neoplasm of prostate: Secondary | ICD-10-CM | POA: Diagnosis not present

## 2022-02-07 ENCOUNTER — Emergency Department (HOSPITAL_COMMUNITY): Payer: PPO

## 2022-02-07 ENCOUNTER — Other Ambulatory Visit: Payer: Self-pay

## 2022-02-07 ENCOUNTER — Encounter (HOSPITAL_COMMUNITY): Payer: Self-pay | Admitting: Internal Medicine

## 2022-02-07 ENCOUNTER — Observation Stay (HOSPITAL_COMMUNITY)
Admission: EM | Admit: 2022-02-07 | Discharge: 2022-02-08 | Disposition: A | Discharge status: Home / Self Care | Payer: PPO | Attending: Internal Medicine | Admitting: Internal Medicine

## 2022-02-07 DIAGNOSIS — I482 Chronic atrial fibrillation, unspecified: Secondary | ICD-10-CM | POA: Insufficient documentation

## 2022-02-07 DIAGNOSIS — Z85828 Personal history of other malignant neoplasm of skin: Secondary | ICD-10-CM | POA: Diagnosis not present

## 2022-02-07 DIAGNOSIS — E119 Type 2 diabetes mellitus without complications: Secondary | ICD-10-CM

## 2022-02-07 DIAGNOSIS — Z86718 Personal history of other venous thrombosis and embolism: Secondary | ICD-10-CM | POA: Insufficient documentation

## 2022-02-07 DIAGNOSIS — I480 Paroxysmal atrial fibrillation: Secondary | ICD-10-CM

## 2022-02-07 DIAGNOSIS — R000 Tachycardia, unspecified: Secondary | ICD-10-CM | POA: Diagnosis not present

## 2022-02-07 DIAGNOSIS — R55 Syncope and collapse: Principal | ICD-10-CM | POA: Diagnosis present

## 2022-02-07 DIAGNOSIS — R42 Dizziness and giddiness: Secondary | ICD-10-CM | POA: Diagnosis not present

## 2022-02-07 DIAGNOSIS — Z86711 Personal history of pulmonary embolism: Secondary | ICD-10-CM | POA: Insufficient documentation

## 2022-02-07 DIAGNOSIS — L03116 Cellulitis of left lower limb: Secondary | ICD-10-CM | POA: Diagnosis not present

## 2022-02-07 DIAGNOSIS — E785 Hyperlipidemia, unspecified: Secondary | ICD-10-CM | POA: Diagnosis present

## 2022-02-07 DIAGNOSIS — L8990 Pressure ulcer of unspecified site, unspecified stage: Secondary | ICD-10-CM | POA: Diagnosis not present

## 2022-02-07 DIAGNOSIS — L03115 Cellulitis of right lower limb: Secondary | ICD-10-CM | POA: Diagnosis not present

## 2022-02-07 DIAGNOSIS — Z87891 Personal history of nicotine dependence: Secondary | ICD-10-CM | POA: Insufficient documentation

## 2022-02-07 DIAGNOSIS — I959 Hypotension, unspecified: Secondary | ICD-10-CM | POA: Diagnosis not present

## 2022-02-07 DIAGNOSIS — Z7901 Long term (current) use of anticoagulants: Secondary | ICD-10-CM | POA: Diagnosis not present

## 2022-02-07 DIAGNOSIS — Z96652 Presence of left artificial knee joint: Secondary | ICD-10-CM | POA: Diagnosis not present

## 2022-02-07 DIAGNOSIS — Z8546 Personal history of malignant neoplasm of prostate: Secondary | ICD-10-CM | POA: Insufficient documentation

## 2022-02-07 DIAGNOSIS — Z8673 Personal history of transient ischemic attack (TIA), and cerebral infarction without residual deficits: Secondary | ICD-10-CM | POA: Diagnosis not present

## 2022-02-07 DIAGNOSIS — J449 Chronic obstructive pulmonary disease, unspecified: Secondary | ICD-10-CM | POA: Diagnosis not present

## 2022-02-07 DIAGNOSIS — R001 Bradycardia, unspecified: Secondary | ICD-10-CM

## 2022-02-07 DIAGNOSIS — Z96643 Presence of artificial hip joint, bilateral: Secondary | ICD-10-CM | POA: Insufficient documentation

## 2022-02-07 DIAGNOSIS — Z85038 Personal history of other malignant neoplasm of large intestine: Secondary | ICD-10-CM | POA: Insufficient documentation

## 2022-02-07 DIAGNOSIS — I10 Essential (primary) hypertension: Secondary | ICD-10-CM | POA: Diagnosis present

## 2022-02-07 DIAGNOSIS — I700 Atherosclerosis of aorta: Secondary | ICD-10-CM | POA: Diagnosis not present

## 2022-02-07 DIAGNOSIS — K551 Chronic vascular disorders of intestine: Secondary | ICD-10-CM | POA: Diagnosis not present

## 2022-02-07 DIAGNOSIS — I639 Cerebral infarction, unspecified: Secondary | ICD-10-CM | POA: Diagnosis not present

## 2022-02-07 DIAGNOSIS — N281 Cyst of kidney, acquired: Secondary | ICD-10-CM | POA: Diagnosis not present

## 2022-02-07 DIAGNOSIS — D649 Anemia, unspecified: Secondary | ICD-10-CM | POA: Diagnosis not present

## 2022-02-07 DIAGNOSIS — Z79899 Other long term (current) drug therapy: Secondary | ICD-10-CM | POA: Diagnosis not present

## 2022-02-07 DIAGNOSIS — J9811 Atelectasis: Secondary | ICD-10-CM | POA: Diagnosis not present

## 2022-02-07 DIAGNOSIS — I4510 Unspecified right bundle-branch block: Secondary | ICD-10-CM | POA: Diagnosis not present

## 2022-02-07 DIAGNOSIS — D6859 Other primary thrombophilia: Secondary | ICD-10-CM | POA: Diagnosis not present

## 2022-02-07 DIAGNOSIS — S0993XA Unspecified injury of face, initial encounter: Secondary | ICD-10-CM | POA: Diagnosis not present

## 2022-02-07 DIAGNOSIS — I2510 Atherosclerotic heart disease of native coronary artery without angina pectoris: Secondary | ICD-10-CM | POA: Diagnosis not present

## 2022-02-07 DIAGNOSIS — C61 Malignant neoplasm of prostate: Secondary | ICD-10-CM | POA: Diagnosis present

## 2022-02-07 DIAGNOSIS — Z7984 Long term (current) use of oral hypoglycemic drugs: Secondary | ICD-10-CM | POA: Diagnosis not present

## 2022-02-07 LAB — CBC WITH DIFFERENTIAL/PLATELET
Abs Immature Granulocytes: 0.04 10*3/uL (ref 0.00–0.07)
Basophils Absolute: 0.1 10*3/uL (ref 0.0–0.1)
Basophils Relative: 1 %
Eosinophils Absolute: 0.2 10*3/uL (ref 0.0–0.5)
Eosinophils Relative: 2 %
HCT: 38.8 % — ABNORMAL LOW (ref 39.0–52.0)
Hemoglobin: 12 g/dL — ABNORMAL LOW (ref 13.0–17.0)
Immature Granulocytes: 0 %
Lymphocytes Relative: 18 %
Lymphs Abs: 1.6 10*3/uL (ref 0.7–4.0)
MCH: 27.2 pg (ref 26.0–34.0)
MCHC: 30.9 g/dL (ref 30.0–36.0)
MCV: 88 fL (ref 80.0–100.0)
Monocytes Absolute: 0.6 10*3/uL (ref 0.1–1.0)
Monocytes Relative: 7 %
Neutro Abs: 6.6 10*3/uL (ref 1.7–7.7)
Neutrophils Relative %: 72 %
Platelets: 289 10*3/uL (ref 150–400)
RBC: 4.41 MIL/uL (ref 4.22–5.81)
RDW: 14.3 % (ref 11.5–15.5)
WBC: 9.1 10*3/uL (ref 4.0–10.5)
nRBC: 0 % (ref 0.0–0.2)

## 2022-02-07 LAB — COMPREHENSIVE METABOLIC PANEL
ALT: 27 U/L (ref 0–44)
AST: 27 U/L (ref 15–41)
Albumin: 3.4 g/dL — ABNORMAL LOW (ref 3.5–5.0)
Alkaline Phosphatase: 81 U/L (ref 38–126)
Anion gap: 10 (ref 5–15)
BUN: 26 mg/dL — ABNORMAL HIGH (ref 8–23)
CO2: 23 mmol/L (ref 22–32)
Calcium: 9.4 mg/dL (ref 8.9–10.3)
Chloride: 104 mmol/L (ref 98–111)
Creatinine, Ser: 1.33 mg/dL — ABNORMAL HIGH (ref 0.61–1.24)
GFR, Estimated: 54 mL/min — ABNORMAL LOW (ref >60)
Glucose, Bld: 165 mg/dL — ABNORMAL HIGH (ref 70–99)
Potassium: 3.7 mmol/L (ref 3.5–5.1)
Sodium: 137 mmol/L (ref 135–145)
Total Bilirubin: 0.3 mg/dL (ref 0.3–1.2)
Total Protein: 6.8 g/dL (ref 6.5–8.1)

## 2022-02-07 LAB — TYPE AND SCREEN
ABO/RH(D): O POS
Antibody Screen: NEGATIVE

## 2022-02-07 LAB — MRSA NEXT GEN BY PCR, NASAL: MRSA by PCR Next Gen: NOT DETECTED

## 2022-02-07 LAB — LIPASE, BLOOD: Lipase: 53 U/L — ABNORMAL HIGH (ref 11–51)

## 2022-02-07 LAB — I-STAT CHEM 8, ED
BUN: 26 mg/dL — ABNORMAL HIGH (ref 8–23)
Calcium, Ion: 1.11 mmol/L — ABNORMAL LOW (ref 1.15–1.40)
Chloride: 102 mmol/L (ref 98–111)
Creatinine, Ser: 1.2 mg/dL (ref 0.61–1.24)
Glucose, Bld: 162 mg/dL — ABNORMAL HIGH (ref 70–99)
HCT: 39 % (ref 39.0–52.0)
Hemoglobin: 13.3 g/dL (ref 13.0–17.0)
Potassium: 3.7 mmol/L (ref 3.5–5.1)
Sodium: 137 mmol/L (ref 135–145)
TCO2: 22 mmol/L (ref 22–32)

## 2022-02-07 LAB — URINALYSIS, ROUTINE W REFLEX MICROSCOPIC
Bilirubin Urine: NEGATIVE
Glucose, UA: NEGATIVE mg/dL
Hgb urine dipstick: NEGATIVE
Ketones, ur: NEGATIVE mg/dL
Leukocytes,Ua: NEGATIVE
Nitrite: NEGATIVE
Protein, ur: NEGATIVE mg/dL
Specific Gravity, Urine: 1.026 (ref 1.005–1.030)
pH: 5 (ref 5.0–8.0)

## 2022-02-07 LAB — LACTIC ACID, PLASMA
Lactic Acid, Venous: 2.6 mmol/L (ref 0.5–1.9)
Lactic Acid, Venous: 1.6 mmol/L (ref 0.5–1.9)

## 2022-02-07 LAB — GLUCOSE, CAPILLARY: Glucose-Capillary: 224 mg/dL — ABNORMAL HIGH (ref 70–99)

## 2022-02-07 LAB — PROTIME-INR
INR: 3.3 — ABNORMAL HIGH (ref 0.8–1.2)
Prothrombin Time: 33.5 seconds — ABNORMAL HIGH (ref 11.4–15.2)

## 2022-02-07 LAB — MAGNESIUM: Magnesium: 2 mg/dL (ref 1.7–2.4)

## 2022-02-07 LAB — TROPONIN I (HIGH SENSITIVITY)
Troponin I (High Sensitivity): 11 ng/L (ref <18)
Troponin I (High Sensitivity): 9 ng/L (ref <18)

## 2022-02-07 MED ORDER — TRAMADOL HCL 50 MG PO TABS: 50.0000 mg | ORAL_TABLET | Freq: Every evening | ORAL | Status: DC | PRN

## 2022-02-07 MED ORDER — WARFARIN SODIUM 7.5 MG PO TABS
7.5000 mg | ORAL_TABLET | Freq: Once | ORAL | Status: AC
  Administered 2022-02-07: 7.5 mg via ORAL
  Filled 2022-02-07 (×2): qty 1

## 2022-02-07 MED ORDER — DOCUSATE SODIUM 100 MG PO CAPS: 100.0000 mg | ORAL_CAPSULE | Freq: Two times a day (BID) | ORAL | Status: DC | PRN

## 2022-02-07 MED ORDER — SODIUM CHLORIDE 0.9% FLUSH
3.0000 mL | Freq: Two times a day (BID) | INTRAVENOUS | Status: DC
  Administered 2022-02-07 – 2022-02-08 (×2): 3 mL via INTRAVENOUS

## 2022-02-07 MED ORDER — KETOCONAZOLE 2 % EX CREA
1.0000 "application " | TOPICAL_CREAM | Freq: Every day | CUTANEOUS | Status: DC
  Administered 2022-02-08: 1 via TOPICAL
  Filled 2022-02-07: qty 15

## 2022-02-07 MED ORDER — POLYETHYLENE GLYCOL 3350 17 G PO PACK
17.0000 g | PACK | Freq: Two times a day (BID) | ORAL | Status: DC
  Administered 2022-02-07 – 2022-02-08 (×2): 17 g via ORAL
  Filled 2022-02-07 (×2): qty 1

## 2022-02-07 MED ORDER — SENNOSIDES-DOCUSATE SODIUM 8.6-50 MG PO TABS
1.0000 | ORAL_TABLET | Freq: Two times a day (BID) | ORAL | Status: DC
  Administered 2022-02-07 – 2022-02-08 (×2): 1 via ORAL
  Filled 2022-02-07 (×2): qty 1

## 2022-02-07 MED ORDER — TAMSULOSIN HCL 0.4 MG PO CAPS
0.4000 mg | ORAL_CAPSULE | Freq: Every day | ORAL | Status: DC
Start: 2022-02-08 — End: 2022-02-07

## 2022-02-07 MED ORDER — POLYETHYLENE GLYCOL 3350 17 G PO PACK: 17.0000 g | PACK | Freq: Every day | ORAL | Status: DC | PRN

## 2022-02-07 MED ORDER — WARFARIN - PHARMACIST DOSING INPATIENT: Freq: Every day | Status: DC

## 2022-02-07 MED ORDER — IOHEXOL 350 MG/ML SOLN
100.0000 mL | Freq: Once | INTRAVENOUS | Status: AC | PRN
  Administered 2022-02-07: 100 mL via INTRAVENOUS

## 2022-02-07 MED ORDER — CHLORHEXIDINE GLUCONATE CLOTH 2 % EX PADS
6.0000 | MEDICATED_PAD | Freq: Every day | CUTANEOUS | Status: DC
  Administered 2022-02-08: 6 via TOPICAL

## 2022-02-07 MED ORDER — SODIUM CHLORIDE 0.9 % IV BOLUS
1000.0000 mL | Freq: Once | INTRAVENOUS | Status: AC
  Administered 2022-02-07: 1000 mL via INTRAVENOUS

## 2022-02-07 MED ORDER — ACETAMINOPHEN 650 MG RE SUPP: 650.0000 mg | Freq: Four times a day (QID) | RECTAL | Status: DC | PRN

## 2022-02-07 MED ORDER — BETHANECHOL CHLORIDE 10 MG PO TABS: 10.0000 mg | ORAL_TABLET | Freq: Three times a day (TID) | ORAL | Status: DC

## 2022-02-07 MED ORDER — INSULIN ASPART 100 UNIT/ML IJ SOLN: 0.0000 [IU] | Freq: Three times a day (TID) | INTRAMUSCULAR | Status: DC

## 2022-02-07 MED ORDER — SIMVASTATIN 20 MG PO TABS
20.0000 mg | ORAL_TABLET | Freq: Every day | ORAL | Status: DC
  Administered 2022-02-08: 20 mg via ORAL
  Filled 2022-02-07: qty 1

## 2022-02-07 MED ORDER — ACETAMINOPHEN 325 MG PO TABS: 650.0000 mg | ORAL_TABLET | Freq: Four times a day (QID) | ORAL | Status: DC | PRN

## 2022-02-07 NOTE — ED Triage Notes (Addendum)
Pt arrives via [facility] from home for syncopal episode w/ fall. PT was watching TV and got up to go to the bathroom, pt had sudden dizziness and had syncopal episode. Pt fell forward and was found face down by fire dept. Pt is on warfarin (for hx of PE), pt denies hitting his head and states he didn't pass out, initial HR was 30, BP 70/40, pt was diaphoretic. With EMS pt's HR was fluctuating between 40-60, last BP 99/50. 567m NS given. Pt c/o nausea, Ems gave '4mg'$  zofran.

## 2022-02-07 NOTE — Progress Notes (Addendum)
ANTICOAGULATION CONSULT NOTE - Initial Consult  Pharmacy Consult for Warfarin Indication: Paroxysmal atrial fibrillation, hx VTE, secondary hypercoagulable state  No Known Allergies  Patient Measurements:    Vital Signs: Temp: 97.6 F (36.4 C) (05/19 1633) Temp Source: Oral (05/19 1633) BP: 154/84 (05/19 1900) Pulse Rate: 74 (05/19 1900)  Labs: Recent Labs    02/07/22 1648 02/07/22 1659  HGB 12.0* 13.3  HCT 38.8* 39.0  PLT 289  --   LABPROT 33.5*  --   INR 3.3*  --   CREATININE 1.33* 1.20  TROPONINIHS 11  --     CrCl cannot be calculated (Unknown ideal weight.).   Medical History: Past Medical History:  Diagnosis Date   BPH associated with nocturia    Chronic constipation    COPD (chronic obstructive pulmonary disease) (HCC)    Depression    Eczema    Heart murmur    at birth   History of adenomatous polyp of colon    History of basal cell carcinoma (BCC) of skin    post excision from trunk   History of CVA (cerebrovascular accident) without residual deficits    11-06-2017 per pt and wife "stroke was approx. 20 years ago , 1999, caused by blood clot after knee scope"  DVT to PE   History of depression    over 40 years ago nervous breakdown   History of DVT (deep vein thrombosis)    11-06-2017 per pt and wife happened lower leg approx. 1999 after knee scope   History of pulmonary embolus (PE)    11-06-2017 from DVT in approx. 1999 per wife and pt   HOH (hard of hearing)    Hyperlipidemia    Hypertension    followed by pcp   Long term (current) use of anticoagulants Coumadin-- followed by Pharmasist w/ [facility]   secondary to primary hypercoagulopathy w/ hx DVT and PE   Myalgia and myositis, unspecified    OA (osteoarthritis)    "all joints"   PAF (paroxysmal atrial fibrillation) ([place]) 2019   Peripheral neuropathy    Pinched nerve in neck    per pt causes intermittant numbness bilateral arms and hands   Primary hypercoagulable state ([place])     Prostate cancer [facility]) urologist-  dr Alyson Ingles  oncologist-- dr Tammi Klippel   dx 08-28-2017  Stage T1c, Gleason 4+4, PSA 14.1, vol 42.8cc-- planned treatment external radiation and ADT   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HCC)    Protein S deficiency ([place])    Right bundle branch block    Seborrheic dermatitis, unspecified    Stroke ([place])    Type 2 diabetes mellitus (HCC)    Urinary frequency    Wears glasses     Medications:  Scheduled:   bethanechol  10 mg Oral TID   polyethylene glycol  17 g Oral BID   senna-docusate  1 tablet Oral BID   [START ON 02/08/2022] simvastatin  20 mg Oral Daily   sodium chloride flush  3 mL Intravenous Q12H   [START ON 02/08/2022] tamsulosin  0.4 mg Oral Daily    Assessment: 81 yo M presents to ED with fall and hypotension. He is on Warfarin PTA for paroxysmal atrial fibrillation, hx VTE, and hypercoagulable state (protein C and S deficiency). Last PTA Warfarin dose on 5/18 PM - per patient and family. INR today 3.3 - above goal. Hgb 12, plt 289. Pharmacy consulted for Warfarin dosing.   PTA Warfarin Regimen: 17.5 mg on Wednesdays, 15 mg on all other  days   Goal of Therapy:  INR 2-3 Monitor platelets by anticoagulation protocol: Yes   Plan:  Give Warfarin 7.5 mg PO x 1 Check daily INR Monitor CBC and for signs/symptoms of bleeding    Vance Peper, PharmD PGY1 Pharmacy Resident 02/07/2022 7:16 PM   Please check AMION for all [place] phone numbers After 10:00 PM, call [place] [phone]

## 2022-02-07 NOTE — Plan of Care (Signed)
Discussed with patient plan of care for the evening, pain management and admission questions with some teach back displayed.  Talked about activity of best tonight and obtaining orthostatic vital signs in the morning.\ due to his weakness to stand.  Place and explained primo fit to help him rest tonight.  Problem: Education: Goal: Knowledge of General Education information will improve Description: Including pain rating scale, medication(s)/side effects and non-pharmacologic comfort measures Outcome: Progressing   Problem: Activity: Goal: Risk for activity intolerance will decrease Outcome: Progressing

## 2022-02-07 NOTE — Progress Notes (Signed)
Sinus bradycardia with RBBB, intermittent NSVT and PACs. Chances are that he could have conduction disease, but no definite cause for syncope based on EKG and telemetry reviewe by me.  Needs telemetry monitoring. No acute indication for pacemaker at this time. Avoid AV nodal blocking agents.   Nigel Mormon, MD Pager: [phone] Office: [phone]

## 2022-02-07 NOTE — ED Notes (Signed)
Pt transported to CT w/ RN and NT.

## 2022-02-07 NOTE — ED Provider Notes (Signed)
[facility] EMERGENCY DEPARTMENT Provider Note   CSN: [id number] Arrival date & time: 02/07/22  1623     History  Chief Complaint  Patient presents with   Fall   Hypotension    Colton Miranda is a 81 y.o. male.  HPI 81 year old male presents with fall and near syncope.  He has multiple comorbidities which include stroke, diabetes, hypercoagulability on warfarin, hypertension, paroxysmal A-fib.  He states that prior to arrival he was feeling abdominal discomfort like he had a gas bubble and needed to go to the bathroom.  Got up with his walker and while walking he felt acutely dizzy and fell to the ground.  He thinks he might of hit his nose but did not hit his head or lose consciousness.  He never had any headache, chest pain or shortness of breath.  Never felt irregular heartbeat or palpitations.  Was found to have a heart rate in the 30s when EMS arrived and his blood pressure was low in the 70s.  They gave him a small bolus of fluids.  Here he feels better and has no acute complaints besides he still feels the gas bubble in his stomach.  This feels a little more severe than typical gas bubbles but otherwise he gets these fairly often.  He denies any recent history of blood in the stools or melena. Had hip surgery a few months ago.  Home Medications Prior to Admission medications   Medication Sig Start Date End Date Taking? Authorizing Provider  HYDROcodone-acetaminophen (NORCO) 7.5-325 MG tablet Take 1 tablet by mouth every 6 (six) hours as needed for moderate pain. 11/11/21   Mcarthur Rossetti, MD  acetaminophen (TYLENOL) 325 MG tablet Take 325 mg by mouth every 6 (six) hours as needed for moderate pain.    [provider]  bethanechol (URECHOLINE) 10 MG tablet Take 1 tablet (10 mg total) by mouth 3 (three) times daily. 11/11/21   Cherene Altes, MD  Blood Glucose Monitoring Suppl (ONETOUCH VERIO) w/Device KIT by Does not apply route. Check blood  sugar twice daily as directed DX E11.65    [provider]  Cholecalciferol (VITAMIN D3) 50 MCG (2000 UT) TABS Take 2,000 Units by mouth daily.     [provider]  docusate sodium (COLACE) 100 MG capsule Take 100 mg by mouth as needed for constipation.    [provider]  fluconazole (DIFLUCAN) 100 MG tablet Take 1 tablet (100 mg total) by mouth daily. 12/26/21   Pete Pelt, PA-C  ketoconazole (NIZORAL) 2 % cream Apply 1 application topically daily.    [provider]  lisinopril (PRINIVIL,ZESTRIL) 20 MG tablet Take 1 tablet (20 mg total) by mouth daily. 03/30/18   Lauree Chandler, NP  metFORMIN (GLUCOPHAGE) 500 MG tablet TAKE 1 TABLET BY MOUTH ONCE DAILY WITH BREAKFAST Patient taking differently: Take 500 mg by mouth daily with breakfast. 01/25/18   Lauree Chandler, NP  NYSTATIN powder Apply 1 application topically 2 (two) times daily as needed for irritation. 06/04/21   [provider]  Jonetta Speak LANCETS 32T MISC USE 1  TO CHECK GLUCOSE TWICE DAILY Patient taking differently: 2 (two) times daily. 03/02/18   Lauree Chandler, NP  ONETOUCH VERIO test strip USE 1 STRIP TO CHECK GLUCOSE TWICE DAILY AS DIRECTED Patient taking differently: 2 (two) times daily. 03/02/18   Lauree Chandler, NP  polyethylene glycol (MIRALAX / GLYCOLAX) 17 g packet Take 17 g by mouth 2 (  two) times daily. 11/11/21   Cherene Altes, MD  senna-docusate (SENOKOT-S) 8.6-50 MG tablet Take 1 tablet by mouth 2 (two) times daily. 11/11/21   Cherene Altes, MD  simvastatin (ZOCOR) 20 MG tablet TAKE 1 TABLET BY MOUTH ONCE DAILY FOR CHOLESTEROL Patient taking differently: Take 20 mg by mouth daily. 03/02/18   Lauree Chandler, NP  tamsulosin (FLOMAX) 0.4 MG CAPS capsule Take 1 capsule (0.4 mg total) by mouth daily. 11/12/21   Cherene Altes, MD  traMADol (ULTRAM) 50 MG tablet Take one to two tablets by mouth every 6 hours as needed for pain. Do not take more than 6  tablets in 24 hours Patient taking differently: Take 50 mg by mouth at bedtime as needed for moderate pain. 02/17/20   Antonieta Pert, MD  warfarin (COUMADIN) 10 MG tablet TAKE 1 TABLET BY MOUTH ONCE DAILY ALONG  WITH  A  5  MG  TABLET  FOR  A  TOTAL  OF  15  MG  DAILY Patient taking differently: Take 10 mg by mouth daily. 04/14/18   Hendricks Limes, MD  warfarin (COUMADIN) 5 MG tablet Take 5-7.5 mg by mouth See admin instructions. 5 mg on Tuesday,Wednesday,Thursday,Friday,Saturday and Sunday along with 10 mg to equal 24m 7.5 mg on Monday along with 10 mg to equal 17.5 mg    [provider]      Allergies    Patient has no known allergies.    Review of Systems   Review of Systems  Constitutional:  Negative for fever.  HENT:  Negative for nosebleeds.   Respiratory:  Negative for shortness of breath.   Cardiovascular:  Negative for chest pain and palpitations.  Gastrointestinal:  Positive for abdominal pain. Negative for blood in stool.  Neurological:  Positive for light-headedness. Negative for syncope and headaches.   Physical Exam Updated Vital Signs BP 130/67   Pulse 74   Temp 97.6 F (36.4 C) (Oral)   Resp 18   SpO2 98%  Physical Exam Vitals and nursing note reviewed.  Constitutional:      Appearance: He is well-developed. He is not diaphoretic.  HENT:     Head: Normocephalic and atraumatic.     Nose:     Comments: Mild tenderness to nose but no bleeding or swelling. No other maxillo-facial tenderness Eyes:     Extraocular Movements: Extraocular movements intact.     Pupils: Pupils are equal, round, and reactive to light.  Cardiovascular:     Rate and Rhythm: Normal rate and regular rhythm.     Heart sounds: Normal heart sounds.  Pulmonary:     Effort: Pulmonary effort is normal.     Breath sounds: Normal breath sounds. No wheezing or rales.  Abdominal:     Palpations: Abdomen is soft.     Tenderness: There is abdominal tenderness (perhaps mild lower abdominal  tendenress but it's hard to localize - no distention).  Skin:    General: Skin is warm and dry.     Comments: Skin tear to left wrist  Neurological:     Mental Status: He is alert.     Comments: CN 3-12 grossly intact. 5/5 strength in all 4 extremities. Grossly normal sensation. Normal finger to nose.     ED Results / Procedures / Treatments   Labs (all labs ordered are listed, but only abnormal results are displayed) Labs Reviewed  COMPREHENSIVE METABOLIC PANEL - Abnormal; Notable for the following components:  Result Value   Glucose, Bld 165 (*)    BUN 26 (*)    Creatinine, Ser 1.33 (*)    Albumin 3.4 (*)    GFR, Estimated 54 (*)    All other components within normal limits  LIPASE, BLOOD - Abnormal; Notable for the following components:   Lipase 53 (*)    All other components within normal limits  LACTIC ACID, PLASMA - Abnormal; Notable for the following components:   Lactic Acid, Venous 2.6 (*)    All other components within normal limits  CBC WITH DIFFERENTIAL/PLATELET - Abnormal; Notable for the following components:   Hemoglobin 12.0 (*)    HCT 38.8 (*)    All other components within normal limits  PROTIME-INR - Abnormal; Notable for the following components:   Prothrombin Time 33.5 (*)    INR 3.3 (*)    All other components within normal limits  I-STAT CHEM 8, ED - Abnormal; Notable for the following components:   BUN 26 (*)    Glucose, Bld 162 (*)    Calcium, Ion 1.11 (*)    All other components within normal limits  MAGNESIUM  LACTIC ACID, PLASMA  URINALYSIS, ROUTINE W REFLEX MICROSCOPIC  TYPE AND SCREEN  TROPONIN I (HIGH SENSITIVITY)  TROPONIN I (HIGH SENSITIVITY)    EKG EKG Interpretation  Date/Time:  Friday Feb 07 2022 17:56:52 EDT Ventricular Rate:  40 PR Interval:  196 QRS Duration: 153 QT Interval:  443 QTC Calculation: 362 R Axis:   -19 Text Interpretation: Sinus bradycardia Right bundle branch block rate is slower compared to earlier in  the day Confirmed by Sherwood Gambler [phone]) on 02/07/2022 6:14:12 PM  Radiology CT Head Wo Contrast  Result Date: 02/07/2022 CLINICAL DATA:  Head trauma, minor (Age >= 65y); Facial trauma, blunt EXAM: CT HEAD WITHOUT CONTRAST CT MAXILLOFACIAL WITHOUT CONTRAST TECHNIQUE: Multidetector CT imaging of the head and maxillofacial structures were performed using the standard protocol without intravenous contrast. Multiplanar CT image reconstructions of the maxillofacial structures were also generated. RADIATION DOSE REDUCTION: This exam was performed according to the departmental dose-optimization program which includes automated exposure control, adjustment of the mA and/or kV according to patient size and/or use of iterative reconstruction technique. COMPARISON:  Head CT May 2021 FINDINGS: CT HEAD FINDINGS Brain: No acute intracranial hemorrhage, mass effect, or edema. No acute appearing loss of gray-white differentiation. Chronic infarct of the left parietotemporal lobes. Prominence of the ventricles and sulci reflects parenchymal volume loss. Patchy low-density in the supratentorial white matter is nonspecific but may reflect similar mild chronic microvascular ischemic changes. Vascular: There is intracranial atherosclerotic calcification at the skull base. Skull: Unremarkable. Other: Mastoid air cells are clear. CT MAXILLOFACIAL FINDINGS Osseous: No acute facial fracture. Orbits: No intraorbital hematoma. Sinuses: Minor mucosal thickening. Soft tissues: Unremarkable. IMPRESSION: No evidence of acute intracranial injury. No acute facial fracture. Electronically Signed   By: Macy Mis M.D.   On: 02/07/2022 17:51   DG Chest Portable 1 View  Result Date: 02/07/2022 CLINICAL DATA:  Syncope.  Fall, hypotension. EXAM: PORTABLE CHEST 1 VIEW COMPARISON:  Chest CTA earlier today, chest radiograph 11/09/2021 FINDINGS: Low lung volumes. Upper normal heart size accentuated by low volume technique. Subsegmental  bibasilar opacities without confluent consolidation. Bronchovascular crowding. No pneumothorax or pleural effusion. Bilateral shoulder arthropathy. IMPRESSION: Low lung volumes with bibasilar atelectasis. Electronically Signed   By: Keith Rake M.D.   On: 02/07/2022 18:17   CT Angio Chest/Abd/Pel for Dissection W and/or Wo Contrast  Result  Date: 02/07/2022 CLINICAL DATA:  Acute aortic syndrome suspected. Fall. History of prostate cancer. EXAM: CT ANGIOGRAPHY CHEST, ABDOMEN AND PELVIS TECHNIQUE: Non-contrast CT of the chest was initially obtained. Multidetector CT imaging through the chest, abdomen and pelvis was performed using the standard protocol during bolus administration of intravenous contrast. Multiplanar reconstructed images and MIPs were obtained and reviewed to evaluate the vascular anatomy. RADIATION DOSE REDUCTION: This exam was performed according to the departmental dose-optimization program which includes automated exposure control, adjustment of the mA and/or kV according to patient size and/or use of iterative reconstruction technique. CONTRAST:  11m OMNIPAQUE IOHEXOL 350 MG/ML SOLN COMPARISON:  CT angiogram chest 11/09/2021. CT abdomen and pelvis 10/02/2017. FINDINGS: CTA CHEST FINDINGS Cardiovascular: Preferential opacification of the thoracic aorta. No evidence of thoracic aortic aneurysm or dissection. Normal heart size. No pericardial effusion. There are atherosclerotic calcifications of the aorta and coronary arteries. Mediastinum/Nodes: There is a 5 mm hypodense right thyroid nodule which is unchanged. There are no enlarged mediastinal or hilar lymph nodes. Esophagus is within normal limits. Lungs/Pleura: There is minimal dependent atelectasis bilaterally. The lungs are otherwise clear. There is no pleural effusion or pneumothorax. Musculoskeletal: Multilevel degenerative changes affect the spine. No acute fractures are seen. Review of the MIP images confirms the above findings.  CTA ABDOMEN AND PELVIS FINDINGS VASCULAR Aorta: Normal caliber aorta without aneurysm, dissection, vasculitis or significant stenosis. There are severe atherosclerotic calcifications throughout the aorta. Celiac: Patent without evidence of aneurysm, dissection, vasculitis or significant stenosis. SMA: There is severe atherosclerotic calcifications of the origin of the superior mesenteric artery causing mild focal stenosis. Otherwise patent. Renals: Both renal arteries are patent without evidence of aneurysm, dissection, vasculitis, fibromuscular dysplasia or significant stenosis. IMA: Patent without evidence of aneurysm, dissection, vasculitis or significant stenosis. Inflow: Patent without evidence of aneurysm, dissection, vasculitis or significant stenosis. There are atherosclerotic calcifications. Veins: No obvious venous abnormality within the limitations of this arterial phase study. Review of the MIP images confirms the above findings. NON-VASCULAR Hepatobiliary: No focal liver abnormality is seen. Status post cholecystectomy. No biliary dilatation. Pancreas: Unremarkable. No pancreatic ductal dilatation or surrounding inflammatory changes. Spleen: Normal in size without focal abnormality. Adrenals/Urinary Tract: There is a 5.5 cm cyst in the right kidney. There is no hydronephrosis or perinephric fluid. The adrenal glands and bladder are within normal limits. Stomach/Bowel: Stomach is within normal limits. Appendix appears normal. No evidence of bowel wall thickening, distention, or inflammatory changes. There is a large amount of stool throughout the entire colon. Lymphatic: No enlarged lymph nodes are seen. Reproductive: Prostate radiotherapy seeds are present. Other: No abdominal wall hernia or abnormality. No abdominopelvic ascites. Musculoskeletal: Bilateral hip arthroplasties are present. There is some scarring in the anterior thigh. Multilevel degenerative changes affect the spine. Review of the MIP  images confirms the above findings. IMPRESSION: 1. No evidence for aortic aneurysm or dissection. 2. No acute localizing process in the chest, abdomen or pelvis. 3. Large stool burden compatible with constipation. 4.  Aortic Atherosclerosis (ICD10-I70.0). Electronically Signed   By: ARonney AstersM.D.   On: 02/07/2022 17:58   CT Maxillofacial Wo Contrast  Result Date: 02/07/2022 CLINICAL DATA:  Head trauma, minor (Age >= 65y); Facial trauma, blunt EXAM: CT HEAD WITHOUT CONTRAST CT MAXILLOFACIAL WITHOUT CONTRAST TECHNIQUE: Multidetector CT imaging of the head and maxillofacial structures were performed using the standard protocol without intravenous contrast. Multiplanar CT image reconstructions of the maxillofacial structures were also generated. RADIATION DOSE REDUCTION: This exam was performed according to  the departmental dose-optimization program which includes automated exposure control, adjustment of the mA and/or kV according to patient size and/or use of iterative reconstruction technique. COMPARISON:  Head CT May 2021 FINDINGS: CT HEAD FINDINGS Brain: No acute intracranial hemorrhage, mass effect, or edema. No acute appearing loss of gray-white differentiation. Chronic infarct of the left parietotemporal lobes. Prominence of the ventricles and sulci reflects parenchymal volume loss. Patchy low-density in the supratentorial white matter is nonspecific but may reflect similar mild chronic microvascular ischemic changes. Vascular: There is intracranial atherosclerotic calcification at the skull base. Skull: Unremarkable. Other: Mastoid air cells are clear. CT MAXILLOFACIAL FINDINGS Osseous: No acute facial fracture. Orbits: No intraorbital hematoma. Sinuses: Minor mucosal thickening. Soft tissues: Unremarkable. IMPRESSION: No evidence of acute intracranial injury. No acute facial fracture. Electronically Signed   By: Macy Mis M.D.   On: 02/07/2022 17:51    Procedures .Critical Care Performed by:  Sherwood Gambler, MD Authorized by: Sherwood Gambler, MD   Critical care provider statement:    Critical care time (minutes):  30   Critical care time was exclusive of:  Separately billable procedures and treating other patients   Critical care was necessary to treat or prevent imminent or life-threatening deterioration of the following conditions:  Shock   Critical care was time spent personally by me on the following activities:  Development of treatment plan with patient or surrogate, discussions with consultants, evaluation of patient's response to treatment, examination of patient, ordering and review of laboratory studies, ordering and review of radiographic studies, ordering and performing treatments and interventions, pulse oximetry, re-evaluation of patient's condition and review of old charts    Medications Ordered in ED Medications  sodium chloride 0.9 % bolus 1,000 mL (1,000 mLs Intravenous New Bag/Given 02/07/22 1650)  iohexol (OMNIPAQUE) 350 MG/ML injection 100 mL (100 mLs Intravenous Contrast Given 02/07/22 1716)    ED Course/ Medical Decision Making/ A&P Clinical Course as of 02/07/22 1849  Fri Feb 07, 2022  1656 Patient's BP has come up from the 80-90 range with less than 500 cc of his IV fluids.  BP is now normal.  He does seem to intermittently go into a bradycardic rhythm that I cannot see obvious P waves on on the cardiac monitor.  Heart rate is in the 40s.  However he is asymptomatic during these.  I tried to do a bedside ultrasound to evaluate for AAA but due to obesity and gas I am unable to see anything.  We will change his CT to a CT angio. [SG]    Clinical Course User Index [SG] Sherwood Gambler, MD                           Medical Decision Making Problems Addressed: Near syncope: acute illness or injury that poses a threat to life or bodily functions  Amount and/or Complexity of Data Reviewed Independent Historian: spouse External Data Reviewed: labs and  notes. Labs: ordered. Radiology: ordered and independent interpretation performed. ECG/medicine tests: ordered and independent interpretation performed.  Risk Prescription drug management. Decision regarding hospitalization.   Patient is hypotensive on arrival and was given IV fluid bolus as above.  This did help resolve his hypotension.  I think his lactic acid of 2.3 is from the hypotension and may be some dehydration but there is no obvious signs of an infection so I do not think this is sepsis.  Otherwise his labs have been reviewed/interpreted and he does not have  an anemia as he did in the past and he does not have any significant AKI.  No significant electrolyte disturbance.  CT scans reviewed/interpreted by myself and there is no head bleed, facial fracture, or obvious aortic dissection/aneurysm.  He has been having intermittent episodes of bradycardia on the cardiac monitor.  I personally viewed and interpreted these.  Seems like it is a sinus bradycardia.  I do not see any obvious AV blocks.  I discussed with Dr. Virgina Jock and we have discussed the case and the ECGs and he is viewed these.  Feels this might be vagal as he keeps having this intermittent abdominal "gas".  He has had constipation for a day and a half though he does not feel like he emergently needs to have a bowel movement right now.  Could be that these are vagal episodes.  However he will definitely need telemetry monitoring.  Cardiology will consult but no indication for emergent pacemaker.  No further hypotension despite the frequent bradycardia.  He is not on any rate control medications.  He will need admission and further monitoring on telemetry.  Discussed with hospitalist, Dr. Trilby Drummer, for admission        Final Clinical Impression(s) / ED Diagnoses Final diagnoses:  Near syncope  Sinus bradycardia    Rx / DC Orders ED Discharge Orders     None         Sherwood Gambler, MD 02/07/22 1935

## 2022-02-07 NOTE — Progress Notes (Incomplete)
Patient bradycardic to 39 heart rate and SpO2 60-70%.  He was in no distress but resting with eyes closed.  Told to waking and take deep breathes and came back up.  Patient states he snores but has never been diagnosed with sleep apnea.

## 2022-02-07 NOTE — ED Notes (Signed)
RN started 1L bolus NS.

## 2022-02-07 NOTE — H&P (Signed)
History and Physical   Colton Miranda WJX:[id number] DOB: 29-Jul-1941 DOA: 02/07/2022  PCP: Ginger Organ., MD   Patient coming from: Home  Chief Complaint: Syncope  HPI: Colton Miranda is a 81 y.o. male with medical history significant of protein C&S deficiency, on anticoagulation, DVT and PE, hyperlipidemia, hypertension, diabetes, atrial fibrillation, anemia, COPD, prostate cancer, CVA presenting after episode of syncope.  Patient was watching TV earlier and felt discomfort like a gas sensation and needed to go to the bathroom.  Prior to this he also reports a brief episode of vision narrowing.  He got up to go to the bathroom with his walker and he felt dizzy and lightheadedness and fell to the ground.  May have hit his head but denies loss of consciousness.  EMS was called and on their arrival heart rate was in the 30s and blood pressure was in the 21H systolic.  He received a small bolus with some improvement.  He is feeling better in the ED other than continued gas sensation.  He denies fevers, chills, chest pain, shortness of breath, abdominal pain, constipation, diarrhea, nausea, vomiting.  Denies any palpitations prior to event.  ED Course: Vital signs in the ED significant for blood pressure in the 086V to 784O systolic and intermittent bradycardia as well as some intermittent tachycardia.  Lab work included CMP with creatinine of 1.33 up from baseline around 1.1, BUN 26, glucose 165, albumin 3.4.  CBC with hemoglobin stable at 12.  PT and INR elevated at 33.5 and 3.3 respectively.  Lactic acid elevated at 2.6 with repeat pending lipase mildly elevated to 53.  Troponin normal with repeat pending.  Patient was typed and screened and urinalysis pending.  Chest x-ray showed low lung volumes without atelectasis.  CT head showed no acute abnormality, CT maxillofacial showed no acute abnormality.  CT of the chest abdomen pelvis with and without contrast showed no evidence of aneurysm  or dissection.  No acute abnormality.  Large stool burden was noted.  Patient received a liter of fluids in the ED.  Cardiology was consulted and recommended avoiding nodal blocking agents but no indication for pacemaker at this time, will see the patient tomorrow.  Review of Systems: As per HPI otherwise all other systems reviewed and are negative.  Past Medical History:  Diagnosis Date   BPH associated with nocturia    Chronic constipation    COPD (chronic obstructive pulmonary disease) (HCC)    Depression    Eczema    Heart murmur    at birth   History of adenomatous polyp of colon    History of basal cell carcinoma (BCC) of skin    post excision from trunk   History of CVA (cerebrovascular accident) without residual deficits    11-06-2017 per pt and wife "stroke was approx. 20 years ago , 1999, caused by blood clot after knee scope"  DVT to PE   History of depression    over 40 years ago nervous breakdown   History of DVT (deep vein thrombosis)    11-06-2017 per pt and wife happened lower leg approx. 1999 after knee scope   History of pulmonary embolus (PE)    11-06-2017 from DVT in approx. 1999 per wife and pt   HOH (hard of hearing)    Hyperlipidemia    Hypertension    followed by pcp   Long term (current) use of anticoagulants Coumadin-- followed by [facility] w/ [facility]   secondary to  primary hypercoagulopathy w/ hx DVT and PE   Myalgia and myositis, unspecified    OA (osteoarthritis)    "all joints"   PAF (paroxysmal atrial fibrillation) ([place]) 2019   Peripheral neuropathy    Pinched nerve in neck    per pt causes intermittant numbness bilateral arms and hands   Primary hypercoagulable state [facility])    Prostate cancer [facility]) urologist-  dr Alyson Ingles  oncologist-- dr Tammi Klippel   dx 08-28-2017  Stage T1c, Gleason 4+4, PSA 14.1, vol 42.8cc-- planned treatment external radiation and ADT   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[place])    Protein S deficiency ([place])    Right bundle  branch block    Seborrheic dermatitis, unspecified    Stroke ([place])    Type 2 diabetes mellitus ([place])    Urinary frequency    Wears glasses     Past Surgical History:  Procedure Laterality Date   [place]  last one 01-28-2017   GOLD SEED IMPLANT N/A 11/16/2017   Procedure: GOLD SEED IMPLANT;  Surgeon: Cleon Gustin, MD;  Location: [facility];  Service: Urology;  Laterality: N/A;   KNEE ARTHROSCOPY Right 1990s   PILONIDAL CYST EXCISION  1968   PROSTATE BIOPSY  09/10/2017    (done in office)   + Pos for Prostate Cancer, Dr.MCkinzie( [facility])    SPACE OAR INSTILLATION N/A 11/16/2017   Procedure: SPACE OAR INSTILLATION;  Surgeon: Cleon Gustin, MD;  Location: [facility];  Service: Urology;  Laterality: N/A;   TONSILLECTOMY     TOTAL HIP ARTHROPLASTY Right 06-18-2009   dr Wynelle Link  [place] Left 10/31/2021   Procedure: LEFT TOTAL HIP ARTHROPLASTY ANTERIOR APPROACH;  Surgeon: Mcarthur Rossetti, MD;  Location: [place];  Service: Orthopedics;  Laterality: Left;   TOTAL KNEE ARTHROPLASTY Left 02/10/2020   Procedure: TOTAL KNEE ARTHROPLASTY;  Surgeon: Sydnee Cabal, MD;  Location: [facility];  Service: Orthopedics;  Laterality: Left;  adductor canale    Social History  reports that he quit smoking about 13 years ago. His smoking use included cigarettes. He has a 25.00 pack-year smoking history. He has never used smokeless tobacco. He reports current alcohol use of about 3.0 standard drinks per week. He reports that he does not use drugs.  No Known Allergies  Family History  Problem Relation Age of Onset   Cancer Mother 31       colon , breast   Colon cancer Mother    Parkinson's disease Sister    Cancer Sister        breast   Cancer Maternal Uncle        melanoma   Cancer Paternal Uncle    Heart attack Daughter        2008  Reviewed on admission  Prior to Admission medications    Medication Sig Start Date End Date Taking? Authorizing Provider  HYDROcodone-acetaminophen (NORCO) 7.5-325 MG tablet Take 1 tablet by mouth every 6 (six) hours as needed for moderate pain. 11/11/21   Mcarthur Rossetti, MD  acetaminophen (TYLENOL) 325 MG tablet Take 325 mg by mouth every 6 (six) hours as needed for moderate pain.    [provider]  bethanechol (URECHOLINE) 10 MG tablet Take 1 tablet (10 mg total) by mouth 3 (three) times daily. 11/11/21   Cherene Altes, MD  Blood Glucose Monitoring Suppl (ONETOUCH VERIO) w/Device KIT by Does not apply route. Check blood sugar twice daily  as directed DX E11.65    [provider]  Cholecalciferol (VITAMIN D3) 50 MCG (2000 UT) TABS Take 2,000 Units by mouth daily.     [provider]  docusate sodium (COLACE) 100 MG capsule Take 100 mg by mouth as needed for constipation.    [provider]  fluconazole (DIFLUCAN) 100 MG tablet Take 1 tablet (100 mg total) by mouth daily. 12/26/21   Pete Pelt, PA-C  ketoconazole (NIZORAL) 2 % cream Apply 1 application topically daily.    [provider]  lisinopril (PRINIVIL,ZESTRIL) 20 MG tablet Take 1 tablet (20 mg total) by mouth daily. 03/30/18   Lauree Chandler, NP  metFORMIN (GLUCOPHAGE) 500 MG tablet TAKE 1 TABLET BY MOUTH ONCE DAILY WITH BREAKFAST Patient taking differently: Take 500 mg by mouth daily with breakfast. 01/25/18   Lauree Chandler, NP  NYSTATIN powder Apply 1 application topically 2 (two) times daily as needed for irritation. 06/04/21   [provider]  Jonetta Speak LANCETS 03T MISC USE 1  TO CHECK GLUCOSE TWICE DAILY Patient taking differently: 2 (two) times daily. 03/02/18   Lauree Chandler, NP  ONETOUCH VERIO test strip USE 1 STRIP TO CHECK GLUCOSE TWICE DAILY AS DIRECTED Patient taking differently: 2 (two) times daily. 03/02/18   Lauree Chandler, NP  polyethylene glycol (MIRALAX / GLYCOLAX) 17 g packet Take 17 g by  mouth 2 (two) times daily. 11/11/21   Cherene Altes, MD  senna-docusate (SENOKOT-S) 8.6-50 MG tablet Take 1 tablet by mouth 2 (two) times daily. 11/11/21   Cherene Altes, MD  simvastatin (ZOCOR) 20 MG tablet TAKE 1 TABLET BY MOUTH ONCE DAILY FOR CHOLESTEROL Patient taking differently: Take 20 mg by mouth daily. 03/02/18   Lauree Chandler, NP  tamsulosin (FLOMAX) 0.4 MG CAPS capsule Take 1 capsule (0.4 mg total) by mouth daily. 11/12/21   Cherene Altes, MD  traMADol (ULTRAM) 50 MG tablet Take one to two tablets by mouth every 6 hours as needed for pain. Do not take more than 6 tablets in 24 hours Patient taking differently: Take 50 mg by mouth at bedtime as needed for moderate pain. 02/17/20   Antonieta Pert, MD  warfarin (COUMADIN) 10 MG tablet TAKE 1 TABLET BY MOUTH ONCE DAILY ALONG  WITH  A  5  MG  TABLET  FOR  A  TOTAL  OF  15  MG  DAILY Patient taking differently: Take 10 mg by mouth daily. 04/14/18   Hendricks Limes, MD  warfarin (COUMADIN) 5 MG tablet Take 5-7.5 mg by mouth See admin instructions. 5 mg on Tuesday,Wednesday,Thursday,Friday,Saturday and Sunday along with 10 mg to equal 102m 7.5 mg on Monday along with 10 mg to equal 17.5 mg    [provider]    Physical Exam: Vitals:   02/07/22 1730 02/07/22 1800 02/07/22 1815 02/07/22 1830  BP: 127/61 (!) 106/57 (!) 142/71 130/67  Pulse: 78 70 78 74  Resp: (!) 21 20 (!) 23 18  Temp:      TempSrc:      SpO2: 96% 98% 96% 98%    Physical Exam Constitutional:      General: He is not in acute distress.    Appearance: Normal appearance.  HENT:     Head: Normocephalic and atraumatic.     Mouth/Throat:     Mouth: Mucous membranes are moist.     Pharynx: Oropharynx is clear.  Eyes:     Extraocular Movements: Extraocular movements  intact.     Pupils: Pupils are equal, round, and reactive to light.  Cardiovascular:     Rate and Rhythm: Normal rate and regular rhythm.     Pulses: Normal pulses.     Heart sounds:  Normal heart sounds.  Pulmonary:     Effort: Pulmonary effort is normal. No respiratory distress.     Breath sounds: Normal breath sounds.  Abdominal:     General: Bowel sounds are normal. There is no distension.     Palpations: Abdomen is soft.     Tenderness: There is no abdominal tenderness.  Musculoskeletal:        General: No swelling or deformity.  Skin:    General: Skin is warm and dry.  Neurological:     General: No focal deficit present.     Mental Status: Mental status is at baseline.   Labs on Admission: I have personally reviewed following labs and imaging studies  CBC: Recent Labs  Lab 02/07/22 1648 02/07/22 1659  WBC 9.1  --   NEUTROABS 6.6  --   HGB 12.0* 13.3  HCT 38.8* 39.0  MCV 88.0  --   PLT 289  --     Basic Metabolic Panel: Recent Labs  Lab 02/07/22 1648 02/07/22 1659  NA 137 137  K 3.7 3.7  CL 104 102  CO2 23  --   GLUCOSE 165* 162*  BUN 26* 26*  CREATININE 1.33* 1.20  CALCIUM 9.4  --   MG 2.0  --     GFR: CrCl cannot be calculated (Unknown ideal weight.).  Liver Function Tests: Recent Labs  Lab 02/07/22 1648  AST 27  ALT 27  ALKPHOS 81  BILITOT 0.3  PROT 6.8  ALBUMIN 3.4*    Urine analysis:    Component Value Date/Time   COLORURINE YELLOW 02/13/2020 2200   APPEARANCEUR CLEAR 02/13/2020 2200   LABSPEC 1.026 02/13/2020 2200   PHURINE 5.0 02/13/2020 2200   GLUCOSEU NEGATIVE 02/13/2020 2200   HGBUR NEGATIVE 02/13/2020 2200   BILIRUBINUR NEGATIVE 02/13/2020 2200   KETONESUR NEGATIVE 02/13/2020 2200   PROTEINUR NEGATIVE 02/13/2020 2200   UROBILINOGEN 0.2 06/07/2009 0815   NITRITE NEGATIVE 02/13/2020 2200   LEUKOCYTESUR NEGATIVE 02/13/2020 2200    Radiological Exams on Admission: CT Head Wo Contrast  Result Date: 02/07/2022 CLINICAL DATA:  Head trauma, minor (Age >= 65y); Facial trauma, blunt EXAM: CT HEAD WITHOUT CONTRAST CT MAXILLOFACIAL WITHOUT CONTRAST TECHNIQUE: Multidetector CT imaging of the head and  maxillofacial structures were performed using the standard protocol without intravenous contrast. Multiplanar CT image reconstructions of the maxillofacial structures were also generated. RADIATION DOSE REDUCTION: This exam was performed according to the departmental dose-optimization program which includes automated exposure control, adjustment of the mA and/or kV according to patient size and/or use of iterative reconstruction technique. COMPARISON:  Head CT May 2021 FINDINGS: CT HEAD FINDINGS Brain: No acute intracranial hemorrhage, mass effect, or edema. No acute appearing loss of gray-white differentiation. Chronic infarct of the left parietotemporal lobes. Prominence of the ventricles and sulci reflects parenchymal volume loss. Patchy low-density in the supratentorial white matter is nonspecific but may reflect similar mild chronic microvascular ischemic changes. Vascular: There is intracranial atherosclerotic calcification at the skull base. Skull: Unremarkable. Other: Mastoid air cells are clear. CT MAXILLOFACIAL FINDINGS Osseous: No acute facial fracture. Orbits: No intraorbital hematoma. Sinuses: Minor mucosal thickening. Soft tissues: Unremarkable. IMPRESSION: No evidence of acute intracranial injury. No acute facial fracture. Electronically Signed   By: Macy Mis  M.D.   On: 02/07/2022 17:51   DG Chest Portable 1 View  Result Date: 02/07/2022 CLINICAL DATA:  Syncope.  Fall, hypotension. EXAM: PORTABLE CHEST 1 VIEW COMPARISON:  Chest CTA earlier today, chest radiograph 11/09/2021 FINDINGS: Low lung volumes. Upper normal heart size accentuated by low volume technique. Subsegmental bibasilar opacities without confluent consolidation. Bronchovascular crowding. No pneumothorax or pleural effusion. Bilateral shoulder arthropathy. IMPRESSION: Low lung volumes with bibasilar atelectasis. Electronically Signed   By: Keith Rake M.D.   On: 02/07/2022 18:17   CT Angio Chest/Abd/Pel for Dissection W  and/or Wo Contrast  Result Date: 02/07/2022 CLINICAL DATA:  Acute aortic syndrome suspected. Fall. History of prostate cancer. EXAM: CT ANGIOGRAPHY CHEST, ABDOMEN AND PELVIS TECHNIQUE: Non-contrast CT of the chest was initially obtained. Multidetector CT imaging through the chest, abdomen and pelvis was performed using the standard protocol during bolus administration of intravenous contrast. Multiplanar reconstructed images and MIPs were obtained and reviewed to evaluate the vascular anatomy. RADIATION DOSE REDUCTION: This exam was performed according to the departmental dose-optimization program which includes automated exposure control, adjustment of the mA and/or kV according to patient size and/or use of iterative reconstruction technique. CONTRAST:  167m OMNIPAQUE IOHEXOL 350 MG/ML SOLN COMPARISON:  CT angiogram chest 11/09/2021. CT abdomen and pelvis 10/02/2017. FINDINGS: CTA CHEST FINDINGS Cardiovascular: Preferential opacification of the thoracic aorta. No evidence of thoracic aortic aneurysm or dissection. Normal heart size. No pericardial effusion. There are atherosclerotic calcifications of the aorta and coronary arteries. Mediastinum/Nodes: There is a 5 mm hypodense right thyroid nodule which is unchanged. There are no enlarged mediastinal or hilar lymph nodes. Esophagus is within normal limits. Lungs/Pleura: There is minimal dependent atelectasis bilaterally. The lungs are otherwise clear. There is no pleural effusion or pneumothorax. Musculoskeletal: Multilevel degenerative changes affect the spine. No acute fractures are seen. Review of the MIP images confirms the above findings. CTA ABDOMEN AND PELVIS FINDINGS VASCULAR Aorta: Normal caliber aorta without aneurysm, dissection, vasculitis or significant stenosis. There are severe atherosclerotic calcifications throughout the aorta. Celiac: Patent without evidence of aneurysm, dissection, vasculitis or significant stenosis. SMA: There is severe  atherosclerotic calcifications of the origin of the superior mesenteric artery causing mild focal stenosis. Otherwise patent. Renals: Both renal arteries are patent without evidence of aneurysm, dissection, vasculitis, fibromuscular dysplasia or significant stenosis. IMA: Patent without evidence of aneurysm, dissection, vasculitis or significant stenosis. Inflow: Patent without evidence of aneurysm, dissection, vasculitis or significant stenosis. There are atherosclerotic calcifications. Veins: No obvious venous abnormality within the limitations of this arterial phase study. Review of the MIP images confirms the above findings. NON-VASCULAR Hepatobiliary: No focal liver abnormality is seen. Status post cholecystectomy. No biliary dilatation. Pancreas: Unremarkable. No pancreatic ductal dilatation or surrounding inflammatory changes. Spleen: Normal in size without focal abnormality. Adrenals/Urinary Tract: There is a 5.5 cm cyst in the right kidney. There is no hydronephrosis or perinephric fluid. The adrenal glands and bladder are within normal limits. Stomach/Bowel: Stomach is within normal limits. Appendix appears normal. No evidence of bowel wall thickening, distention, or inflammatory changes. There is a large amount of stool throughout the entire colon. Lymphatic: No enlarged lymph nodes are seen. Reproductive: Prostate radiotherapy seeds are present. Other: No abdominal wall hernia or abnormality. No abdominopelvic ascites. Musculoskeletal: Bilateral hip arthroplasties are present. There is some scarring in the anterior thigh. Multilevel degenerative changes affect the spine. Review of the MIP images confirms the above findings. IMPRESSION: 1. No evidence for aortic aneurysm or dissection. 2. No acute localizing process in  the chest, abdomen or pelvis. 3. Large stool burden compatible with constipation. 4.  Aortic Atherosclerosis (ICD10-I70.0). Electronically Signed   By: Ronney Asters M.D.   On: 02/07/2022  17:58   CT Maxillofacial Wo Contrast  Result Date: 02/07/2022 CLINICAL DATA:  Head trauma, minor (Age >= 65y); Facial trauma, blunt EXAM: CT HEAD WITHOUT CONTRAST CT MAXILLOFACIAL WITHOUT CONTRAST TECHNIQUE: Multidetector CT imaging of the head and maxillofacial structures were performed using the standard protocol without intravenous contrast. Multiplanar CT image reconstructions of the maxillofacial structures were also generated. RADIATION DOSE REDUCTION: This exam was performed according to the departmental dose-optimization program which includes automated exposure control, adjustment of the mA and/or kV according to patient size and/or use of iterative reconstruction technique. COMPARISON:  Head CT May 2021 FINDINGS: CT HEAD FINDINGS Brain: No acute intracranial hemorrhage, mass effect, or edema. No acute appearing loss of gray-white differentiation. Chronic infarct of the left parietotemporal lobes. Prominence of the ventricles and sulci reflects parenchymal volume loss. Patchy low-density in the supratentorial white matter is nonspecific but may reflect similar mild chronic microvascular ischemic changes. Vascular: There is intracranial atherosclerotic calcification at the skull base. Skull: Unremarkable. Other: Mastoid air cells are clear. CT MAXILLOFACIAL FINDINGS Osseous: No acute facial fracture. Orbits: No intraorbital hematoma. Sinuses: Minor mucosal thickening. Soft tissues: Unremarkable. IMPRESSION: No evidence of acute intracranial injury. No acute facial fracture. Electronically Signed   By: Macy Mis M.D.   On: 02/07/2022 17:51    EKG: Independently reviewed.  Most recent EKG with sinus bradycardia at 40 bpm and right bundle branch block.   This was a slower rate from earlier in the ED when rate showed sinus arrhythmia at 60 bpm with right bundle branch block.  Assessment/Plan Active Problems:   Primary hypercoagulable state ([place])   Long term current use of anticoagulant  therapy   Diabetes ([place])   Hyperlipemia   Essential hypertension   Malignant neoplasm of prostate (HCC)   PAF (paroxysmal atrial fibrillation) (HCC)   Anemia   COPD (chronic obstructive pulmonary disease) (HCC)   Symptomatic bradycardia   Syncope/Near syncope and collapse ?Symptomatic bradycardia vs VagoVasal  > Patient presenting after syncopal episode at home, did not truly lose consciousness per report but did get suddenly lightheaded and dizzy and fell to the ground.  Heart rate noted to be in the 30s with blood pressure in the 70s by EMS on their arrival. > Improvement in symptoms, blood pressure with IV fluids.  Intermittent bradycardia continues in the ED to the 40s. > Cardiology consulted and do not see indication for pacemaker at this time, they will see the patient. > Likely etiology is a degree of dehydration given mild elevation of creatinine without evidence of true AKI with improvement of blood pressure with IV fluids combined with degree of symptomatic bradycardia. Possibly vagel response 2/2 Constipation. - Appreciate cardiology recommendations - Monitor on telemetry, will place in progressive unit for now in case pacing or pharmacologic intervention for bradycardia is required - Echocardiogram - Orthostatic vital signs - Trend elevated lactic acid  Protein S/C deficiency > History of DVT and PE.  History of stroke secondary to clot around the time of knee surgery. - Continue home warfarin per pharmacy  Hyperlipidemia - Continue home simvastatin  Hypertension - Holding home antihypertensives in the setting of low normal blood pressure today and in the ED  Diabetes - SSI  Atrial fibrillation > Currently with sinus bradycardia intermittently but no A-fib. - Remains on blood thinner as  above  Anemia > Hemoglobin stable at 12 - Continue to trend CBC  History of prostate cancer - Noted  Constipation > Stool burden on CT. Did have BM in ED. - Continue home  Laxatives and stool softeners  DVT prophylaxis: Warfarin Code Status:   Partial, No ACLS, okay with Respiratory arrest treatment/intubation  Family Communication:  Updated at bedside  Disposition Plan:   Patient is from:  Home  Anticipated DC to:  Home  Anticipated DC date:  1 to 2 days  Anticipated DC barriers: None  Consults called:  Cardiology, consulted by EDP, will see pt tomorrow. Admission status:  Observation, progressive  Severity of Illness: The appropriate patient status for this patient is OBSERVATION. Observation status is judged to be reasonable and necessary in order to provide the required intensity of service to ensure the patient's safety. The patient's presenting symptoms, physical exam findings, and initial radiographic and laboratory data in the context of their medical condition is felt to place them at decreased risk for further clinical deterioration. Furthermore, it is anticipated that the patient will be medically stable for discharge from the hospital within 2 midnights of admission.    Marcelyn Bruins MD [facility]  How to contact the [facility] Attending or Consulting provider [place] or covering provider during after hours [place], for this patient?   Check the care team in [facility] and look for a) attending/consulting [facility] provider listed and b) the [facility] team listed Log into [email or web address] and use [place]'s universal password to access. If you do not have the password, please contact the hospital operator. Locate the [facility] provider you are looking for under [facility] and page to a number that you can be directly reached. If you still have difficulty reaching the provider, please page the [facility] (Director on Call) for the Hospitalists listed on amion for assistance.  02/07/2022, 6:54 PM

## 2022-02-07 NOTE — ED Notes (Signed)
Pt had large bowel movement on bedpan, pt cleaned, new pads placed under pt.

## 2022-02-08 ENCOUNTER — Other Ambulatory Visit: Payer: Self-pay | Admitting: Cardiology

## 2022-02-08 ENCOUNTER — Observation Stay (HOSPITAL_BASED_OUTPATIENT_CLINIC_OR_DEPARTMENT_OTHER): Payer: PPO

## 2022-02-08 DIAGNOSIS — R55 Syncope and collapse: Secondary | ICD-10-CM | POA: Diagnosis not present

## 2022-02-08 DIAGNOSIS — L8990 Pressure ulcer of unspecified site, unspecified stage: Secondary | ICD-10-CM | POA: Insufficient documentation

## 2022-02-08 DIAGNOSIS — R001 Bradycardia, unspecified: Secondary | ICD-10-CM | POA: Diagnosis not present

## 2022-02-08 DIAGNOSIS — I4510 Unspecified right bundle-branch block: Secondary | ICD-10-CM | POA: Diagnosis not present

## 2022-02-08 LAB — COMPREHENSIVE METABOLIC PANEL
ALT: 27 U/L (ref 0–44)
AST: 24 U/L (ref 15–41)
Albumin: 3.3 g/dL — ABNORMAL LOW (ref 3.5–5.0)
Alkaline Phosphatase: 81 U/L (ref 38–126)
Anion gap: 7 (ref 5–15)
BUN: 26 mg/dL — ABNORMAL HIGH (ref 8–23)
CO2: 22 mmol/L (ref 22–32)
Calcium: 9 mg/dL (ref 8.9–10.3)
Chloride: 108 mmol/L (ref 98–111)
Creatinine, Ser: 1.05 mg/dL (ref 0.61–1.24)
GFR, Estimated: >60 mL/min (ref >60)
Glucose, Bld: 128 mg/dL — ABNORMAL HIGH (ref 70–99)
Potassium: 4.7 mmol/L (ref 3.5–5.1)
Sodium: 137 mmol/L (ref 135–145)
Total Bilirubin: 0.4 mg/dL (ref 0.3–1.2)
Total Protein: 6.5 g/dL (ref 6.5–8.1)

## 2022-02-08 LAB — CBC
HCT: 37.1 % — ABNORMAL LOW (ref 39.0–52.0)
Hemoglobin: 11.8 g/dL — ABNORMAL LOW (ref 13.0–17.0)
MCH: 27.5 pg (ref 26.0–34.0)
MCHC: 31.8 g/dL (ref 30.0–36.0)
MCV: 86.5 fL (ref 80.0–100.0)
Platelets: 258 10*3/uL (ref 150–400)
RBC: 4.29 MIL/uL (ref 4.22–5.81)
RDW: 14.4 % (ref 11.5–15.5)
WBC: 12.2 10*3/uL — ABNORMAL HIGH (ref 4.0–10.5)
nRBC: 0 % (ref 0.0–0.2)

## 2022-02-08 LAB — ECHOCARDIOGRAM COMPLETE
AR max vel: 2.48 cm2
AV Peak grad: 14.7 mmHg
Ao pk vel: 1.92 m/s
Area-P 1/2: 3 cm2
Height: 67 in
S' Lateral: 3.1 cm
Weight: 3668.45 oz

## 2022-02-08 LAB — PROTIME-INR
INR: 3.7 — ABNORMAL HIGH (ref 0.8–1.2)
Prothrombin Time: 36.6 seconds — ABNORMAL HIGH (ref 11.4–15.2)

## 2022-02-08 LAB — GLUCOSE, CAPILLARY
Glucose-Capillary: 79 mg/dL (ref 70–99)
Glucose-Capillary: 121 mg/dL — ABNORMAL HIGH (ref 70–99)

## 2022-02-08 MED ORDER — DOXYCYCLINE HYCLATE 100 MG PO TABS
100.0000 mg | ORAL_TABLET | Freq: Two times a day (BID) | ORAL | Status: DC
  Filled 2022-02-08: qty 1

## 2022-02-08 MED ORDER — DOXYCYCLINE HYCLATE 100 MG PO TABS: 100.0000 mg | ORAL_TABLET | Freq: Two times a day (BID) | ORAL | 0 refills | Status: AC

## 2022-02-08 NOTE — Progress Notes (Signed)
Patient unable to ambulate even with assistance upon admission and this morning for orthostatic vitals due to weakness.  Will have next shift try later.

## 2022-02-08 NOTE — Discharge Summary (Addendum)
Physician Discharge Summary   Patient: Colton Miranda MRN: [id number] DOB: [birth year]  Admit date:     02/07/2022  Discharge date: 02/08/22  Discharge Physician: Berle Mull  PCP: Ginger Organ., MD  Recommendations at discharge: Follow-up with PCP in 1 week.  Discharge Diagnoses: Principal Problem:   Syncope and collapse Active Problems:   Primary hypercoagulable state ([place])   Long term current use of anticoagulant therapy   Diabetes ([place])   Hyperlipemia   Essential hypertension   Malignant neoplasm of prostate (HCC)   PAF (paroxysmal atrial fibrillation) (HCC)   Anemia   COPD (chronic obstructive pulmonary disease) (HCC)   Symptomatic bradycardia   Pressure injury of skin  Hospital Course: Presented with a nearly passing out event at home.  Work-up in the ER was unremarkable.  Cardiology was consulted.  Symptoms resolved.  Work-up was unremarkable.  Assessment and Plan: Syncope/Near syncope and collapse VagoVasal syncope in the setting of abdominal pain due to constipation Did not pass out but presented with dizziness and lightheadedness episode. Had bradycardia with hypotension. Cardiology was consulted. EKG was not showing any significant block. Monitor on telemetry without any evidence of acute abnormality. Echocardiogram showed preserved EF. Symptoms did not reoccurred on ambulation. Constipation and abdominal pain resolved. At present patient safe to be discharged home. Driving precautions instruction provided. PT recommends no therapy.  Bilateral lower extremity cellulitis. We will treat with short 5-day course of doxycycline.  Constipation Severe stool burden on CT. Currently resolved Continue aggressive regimen on discharge.   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 History of stroke secondary to clot around the time of knee surgery. Continue home warfarin per pharmacy  Hyperlipidemia Continue home simvastatin  Hypertension Holding  home antihypertensives in the setting of low normal blood pressure today and in the ED  Type 2 diabetes mellitus, controlled without any complication without insulin. Follow-up with PCP.  Continue metformin.  Chronic atrial fibrillation Currently with sinus bradycardia intermittently but no A-fib. Avoid AV nodal agents.  Normocytic anemia Chronic in nature. No bleeding. Hemoglobin stable.  Monitor. Outpatient follow-up with PCP recommended.  History of prostate cancer - Noted  Morbid obesity Body mass index is 35.91 kg/m.  Placing the pt at higher risk of poor outcomes.  Consultants: Cardiology  Procedures performed:  Echocardiogram  DISCHARGE MEDICATION: Allergies as of 02/08/2022   No Known Allergies      Medication List     STOP taking these medications    bethanechol 10 MG tablet Commonly known as: URECHOLINE   docusate sodium 100 MG capsule Commonly known as: COLACE   lisinopril 20 MG tablet Commonly known as: ZESTRIL       TAKE these medications    doxycycline 100 MG tablet Commonly known as: VIBRA-TABS Take 1 tablet (100 mg total) by mouth 2 (two) times daily for 5 days.   ketoconazole 2 % cream Commonly known as: NIZORAL Apply 1 application topically daily.   metFORMIN 500 MG tablet Commonly known as: GLUCOPHAGE TAKE 1 TABLET BY MOUTH ONCE DAILY WITH BREAKFAST   nystatin powder Generic drug: nystatin Apply 1 application topically 2 (two) times daily as needed for irritation.   OneTouch Delica Lancets 33L Misc USE 1  TO CHECK GLUCOSE TWICE DAILY What changed: See the new instructions.   OneTouch Verio test strip Generic drug: glucose blood USE 1 STRIP TO CHECK GLUCOSE TWICE DAILY AS DIRECTED What changed: See the new instructions.   OneTouch Verio w/Device Kit by Does not  apply route. Check blood sugar twice daily as directed DX E11.65   polyethylene glycol 17 g packet Commonly known as: MIRALAX / GLYCOLAX Take 17 g by mouth 2  (two) times daily. What changed:  when to take this reasons to take this   senna-docusate 8.6-50 MG tablet Commonly known as: Senokot-S Take 1 tablet by mouth 2 (two) times daily.   simvastatin 20 MG tablet Commonly known as: ZOCOR TAKE 1 TABLET BY MOUTH ONCE DAILY FOR CHOLESTEROL What changed: additional instructions   tamsulosin 0.4 MG Caps capsule Commonly known as: FLOMAX Take 1 capsule (0.4 mg total) by mouth daily.   traMADol 50 MG tablet Commonly known as: ULTRAM Take one to two tablets by mouth every 6 hours as needed for pain. Do not take more than 6 tablets in 24 hours What changed:  how much to take how to take this when to take this reasons to take this additional instructions   Vitamin D3 50 MCG (2000 UT) Tabs Take 2,000 Units by mouth daily.   warfarin 5 MG tablet Commonly known as: COUMADIN Take as directed. If you are unsure how to take this medication, talk to your nurse or doctor. Original instructions: Take 15-17.5 mg by mouth See admin instructions.  Take 5 mg daily along with the 10 mg tablet to make total of 15 mg daily.. Then take  17.5 mg only on Wednesday per wife What changed: Another medication with the same name was changed. Make sure you understand how and when to take each.   warfarin 10 MG tablet Commonly known as: COUMADIN Take as directed. If you are unsure how to take this medication, talk to your nurse or doctor. Original instructions: TAKE 1 TABLET BY MOUTH ONCE DAILY ALONG  WITH  A  5  MG  TABLET  FOR  A  TOTAL  OF  15  MG  DAILY What changed:  how much to take how to take this when to take this additional instructions        Follow-up Information     Ginger Organ., MD. Schedule an appointment as soon as possible for a visit in 1 week(s).   Specialty: Internal Medicine Contact information: [address] [place] [address] [phone]                Disposition: Home Diet recommendation: Cardiac  diet  Discharge Exam: Vitals:   02/08/22 0510 02/08/22 0753 02/08/22 1118 02/08/22 1513  BP:  (!) 129/59 (!) 146/63 (!) 144/66  Pulse:  (!) 52 (!) 50 63  Resp: _0 Temp:  97.9 F (36.6 C) 98 F (36.7 C) 98.3 F (36.8 C)  TempSrc:  Oral Oral Oral  SpO2: 98% 98% 95% 98%  Weight:      Height:       General: Appear in mild distress; no visible Abnormal Neck Mass Or lumps, Conjunctiva normal Cardiovascular: S1 and S2 Present, no Murmur, Respiratory: good respiratory effort, Bilateral Air entry present and CTA, no Crackles, no wheezes Abdomen: Bowel Sound present, Non tender  Extremities: bilateral trace Pedal edema with some redness and warmth in the calf area bilaterally without any calf tenderness Neurology: alert and oriented to time, place, and person Gait not checked due to patient safety concerns Filed Weights   02/07/22 2258 02/08/22 0408  Weight: 104 kg 104 kg   Condition at discharge: stable  The results of significant diagnostics from this hospitalization (including imaging, microbiology, ancillary and laboratory)  are listed below for reference.   Imaging Studies: CT Head Wo Contrast  Result Date: 02/07/2022 CLINICAL DATA:  Head trauma, minor (Age >= 65y); Facial trauma, blunt EXAM: CT HEAD WITHOUT CONTRAST CT MAXILLOFACIAL WITHOUT CONTRAST TECHNIQUE: Multidetector CT imaging of the head and maxillofacial structures were performed using the standard protocol without intravenous contrast. Multiplanar CT image reconstructions of the maxillofacial structures were also generated. RADIATION DOSE REDUCTION: This exam was performed according to the departmental dose-optimization program which includes automated exposure control, adjustment of the mA and/or kV according to patient size and/or use of iterative reconstruction technique. COMPARISON:  Head CT May 2021 FINDINGS: CT HEAD FINDINGS Brain: No acute intracranial hemorrhage, mass effect, or edema. No acute appearing  loss of gray-white differentiation. Chronic infarct of the left parietotemporal lobes. Prominence of the ventricles and sulci reflects parenchymal volume loss. Patchy low-density in the supratentorial white matter is nonspecific but may reflect similar mild chronic microvascular ischemic changes. Vascular: There is intracranial atherosclerotic calcification at the skull base. Skull: Unremarkable. Other: Mastoid air cells are clear. CT MAXILLOFACIAL FINDINGS Osseous: No acute facial fracture. Orbits: No intraorbital hematoma. Sinuses: Minor mucosal thickening. Soft tissues: Unremarkable. IMPRESSION: No evidence of acute intracranial injury. No acute facial fracture. Electronically Signed   By: Macy Mis M.D.   On: 02/07/2022 17:51   DG Chest Portable 1 View  Result Date: 02/07/2022 CLINICAL DATA:  Syncope.  Fall, hypotension. EXAM: PORTABLE CHEST 1 VIEW COMPARISON:  Chest CTA earlier today, chest radiograph 11/09/2021 FINDINGS: Low lung volumes. Upper normal heart size accentuated by low volume technique. Subsegmental bibasilar opacities without confluent consolidation. Bronchovascular crowding. No pneumothorax or pleural effusion. Bilateral shoulder arthropathy. IMPRESSION: Low lung volumes with bibasilar atelectasis. Electronically Signed   By: Keith Rake M.D.   On: 02/07/2022 18:17   ECHOCARDIOGRAM COMPLETE  Result Date: 02/08/2022    ECHOCARDIOGRAM REPORT   Patient Name:   MARKEVIUS TROMBETTA Date of Exam: 02/08/2022 Medical Rec #:  [id number]          Height:       67.0 in Accession #:    [id number]         Weight:       229.3 lb Date of Birth:  06-06-1941           BSA:          2.143 m Patient Age:    28 years           BP:           146/63 mmHg Patient Gender: M                  HR:           55 bpm. Exam Location:  Inpatient Procedure: 2D Echo, Cardiac Doppler and Color Doppler Indications:    Syncope  History:        Patient has no prior history of Echocardiogram examinations.                  COPD, Arrythmias:Atrial Fibrillation; Risk Factors:Diabetes and                 Hypertension.  Sonographer:    Jefferey Pica Referring Phys: [id number] [place]  1. Left ventricular ejection fraction, by estimation, is 60 to 65%. The left ventricle has normal function. The left ventricle has no regional wall motion abnormalities. There is mild left ventricular hypertrophy. Left ventricular diastolic parameters were normal.  2. Right  ventricular systolic function is normal. The right ventricular size is normal. There is normal pulmonary artery systolic pressure.  3. The mitral valve is normal in structure. Mild mitral valve regurgitation.  4. The aortic valve is tricuspid. Aortic valve regurgitation is not visualized. Aortic valve sclerosis/calcification is present, without any evidence of aortic stenosis. FINDINGS  Left Ventricle: Left ventricular ejection fraction, by estimation, is 60 to 65%. The left ventricle has normal function. The left ventricle has no regional wall motion abnormalities. The left ventricular internal cavity size was normal in size. There is  mild left ventricular hypertrophy. Left ventricular diastolic parameters were normal. Right Ventricle: The right ventricular size is normal. Right vetricular wall thickness was not assessed. Right ventricular systolic function is normal. There is normal pulmonary artery systolic pressure. The tricuspid regurgitant velocity is 2.56 m/s, and with an assumed right atrial pressure of 5 mmHg, the estimated right ventricular systolic pressure is 55.3 mmHg. Left Atrium: Left atrial size was normal in size. Right Atrium: Right atrial size was normal in size. Pericardium: There is no evidence of pericardial effusion. Mitral Valve: The mitral valve is normal in structure. Mild mitral annular calcification. Mild mitral valve regurgitation. Tricuspid Valve: The tricuspid valve is normal in structure. Tricuspid valve regurgitation is mild. Aortic  Valve: The aortic valve is tricuspid. Aortic valve regurgitation is not visualized. Aortic valve sclerosis/calcification is present, without any evidence of aortic stenosis. Aortic valve peak gradient measures 14.7 mmHg. Pulmonic Valve: The pulmonic valve was not well visualized. Pulmonic valve regurgitation is not visualized. No evidence of pulmonic stenosis. Aorta: The aortic root and ascending aorta are structurally normal, with no evidence of dilitation. IAS/Shunts: No atrial level shunt detected by color flow Doppler.  LEFT VENTRICLE PLAX 2D LVIDd:         4.60 cm   Diastology LVIDs:         3.10 cm   LV e' medial:    5.54 cm/s LV PW:         1.20 cm   LV E/e' medial:  10.3 LV IVS:        1.20 cm   LV e' lateral:   10.60 cm/s LVOT diam:     2.00 cm   LV E/e' lateral: 5.4 LV SV:         100 LV SV Index:   47 LVOT Area:     3.14 cm  RIGHT VENTRICLE             IVC RV S prime:     14.80 cm/s  IVC diam: 1.70 cm TAPSE (M-mode): 1.8 cm LEFT ATRIUM             Index        RIGHT ATRIUM           Index LA diam:        4.30 cm 2.01 cm/m   RA Area:     19.20 cm LA Vol (A2C):   78.8 ml 36.77 ml/m  RA Volume:   55.10 ml  25.71 ml/m LA Vol (A4C):   87.3 ml 40.73 ml/m LA Biplane Vol: 86.2 ml 40.22 ml/m  AORTIC VALVE                 PULMONIC VALVE AV Area (Vmax): 2.48 cm     PV Vmax:       1.04 m/s AV Vmax:        191.67 cm/s  PV Peak grad:  4.3 mmHg AV Peak  Grad:   14.7 mmHg LVOT Vmax:      151.00 cm/s LVOT Vmean:     87.300 cm/s LVOT VTI:       0.318 m  AORTA Ao Root diam: 3.30 cm Ao Asc diam:  3.30 cm MITRAL VALVE                TRICUSPID VALVE MV Area (PHT): 3.00 cm     TR Peak grad:   26.2 mmHg MV Decel Time: 253 msec     TR Vmax:        256.00 cm/s MV E velocity: 56.90 cm/s MV A velocity: 110.00 cm/s  SHUNTS MV E/A ratio:  0.52         Systemic VTI:  0.32 m                             Systemic Diam: 2.00 cm Dorris Carnes MD Electronically signed by Dorris Carnes MD Signature Date/Time: 02/08/2022/2:21:07 PM    Final     CT Angio Chest/Abd/Pel for Dissection W and/or Wo Contrast  Result Date: 02/07/2022 CLINICAL DATA:  Acute aortic syndrome suspected. Fall. History of prostate cancer. EXAM: CT ANGIOGRAPHY CHEST, ABDOMEN AND PELVIS TECHNIQUE: Non-contrast CT of the chest was initially obtained. Multidetector CT imaging through the chest, abdomen and pelvis was performed using the standard protocol during bolus administration of intravenous contrast. Multiplanar reconstructed images and MIPs were obtained and reviewed to evaluate the vascular anatomy. RADIATION DOSE REDUCTION: This exam was performed according to the departmental dose-optimization program which includes automated exposure control, adjustment of the mA and/or kV according to patient size and/or use of iterative reconstruction technique. CONTRAST:  130m OMNIPAQUE IOHEXOL 350 MG/ML SOLN COMPARISON:  CT angiogram chest 11/09/2021. CT abdomen and pelvis 10/02/2017. FINDINGS: CTA CHEST FINDINGS Cardiovascular: Preferential opacification of the thoracic aorta. No evidence of thoracic aortic aneurysm or dissection. Normal heart size. No pericardial effusion. There are atherosclerotic calcifications of the aorta and coronary arteries. Mediastinum/Nodes: There is a 5 mm hypodense right thyroid nodule which is unchanged. There are no enlarged mediastinal or hilar lymph nodes. Esophagus is within normal limits. Lungs/Pleura: There is minimal dependent atelectasis bilaterally. The lungs are otherwise clear. There is no pleural effusion or pneumothorax. Musculoskeletal: Multilevel degenerative changes affect the spine. No acute fractures are seen. Review of the MIP images confirms the above findings. CTA ABDOMEN AND PELVIS FINDINGS VASCULAR Aorta: Normal caliber aorta without aneurysm, dissection, vasculitis or significant stenosis. There are severe atherosclerotic calcifications throughout the aorta. Celiac: Patent without evidence of aneurysm, dissection, vasculitis or  significant stenosis. SMA: There is severe atherosclerotic calcifications of the origin of the superior mesenteric artery causing mild focal stenosis. Otherwise patent. Renals: Both renal arteries are patent without evidence of aneurysm, dissection, vasculitis, fibromuscular dysplasia or significant stenosis. IMA: Patent without evidence of aneurysm, dissection, vasculitis or significant stenosis. Inflow: Patent without evidence of aneurysm, dissection, vasculitis or significant stenosis. There are atherosclerotic calcifications. Veins: No obvious venous abnormality within the limitations of this arterial phase study. Review of the MIP images confirms the above findings. NON-VASCULAR Hepatobiliary: No focal liver abnormality is seen. Status post cholecystectomy. No biliary dilatation. Pancreas: Unremarkable. No pancreatic ductal dilatation or surrounding inflammatory changes. Spleen: Normal in size without focal abnormality. Adrenals/Urinary Tract: There is a 5.5 cm cyst in the right kidney. There is no hydronephrosis or perinephric fluid. The adrenal glands and bladder are within normal limits. Stomach/Bowel: Stomach is within normal  limits. Appendix appears normal. No evidence of bowel wall thickening, distention, or inflammatory changes. There is a large amount of stool throughout the entire colon. Lymphatic: No enlarged lymph nodes are seen. Reproductive: Prostate radiotherapy seeds are present. Other: No abdominal wall hernia or abnormality. No abdominopelvic ascites. Musculoskeletal: Bilateral hip arthroplasties are present. There is some scarring in the anterior thigh. Multilevel degenerative changes affect the spine. Review of the MIP images confirms the above findings. IMPRESSION: 1. No evidence for aortic aneurysm or dissection. 2. No acute localizing process in the chest, abdomen or pelvis. 3. Large stool burden compatible with constipation. 4.  Aortic Atherosclerosis (ICD10-I70.0). Electronically Signed    By: Ronney Asters M.D.   On: 02/07/2022 17:58   CT Maxillofacial Wo Contrast  Result Date: 02/07/2022 CLINICAL DATA:  Head trauma, minor (Age >= 65y); Facial trauma, blunt EXAM: CT HEAD WITHOUT CONTRAST CT MAXILLOFACIAL WITHOUT CONTRAST TECHNIQUE: Multidetector CT imaging of the head and maxillofacial structures were performed using the standard protocol without intravenous contrast. Multiplanar CT image reconstructions of the maxillofacial structures were also generated. RADIATION DOSE REDUCTION: This exam was performed according to the departmental dose-optimization program which includes automated exposure control, adjustment of the mA and/or kV according to patient size and/or use of iterative reconstruction technique. COMPARISON:  Head CT May 2021 FINDINGS: CT HEAD FINDINGS Brain: No acute intracranial hemorrhage, mass effect, or edema. No acute appearing loss of gray-white differentiation. Chronic infarct of the left parietotemporal lobes. Prominence of the ventricles and sulci reflects parenchymal volume loss. Patchy low-density in the supratentorial white matter is nonspecific but may reflect similar mild chronic microvascular ischemic changes. Vascular: There is intracranial atherosclerotic calcification at the skull base. Skull: Unremarkable. Other: Mastoid air cells are clear. CT MAXILLOFACIAL FINDINGS Osseous: No acute facial fracture. Orbits: No intraorbital hematoma. Sinuses: Minor mucosal thickening. Soft tissues: Unremarkable. IMPRESSION: No evidence of acute intracranial injury. No acute facial fracture. Electronically Signed   By: Macy Mis M.D.   On: 02/07/2022 17:51    Microbiology: Results for orders placed or performed during the hospital encounter of 02/07/22  MRSA Next Gen by PCR, Nasal     Status: None   Collection Time: 02/07/22  9:56 PM   Specimen: Nasal Mucosa; Nasal Swab  Result Value Ref Range Status   MRSA by PCR Next Gen NOT DETECTED NOT DETECTED Final    Comment:  (NOTE) The GeneXpert MRSA Assay (FDA approved for NASAL specimens only), is one component of a comprehensive MRSA colonization surveillance program. It is not intended to diagnose MRSA infection nor to guide or monitor treatment for MRSA infections. Test performance is not FDA approved in patients less than 72 years old. Performed at [facility], [place] [address], [place], [place] [address]     Labs: CBC: Recent Labs  Lab 02/07/22 1648 02/07/22 1659 02/08/22 0032  WBC 9.1  --  12.2*  NEUTROABS 6.6  --   --   HGB 12.0* 13.3 11.8*  HCT 38.8* 39.0 37.1*  MCV 88.0  --  86.5  PLT 289  --  782   Basic Metabolic Panel: Recent Labs  Lab 02/07/22 1648 02/07/22 1659 02/08/22 0032  NA 137 137 137  K 3.7 3.7 4.7  CL 104 102 108  CO2 23  --  22  GLUCOSE 165* 162* 128*  BUN 26* 26* 26*  CREATININE 1.33* 1.20 1.05  CALCIUM 9.4  --  9.0  MG 2.0  --   --    Liver  Function Tests: Recent Labs  Lab 02/07/22 1648 02/08/22 0032  AST 27 24  ALT 27 27  ALKPHOS 81 81  BILITOT 0.3 0.4  PROT 6.8 6.5  ALBUMIN 3.4* 3.3*   CBG: Recent Labs  Lab 02/07/22 2205 02/08/22 0623 02/08/22 1115  GLUCAP 224* 79 121*    Discharge time spent: greater than 30 minutes.  Signed: Berle Mull, MD [facility]

## 2022-02-08 NOTE — Evaluation (Signed)
Physical Therapy Evaluation Patient Details Name: Colton Miranda MRN: [id number] DOB: 05-23-1941 Today's Date: 02/08/2022  History of Present Illness  Pt is a 81 y.o. M who presents 02/07/2022 with episode of near syncope and fall. EKG and telemetry in the ER showed sinus bradycardia, RBBB, intermittent NSVT, PACs. Lactic acid was mildly elevated, which has since resolved. CT head negative for acute abnormality. 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  Clinical Impression  PTA, pt lives with his spouse, uses a cane for household ambulation and walker for outdoor ambulation and is independent with ADL's. Pt presents fairly close to his functional baseline. Ambulating 200 ft with a walker at a supervision level. HR 60-104 bpm, SpO2 97%; orthostatics negative upon assessment (see below). No follow up PT needs; encouraged continued activity progression at d/c.  Orthostatic Vital Signs: Supine: 136/66 Sitting: 145/65 (87) Standing: 152/76 (97)   Recommendations for follow up therapy are one component of a multi-disciplinary discharge planning process, led by the attending physician.  Recommendations may be updated based on patient status, additional functional criteria and insurance authorization.  Follow Up Recommendations No PT follow up    Assistance Recommended at Discharge PRN  Patient can return home with the following  A little help with walking and/or transfers;A little help with bathing/dressing/bathroom;Assistance with cooking/housework;Assist for transportation;Help with stairs or ramp for entrance    Equipment Recommendations None recommended by PT  Recommendations for Other Services       Functional Status Assessment Patient has had a recent decline in their functional status and demonstrates the ability to make significant improvements in function in a reasonable and  predictable amount of time.     Precautions / Restrictions Precautions Precautions: Fall Restrictions Weight Bearing Restrictions: No      Mobility  Bed Mobility Overal bed mobility: Needs Assistance Bed Mobility: Supine to Sit     Supine to sit: Min assist     General bed mobility comments: MinA to pull up to sitting position    Transfers Overall transfer level: Needs assistance Equipment used: Rolling walker (2 wheels) Transfers: Sit to/from Stand Sit to Stand: Supervision                Ambulation/Gait Ambulation/Gait assistance: Supervision Gait Distance (Feet): 200 Feet Assistive device: Rolling walker (2 wheels) Gait Pattern/deviations: Step-through pattern, Decreased stride length, Decreased dorsiflexion - right, Decreased dorsiflexion - left       General Gait Details: Decreased bilateral foot clearance, particularly with fatigue  Stairs            Wheelchair Mobility    Modified Rankin (Stroke Patients Only)       Balance Overall balance assessment: Mild deficits observed, not formally tested                                           Pertinent Vitals/Pain Pain Assessment Pain Assessment: Faces Faces Pain Scale: Hurts a little bit Pain Location: face from fall Pain Descriptors / Indicators: Discomfort Pain Intervention(s): Monitored during session    Home Living Family/patient expects to be discharged to:: Private residence Living Arrangements: Spouse/significant other Available Help at Discharge: Family;Available 24 hours/day Type of Home: Apartment Home Access: Stairs to enter       Home Layout: One level Home Equipment: Conservation officer, nature (2 wheels);BSC/3in1;Tub  bench;Grab bars - tub/shower;Hand held shower head;Adaptive equipment;Hospital bed;Cane - single point      Prior Function Prior Level of Function : Needs assist             Mobility Comments: cane for household distances, RW for  outdoors ADLs Comments: uses tub bench for transfers, performing all ADL's, pt cooking     Hand Dominance   Dominant Hand: Right    Extremity/Trunk Assessment   Upper Extremity Assessment Upper Extremity Assessment: Overall WFL for tasks assessed    Lower Extremity Assessment Lower Extremity Assessment: Generalized weakness       Communication   Communication: No difficulties  Cognition Arousal/Alertness: Awake/alert Behavior During Therapy: WFL for tasks assessed/performed Overall Cognitive Status: Within Functional Limits for tasks assessed                                          General Comments      Exercises     Assessment/Plan    PT Assessment Patient does not need any further PT services  PT Problem List         PT Treatment Interventions      PT Goals (Current goals can be found in the Care Plan section)  Acute Rehab PT Goals Patient Stated Goal: go home, increase activity PT Goal Formulation: All assessment and education complete, DC therapy    Frequency       Co-evaluation               AM-PAC PT "6 Clicks" Mobility  Outcome Measure Help needed turning from your back to your side while in a flat bed without using bedrails?: None Help needed moving from lying on your back to sitting on the side of a flat bed without using bedrails?: A Little Help needed moving to and from a bed to a chair (including a wheelchair)?: A Little Help needed standing up from a chair using your arms (e.g., wheelchair or bedside chair)?: A Little Help needed to walk in hospital room?: A Little Help needed climbing 3-5 steps with a railing? : A Little 6 Click Score: 19    End of Session Equipment Utilized During Treatment: Gait belt Activity Tolerance: Patient tolerated treatment well Patient left: in chair;with call bell/phone within reach;with chair alarm set Nurse Communication: Mobility status PT Visit Diagnosis: Unsteadiness on feet  (R26.81);Muscle weakness (generalized) (M62.81);Difficulty in walking, not elsewhere classified (R26.2)    Time: 1583-0940 PT Time Calculation (min) (ACUTE ONLY): 30 min   Charges:   PT Evaluation $PT Eval Moderate Complexity: 1 Mod PT Treatments $Therapeutic Activity: 8-22 mins        Wyona Almas, PT, DPT Acute Rehabilitation Services Pager [phone] Office [phone]   Deno Etienne 02/08/2022, 1:47 PM

## 2022-02-08 NOTE — Consult Note (Signed)
CARDIOLOGY CONSULT NOTE  Patient ID: Colton Miranda MRN: [id number] DOB/AGE: Sep 29, 1940 81 y.o.  Admit date: 02/07/2022 Referring Physician: [facility] hospitalist Reason for Consultation:  Bradycardia, near syncope  HPI:   81 y.o. 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  Patient was home on 02/07/2022 with his wife. He felt some "gas paiN" in his lower abdomen while watching the TV. He got up to go to the bathroom and felt lightheaded. He went down on the floow, but did not lose consciousness. He denied chest pain, shortness of breath,palpitations. EMS were called, they noticed his heart rate to be low. He was then brought to [facility] ER.   EKG and telemetry in the ER showed sinus bradycardia, RBBB, intermittent NSVT, PACs. Lactic acid was mildly elevated, which has since resolved. CTA was unremarkable other than large amounts of stool.  Past Medical History:  Diagnosis Date   BPH associated with nocturia    Chronic constipation    COPD (chronic obstructive pulmonary disease) (HCC)    Depression    Eczema    Heart murmur    at birth   History of adenomatous polyp of colon    History of basal cell carcinoma (BCC) of skin    post excision from trunk   History of CVA (cerebrovascular accident) without residual deficits    11-06-2017 per pt and wife "stroke was approx. 20 years ago , 1999, caused by blood clot after knee scope"  DVT to PE   History of depression    over 40 years ago nervous breakdown   History of DVT (deep vein thrombosis)    11-06-2017 per pt and wife happened lower leg approx. 1999 after knee scope   History of pulmonary embolus (PE)    11-06-2017 from DVT in approx. 1999 per wife and pt   HOH (hard of hearing)    Hyperlipidemia    Hypertension    followed by pcp   Long term (current) use of  anticoagulants Coumadin-- followed by Pharmasist w/ [facility]   secondary to primary hypercoagulopathy w/ hx DVT and PE   Myalgia and myositis, unspecified    OA (osteoarthritis)    "all joints"   PAF (paroxysmal atrial fibrillation) ([place]) 2019   Peripheral neuropathy    Pinched nerve in neck    per pt causes intermittant numbness bilateral arms and hands   Primary hypercoagulable state ([place])    Prostate cancer [facility]) urologist-  dr Alyson Ingles  oncologist-- dr Tammi Klippel   dx 08-28-2017  Stage T1c, Gleason 4+4, PSA 14.1, vol 42.8cc-- planned treatment external radiation and ADT   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[place])    Protein S deficiency ([place])    Right bundle branch block    Seborrheic dermatitis, unspecified    Stroke ([place])    Type 2 diabetes mellitus ([place])    Urinary frequency    Wears glasses      Past Surgical History:  Procedure Laterality Date   Needles  last one 01-28-2017   GOLD SEED IMPLANT N/A 11/16/2017   Procedure: GOLD SEED IMPLANT;  Surgeon: Cleon Gustin, MD;  Location: [facility];  Service: Urology;  Laterality: N/A;   KNEE ARTHROSCOPY Right 1990s   PILONIDAL CYST EXCISION  1968   PROSTATE BIOPSY  09/10/2017    (done in  office)   + Pos for Prostate Cancer, Dr.MCkinzie( [facility])    SPACE OAR INSTILLATION N/A 11/16/2017   Procedure: SPACE OAR INSTILLATION;  Surgeon: Cleon Gustin, MD;  Location: [facility];  Service: Urology;  Laterality: N/A;   TONSILLECTOMY     TOTAL HIP ARTHROPLASTY Right 06-18-2009   dr Wynelle Link  [place] Left 10/31/2021   Procedure: LEFT TOTAL HIP ARTHROPLASTY ANTERIOR APPROACH;  Surgeon: Mcarthur Rossetti, MD;  Location: [facility];  Service: Orthopedics;  Laterality: Left;   TOTAL KNEE ARTHROPLASTY Left 02/10/2020   Procedure: TOTAL KNEE ARTHROPLASTY;  Surgeon: Sydnee Cabal, MD;  Location: [facility];  Service: Orthopedics;  Laterality:  Left;  adductor canale      Family History  Problem Relation Age of Onset   Cancer Mother 90       colon , breast   Colon cancer Mother    Parkinson's disease Sister    Cancer Sister        breast   Cancer Maternal Uncle        melanoma   Cancer Paternal Uncle    Heart attack Daughter        2008     Social History: Social History   Socioeconomic History   Marital status: Married    Spouse name: Melanie   Number of children: 1   Years of education: Not on file   Highest education level: Not on file  Occupational History   Not on file  Tobacco Use   Smoking status: Former    Packs/day: 0.50    Years: 50.00    Pack years: 25.00    Types: Cigarettes    Quit date: 01/03/2009    Years since quitting: 13.1   Smokeless tobacco: Never  Vaping Use   Vaping Use: Never used  Substance and Sexual Activity   Alcohol use: Yes    Alcohol/week: 3.0 standard drinks    Types: 3 Cans of beer per week    Comment: 1-2 beers a week   Drug use: No   Sexual activity: Not Currently    Partners: Female    Birth control/protection: None  Other Topics Concern   Not on file  Social History Narrative   Not on file   Social Determinants of Health   Financial Resource Strain: Not on file  Food Insecurity: Not on file  Transportation Needs: Not on file  Physical Activity: Not on file  Stress: Not on file  Social Connections: Not on file  Intimate Partner Violence: Not on file     Medications Prior to Admission  Medication Sig Dispense Refill Last Dose   Cholecalciferol (VITAMIN D3) 50 MCG (2000 UT) TABS Take 2,000 Units by mouth daily.    02/07/2022   docusate sodium (COLACE) 100 MG capsule Take 100 mg by mouth as needed for constipation.   02/06/2022   ketoconazole (NIZORAL) 2 % cream Apply 1 application topically daily.   02/06/2022   lisinopril (PRINIVIL,ZESTRIL) 20 MG tablet Take 1 tablet (20 mg total) by mouth daily. 90 tablet 1 02/07/2022   metFORMIN (GLUCOPHAGE) 500 MG tablet  TAKE 1 TABLET BY MOUTH ONCE DAILY WITH BREAKFAST (Patient taking differently: Take 500 mg by mouth daily with breakfast.) 90 tablet 1 02/07/2022   NYSTATIN powder Apply 1 application topically 2 (two) times daily as needed for irritation.   02/06/2022   polyethylene glycol (MIRALAX / GLYCOLAX) 17 g packet Take 17 g by mouth 2 (two)  times daily. (Patient taking differently: Take 17 g by mouth daily as needed for mild constipation.) 14 each 0 Past Week   simvastatin (ZOCOR) 20 MG tablet TAKE 1 TABLET BY MOUTH ONCE DAILY FOR CHOLESTEROL (Patient taking differently: Take 20 mg by mouth daily.) 90 tablet 1 02/07/2022   traMADol (ULTRAM) 50 MG tablet Take one to two tablets by mouth every 6 hours as needed for pain. Do not take more than 6 tablets in 24 hours (Patient taking differently: Take 50 mg by mouth at bedtime as needed for moderate pain.) 4 tablet 0 Past Week   warfarin (COUMADIN) 10 MG tablet TAKE 1 TABLET BY MOUTH ONCE DAILY ALONG  WITH  A  5  MG  TABLET  FOR  A  TOTAL  OF  15  MG  DAILY (Patient taking differently: Take 15-17.5 mg by mouth See admin instructions. Take along with the 5 mg tablet daily to make total of 15 mg by mouth daily except on Wednesday take 17.5 mg per wife) 90 tablet 1 02/06/2022 at 1830   warfarin (COUMADIN) 5 MG tablet Take 15-17.5 mg by mouth See admin instructions.  Take 5 mg daily along with the 10 mg tablet to make total of 15 mg daily.. Then take  17.5 mg only on Wednesday per wife   02/06/2022 at 1830   bethanechol (URECHOLINE) 10 MG tablet Take 1 tablet (10 mg total) by mouth 3 (three) times daily. (Patient not taking: Reported on 02/07/2022) 21 tablet 0 Not Taking   Blood Glucose Monitoring Suppl (ONETOUCH VERIO) w/Device KIT by Does not apply route. Check blood sugar twice daily as directed DX E11.65      ONETOUCH DELICA LANCETS 76K MISC USE 1  TO CHECK GLUCOSE TWICE DAILY (Patient taking differently: 2 (two) times daily.) 100 each 12    ONETOUCH VERIO test strip USE 1  STRIP TO CHECK GLUCOSE TWICE DAILY AS DIRECTED (Patient taking differently: 2 (two) times daily.) 100 each 11    senna-docusate (SENOKOT-S) 8.6-50 MG tablet Take 1 tablet by mouth 2 (two) times daily. (Patient not taking: Reported on 02/07/2022)   Not Taking   tamsulosin (FLOMAX) 0.4 MG CAPS capsule Take 1 capsule (0.4 mg total) by mouth daily. (Patient not taking: Reported on 02/07/2022) 30 capsule 0 Not Taking    Review of Systems  Cardiovascular:  Positive for near-syncope. Negative for chest pain, dyspnea on exertion, leg swelling, palpitations and syncope.  Gastrointestinal:  Positive for abdominal pain and constipation.  Neurological:  Positive for light-headedness.     Physical Exam: Physical Exam Vitals and nursing note reviewed.  Constitutional:      General: He is not in acute distress. Neck:     Vascular: No JVD.  Cardiovascular:     Rate and Rhythm: Normal rate and regular rhythm.     Heart sounds: Normal heart sounds. No murmur heard. Pulmonary:     Effort: Pulmonary effort is normal.     Breath sounds: Normal breath sounds. No wheezing or rales.  Musculoskeletal:     Right lower leg: No edema.     Left lower leg: No edema.       Imaging/tests reviewed and independently interpreted: Lab Results: CBC, BMP, lactic acid, trop HS  Cardiac Studies:  Telemetry 02/07/2022: No significant arrhythmia today NSVT 8 beat, PACs, rate variation seen on telemetry on 02/06/2022  EKG 02/07/2022: Sinus bradycardia 40 bpm RBBB  Echocardiogram 10/11/2020:  Normal LV systolic function with visual EF 55-60%. Left ventricle  cavity  is normal in size. Normal global wall motion. Moderate left ventricular  hypertrophy. Doppler evidence of grade II (pseudonormal) diastolic  dysfunction, elevated LAP.  Left atrial cavity is moderately dilated.  Aortic valve sclerosis without stenosis.  Mild (Grade I) aortic regurgitation.  Mild (Grade I) mitral regurgitation.  Mild tricuspid  regurgitation. No evidence of pulmonary hypertension.  No prior study for comparison.  Lexiscan Tetrofosmin Stress Test 10/12/2020: Nondiagnostic ECG stress. Moderate degree medium extent fixed perfusion defect located in the apical inferior wall, mid inferior wall and basal inferior wall consistent with diaphragmatic attenuation. Minimal lateral ischemia cannot be excluded. Gated SPECT imaging of the left ventricle was normal. All segments of left ventricle demonstrated normal wall motion and thickening. Stress LV EF is normal 58%.  No previous exam available for comparison. Low risk.     Assessment & Recommendations:   81 y.o. 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  Bradycardia, near syncope: No change in resting dominant rhythm, compared to baseline. Sinus rhythm with RBBB, intermittent NSVT, PACs, bradycardia in 40s. Bradycardia has since resolved. I reckon this was a vasovagal response triggered by constipation related abdominal pain. No indication for pacemaker. Will obtain outpatient echocardiogram and telemetry. Avoid dehydration, good bowel regimen to avoid constipation.   Okay to discharge from cardiac standpoint.   Discussed interpretation of tests and management recommendations with the primary team     Nigel Mormon, MD Pager: [phone] Office: [phone]

## 2022-02-08 NOTE — Progress Notes (Signed)
Per Pharmacy, RN called patient and let patient's wife know to hold Coumadin dose for today and tomorrow & resume home dose Monday.  MD aware.

## 2022-02-10 ENCOUNTER — Telehealth: Payer: Self-pay | Admitting: Cardiology

## 2022-02-10 DIAGNOSIS — I480 Paroxysmal atrial fibrillation: Secondary | ICD-10-CM

## 2022-02-10 DIAGNOSIS — R55 Syncope and collapse: Secondary | ICD-10-CM

## 2022-02-10 NOTE — Telephone Encounter (Signed)
ICD-10-CM   1. Syncope and collapse  R55 LONG TERM MONITOR (3-14 DAYS)    2. PAF (paroxysmal atrial fibrillation) (HCC)  I48.0 LONG TERM MONITOR (3-14 DAYS)     Orders Placed This Encounter  Procedures   LONG TERM MONITOR (3-14 DAYS)    Standing Status:   Future    Standing Expiration Date:   02/11/2023    Order Specific Question:   Where should this test be performed?    Answer:   [facility]    Order Specific Question:   Does the patient have an implanted cardiac device?    Answer:   No    Order Specific Question:   Prescribed days of wear    Answer:   45    Order Specific Question:   Type of enrollment    Answer:   Clinic Enrollment    Order Specific Question:   Release to patient    Answer:   Immediate

## 2022-02-10 NOTE — Telephone Encounter (Signed)
From patient.

## 2022-02-10 NOTE — Telephone Encounter (Signed)
Patient's wife says patient was discharged from hospital on 02/07/22. She says the provider there took patient off of his blood pressure medication. She doesn't seem to know why and wants to confirm this is okay. Please verify with patient.

## 2022-02-11 ENCOUNTER — Ambulatory Visit: Payer: PPO

## 2022-02-11 DIAGNOSIS — R55 Syncope and collapse: Secondary | ICD-10-CM

## 2022-02-11 NOTE — Telephone Encounter (Signed)
Pt will be getting monitor placed 02/14/2022, ECHO will be completed 02/11/2022 at 1:00pm. Pt has a follow up scheduled for 02/14/2022 as well.

## 2022-02-12 DIAGNOSIS — E785 Hyperlipidemia, unspecified: Secondary | ICD-10-CM | POA: Diagnosis not present

## 2022-02-12 DIAGNOSIS — Z86718 Personal history of other venous thrombosis and embolism: Secondary | ICD-10-CM | POA: Diagnosis not present

## 2022-02-12 DIAGNOSIS — I10 Essential (primary) hypertension: Secondary | ICD-10-CM | POA: Diagnosis not present

## 2022-02-12 DIAGNOSIS — Z7901 Long term (current) use of anticoagulants: Secondary | ICD-10-CM | POA: Diagnosis not present

## 2022-02-12 DIAGNOSIS — I480 Paroxysmal atrial fibrillation: Secondary | ICD-10-CM | POA: Diagnosis not present

## 2022-02-12 DIAGNOSIS — K5900 Constipation, unspecified: Secondary | ICD-10-CM | POA: Diagnosis not present

## 2022-02-12 DIAGNOSIS — R7989 Other specified abnormal findings of blood chemistry: Secondary | ICD-10-CM | POA: Diagnosis not present

## 2022-02-12 DIAGNOSIS — R55 Syncope and collapse: Secondary | ICD-10-CM | POA: Diagnosis not present

## 2022-02-12 DIAGNOSIS — E1169 Type 2 diabetes mellitus with other specified complication: Secondary | ICD-10-CM | POA: Diagnosis not present

## 2022-02-12 DIAGNOSIS — D72829 Elevated white blood cell count, unspecified: Secondary | ICD-10-CM | POA: Diagnosis not present

## 2022-02-12 DIAGNOSIS — R600 Localized edema: Secondary | ICD-10-CM | POA: Diagnosis not present

## 2022-02-12 DIAGNOSIS — D649 Anemia, unspecified: Secondary | ICD-10-CM | POA: Diagnosis not present

## 2022-02-14 ENCOUNTER — Inpatient Hospital Stay: Payer: PPO

## 2022-02-14 ENCOUNTER — Ambulatory Visit: Payer: PPO | Admitting: Cardiology

## 2022-02-14 DIAGNOSIS — R55 Syncope and collapse: Secondary | ICD-10-CM

## 2022-02-15 DIAGNOSIS — R55 Syncope and collapse: Secondary | ICD-10-CM | POA: Diagnosis not present

## 2022-02-18 NOTE — Progress Notes (Signed)
Called and spoke to pt, pt voiced understanding.

## 2022-02-25 DIAGNOSIS — D6869 Other thrombophilia: Secondary | ICD-10-CM | POA: Diagnosis not present

## 2022-02-25 DIAGNOSIS — E1169 Type 2 diabetes mellitus with other specified complication: Secondary | ICD-10-CM | POA: Diagnosis not present

## 2022-02-25 DIAGNOSIS — Z7901 Long term (current) use of anticoagulants: Secondary | ICD-10-CM | POA: Diagnosis not present

## 2022-02-25 DIAGNOSIS — Z86718 Personal history of other venous thrombosis and embolism: Secondary | ICD-10-CM | POA: Diagnosis not present

## 2022-02-25 DIAGNOSIS — I480 Paroxysmal atrial fibrillation: Secondary | ICD-10-CM | POA: Diagnosis not present

## 2022-03-04 ENCOUNTER — Telehealth: Payer: Self-pay | Admitting: *Deleted

## 2022-03-04 NOTE — Telephone Encounter (Signed)
Ortho bundle 90 day call and outcome survey completed.

## 2022-03-06 DIAGNOSIS — E1151 Type 2 diabetes mellitus with diabetic peripheral angiopathy without gangrene: Secondary | ICD-10-CM | POA: Diagnosis not present

## 2022-03-06 DIAGNOSIS — L84 Corns and callosities: Secondary | ICD-10-CM | POA: Diagnosis not present

## 2022-03-06 DIAGNOSIS — M79674 Pain in right toe(s): Secondary | ICD-10-CM | POA: Diagnosis not present

## 2022-03-06 DIAGNOSIS — L603 Nail dystrophy: Secondary | ICD-10-CM | POA: Diagnosis not present

## 2022-03-06 DIAGNOSIS — B351 Tinea unguium: Secondary | ICD-10-CM | POA: Diagnosis not present

## 2022-03-06 DIAGNOSIS — M79675 Pain in left toe(s): Secondary | ICD-10-CM | POA: Diagnosis not present

## 2022-03-07 DIAGNOSIS — L89152 Pressure ulcer of sacral region, stage 2: Secondary | ICD-10-CM | POA: Diagnosis not present

## 2022-03-07 DIAGNOSIS — Z7409 Other reduced mobility: Secondary | ICD-10-CM | POA: Diagnosis not present

## 2022-03-07 DIAGNOSIS — M1990 Unspecified osteoarthritis, unspecified site: Secondary | ICD-10-CM | POA: Diagnosis not present

## 2022-03-10 ENCOUNTER — Ambulatory Visit: Payer: PPO | Admitting: Cardiology

## 2022-03-10 ENCOUNTER — Encounter: Payer: Self-pay | Admitting: Cardiology

## 2022-03-10 VITALS — BP 135/65 | HR 60 | Temp 97.6°F | Resp 17 | Ht 67.0 in | Wt 231.2 lb

## 2022-03-10 DIAGNOSIS — I10 Essential (primary) hypertension: Secondary | ICD-10-CM

## 2022-03-10 DIAGNOSIS — G903 Multi-system degeneration of the autonomic nervous system: Secondary | ICD-10-CM | POA: Diagnosis not present

## 2022-03-10 DIAGNOSIS — I6523 Occlusion and stenosis of bilateral carotid arteries: Secondary | ICD-10-CM

## 2022-03-10 DIAGNOSIS — R55 Syncope and collapse: Secondary | ICD-10-CM

## 2022-03-10 NOTE — Progress Notes (Signed)
Primary Physician/Referring:  Ginger Organ., MD  Patient ID: Colton Miranda, male    DOB: 1941-02-08, 81 y.o.   MRN: [id number]  Chief Complaint  Patient presents with   Hospitalization Follow-up   syncope   HPI:    Colton Miranda  is a 81 y.o. 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   Patient was admitted to the hospital on 02/07/2022 and discharged the following day with syncope and collapse felt to be due to vasovagal episode from abdominal discomfort and constipation.  He had uneventful stay in the hospital and did not have any significant arrhythmias on telemetry but was recommended outpatient follow-up.  He has not had any recurrence of syncope and no change in chronic dyspnea.  He has limited physical activity.   No specific symptoms today.   Past Medical History:  Diagnosis Date   BPH associated with nocturia    Chronic constipation    COPD (chronic obstructive pulmonary disease) (HCC)    Depression    Eczema    Heart murmur    at birth   History of adenomatous polyp of colon    History of basal cell carcinoma (BCC) of skin    post excision from trunk   History of CVA (cerebrovascular accident) without residual deficits    11-06-2017 per pt and wife "stroke was approx. 20 years ago , 1999, caused by blood clot after knee scope"  DVT to PE   History of depression    over 40 years ago nervous breakdown   History of DVT (deep vein thrombosis)    11-06-2017 per pt and wife happened lower leg approx. 1999 after knee scope   History of pulmonary embolus (PE)    11-06-2017 from DVT in approx. 1999 per wife and pt   HOH (hard of hearing)    Hyperlipidemia    Hypertension    followed by pcp   Long term (current) use  of anticoagulants Coumadin-- followed by Pharmasist w/ [facility]   secondary to primary hypercoagulopathy w/ hx DVT and PE   Myalgia and myositis, unspecified    OA (osteoarthritis)    "all joints"   Peripheral neuropathy    Pinched nerve in neck    per pt causes intermittant numbness bilateral arms and hands   Primary hypercoagulable state ([place])    Prostate cancer [facility]) urologist-  dr Alyson Ingles  oncologist-- dr Tammi Klippel   dx 08-28-2017  Stage T1c, Gleason 4+4, PSA 14.1, vol 42.8cc-- planned treatment external radiation and ADT   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[place])    Protein S deficiency ([place])    Right bundle branch block    Seborrheic dermatitis, unspecified    Stroke ([place])    Type 2 diabetes mellitus ([place])    Urinary frequency    Wears glasses    Past Surgical History:  Procedure Laterality Date   [place]  last one 01-28-2017   GOLD SEED IMPLANT N/A 11/16/2017   Procedure: GOLD SEED IMPLANT;  Surgeon: Cleon Gustin, MD;  Location: [facility];  Service: Urology;  Laterality: N/A;   KNEE ARTHROSCOPY Right 1990s   PILONIDAL CYST EXCISION  1968   PROSTATE BIOPSY  09/10/2017    (done in office)   +  Pos for Prostate Cancer, Dr.MCkinzie( [facility])    SPACE OAR INSTILLATION N/A 11/16/2017   Procedure: SPACE OAR INSTILLATION;  Surgeon: Cleon Gustin, MD;  Location: [facility];  Service: Urology;  Laterality: N/A;   TONSILLECTOMY     TOTAL HIP ARTHROPLASTY Right 06-18-2009   dr Wynelle Link  [place] Left 10/31/2021   Procedure: LEFT TOTAL HIP ARTHROPLASTY ANTERIOR APPROACH;  Surgeon: Mcarthur Rossetti, MD;  Location: [facility];  Service: Orthopedics;  Laterality: Left;   TOTAL KNEE ARTHROPLASTY Left 02/10/2020   Procedure: TOTAL KNEE ARTHROPLASTY;  Surgeon: Sydnee Cabal, MD;  Location: [facility];  Service: Orthopedics;  Laterality: Left;  adductor canale   Family History  Problem  Relation Age of Onset   Cancer Mother 57       colon , breast   Colon cancer Mother    Parkinson's disease Sister    Cancer Sister        breast   Cancer Maternal Uncle        melanoma   Cancer Paternal Uncle    Heart attack Daughter        2008   Social History   Tobacco Use   Smoking status: Former    Packs/day: 0.50    Years: 50.00    Total pack years: 25.00    Types: Cigarettes    Quit date: 01/03/2009    Years since quitting: 13.1   Smokeless tobacco: Never  Substance Use Topics   Alcohol use: Yes    Alcohol/week: 3.0 standard drinks of alcohol    Types: 3 Cans of beer per week    Comment: 1-2 beers a week   Marital Status: Married   ROS  Review of Systems  Cardiovascular:  Positive for dyspnea on exertion. Negative for chest pain and leg swelling.  Musculoskeletal:  Positive for arthritis and back pain.  Gastrointestinal:  Negative for melena.   Objective  Blood pressure 135/65, pulse 60, temperature 97.6 F (36.4 C), temperature source Temporal, resp. rate 17, height 5' 7"  (1.702 m), weight 231 lb 3.2 oz (104.9 kg), SpO2 95 %.     03/10/2022   12:45 PM 02/08/2022    3:13 PM 02/08/2022   11:18 AM  Vitals with BMI  Height 5' 7"     Weight 231 lbs 3 oz    BMI 65.6    Systolic 812 751 700  Diastolic 65 66 63  Pulse 60 63 50    Orthostatic VS for the past 72 hrs (Last 3 readings):  Orthostatic BP Patient Position BP Location Cuff Size Orthostatic Pulse  03/10/22 1259 104/51 Standing Left Arm Large 71  03/10/22 1257 137/61 Sitting Left Arm Large 64  03/10/22 1256 170/68 Supine Left Arm Normal 64     Physical Exam Constitutional:      Comments: He is well built and moderately obese in no acute distress.  Neck:     Thyroid: No thyromegaly.     Vascular: No JVD.  Cardiovascular:     Rate and Rhythm: Normal rate and regular rhythm.     Pulses:          Carotid pulses are 2+ on the right side and 2+ on the left side.      Popliteal pulses are 1+ on the  right side and 1+ on the left side.       Dorsalis pedis pulses are 0 on the right side and 0 on the left side.  Posterior tibial pulses are 0 on the right side and 0 on the left side.     Heart sounds: Normal heart sounds. No murmur heard.    No gallop.  Pulmonary:     Effort: Pulmonary effort is normal.     Breath sounds: Normal breath sounds.  Abdominal:     General: Bowel sounds are normal.     Palpations: Abdomen is soft.     Comments: Obese  Musculoskeletal:     Cervical back: No edema.     Right lower leg: No edema.     Left lower leg: No edema.  Skin:    Comments: Toenails are thick  with onychomycosis.    Laboratory examination:   Recent Labs    11/11/21 0056 02/07/22 1648 02/07/22 1659 02/08/22 0032  NA 130* 137 137 137  K 4.2 3.7 3.7 4.7  CL 99 104 102 108  CO2 23 23  --  22  GLUCOSE 140* 165* 162* 128*  BUN 27* 26* 26* 26*  CREATININE 1.03 1.33* 1.20 1.05  CALCIUM 8.7* 9.4  --  9.0  GFRNONAA >60 54*  --  >60    CrCl cannot be calculated (Patient's most recent lab result is older than the maximum 21 days allowed.).     Latest Ref Rng & Units 02/08/2022   12:32 AM 02/07/2022    4:59 PM 02/07/2022    4:48 PM  CMP  Glucose 70 - 99 mg/dL 128  162  165   BUN 8 - 23 mg/dL 26  26  26    Creatinine 0.61 - 1.24 mg/dL 1.05  1.20  1.33   Sodium 135 - 145 mmol/L 137  137  137   Potassium 3.5 - 5.1 mmol/L 4.7  3.7  3.7   Chloride 98 - 111 mmol/L 108  102  104   CO2 22 - 32 mmol/L 22   23   Calcium 8.9 - 10.3 mg/dL 9.0   9.4   Total Protein 6.5 - 8.1 g/dL 6.5   6.8   Total Bilirubin 0.3 - 1.2 mg/dL 0.4   0.3   Alkaline Phos 38 - 126 U/L 81   81   AST 15 - 41 U/L 24   27   ALT 0 - 44 U/L 27   27       Latest Ref Rng & Units 02/08/2022   12:32 AM 02/07/2022    4:59 PM 02/07/2022    4:48 PM  CBC  WBC 4.0 - 10.5 K/uL 12.2   9.1   Hemoglobin 13.0 - 17.0 g/dL 11.8  13.3  12.0   Hematocrit 39.0 - 52.0 % 37.1  39.0  38.8   Platelets 150 - 400 K/uL 258   289      HEMOGLOBIN A1C Lab Results  Component Value Date   HGBA1C 6.5 (H) 10/22/2021   MPG 139.85 10/22/2021   TSH No results for input(s): "TSH" in the last 8760 hours.   External labs:  Cholesterol, total 137.000 m 12/09/2021 HDL 52.000 mg 12/09/2021 LDL 70.000 mg 12/09/2021 Triglycerides 76.000 mg 12/09/2021  Allergies  No Known Allergies   Final Medications at End of Visit     Current Outpatient Medications:    Blood Glucose Monitoring Suppl (ONETOUCH VERIO) w/Device KIT, by Does not apply route. Check blood sugar twice daily as directed DX E11.65, Disp: , Rfl:    Cholecalciferol (VITAMIN D3) 50 MCG (2000 UT) TABS, Take 2,000 Units by mouth daily. , Disp: , Rfl:  ketoconazole (NIZORAL) 2 % cream, Apply 1 application topically daily., Disp: , Rfl:    lisinopril (ZESTRIL) 20 MG tablet, Take 10 mg by mouth daily., Disp: , Rfl:    metFORMIN (GLUCOPHAGE) 500 MG tablet, TAKE 1 TABLET BY MOUTH ONCE DAILY WITH BREAKFAST (Patient taking differently: Take 500 mg by mouth daily with breakfast.), Disp: 90 tablet, Rfl: 1   NYSTATIN powder, Apply 1 application topically 2 (two) times daily as needed for irritation., Disp: , Rfl:    ONETOUCH DELICA LANCETS 61Y MISC, USE 1  TO CHECK GLUCOSE TWICE DAILY (Patient taking differently: 2 (two) times daily.), Disp: 100 each, Rfl: 12   ONETOUCH VERIO test strip, USE 1 STRIP TO CHECK GLUCOSE TWICE DAILY AS DIRECTED (Patient taking differently: 2 (two) times daily.), Disp: 100 each, Rfl: 11   polyethylene glycol (MIRALAX / GLYCOLAX) 17 g packet, Take 17 g by mouth 2 (two) times daily. (Patient taking differently: Take 17 g by mouth daily as needed for mild constipation.), Disp: 14 each, Rfl: 0   senna-docusate (SENOKOT-S) 8.6-50 MG tablet, Take 1 tablet by mouth 2 (two) times daily., Disp: , Rfl:    simvastatin (ZOCOR) 20 MG tablet, TAKE 1 TABLET BY MOUTH ONCE DAILY FOR CHOLESTEROL (Patient taking differently: Take 20 mg by mouth daily.), Disp: 90 tablet, Rfl:  1   traMADol (ULTRAM) 50 MG tablet, Take one to two tablets by mouth every 6 hours as needed for pain. Do not take more than 6 tablets in 24 hours (Patient taking differently: Take 50 mg by mouth at bedtime as needed for moderate pain.), Disp: 4 tablet, Rfl: 0   warfarin (COUMADIN) 10 MG tablet, TAKE 1 TABLET BY MOUTH ONCE DAILY ALONG  WITH  A  5  MG  TABLET  FOR  A  TOTAL  OF  15  MG  DAILY (Patient taking differently: Take 15-17.5 mg by mouth See admin instructions. Take along with the 5 mg tablet daily to make total of 15 mg by mouth daily except on Wednesday take 17.5 mg per wife), Disp: 90 tablet, Rfl: 1   warfarin (COUMADIN) 5 MG tablet, Take 15-17.5 mg by mouth See admin instructions.  Take 5 mg daily along with the 10 mg tablet to make total of 15 mg daily.. Then take  17.5 mg only on Wednesday per wife, Disp: , Rfl:     Radiology:   CT angiogram chest 02/13/2020: Cardiovascular: There is moderate to marked severity calcification of the aortic arch. Satisfactory opacification of the pulmonary arteries to the segmental level. Normal heart size. No pericardial effusion. Moderate severity coronary artery calcification is seen. No evidence of pulmonary embolism. Small hiatal hernia. Multilevel degenerative changes throughout the thoracic spine.  CT angiogram chest 02/07/2022: 1. No evidence for aortic aneurysm or dissection. 2. No acute localizing process in the chest, abdomen or pelvis. 3. Large stool burden compatible with constipation. 4.  Aortic Atherosclerosis (ICD10-I70.0).  Cardiac Studies:   Lexiscan Tetrofosmin Stress Test 10/12/2020: Nondiagnostic ECG stress. Moderate degree medium extent fixed perfusion defect located in the apical inferior wall, mid inferior wall and basal inferior wall consistent with diaphragmatic attenuation. Minimal lateral ischemia cannot be excluded. Gated SPECT imaging of the left ventricle was normal. All segments of left ventricle demonstrated normal  wall motion and thickening. Stress LV EF is normal 58%.  No previous exam available for comparison. Low risk.   Carotid artery duplex 10/12/2020 Stenosis in the right internal carotid artery (1-15%) due to heterogeneous plaque. Stenosis in  the right common carotid artery and bifurcation (<50%). Antegrade right vertebral artery flow.  Mild stenosis in the left proximal internal carotid artery (1-15%) due to heterogenous plaque. Mid to distal LICA not visualized (neck immobile). Stenosis in the left common carotid artery (<50%). Stenosis in the left external carotid artery (<50%). Left vertebral artery not well visualized; however, by doppler predominantly antegrade flow. No prior studies for comparison. Follow up in one year is appropriate if clinically indicated.  PCV ECHOCARDIOGRAM COMPLETE 02/11/2022  Normal LV systolic function with visual EF 55-60%. Left ventricle cavity is normal in size. Normal left ventricular wall thickness. Normal global wall motion. Normal diastolic filling pattern, normal LAP. No significant valvular heart disease. Compared to 01/220/2022 G2DD is now normal, Mild AR/MR/TR have improved, otherwise no significant change.   Zio Patch Extended out patient EKG monitoring 14 days starting 02/14/2022 :  Preliminary data suggest no significant heart block, no significant arrhythmias, no atrial fibrillation.  No symptoms reported.  EKG:  EKG 03/10/2022: Normal sinus rhythm with rate of 61 bpm, normal axis, right bundle branch block.  Low-voltage complexes.  No evidence of ischemia.  No significant change from 05/09/2021.  Assessment     ICD-10-CM   1. Syncope and collapse  R55 EKG 12-Lead    2. 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 Primary hypertension  I10     4. Atherosclerosis of both carotid arteries  I65.23      CHA2DS2-VASc Score is 5.  Yearly risk of stroke: 7.2% (A, HTN, DM, Vasc Dz).  Score of 1=0.6; 2=2.2; 3=3.2; 4=4.8; 5=7.2; 6=9.8;  7=>9.8) -(CHF; HTN; vasc disease DM,  Male = 1; Age <65 =0; 65-74 = 1,  >75 =2; stroke/embolism= 2).     No orders of the defined types were placed in this encounter.  Medications Discontinued During This Encounter  Medication Reason   tamsulosin (FLOMAX) 0.4 MG CAPS capsule     Orders Placed This Encounter  Procedures   EKG 12-Lead      Recommendations:    Colton Miranda  is a 81 y.o. 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   Patient was admitted to the hospital on 02/07/2022 and discharged the following day with syncope and collapse felt to be due to vasovagal episode from abdominal discomfort and constipation.  He had uneventful stay in the hospital and did not have any significant arrhythmias on telemetry but was recommended outpatient follow-up.   Patient had abdominal discomfort and severe constipation prior to the episode and he had premonitory symptoms again suggesting neurocardiogenic syncope.  However he is severely orthostatic today which I suspect is probably due to underlying diabetes mellitus and peripheral autonomic neuropathy.  As he is asymptomatic without any dizziness, I have for now reduce the dose of lisinopril from 20 mg to 10 mg, advised his wife to monitor his blood pressure both supine and standing at least once or twice a week for the next 4 weeks and bring me the recordings from home.  He is presently sleeping in a hospital bed and he already sleeps with his head propped up.  Advised him to continue the same, as he has supine hypertension and orthostatic hypotension.  With regard to syncope, I do not suspect carotid atherosclerosis is the  etiology.  His dyspnea is related to his obesity.  Blood pressure is well  controlled. We again discussed regarding weight loss.      Colton Prows, MD, [facility] 03/10/2022, 1:15 PM Office: [phone] Fax: [phone] Pager: [phone]

## 2022-03-16 ENCOUNTER — Encounter: Payer: Self-pay | Admitting: Gastroenterology

## 2022-03-26 DIAGNOSIS — R55 Syncope and collapse: Secondary | ICD-10-CM | POA: Diagnosis not present

## 2022-04-03 DIAGNOSIS — Z86718 Personal history of other venous thrombosis and embolism: Secondary | ICD-10-CM | POA: Diagnosis not present

## 2022-04-03 DIAGNOSIS — Z7901 Long term (current) use of anticoagulants: Secondary | ICD-10-CM | POA: Diagnosis not present

## 2022-04-03 DIAGNOSIS — E1169 Type 2 diabetes mellitus with other specified complication: Secondary | ICD-10-CM | POA: Diagnosis not present

## 2022-04-03 DIAGNOSIS — I480 Paroxysmal atrial fibrillation: Secondary | ICD-10-CM | POA: Diagnosis not present

## 2022-04-03 DIAGNOSIS — D6869 Other thrombophilia: Secondary | ICD-10-CM | POA: Diagnosis not present

## 2022-04-06 DIAGNOSIS — Z7409 Other reduced mobility: Secondary | ICD-10-CM | POA: Diagnosis not present

## 2022-04-06 DIAGNOSIS — L89152 Pressure ulcer of sacral region, stage 2: Secondary | ICD-10-CM | POA: Diagnosis not present

## 2022-04-06 DIAGNOSIS — M1990 Unspecified osteoarthritis, unspecified site: Secondary | ICD-10-CM | POA: Diagnosis not present

## 2022-04-09 ENCOUNTER — Ambulatory Visit: Payer: PPO | Admitting: Cardiology

## 2022-04-09 ENCOUNTER — Encounter: Payer: Self-pay | Admitting: Cardiology

## 2022-04-09 VITALS — BP 125/68 | HR 61 | Temp 98.7°F | Resp 16 | Ht 67.0 in | Wt 232.2 lb

## 2022-04-09 DIAGNOSIS — G903 Multi-system degeneration of the autonomic nervous system: Secondary | ICD-10-CM | POA: Diagnosis not present

## 2022-04-09 DIAGNOSIS — I10 Essential (primary) hypertension: Secondary | ICD-10-CM

## 2022-04-09 DIAGNOSIS — R55 Syncope and collapse: Secondary | ICD-10-CM

## 2022-04-09 IMAGING — CR DG CHEST 2V
2 series · 2 of 2 positions shown · non-contrast
Comparison: December 02, 2016.

CLINICAL DATA: Syncope.

EXAM:
CHEST - 2 VIEW

[w chest lat]
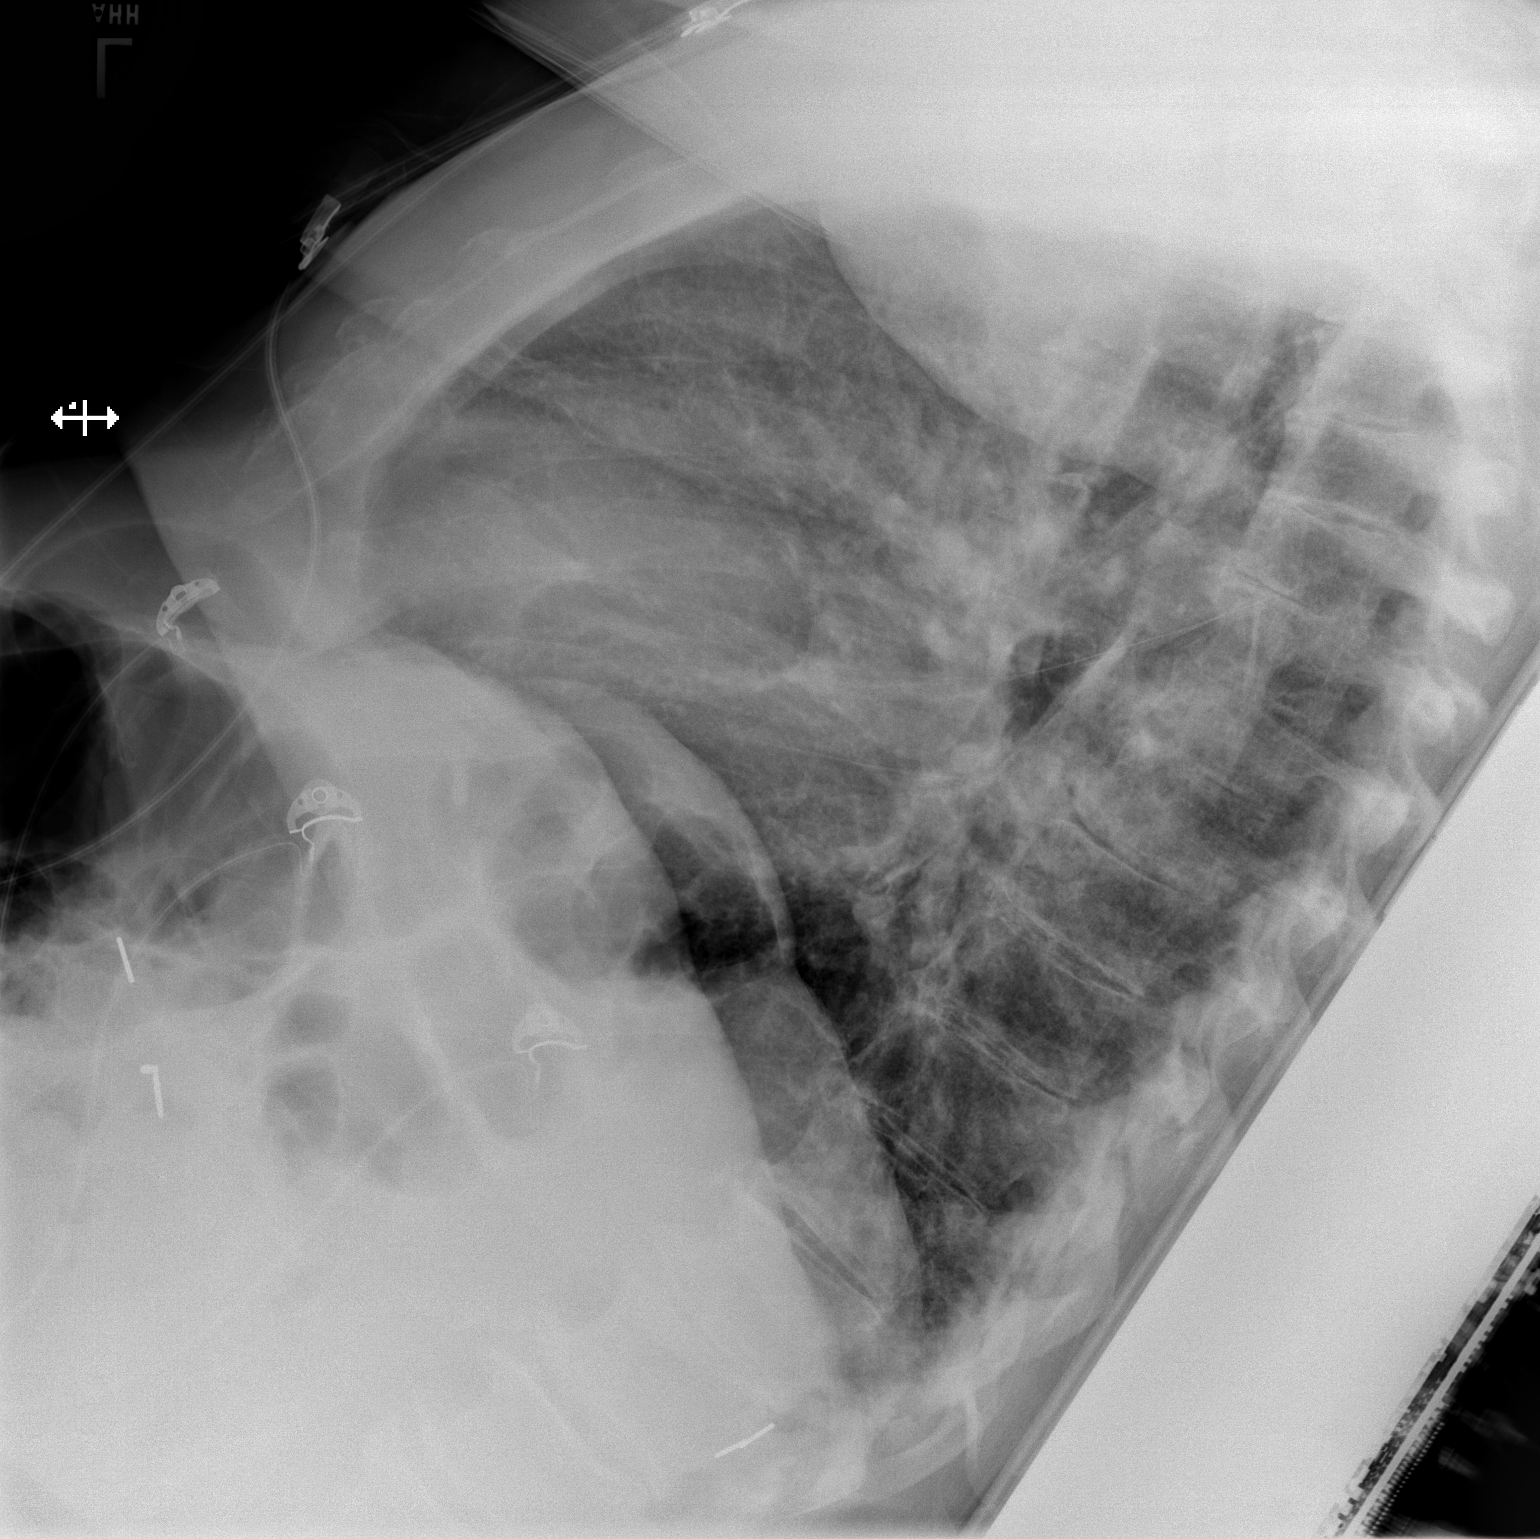

[x chest ap]
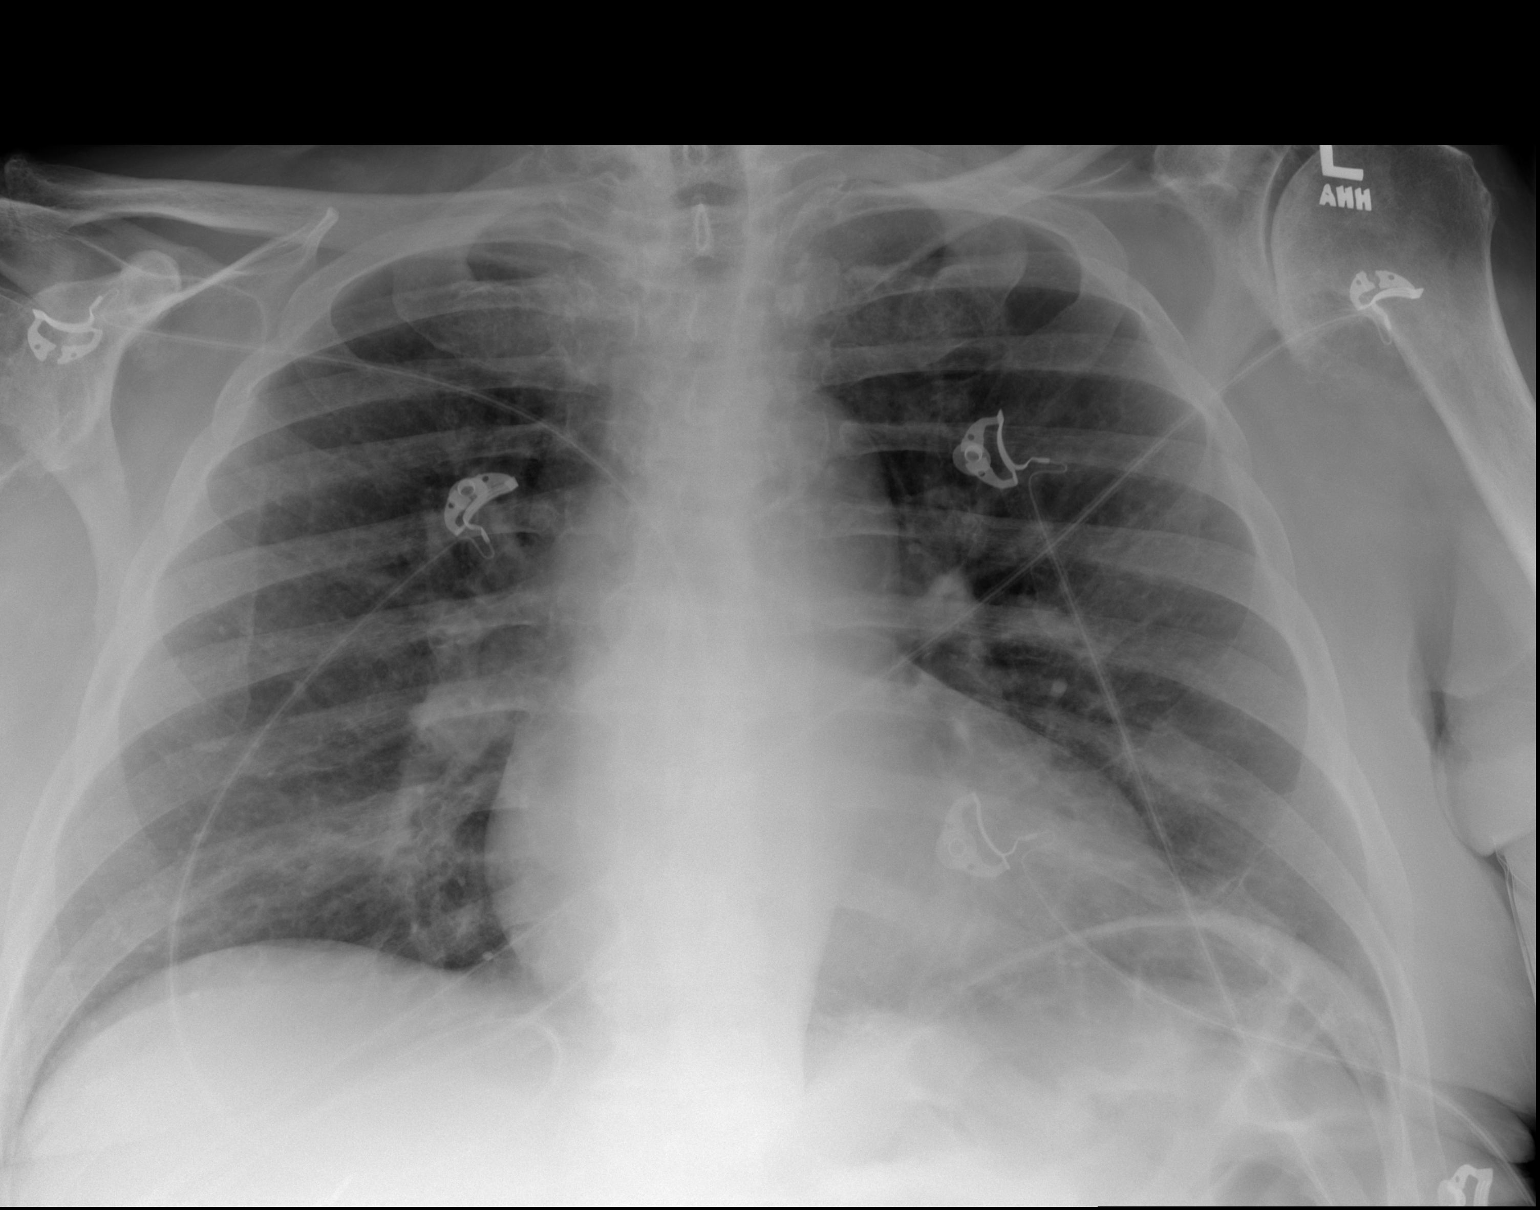

[2 of 2 positions shown; findings below may reference images not displayed]

FINDINGS: The heart size and mediastinal contours are within normal limits.
Both lungs are clear. No pneumothorax or pleural effusion is noted.
The visualized skeletal structures are unremarkable.
IMPRESSION: No active cardiopulmonary disease.

## 2022-04-09 IMAGING — CT CT ANGIO CHEST
2 of 6 series · 18 of 36 positions shown · IV contrast (omnipaque)
Comparison: None.

CLINICAL DATA: Syncopal episode.

EXAM:
CT ANGIOGRAPHY CHEST WITH CONTRAST
TECHNIQUE: Multidetector CT imaging of the chest was performed using the
standard protocol during bolus administration of intravenous
contrast. Multiplanar CT image reconstructions and MIPs were
obtained to evaluate the vascular anatomy.
CONTRAST:  100mL OMNIPAQUE IOHEXOL 350 MG/ML SOLN

[Series 7: thins · axial · 0.73mm/px · z∈[-768,-514]mm · 17 of 287 slices shown]
[im 16/287  lung]
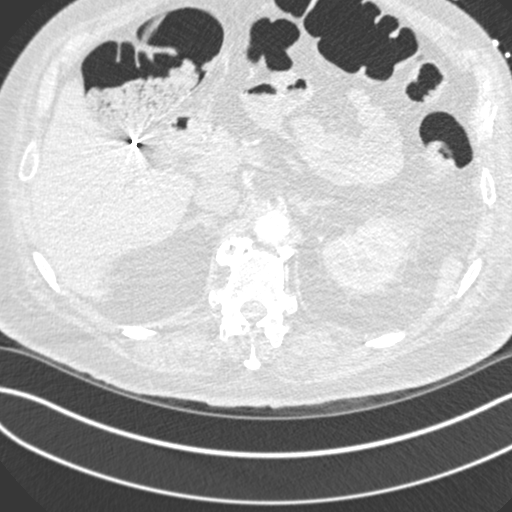
[im 32/287  mediastinal]
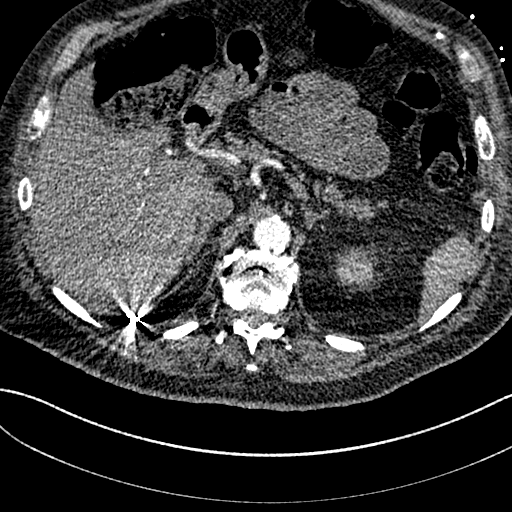
[im 48/287  lung]
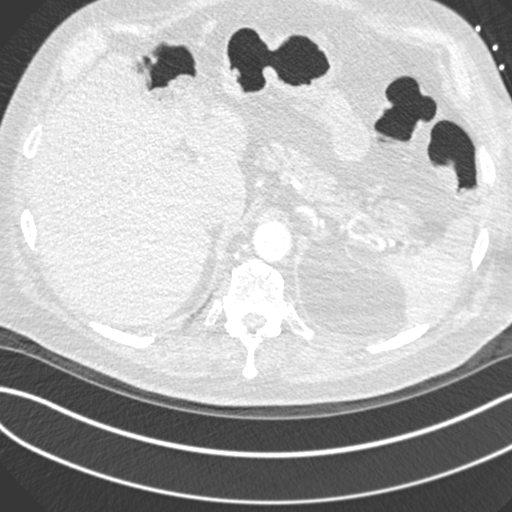
[im 64/287  mediastinal]
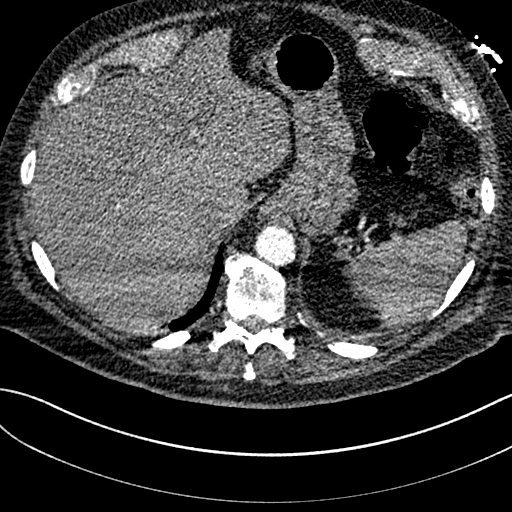
[im 80/287  lung]
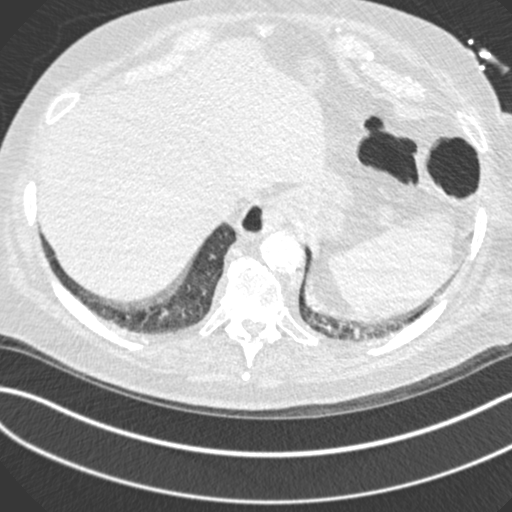
[im 96/287  mediastinal]
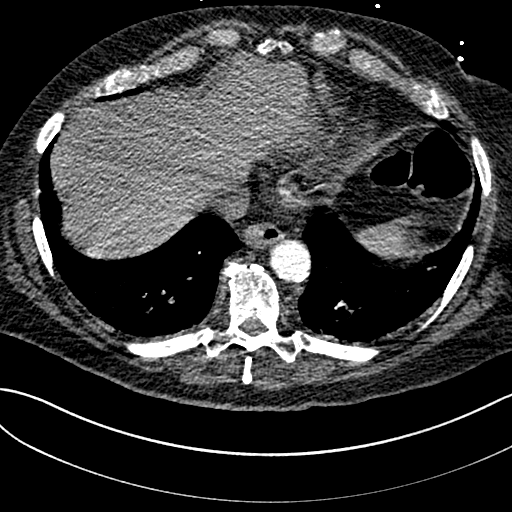
[im 112/287  lung]
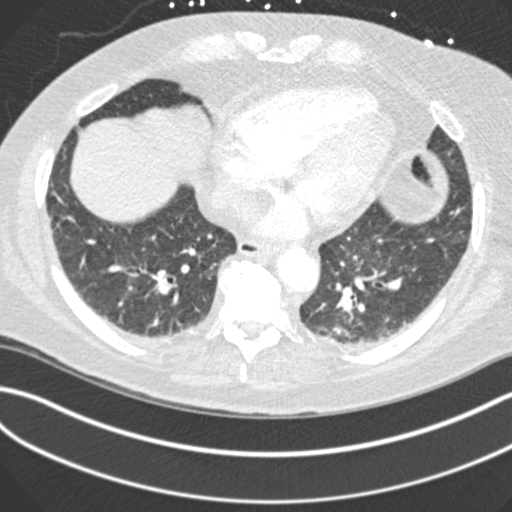
[im 128/287  mediastinal]
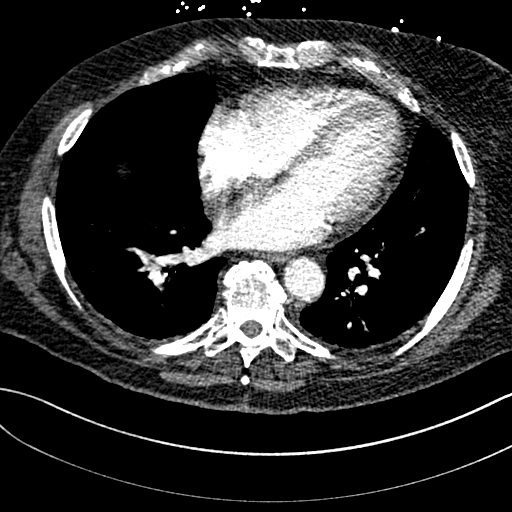
[im 144/287  lung]
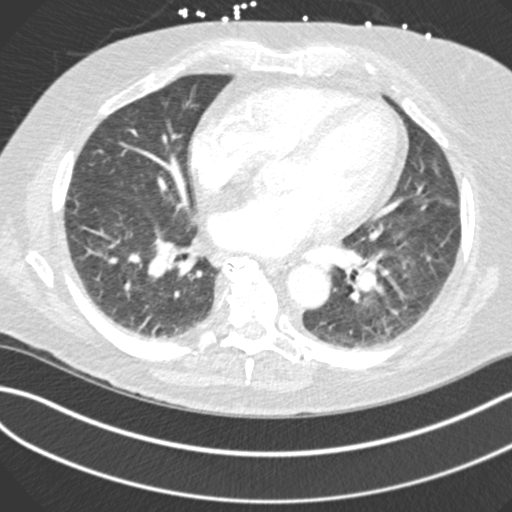
[im 159/287  mediastinal]
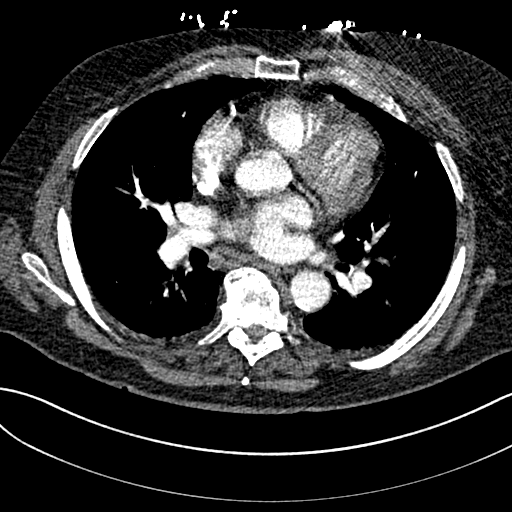
[im 175/287  lung]
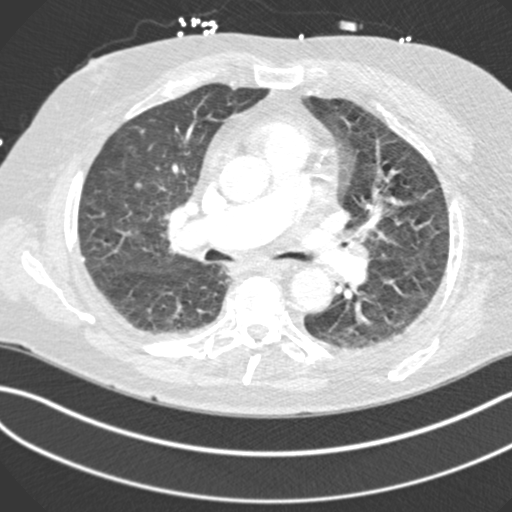
[im 191/287  mediastinal]
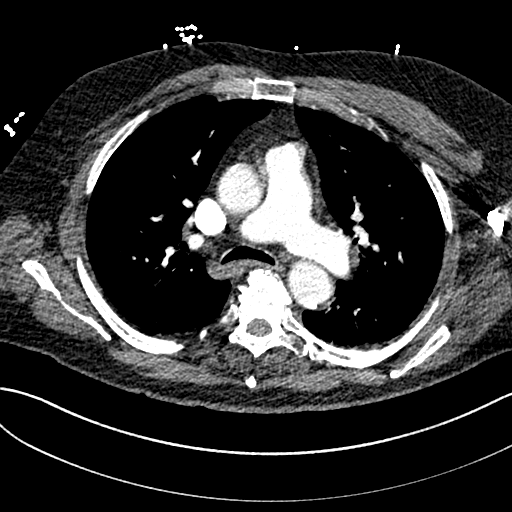
[im 207/287  lung]
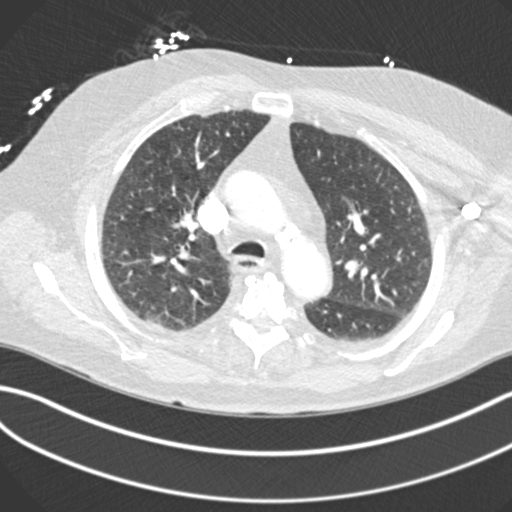
[im 223/287  mediastinal]
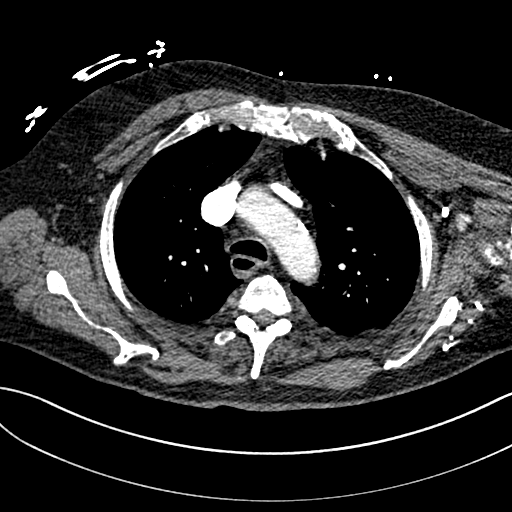
[im 239/287  lung]
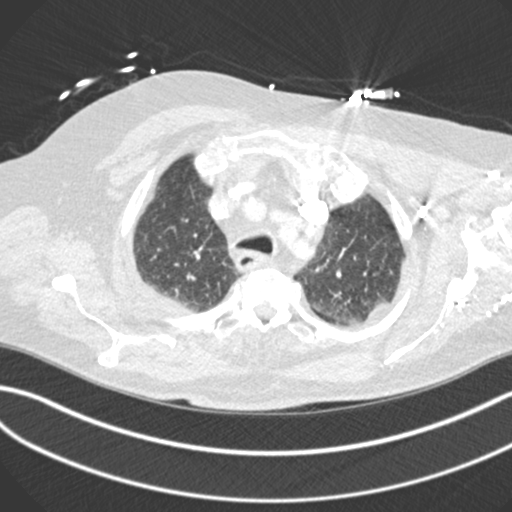
[im 255/287  mediastinal]
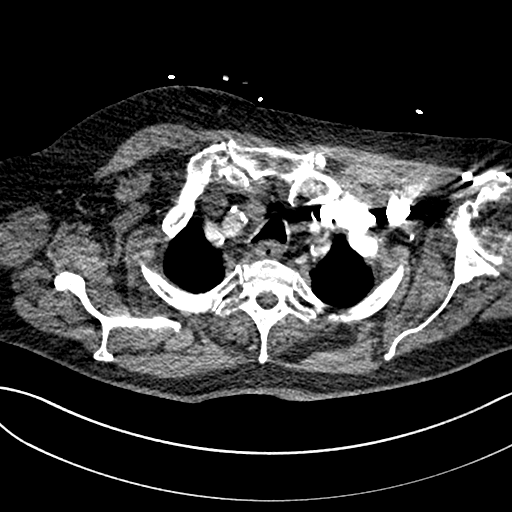
[im 271/287  lung]
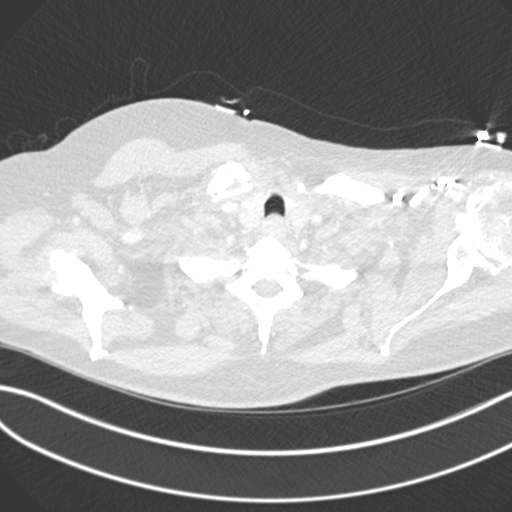

[Series 9: coronal mpr · coronal · 0.58mm/px · 1 of 165 slices shown]
[im 83/165  mediastinal]
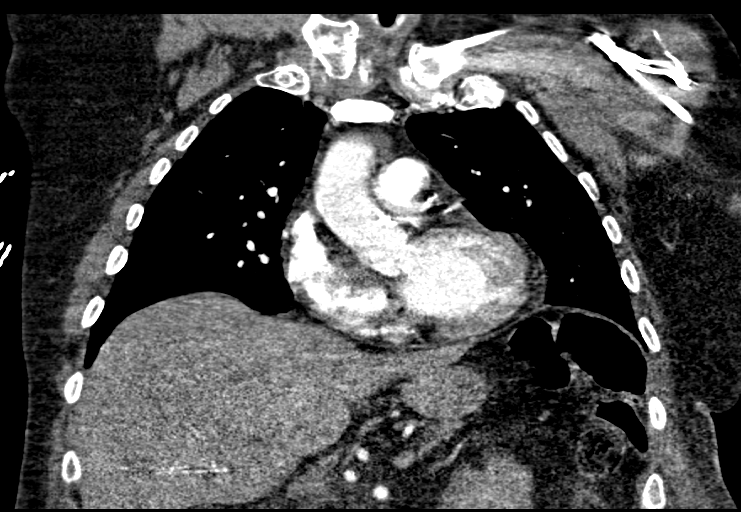

[18 of 36 positions shown; findings below may reference images not displayed]

FINDINGS: Cardiovascular: There is moderate to marked severity calcification
of the aortic arch. Satisfactory opacification of the pulmonary
arteries to the segmental level. No evidence of pulmonary embolism.
Normal heart size. No pericardial effusion. Moderate severity
coronary artery calcification is seen.

Mediastinum/Nodes: No enlarged mediastinal, hilar, or axillary lymph
nodes. Thyroid gland, trachea, and esophagus demonstrate no
significant findings.

Lungs/Pleura: Lungs are clear. No pleural effusion or pneumothorax.

Upper Abdomen: There is a small hiatal hernia.

Musculoskeletal: Multilevel degenerative changes seen throughout the
thoracic spine.

Review of the MIP images confirms the above findings.
IMPRESSION: 1. No evidence of pulmonary embolus.
2. Moderate to marked severity calcification of the aortic arch.
3. Moderate severity coronary artery calcification.
4. Small hiatal hernia.
5. Multilevel degenerative changes throughout the thoracic spine.

Aortic Atherosclerosis ([id number]).

## 2022-04-09 NOTE — Progress Notes (Signed)
Primary Physician/Referring:  Ginger Organ., MD  Patient ID: Colton Miranda, male    DOB: 09/29/1940, 81 y.o.   MRN: [id number]  Chief Complaint  Patient presents with   Syncope and collapse   DOE   Atrial Fibrillation   Follow-up    4 weeks   HPI:    Colton Miranda  is a 81 y.o. 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   Patient was admitted to the hospital on 02/07/2022 and discharged the following day with syncope and collapse felt to be due to vasovagal episode from abdominal discomfort and constipation.  He had uneventful stay in the hospital and did not have any significant arrhythmias on telemetry but was recommended outpatient follow-up.  He has not had any recurrence of syncope and no change in chronic dyspnea.  He has limited physical activity.  I had seen him 4 weeks ago, I will reduce the dose of lisinopril from 20 mg to 10 mg due to orthostatic hypotension.  Except for episodes of constipation, he has no specific complaints.  Past Medical History:  Diagnosis Date   BPH associated with nocturia    Chronic constipation    COPD (chronic obstructive pulmonary disease) (HCC)    Depression    Eczema    Heart murmur    at birth   History of adenomatous polyp of colon    History of basal cell carcinoma (BCC) of skin    post excision from trunk   History of CVA (cerebrovascular accident) without residual deficits    11-06-2017 per pt and wife "stroke was approx. 20 years ago , 1999, caused by blood clot after knee scope"  DVT to PE   History of depression    over 40 years ago nervous breakdown   History of DVT (deep vein thrombosis)    11-06-2017 per pt and wife happened lower leg approx. 1999 after knee scope    History of pulmonary embolus (PE)    11-06-2017 from DVT in approx. 1999 per wife and pt   HOH (hard of hearing)    Hyperlipidemia    Hypertension    followed by pcp   Long term (current) use of anticoagulants Coumadin-- followed by Pharmasist w/ [facility]   secondary to primary hypercoagulopathy w/ hx DVT and PE   Myalgia and myositis, unspecified    OA (osteoarthritis)    "all joints"   Peripheral neuropathy    Pinched nerve in neck    per pt causes intermittant numbness bilateral arms and hands   Primary hypercoagulable state ([place])    Pro[facility]) urologist-  dr Alyson Ingles  oncologist-- dr Tammi Klippel   dx 08-28-2017  Stage T1c, Gleason 4+4, PSA 14.1, vol 42.8cc-- planned treatment external radiation and ADT   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[place])    Protein S deficiency ([place])    Right bundle branch block    Seborrheic dermatitis, unspecified    Stroke ([place])    Type 2 diabetes mellitus ([place])    Urinary frequency    Wears glasses    Social History   Tobacco Use   Smoking status: Former    Packs/day: 0.50    Years: 50.00    Total pack years: 25.00    Types: Cigarettes  Quit date: 01/03/2009    Years since quitting: 13.2   Smokeless tobacco: Never  Substance Use Topics   Alcohol use: Yes    Alcohol/week: 3.0 standard drinks of alcohol    Types: 3 Cans of beer per week    Comment: 1-2 beers a week   Marital Status: Married   ROS  Review of Systems  Cardiovascular:  Positive for dyspnea on exertion. Negative for chest pain and leg swelling.   Objective  Blood pressure 125/68, pulse 61, temperature 98.7 F (37.1 C), temperature source Temporal, resp. rate 16, height 5' 7"  (1.702 m), weight 232 lb 3.2 oz (105.3 kg), SpO2 97 %.     04/09/2022    3:03 PM 03/10/2022   12:45 PM 02/08/2022    3:13 PM  Vitals with BMI  Height 5' 7"  5' 7"    Weight 232 lbs 3 oz 231 lbs 3 oz   BMI 53.29 92.4   Systolic 268 341 962  Diastolic 68 65 66  Pulse 61 60 63    Orthostatic  VS for the past 72 hrs (Last 3 readings):  Orthostatic BP Patient Position BP Location Cuff Size Orthostatic Pulse  04/09/22 1540 108/70 Standing Left Arm Normal 70  04/09/22 1539 119/72 Sitting Left Arm Large 62  04/09/22 1538 130/73 Supine Left Arm Large 60     Physical Exam Constitutional:      Comments: He is well built and moderately obese in no acute distress.  Neck:     Thyroid: No thyromegaly.     Vascular: No JVD.  Cardiovascular:     Rate and Rhythm: Normal rate and regular rhythm.     Pulses:          Carotid pulses are 2+ on the right side and 2+ on the left side.      Popliteal pulses are 1+ on the right side and 1+ on the left side.       Dorsalis pedis pulses are 0 on the right side and 0 on the left side.       Posterior tibial pulses are 0 on the right side and 0 on the left side.     Heart sounds: Normal heart sounds. No murmur heard.    No gallop.  Pulmonary:     Effort: Pulmonary effort is normal.     Breath sounds: Normal breath sounds.  Musculoskeletal:     Cervical back: No edema.     Right lower leg: No edema.     Left lower leg: No edema.  Skin:    Comments: Toenails are thick  with onychomycosis.    Laboratory examination:   Recent Labs    11/11/21 0056 02/07/22 1648 02/07/22 1659 02/08/22 0032  NA 130* 137 137 137  K 4.2 3.7 3.7 4.7  CL 99 104 102 108  CO2 23 23  --  22  GLUCOSE 140* 165* 162* 128*  BUN 27* 26* 26* 26*  CREATININE 1.03 1.33* 1.20 1.05  CALCIUM 8.7* 9.4  --  9.0  GFRNONAA >60 54*  --  >60    CrCl cannot be calculated (Patient's most recent lab result is older than the maximum 21 days allowed.).     Latest Ref Rng & Units 02/08/2022   12:32 AM 02/07/2022    4:59 PM 02/07/2022    4:48 PM  CMP  Glucose 70 - 99 mg/dL 128  162  165   BUN 8 - 23 mg/dL 26  26  26  Creatinine 0.61 - 1.24 mg/dL 1.05  1.20  1.33   Sodium 135 - 145 mmol/L 137  137  137   Potassium 3.5 - 5.1 mmol/L 4.7  3.7  3.7   Chloride 98 - 111 mmol/L  108  102  104   CO2 22 - 32 mmol/L 22   23   Calcium 8.9 - 10.3 mg/dL 9.0   9.4   Total Protein 6.5 - 8.1 g/dL 6.5   6.8   Total Bilirubin 0.3 - 1.2 mg/dL 0.4   0.3   Alkaline Phos 38 - 126 U/L 81   81   AST 15 - 41 U/L 24   27   ALT 0 - 44 U/L 27   27       Latest Ref Rng & Units 02/08/2022   12:32 AM 02/07/2022    4:59 PM 02/07/2022    4:48 PM  CBC  WBC 4.0 - 10.5 K/uL 12.2   9.1   Hemoglobin 13.0 - 17.0 g/dL 11.8  13.3  12.0   Hematocrit 39.0 - 52.0 % 37.1  39.0  38.8   Platelets 150 - 400 K/uL 258   289     HEMOGLOBIN A1C Lab Results  Component Value Date   HGBA1C 6.5 (H) 10/22/2021   MPG 139.85 10/22/2021   TSH No results for input(s): "TSH" in the last 8760 hours.   External labs:  Cholesterol, total 137.000 m 12/09/2021 HDL 52.000 mg 12/09/2021 LDL 70.000 mg 12/09/2021 Triglycerides 76.000 mg 12/09/2021  Allergies  No Known Allergies   Final Medications at End of Visit     Current Outpatient Medications:    Blood Glucose Monitoring Suppl (ONETOUCH VERIO) w/Device KIT, by Does not apply route. Check blood sugar twice daily as directed DX E11.65, Disp: , Rfl:    Cholecalciferol (VITAMIN D3) 50 MCG (2000 UT) TABS, Take 2,000 Units by mouth daily. , Disp: , Rfl:    ketoconazole (NIZORAL) 2 % cream, Apply 1 application topically daily., Disp: , Rfl:    lisinopril (ZESTRIL) 20 MG tablet, Take 10 mg by mouth daily., Disp: , Rfl:    metFORMIN (GLUCOPHAGE) 500 MG tablet, TAKE 1 TABLET BY MOUTH ONCE DAILY WITH BREAKFAST (Patient taking differently: Take 500 mg by mouth daily with breakfast.), Disp: 90 tablet, Rfl: 1   NYSTATIN powder, Apply 1 application topically 2 (two) times daily as needed for irritation., Disp: , Rfl:    ONETOUCH DELICA LANCETS 81W MISC, USE 1  TO CHECK GLUCOSE TWICE DAILY (Patient taking differently: 2 (two) times daily.), Disp: 100 each, Rfl: 12   ONETOUCH VERIO test strip, USE 1 STRIP TO CHECK GLUCOSE TWICE DAILY AS DIRECTED (Patient taking  differently: 2 (two) times daily.), Disp: 100 each, Rfl: 11   polyethylene glycol (MIRALAX / GLYCOLAX) 17 g packet, Take 17 g by mouth 2 (two) times daily. (Patient taking differently: Take 17 g by mouth daily as needed for mild constipation.), Disp: 14 each, Rfl: 0   senna-docusate (SENOKOT-S) 8.6-50 MG tablet, Take 1 tablet by mouth 2 (two) times daily., Disp: , Rfl:    simvastatin (ZOCOR) 20 MG tablet, TAKE 1 TABLET BY MOUTH ONCE DAILY FOR CHOLESTEROL (Patient taking differently: Take 20 mg by mouth daily.), Disp: 90 tablet, Rfl: 1   traMADol (ULTRAM) 50 MG tablet, Take one to two tablets by mouth every 6 hours as needed for pain. Do not take more than 6 tablets in 24 hours (Patient taking differently: Take 50 mg by mouth at  bedtime as needed for moderate pain.), Disp: 4 tablet, Rfl: 0   warfarin (COUMADIN) 10 MG tablet, TAKE 1 TABLET BY MOUTH ONCE DAILY ALONG  WITH  A  5  MG  TABLET  FOR  A  TOTAL  OF  15  MG  DAILY (Patient taking differently: Take 15-17.5 mg by mouth See admin instructions. Take along with the 5 mg tablet daily to make total of 15 mg by mouth daily except on Wednesday take 17.5 mg per wife), Disp: 90 tablet, Rfl: 1   warfarin (COUMADIN) 5 MG tablet, Take 15-17.5 mg by mouth See admin instructions.  Take 5 mg daily along with the 10 mg tablet to make total of 15 mg daily.. Then take  17.5 mg only on Wednesday per wife, Disp: , Rfl:     Radiology:   CT angiogram chest 02/13/2020: Cardiovascular: There is moderate to marked severity calcification of the aortic arch. Satisfactory opacification of the pulmonary arteries to the segmental level. Normal heart size. No pericardial effusion. Moderate severity coronary artery calcification is seen. No evidence of pulmonary embolism. Small hiatal hernia. Multilevel degenerative changes throughout the thoracic spine.  CT angiogram chest 02/07/2022: 1. No evidence for aortic aneurysm or dissection. 2. No acute localizing process in the  chest, abdomen or pelvis. 3. Large stool burden compatible with constipation. 4.  Aortic Atherosclerosis (ICD10-I70.0).  Cardiac Studies:   Lexiscan Tetrofosmin Stress Test 10/12/2020: Nondiagnostic ECG stress. Moderate degree medium extent fixed perfusion defect located in the apical inferior wall, mid inferior wall and basal inferior wall consistent with diaphragmatic attenuation. Minimal lateral ischemia cannot be excluded. Gated SPECT imaging of the left ventricle was normal. All segments of left ventricle demonstrated normal wall motion and thickening. Stress LV EF is normal 58%.  No previous exam available for comparison. Low risk.   Carotid artery duplex 10/12/2020 Stenosis in the right internal carotid artery (1-15%) due to heterogeneous plaque. Stenosis in the right common carotid artery and bifurcation (<50%). Antegrade right vertebral artery flow.  Mild stenosis in the left proximal internal carotid artery (1-15%) due to heterogenous plaque. Mid to distal LICA not visualized (neck immobile). Stenosis in the left common carotid artery (<50%). Stenosis in the left external carotid artery (<50%). Left vertebral artery not well visualized; however, by doppler predominantly antegrade flow. No prior studies for comparison. Follow up in one year is appropriate if clinically indicated.  PCV ECHOCARDIOGRAM COMPLETE 02/11/2022  Normal LV systolic function with visual EF 55-60%. Left ventricle cavity is normal in size. Normal left ventricular wall thickness. Normal global wall motion. Normal diastolic filling pattern, normal LAP. No significant valvular heart disease. Compared to 01/220/2022 G2DD is now normal, Mild AR/MR/TR have improved, otherwise no significant change.   Zio Patch Extended out patient EKG monitoring 14 days starting 02/14/2022 :  Preliminary data suggest no significant heart block, no significant arrhythmias, no atrial fibrillation.  No symptoms reported.  EKG:  EKG  03/10/2022: Normal sinus rhythm with rate of 61 bpm, normal axis, right bundle branch block.  Low-voltage complexes.  No evidence of ischemia.  No significant change from 05/09/2021.  Assessment     ICD-10-CM   1. Syncope and collapse  R55     2. 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 Primary hypertension  I10      CHA2DS2-VASc Score is 5.  Yearly risk of stroke: 7.2% (A, HTN, DM, Vasc Dz).  Score of 1=0.6; 2=2.2; 3=3.2; 4=4.8; 5=7.2; 6=9.8; 7=>9.8) -(CHF;  HTN; vasc disease DM,  Male = 1; Age <65 =0; 65-74 = 1,  >75 =2; stroke/embolism= 2).     No orders of the defined types were placed in this encounter.  There are no discontinued medications.   No orders of the defined types were placed in this encounter.     Recommendations:    Colton Miranda  is a 81 y.o. 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   Patient was admitted to the hospital on 02/07/2022 and discharged the following day with syncope and collapse felt to be due to vasovagal episode from abdominal discomfort and constipation.  He had uneventful stay in the hospital and did not have any significant arrhythmias on telemetry but was recommended outpatient follow-up.  I have seen him 4 weeks ago and he was severely orthostatic.  I reduce the dose of lisinopril from 20 mg to 10 mg daily.  He has not had any further episodes of dizziness.  He still orthostatic by about 20-25 points between supine and standing positions.  Clear-cut instructions given to the patient and his wife if he has any diarrhea or fever or chills, he probably should hold his medications and also to check his blood pressure frequently at that time especially standing blood pressure to avoid  syncope.  Otherwise from cardiac standpoint he is doing well.  I will see him back in a year.    [name], MD, [facility] 04/09/2022, 3:54 PM Office: [phone] Fax: [phone] Pager: [phone]

## 2022-04-17 ENCOUNTER — Encounter: Payer: Self-pay | Admitting: Orthopaedic Surgery

## 2022-04-17 ENCOUNTER — Ambulatory Visit (INDEPENDENT_AMBULATORY_CARE_PROVIDER_SITE_OTHER): Payer: PPO

## 2022-04-17 ENCOUNTER — Ambulatory Visit: Payer: PPO | Admitting: Orthopaedic Surgery

## 2022-04-17 DIAGNOSIS — Z96642 Presence of left artificial hip joint: Secondary | ICD-10-CM

## 2022-04-17 DIAGNOSIS — M25561 Pain in right knee: Secondary | ICD-10-CM

## 2022-04-17 DIAGNOSIS — G8929 Other chronic pain: Secondary | ICD-10-CM

## 2022-04-17 MED ORDER — LIDOCAINE HCL 1 % IJ SOLN
3.0000 mL | INTRAMUSCULAR | Status: AC | PRN
  Administered 2022-04-17: 3 mL

## 2022-04-17 MED ORDER — METHYLPREDNISOLONE ACETATE 40 MG/ML IJ SUSP
40.0000 mg | INTRAMUSCULAR | Status: AC | PRN
  Administered 2022-04-17: 40 mg via INTRA_ARTICULAR

## 2022-04-17 NOTE — Progress Notes (Signed)
The patient is now over 5 months status post a left total hip arthroplasty through a direct anterior approach.  He states that he is doing well from a hip replacement standpoint.  He is 81 years old.  He has a remote history of a right hip replacement and a left knee replacement.  His wife is with him and said that his left knee is doing well but the right knee is really worn out and they both feel that the next step for him will be addressing his right knee.  He is not ready for knee replacement yet he states.  He has not had any injections in a long time.  His more recent left operative hip moves smoothly and fluidly.  His right hip also moves smoothly.  The left knee moves well.  The right knee has medial joint line tenderness and patellofemoral crepitation.  An AP pelvis and lateral of his recent left hip shows bilateral total hip arthroplasties that are well-seated.  We will go and start conservative with his right knee.  I did place a steroid injection in his right knee which he tolerated well.  He did request this.  We have not x-rayed that knee in a long period of time and even x-rays in 2010 of the right knee showed severe arthritis.  He may be a candidate later for hyaluronic acid.  I would like to see him back in 3 months and at that visit would like an AP and lateral of his right knee.  All question concerns were answered and addressed.  Procedure Note  Patient: Colton Miranda             Date of Birth: [birth year]           MRN: [id number]             Visit Date: 04/17/2022  Procedures: Visit Diagnoses:  1. History of left hip replacement   2. Chronic pain of right knee     Large Joint Inj: R knee on 04/17/2022 2:52 PM Indications: diagnostic evaluation and pain Details: 22 G 1.5 in needle, superolateral approach  Arthrogram: No  Medications: 3 mL lidocaine 1 %; 40 mg methylPREDNISolone acetate 40 MG/ML Outcome: tolerated well, no immediate complications Procedure, treatment  alternatives, risks and benefits explained, specific risks discussed. Consent was given by the patient. Immediately prior to procedure a time out was called to verify the correct patient, procedure, equipment, support staff and site/side marked as required. Patient was prepped and draped in the usual sterile fashion.

## 2022-05-06 DIAGNOSIS — Z7901 Long term (current) use of anticoagulants: Secondary | ICD-10-CM | POA: Diagnosis not present

## 2022-05-06 DIAGNOSIS — E1169 Type 2 diabetes mellitus with other specified complication: Secondary | ICD-10-CM | POA: Diagnosis not present

## 2022-05-06 DIAGNOSIS — I480 Paroxysmal atrial fibrillation: Secondary | ICD-10-CM | POA: Diagnosis not present

## 2022-05-06 DIAGNOSIS — D6869 Other thrombophilia: Secondary | ICD-10-CM | POA: Diagnosis not present

## 2022-05-06 DIAGNOSIS — Z86718 Personal history of other venous thrombosis and embolism: Secondary | ICD-10-CM | POA: Diagnosis not present

## 2022-05-07 DIAGNOSIS — M1990 Unspecified osteoarthritis, unspecified site: Secondary | ICD-10-CM | POA: Diagnosis not present

## 2022-05-07 DIAGNOSIS — Z7409 Other reduced mobility: Secondary | ICD-10-CM | POA: Diagnosis not present

## 2022-05-07 DIAGNOSIS — L89152 Pressure ulcer of sacral region, stage 2: Secondary | ICD-10-CM | POA: Diagnosis not present

## 2022-05-19 DIAGNOSIS — E1151 Type 2 diabetes mellitus with diabetic peripheral angiopathy without gangrene: Secondary | ICD-10-CM | POA: Diagnosis not present

## 2022-05-19 DIAGNOSIS — M79675 Pain in left toe(s): Secondary | ICD-10-CM | POA: Diagnosis not present

## 2022-05-19 DIAGNOSIS — B351 Tinea unguium: Secondary | ICD-10-CM | POA: Diagnosis not present

## 2022-05-19 DIAGNOSIS — L603 Nail dystrophy: Secondary | ICD-10-CM | POA: Diagnosis not present

## 2022-05-19 DIAGNOSIS — L84 Corns and callosities: Secondary | ICD-10-CM | POA: Diagnosis not present

## 2022-05-19 DIAGNOSIS — M79674 Pain in right toe(s): Secondary | ICD-10-CM | POA: Diagnosis not present

## 2022-06-07 DIAGNOSIS — Z7409 Other reduced mobility: Secondary | ICD-10-CM | POA: Diagnosis not present

## 2022-06-07 DIAGNOSIS — L89152 Pressure ulcer of sacral region, stage 2: Secondary | ICD-10-CM | POA: Diagnosis not present

## 2022-06-07 DIAGNOSIS — M1990 Unspecified osteoarthritis, unspecified site: Secondary | ICD-10-CM | POA: Diagnosis not present

## 2022-06-10 DIAGNOSIS — Z86718 Personal history of other venous thrombosis and embolism: Secondary | ICD-10-CM | POA: Diagnosis not present

## 2022-06-10 DIAGNOSIS — I480 Paroxysmal atrial fibrillation: Secondary | ICD-10-CM | POA: Diagnosis not present

## 2022-06-10 DIAGNOSIS — D6869 Other thrombophilia: Secondary | ICD-10-CM | POA: Diagnosis not present

## 2022-06-10 DIAGNOSIS — Z7901 Long term (current) use of anticoagulants: Secondary | ICD-10-CM | POA: Diagnosis not present

## 2022-06-11 DIAGNOSIS — L821 Other seborrheic keratosis: Secondary | ICD-10-CM | POA: Diagnosis not present

## 2022-06-11 DIAGNOSIS — L817 Pigmented purpuric dermatosis: Secondary | ICD-10-CM | POA: Diagnosis not present

## 2022-06-11 DIAGNOSIS — Z85828 Personal history of other malignant neoplasm of skin: Secondary | ICD-10-CM | POA: Diagnosis not present

## 2022-06-11 DIAGNOSIS — D225 Melanocytic nevi of trunk: Secondary | ICD-10-CM | POA: Diagnosis not present

## 2022-06-11 DIAGNOSIS — D2239 Melanocytic nevi of other parts of face: Secondary | ICD-10-CM | POA: Diagnosis not present

## 2022-06-11 DIAGNOSIS — D692 Other nonthrombocytopenic purpura: Secondary | ICD-10-CM | POA: Diagnosis not present

## 2022-06-11 DIAGNOSIS — L905 Scar conditions and fibrosis of skin: Secondary | ICD-10-CM | POA: Diagnosis not present

## 2022-06-11 DIAGNOSIS — D1801 Hemangioma of skin and subcutaneous tissue: Secondary | ICD-10-CM | POA: Diagnosis not present

## 2022-06-26 DIAGNOSIS — I480 Paroxysmal atrial fibrillation: Secondary | ICD-10-CM | POA: Diagnosis not present

## 2022-06-26 DIAGNOSIS — I77811 Abdominal aortic ectasia: Secondary | ICD-10-CM | POA: Diagnosis not present

## 2022-06-26 DIAGNOSIS — D6869 Other thrombophilia: Secondary | ICD-10-CM | POA: Diagnosis not present

## 2022-06-26 DIAGNOSIS — G3184 Mild cognitive impairment, so stated: Secondary | ICD-10-CM | POA: Diagnosis not present

## 2022-06-26 DIAGNOSIS — I69322 Dysarthria following cerebral infarction: Secondary | ICD-10-CM | POA: Diagnosis not present

## 2022-06-26 DIAGNOSIS — Z86010 Personal history of colonic polyps: Secondary | ICD-10-CM | POA: Diagnosis not present

## 2022-06-26 DIAGNOSIS — Z7901 Long term (current) use of anticoagulants: Secondary | ICD-10-CM | POA: Diagnosis not present

## 2022-06-26 DIAGNOSIS — E1169 Type 2 diabetes mellitus with other specified complication: Secondary | ICD-10-CM | POA: Diagnosis not present

## 2022-06-26 DIAGNOSIS — I10 Essential (primary) hypertension: Secondary | ICD-10-CM | POA: Diagnosis not present

## 2022-06-26 DIAGNOSIS — Z8546 Personal history of malignant neoplasm of prostate: Secondary | ICD-10-CM | POA: Diagnosis not present

## 2022-06-26 DIAGNOSIS — Z86718 Personal history of other venous thrombosis and embolism: Secondary | ICD-10-CM | POA: Diagnosis not present

## 2022-07-07 DIAGNOSIS — L89152 Pressure ulcer of sacral region, stage 2: Secondary | ICD-10-CM | POA: Diagnosis not present

## 2022-07-07 DIAGNOSIS — M1990 Unspecified osteoarthritis, unspecified site: Secondary | ICD-10-CM | POA: Diagnosis not present

## 2022-07-07 DIAGNOSIS — Z7409 Other reduced mobility: Secondary | ICD-10-CM | POA: Diagnosis not present

## 2022-07-10 DIAGNOSIS — I480 Paroxysmal atrial fibrillation: Secondary | ICD-10-CM | POA: Diagnosis not present

## 2022-07-10 DIAGNOSIS — D6869 Other thrombophilia: Secondary | ICD-10-CM | POA: Diagnosis not present

## 2022-07-10 DIAGNOSIS — Z86718 Personal history of other venous thrombosis and embolism: Secondary | ICD-10-CM | POA: Diagnosis not present

## 2022-07-10 DIAGNOSIS — Z7901 Long term (current) use of anticoagulants: Secondary | ICD-10-CM | POA: Diagnosis not present

## 2022-07-21 ENCOUNTER — Encounter: Payer: Self-pay | Admitting: Orthopaedic Surgery

## 2022-07-21 ENCOUNTER — Ambulatory Visit (INDEPENDENT_AMBULATORY_CARE_PROVIDER_SITE_OTHER): Payer: PPO

## 2022-07-21 ENCOUNTER — Ambulatory Visit: Payer: PPO | Admitting: Orthopaedic Surgery

## 2022-07-21 DIAGNOSIS — G8929 Other chronic pain: Secondary | ICD-10-CM | POA: Diagnosis not present

## 2022-07-21 DIAGNOSIS — M1711 Unilateral primary osteoarthritis, right knee: Secondary | ICD-10-CM | POA: Diagnosis not present

## 2022-07-21 DIAGNOSIS — M25561 Pain in right knee: Secondary | ICD-10-CM

## 2022-07-21 MED ORDER — METHYLPREDNISOLONE ACETATE 40 MG/ML IJ SUSP
40.0000 mg | INTRAMUSCULAR | Status: AC | PRN
  Administered 2022-07-21: 40 mg via INTRA_ARTICULAR

## 2022-07-21 MED ORDER — LIDOCAINE HCL 1 % IJ SOLN
3.0000 mL | INTRAMUSCULAR | Status: AC | PRN
  Administered 2022-07-21: 3 mL

## 2022-07-21 NOTE — Progress Notes (Signed)
The patient is an 81 year old gentleman well-known to me.  We replaced his left hip through an anterior approach earlier this year.  He has a remote history of a right posterior hip replacement and a left knee replacement.  We actually placed a steroid injection just over 3 months ago in his right knee.  We did not x-ray the knee so we need to x-ray it today.  We last x-rayed his hip in July and his pelvis and hip was doing well.  He says he only has a little bit of pain and stiffness first thing in the morning with his right knee because he had sleep on his back.  Examination his right knee does show significant patellofemoral crepitation and popping in the knee especially at the medial compartment and patellofemoral joint.  There is varus malalignment that is correctable and significant medial joint line tenderness with a mild effusion.  X-rays of the right knee show severe end-stage tricompartmental arthritis.  He does have a left knee replacement and again both his hips were placed.  He is only wanting a steroid injection in his right knee today and I agree that this.  Is been over 3 months since his last injection.  At some point he may consider knee replacement.  He does need to continue to work on his health and losing weight as well but we can certainly address knee replacement at any point when he gets to where it is detrimentally affecting his mobility, his quality of life and his actives daily living.  Per his request I did place a steroid injection in his right knee that he tolerated well.  He knows to wait at least 3 to 4 months between injections.  At this point follow-up can be as needed unless things worsen.      Procedure Note  Patient: Colton Miranda             Date of Birth: Nov 12, 1940           MRN: [id number]             Visit Date: 07/21/2022  Procedures: Visit Diagnoses:  1. Chronic pain of right knee   2. Unilateral primary osteoarthritis, right knee     Large  Joint Inj: R knee on 07/21/2022 2:53 PM Indications: diagnostic evaluation and pain Details: 22 G 1.5 in needle, superolateral approach  Arthrogram: No  Medications: 3 mL lidocaine 1 %; 40 mg methylPREDNISolone acetate 40 MG/ML Outcome: tolerated well, no immediate complications Procedure, treatment alternatives, risks and benefits explained, specific risks discussed. Consent was given by the patient. Immediately prior to procedure a time out was called to verify the correct patient, procedure, equipment, support staff and site/side marked as required. Patient was prepped and draped in the usual sterile fashion.

## 2022-08-07 DIAGNOSIS — Z7409 Other reduced mobility: Secondary | ICD-10-CM | POA: Diagnosis not present

## 2022-08-07 DIAGNOSIS — L89152 Pressure ulcer of sacral region, stage 2: Secondary | ICD-10-CM | POA: Diagnosis not present

## 2022-08-07 DIAGNOSIS — M1990 Unspecified osteoarthritis, unspecified site: Secondary | ICD-10-CM | POA: Diagnosis not present

## 2022-08-12 DIAGNOSIS — D6869 Other thrombophilia: Secondary | ICD-10-CM | POA: Diagnosis not present

## 2022-08-12 DIAGNOSIS — Z7901 Long term (current) use of anticoagulants: Secondary | ICD-10-CM | POA: Diagnosis not present

## 2022-08-12 DIAGNOSIS — Z86718 Personal history of other venous thrombosis and embolism: Secondary | ICD-10-CM | POA: Diagnosis not present

## 2022-08-12 DIAGNOSIS — I480 Paroxysmal atrial fibrillation: Secondary | ICD-10-CM | POA: Diagnosis not present

## 2022-08-19 DIAGNOSIS — L603 Nail dystrophy: Secondary | ICD-10-CM | POA: Diagnosis not present

## 2022-08-19 DIAGNOSIS — E1151 Type 2 diabetes mellitus with diabetic peripheral angiopathy without gangrene: Secondary | ICD-10-CM | POA: Diagnosis not present

## 2022-08-19 DIAGNOSIS — M79674 Pain in right toe(s): Secondary | ICD-10-CM | POA: Diagnosis not present

## 2022-08-19 DIAGNOSIS — L84 Corns and callosities: Secondary | ICD-10-CM | POA: Diagnosis not present

## 2022-08-19 DIAGNOSIS — B351 Tinea unguium: Secondary | ICD-10-CM | POA: Diagnosis not present

## 2022-08-19 DIAGNOSIS — M79675 Pain in left toe(s): Secondary | ICD-10-CM | POA: Diagnosis not present

## 2022-09-04 DIAGNOSIS — Z7901 Long term (current) use of anticoagulants: Secondary | ICD-10-CM | POA: Diagnosis not present

## 2022-09-04 DIAGNOSIS — Z86718 Personal history of other venous thrombosis and embolism: Secondary | ICD-10-CM | POA: Diagnosis not present

## 2022-09-04 DIAGNOSIS — I480 Paroxysmal atrial fibrillation: Secondary | ICD-10-CM | POA: Diagnosis not present

## 2022-09-04 DIAGNOSIS — D6869 Other thrombophilia: Secondary | ICD-10-CM | POA: Diagnosis not present

## 2022-09-06 DIAGNOSIS — M1990 Unspecified osteoarthritis, unspecified site: Secondary | ICD-10-CM | POA: Diagnosis not present

## 2022-09-06 DIAGNOSIS — Z7409 Other reduced mobility: Secondary | ICD-10-CM | POA: Diagnosis not present

## 2022-09-06 DIAGNOSIS — L89152 Pressure ulcer of sacral region, stage 2: Secondary | ICD-10-CM | POA: Diagnosis not present

## 2022-09-09 DIAGNOSIS — H2513 Age-related nuclear cataract, bilateral: Secondary | ICD-10-CM | POA: Diagnosis not present

## 2022-09-09 DIAGNOSIS — E119 Type 2 diabetes mellitus without complications: Secondary | ICD-10-CM | POA: Diagnosis not present

## 2022-09-09 DIAGNOSIS — H04123 Dry eye syndrome of bilateral lacrimal glands: Secondary | ICD-10-CM | POA: Diagnosis not present

## 2022-10-07 DIAGNOSIS — L89152 Pressure ulcer of sacral region, stage 2: Secondary | ICD-10-CM | POA: Diagnosis not present

## 2022-10-07 DIAGNOSIS — Z7409 Other reduced mobility: Secondary | ICD-10-CM | POA: Diagnosis not present

## 2022-10-07 DIAGNOSIS — M1990 Unspecified osteoarthritis, unspecified site: Secondary | ICD-10-CM | POA: Diagnosis not present

## 2022-10-08 DIAGNOSIS — Z7901 Long term (current) use of anticoagulants: Secondary | ICD-10-CM | POA: Diagnosis not present

## 2022-10-08 DIAGNOSIS — Z86718 Personal history of other venous thrombosis and embolism: Secondary | ICD-10-CM | POA: Diagnosis not present

## 2022-10-08 DIAGNOSIS — D6869 Other thrombophilia: Secondary | ICD-10-CM | POA: Diagnosis not present

## 2022-10-08 DIAGNOSIS — I480 Paroxysmal atrial fibrillation: Secondary | ICD-10-CM | POA: Diagnosis not present

## 2022-11-06 DIAGNOSIS — D6869 Other thrombophilia: Secondary | ICD-10-CM | POA: Diagnosis not present

## 2022-11-06 DIAGNOSIS — Z86718 Personal history of other venous thrombosis and embolism: Secondary | ICD-10-CM | POA: Diagnosis not present

## 2022-11-06 DIAGNOSIS — I480 Paroxysmal atrial fibrillation: Secondary | ICD-10-CM | POA: Diagnosis not present

## 2022-11-06 DIAGNOSIS — Z7901 Long term (current) use of anticoagulants: Secondary | ICD-10-CM | POA: Diagnosis not present

## 2022-11-07 DIAGNOSIS — Z7409 Other reduced mobility: Secondary | ICD-10-CM | POA: Diagnosis not present

## 2022-11-07 DIAGNOSIS — L89152 Pressure ulcer of sacral region, stage 2: Secondary | ICD-10-CM | POA: Diagnosis not present

## 2022-11-07 DIAGNOSIS — M1990 Unspecified osteoarthritis, unspecified site: Secondary | ICD-10-CM | POA: Diagnosis not present

## 2022-11-20 DIAGNOSIS — B351 Tinea unguium: Secondary | ICD-10-CM | POA: Diagnosis not present

## 2022-11-20 DIAGNOSIS — L84 Corns and callosities: Secondary | ICD-10-CM | POA: Diagnosis not present

## 2022-11-20 DIAGNOSIS — M79675 Pain in left toe(s): Secondary | ICD-10-CM | POA: Diagnosis not present

## 2022-11-20 DIAGNOSIS — L603 Nail dystrophy: Secondary | ICD-10-CM | POA: Diagnosis not present

## 2022-11-20 DIAGNOSIS — E1151 Type 2 diabetes mellitus with diabetic peripheral angiopathy without gangrene: Secondary | ICD-10-CM | POA: Diagnosis not present

## 2022-11-20 DIAGNOSIS — M79674 Pain in right toe(s): Secondary | ICD-10-CM | POA: Diagnosis not present

## 2022-11-27 ENCOUNTER — Encounter: Payer: Self-pay | Admitting: Radiology

## 2022-12-04 DIAGNOSIS — Z7901 Long term (current) use of anticoagulants: Secondary | ICD-10-CM | POA: Diagnosis not present

## 2022-12-04 DIAGNOSIS — I480 Paroxysmal atrial fibrillation: Secondary | ICD-10-CM | POA: Diagnosis not present

## 2022-12-04 DIAGNOSIS — D6869 Other thrombophilia: Secondary | ICD-10-CM | POA: Diagnosis not present

## 2022-12-04 DIAGNOSIS — Z86718 Personal history of other venous thrombosis and embolism: Secondary | ICD-10-CM | POA: Diagnosis not present

## 2022-12-06 DIAGNOSIS — Z7409 Other reduced mobility: Secondary | ICD-10-CM | POA: Diagnosis not present

## 2022-12-06 DIAGNOSIS — M1990 Unspecified osteoarthritis, unspecified site: Secondary | ICD-10-CM | POA: Diagnosis not present

## 2022-12-06 DIAGNOSIS — L89152 Pressure ulcer of sacral region, stage 2: Secondary | ICD-10-CM | POA: Diagnosis not present

## 2022-12-25 DIAGNOSIS — R7989 Other specified abnormal findings of blood chemistry: Secondary | ICD-10-CM | POA: Diagnosis not present

## 2022-12-25 DIAGNOSIS — I10 Essential (primary) hypertension: Secondary | ICD-10-CM | POA: Diagnosis not present

## 2022-12-25 DIAGNOSIS — E785 Hyperlipidemia, unspecified: Secondary | ICD-10-CM | POA: Diagnosis not present

## 2022-12-25 DIAGNOSIS — D72829 Elevated white blood cell count, unspecified: Secondary | ICD-10-CM | POA: Diagnosis not present

## 2022-12-25 DIAGNOSIS — E1169 Type 2 diabetes mellitus with other specified complication: Secondary | ICD-10-CM | POA: Diagnosis not present

## 2022-12-25 DIAGNOSIS — Z125 Encounter for screening for malignant neoplasm of prostate: Secondary | ICD-10-CM | POA: Diagnosis not present

## 2022-12-31 DIAGNOSIS — E785 Hyperlipidemia, unspecified: Secondary | ICD-10-CM | POA: Diagnosis not present

## 2022-12-31 DIAGNOSIS — Z96642 Presence of left artificial hip joint: Secondary | ICD-10-CM | POA: Diagnosis not present

## 2022-12-31 DIAGNOSIS — D692 Other nonthrombocytopenic purpura: Secondary | ICD-10-CM | POA: Diagnosis not present

## 2022-12-31 DIAGNOSIS — Z1331 Encounter for screening for depression: Secondary | ICD-10-CM | POA: Diagnosis not present

## 2022-12-31 DIAGNOSIS — R600 Localized edema: Secondary | ICD-10-CM | POA: Diagnosis not present

## 2022-12-31 DIAGNOSIS — R82998 Other abnormal findings in urine: Secondary | ICD-10-CM | POA: Diagnosis not present

## 2022-12-31 DIAGNOSIS — Z7901 Long term (current) use of anticoagulants: Secondary | ICD-10-CM | POA: Diagnosis not present

## 2022-12-31 DIAGNOSIS — Z86718 Personal history of other venous thrombosis and embolism: Secondary | ICD-10-CM | POA: Diagnosis not present

## 2022-12-31 DIAGNOSIS — I69322 Dysarthria following cerebral infarction: Secondary | ICD-10-CM | POA: Diagnosis not present

## 2022-12-31 DIAGNOSIS — M542 Cervicalgia: Secondary | ICD-10-CM | POA: Diagnosis not present

## 2022-12-31 DIAGNOSIS — E1169 Type 2 diabetes mellitus with other specified complication: Secondary | ICD-10-CM | POA: Diagnosis not present

## 2022-12-31 DIAGNOSIS — Z1339 Encounter for screening examination for other mental health and behavioral disorders: Secondary | ICD-10-CM | POA: Diagnosis not present

## 2022-12-31 DIAGNOSIS — I10 Essential (primary) hypertension: Secondary | ICD-10-CM | POA: Diagnosis not present

## 2022-12-31 DIAGNOSIS — Z8546 Personal history of malignant neoplasm of prostate: Secondary | ICD-10-CM | POA: Diagnosis not present

## 2022-12-31 DIAGNOSIS — Z86010 Personal history of colonic polyps: Secondary | ICD-10-CM | POA: Diagnosis not present

## 2022-12-31 DIAGNOSIS — Z0000 Encounter for general adult medical examination without abnormal findings: Secondary | ICD-10-CM | POA: Diagnosis not present

## 2022-12-31 DIAGNOSIS — D6869 Other thrombophilia: Secondary | ICD-10-CM | POA: Diagnosis not present

## 2022-12-31 DIAGNOSIS — G3184 Mild cognitive impairment, so stated: Secondary | ICD-10-CM | POA: Diagnosis not present

## 2023-02-04 DIAGNOSIS — Z86718 Personal history of other venous thrombosis and embolism: Secondary | ICD-10-CM | POA: Diagnosis not present

## 2023-02-04 DIAGNOSIS — E1169 Type 2 diabetes mellitus with other specified complication: Secondary | ICD-10-CM | POA: Diagnosis not present

## 2023-02-04 DIAGNOSIS — I480 Paroxysmal atrial fibrillation: Secondary | ICD-10-CM | POA: Diagnosis not present

## 2023-02-04 DIAGNOSIS — Z7901 Long term (current) use of anticoagulants: Secondary | ICD-10-CM | POA: Diagnosis not present

## 2023-02-04 DIAGNOSIS — D6869 Other thrombophilia: Secondary | ICD-10-CM | POA: Diagnosis not present

## 2023-02-18 DIAGNOSIS — L603 Nail dystrophy: Secondary | ICD-10-CM | POA: Diagnosis not present

## 2023-02-18 DIAGNOSIS — B351 Tinea unguium: Secondary | ICD-10-CM | POA: Diagnosis not present

## 2023-02-18 DIAGNOSIS — M79675 Pain in left toe(s): Secondary | ICD-10-CM | POA: Diagnosis not present

## 2023-02-18 DIAGNOSIS — M79674 Pain in right toe(s): Secondary | ICD-10-CM | POA: Diagnosis not present

## 2023-02-18 DIAGNOSIS — L84 Corns and callosities: Secondary | ICD-10-CM | POA: Diagnosis not present

## 2023-02-18 DIAGNOSIS — E1151 Type 2 diabetes mellitus with diabetic peripheral angiopathy without gangrene: Secondary | ICD-10-CM | POA: Diagnosis not present

## 2023-03-10 DIAGNOSIS — Z7901 Long term (current) use of anticoagulants: Secondary | ICD-10-CM | POA: Diagnosis not present

## 2023-03-10 DIAGNOSIS — D6869 Other thrombophilia: Secondary | ICD-10-CM | POA: Diagnosis not present

## 2023-03-10 DIAGNOSIS — I480 Paroxysmal atrial fibrillation: Secondary | ICD-10-CM | POA: Diagnosis not present

## 2023-03-10 DIAGNOSIS — Z86718 Personal history of other venous thrombosis and embolism: Secondary | ICD-10-CM | POA: Diagnosis not present

## 2023-04-07 DIAGNOSIS — I480 Paroxysmal atrial fibrillation: Secondary | ICD-10-CM | POA: Diagnosis not present

## 2023-04-07 DIAGNOSIS — E1169 Type 2 diabetes mellitus with other specified complication: Secondary | ICD-10-CM | POA: Diagnosis not present

## 2023-04-07 DIAGNOSIS — D6869 Other thrombophilia: Secondary | ICD-10-CM | POA: Diagnosis not present

## 2023-04-07 DIAGNOSIS — Z86718 Personal history of other venous thrombosis and embolism: Secondary | ICD-10-CM | POA: Diagnosis not present

## 2023-04-07 DIAGNOSIS — Z7901 Long term (current) use of anticoagulants: Secondary | ICD-10-CM | POA: Diagnosis not present

## 2023-04-09 ENCOUNTER — Ambulatory Visit: Payer: PPO | Admitting: Cardiology

## 2023-04-29 DIAGNOSIS — M79675 Pain in left toe(s): Secondary | ICD-10-CM | POA: Diagnosis not present

## 2023-04-29 DIAGNOSIS — L84 Corns and callosities: Secondary | ICD-10-CM | POA: Diagnosis not present

## 2023-04-29 DIAGNOSIS — M79674 Pain in right toe(s): Secondary | ICD-10-CM | POA: Diagnosis not present

## 2023-04-29 DIAGNOSIS — L603 Nail dystrophy: Secondary | ICD-10-CM | POA: Diagnosis not present

## 2023-04-29 DIAGNOSIS — E1151 Type 2 diabetes mellitus with diabetic peripheral angiopathy without gangrene: Secondary | ICD-10-CM | POA: Diagnosis not present

## 2023-04-29 DIAGNOSIS — B351 Tinea unguium: Secondary | ICD-10-CM | POA: Diagnosis not present

## 2023-05-12 DIAGNOSIS — D6869 Other thrombophilia: Secondary | ICD-10-CM | POA: Diagnosis not present

## 2023-05-12 DIAGNOSIS — Z7901 Long term (current) use of anticoagulants: Secondary | ICD-10-CM | POA: Diagnosis not present

## 2023-05-12 DIAGNOSIS — I480 Paroxysmal atrial fibrillation: Secondary | ICD-10-CM | POA: Diagnosis not present

## 2023-05-12 DIAGNOSIS — Z86718 Personal history of other venous thrombosis and embolism: Secondary | ICD-10-CM | POA: Diagnosis not present

## 2023-06-18 DIAGNOSIS — I480 Paroxysmal atrial fibrillation: Secondary | ICD-10-CM | POA: Diagnosis not present

## 2023-06-18 DIAGNOSIS — D6869 Other thrombophilia: Secondary | ICD-10-CM | POA: Diagnosis not present

## 2023-06-18 DIAGNOSIS — Z86718 Personal history of other venous thrombosis and embolism: Secondary | ICD-10-CM | POA: Diagnosis not present

## 2023-06-18 DIAGNOSIS — Z7901 Long term (current) use of anticoagulants: Secondary | ICD-10-CM | POA: Diagnosis not present

## 2023-07-08 DIAGNOSIS — D6869 Other thrombophilia: Secondary | ICD-10-CM | POA: Diagnosis not present

## 2023-07-08 DIAGNOSIS — I480 Paroxysmal atrial fibrillation: Secondary | ICD-10-CM | POA: Diagnosis not present

## 2023-07-08 DIAGNOSIS — D692 Other nonthrombocytopenic purpura: Secondary | ICD-10-CM | POA: Diagnosis not present

## 2023-07-08 DIAGNOSIS — Z8546 Personal history of malignant neoplasm of prostate: Secondary | ICD-10-CM | POA: Diagnosis not present

## 2023-07-08 DIAGNOSIS — Z6841 Body Mass Index (BMI) 40.0 and over, adult: Secondary | ICD-10-CM | POA: Diagnosis not present

## 2023-07-08 DIAGNOSIS — R600 Localized edema: Secondary | ICD-10-CM | POA: Diagnosis not present

## 2023-07-08 DIAGNOSIS — E1169 Type 2 diabetes mellitus with other specified complication: Secondary | ICD-10-CM | POA: Diagnosis not present

## 2023-07-08 DIAGNOSIS — I77811 Abdominal aortic ectasia: Secondary | ICD-10-CM | POA: Diagnosis not present

## 2023-07-08 DIAGNOSIS — M542 Cervicalgia: Secondary | ICD-10-CM | POA: Diagnosis not present

## 2023-07-08 DIAGNOSIS — I10 Essential (primary) hypertension: Secondary | ICD-10-CM | POA: Diagnosis not present

## 2023-07-08 DIAGNOSIS — Z86718 Personal history of other venous thrombosis and embolism: Secondary | ICD-10-CM | POA: Diagnosis not present

## 2023-07-22 DIAGNOSIS — Z86718 Personal history of other venous thrombosis and embolism: Secondary | ICD-10-CM | POA: Diagnosis not present

## 2023-07-22 DIAGNOSIS — I480 Paroxysmal atrial fibrillation: Secondary | ICD-10-CM | POA: Diagnosis not present

## 2023-07-22 DIAGNOSIS — Z7901 Long term (current) use of anticoagulants: Secondary | ICD-10-CM | POA: Diagnosis not present

## 2023-07-22 DIAGNOSIS — D6869 Other thrombophilia: Secondary | ICD-10-CM | POA: Diagnosis not present

## 2023-07-23 DIAGNOSIS — L603 Nail dystrophy: Secondary | ICD-10-CM | POA: Diagnosis not present

## 2023-07-23 DIAGNOSIS — L84 Corns and callosities: Secondary | ICD-10-CM | POA: Diagnosis not present

## 2023-07-23 DIAGNOSIS — M79675 Pain in left toe(s): Secondary | ICD-10-CM | POA: Diagnosis not present

## 2023-07-23 DIAGNOSIS — M79674 Pain in right toe(s): Secondary | ICD-10-CM | POA: Diagnosis not present

## 2023-07-23 DIAGNOSIS — E1151 Type 2 diabetes mellitus with diabetic peripheral angiopathy without gangrene: Secondary | ICD-10-CM | POA: Diagnosis not present

## 2023-07-23 DIAGNOSIS — B351 Tinea unguium: Secondary | ICD-10-CM | POA: Diagnosis not present

## 2023-08-18 DIAGNOSIS — D6869 Other thrombophilia: Secondary | ICD-10-CM | POA: Diagnosis not present

## 2023-08-18 DIAGNOSIS — Z7901 Long term (current) use of anticoagulants: Secondary | ICD-10-CM | POA: Diagnosis not present

## 2023-08-18 DIAGNOSIS — I480 Paroxysmal atrial fibrillation: Secondary | ICD-10-CM | POA: Diagnosis not present

## 2023-09-09 DIAGNOSIS — D6869 Other thrombophilia: Secondary | ICD-10-CM | POA: Diagnosis not present

## 2023-09-09 DIAGNOSIS — Z7901 Long term (current) use of anticoagulants: Secondary | ICD-10-CM | POA: Diagnosis not present

## 2023-09-09 DIAGNOSIS — I480 Paroxysmal atrial fibrillation: Secondary | ICD-10-CM | POA: Diagnosis not present

## 2023-09-14 DIAGNOSIS — H04123 Dry eye syndrome of bilateral lacrimal glands: Secondary | ICD-10-CM | POA: Diagnosis not present

## 2023-09-14 DIAGNOSIS — E119 Type 2 diabetes mellitus without complications: Secondary | ICD-10-CM | POA: Diagnosis not present

## 2023-09-14 DIAGNOSIS — H2513 Age-related nuclear cataract, bilateral: Secondary | ICD-10-CM | POA: Diagnosis not present

## 2023-09-25 DIAGNOSIS — C61 Malignant neoplasm of prostate: Secondary | ICD-10-CM | POA: Diagnosis not present

## 2023-09-29 DIAGNOSIS — L84 Corns and callosities: Secondary | ICD-10-CM | POA: Diagnosis not present

## 2023-09-29 DIAGNOSIS — B351 Tinea unguium: Secondary | ICD-10-CM | POA: Diagnosis not present

## 2023-09-29 DIAGNOSIS — L603 Nail dystrophy: Secondary | ICD-10-CM | POA: Diagnosis not present

## 2023-09-29 DIAGNOSIS — M79675 Pain in left toe(s): Secondary | ICD-10-CM | POA: Diagnosis not present

## 2023-09-29 DIAGNOSIS — M79674 Pain in right toe(s): Secondary | ICD-10-CM | POA: Diagnosis not present

## 2023-09-29 DIAGNOSIS — E1151 Type 2 diabetes mellitus with diabetic peripheral angiopathy without gangrene: Secondary | ICD-10-CM | POA: Diagnosis not present

## 2023-10-01 DIAGNOSIS — C61 Malignant neoplasm of prostate: Secondary | ICD-10-CM | POA: Diagnosis not present

## 2023-10-01 DIAGNOSIS — R3915 Urgency of urination: Secondary | ICD-10-CM | POA: Diagnosis not present

## 2023-10-13 DIAGNOSIS — D6869 Other thrombophilia: Secondary | ICD-10-CM | POA: Diagnosis not present

## 2023-10-13 DIAGNOSIS — Z86718 Personal history of other venous thrombosis and embolism: Secondary | ICD-10-CM | POA: Diagnosis not present

## 2023-10-13 DIAGNOSIS — I480 Paroxysmal atrial fibrillation: Secondary | ICD-10-CM | POA: Diagnosis not present

## 2023-10-13 DIAGNOSIS — Z7901 Long term (current) use of anticoagulants: Secondary | ICD-10-CM | POA: Diagnosis not present

## 2023-11-18 DIAGNOSIS — Z7901 Long term (current) use of anticoagulants: Secondary | ICD-10-CM | POA: Diagnosis not present

## 2023-11-18 DIAGNOSIS — I480 Paroxysmal atrial fibrillation: Secondary | ICD-10-CM | POA: Diagnosis not present

## 2023-11-18 DIAGNOSIS — D6869 Other thrombophilia: Secondary | ICD-10-CM | POA: Diagnosis not present

## 2023-12-10 DIAGNOSIS — B351 Tinea unguium: Secondary | ICD-10-CM | POA: Diagnosis not present

## 2023-12-10 DIAGNOSIS — M79675 Pain in left toe(s): Secondary | ICD-10-CM | POA: Diagnosis not present

## 2023-12-10 DIAGNOSIS — E1151 Type 2 diabetes mellitus with diabetic peripheral angiopathy without gangrene: Secondary | ICD-10-CM | POA: Diagnosis not present

## 2023-12-10 DIAGNOSIS — M79674 Pain in right toe(s): Secondary | ICD-10-CM | POA: Diagnosis not present

## 2023-12-10 DIAGNOSIS — L84 Corns and callosities: Secondary | ICD-10-CM | POA: Diagnosis not present

## 2023-12-10 DIAGNOSIS — L603 Nail dystrophy: Secondary | ICD-10-CM | POA: Diagnosis not present

## 2023-12-16 DIAGNOSIS — I480 Paroxysmal atrial fibrillation: Secondary | ICD-10-CM | POA: Diagnosis not present

## 2023-12-16 DIAGNOSIS — D6869 Other thrombophilia: Secondary | ICD-10-CM | POA: Diagnosis not present

## 2023-12-16 DIAGNOSIS — Z7901 Long term (current) use of anticoagulants: Secondary | ICD-10-CM | POA: Diagnosis not present

## 2024-01-07 DIAGNOSIS — Z8546 Personal history of malignant neoplasm of prostate: Secondary | ICD-10-CM | POA: Diagnosis not present

## 2024-01-07 DIAGNOSIS — E1169 Type 2 diabetes mellitus with other specified complication: Secondary | ICD-10-CM | POA: Diagnosis not present

## 2024-01-07 DIAGNOSIS — I10 Essential (primary) hypertension: Secondary | ICD-10-CM | POA: Diagnosis not present

## 2024-01-07 DIAGNOSIS — E785 Hyperlipidemia, unspecified: Secondary | ICD-10-CM | POA: Diagnosis not present

## 2024-01-14 DIAGNOSIS — R600 Localized edema: Secondary | ICD-10-CM | POA: Diagnosis not present

## 2024-01-14 DIAGNOSIS — Z86718 Personal history of other venous thrombosis and embolism: Secondary | ICD-10-CM | POA: Diagnosis not present

## 2024-01-14 DIAGNOSIS — E785 Hyperlipidemia, unspecified: Secondary | ICD-10-CM | POA: Diagnosis not present

## 2024-01-14 DIAGNOSIS — F17201 Nicotine dependence, unspecified, in remission: Secondary | ICD-10-CM | POA: Diagnosis not present

## 2024-01-14 DIAGNOSIS — I480 Paroxysmal atrial fibrillation: Secondary | ICD-10-CM | POA: Diagnosis not present

## 2024-01-14 DIAGNOSIS — Z1331 Encounter for screening for depression: Secondary | ICD-10-CM | POA: Diagnosis not present

## 2024-01-14 DIAGNOSIS — G3184 Mild cognitive impairment, so stated: Secondary | ICD-10-CM | POA: Diagnosis not present

## 2024-01-14 DIAGNOSIS — I10 Essential (primary) hypertension: Secondary | ICD-10-CM | POA: Diagnosis not present

## 2024-01-14 DIAGNOSIS — Z860101 Personal history of adenomatous and serrated colon polyps: Secondary | ICD-10-CM | POA: Diagnosis not present

## 2024-01-14 DIAGNOSIS — Z8546 Personal history of malignant neoplasm of prostate: Secondary | ICD-10-CM | POA: Diagnosis not present

## 2024-01-14 DIAGNOSIS — M542 Cervicalgia: Secondary | ICD-10-CM | POA: Diagnosis not present

## 2024-01-14 DIAGNOSIS — D692 Other nonthrombocytopenic purpura: Secondary | ICD-10-CM | POA: Diagnosis not present

## 2024-01-14 DIAGNOSIS — Z0000 Encounter for general adult medical examination without abnormal findings: Secondary | ICD-10-CM | POA: Diagnosis not present

## 2024-01-14 DIAGNOSIS — E1169 Type 2 diabetes mellitus with other specified complication: Secondary | ICD-10-CM | POA: Diagnosis not present

## 2024-01-14 DIAGNOSIS — Z1339 Encounter for screening examination for other mental health and behavioral disorders: Secondary | ICD-10-CM | POA: Diagnosis not present

## 2024-01-14 DIAGNOSIS — R82998 Other abnormal findings in urine: Secondary | ICD-10-CM | POA: Diagnosis not present

## 2024-01-14 DIAGNOSIS — I69322 Dysarthria following cerebral infarction: Secondary | ICD-10-CM | POA: Diagnosis not present

## 2024-01-14 DIAGNOSIS — D6869 Other thrombophilia: Secondary | ICD-10-CM | POA: Diagnosis not present

## 2024-02-10 DIAGNOSIS — D6869 Other thrombophilia: Secondary | ICD-10-CM | POA: Diagnosis not present

## 2024-02-10 DIAGNOSIS — I480 Paroxysmal atrial fibrillation: Secondary | ICD-10-CM | POA: Diagnosis not present

## 2024-02-10 DIAGNOSIS — Z7901 Long term (current) use of anticoagulants: Secondary | ICD-10-CM | POA: Diagnosis not present

## 2024-02-11 DIAGNOSIS — M25512 Pain in left shoulder: Secondary | ICD-10-CM | POA: Diagnosis not present

## 2024-02-11 DIAGNOSIS — M542 Cervicalgia: Secondary | ICD-10-CM | POA: Diagnosis not present

## 2024-02-16 DIAGNOSIS — M25512 Pain in left shoulder: Secondary | ICD-10-CM | POA: Diagnosis not present

## 2024-02-16 DIAGNOSIS — M542 Cervicalgia: Secondary | ICD-10-CM | POA: Diagnosis not present

## 2024-02-22 DIAGNOSIS — M25512 Pain in left shoulder: Secondary | ICD-10-CM | POA: Diagnosis not present

## 2024-02-22 DIAGNOSIS — M542 Cervicalgia: Secondary | ICD-10-CM | POA: Diagnosis not present

## 2024-02-29 DIAGNOSIS — I480 Paroxysmal atrial fibrillation: Secondary | ICD-10-CM | POA: Diagnosis not present

## 2024-02-29 DIAGNOSIS — R21 Rash and other nonspecific skin eruption: Secondary | ICD-10-CM | POA: Diagnosis not present

## 2024-02-29 DIAGNOSIS — M549 Dorsalgia, unspecified: Secondary | ICD-10-CM | POA: Diagnosis not present

## 2024-02-29 DIAGNOSIS — I69322 Dysarthria following cerebral infarction: Secondary | ICD-10-CM | POA: Diagnosis not present

## 2024-02-29 DIAGNOSIS — S29012A Strain of muscle and tendon of back wall of thorax, initial encounter: Secondary | ICD-10-CM | POA: Diagnosis not present

## 2024-02-29 DIAGNOSIS — B029 Zoster without complications: Secondary | ICD-10-CM | POA: Diagnosis not present

## 2024-03-03 DIAGNOSIS — M542 Cervicalgia: Secondary | ICD-10-CM | POA: Diagnosis not present

## 2024-03-03 DIAGNOSIS — M25512 Pain in left shoulder: Secondary | ICD-10-CM | POA: Diagnosis not present

## 2024-03-09 DIAGNOSIS — I480 Paroxysmal atrial fibrillation: Secondary | ICD-10-CM | POA: Diagnosis not present

## 2024-03-09 DIAGNOSIS — Z86718 Personal history of other venous thrombosis and embolism: Secondary | ICD-10-CM | POA: Diagnosis not present

## 2024-03-09 DIAGNOSIS — D6869 Other thrombophilia: Secondary | ICD-10-CM | POA: Diagnosis not present

## 2024-03-09 DIAGNOSIS — Z7901 Long term (current) use of anticoagulants: Secondary | ICD-10-CM | POA: Diagnosis not present

## 2024-03-10 DIAGNOSIS — M25512 Pain in left shoulder: Secondary | ICD-10-CM | POA: Diagnosis not present

## 2024-03-10 DIAGNOSIS — M542 Cervicalgia: Secondary | ICD-10-CM | POA: Diagnosis not present

## 2024-03-17 DIAGNOSIS — L603 Nail dystrophy: Secondary | ICD-10-CM | POA: Diagnosis not present

## 2024-03-17 DIAGNOSIS — M25512 Pain in left shoulder: Secondary | ICD-10-CM | POA: Diagnosis not present

## 2024-03-17 DIAGNOSIS — L84 Corns and callosities: Secondary | ICD-10-CM | POA: Diagnosis not present

## 2024-03-17 DIAGNOSIS — M542 Cervicalgia: Secondary | ICD-10-CM | POA: Diagnosis not present

## 2024-03-17 DIAGNOSIS — E1151 Type 2 diabetes mellitus with diabetic peripheral angiopathy without gangrene: Secondary | ICD-10-CM | POA: Diagnosis not present

## 2024-03-17 DIAGNOSIS — M79675 Pain in left toe(s): Secondary | ICD-10-CM | POA: Diagnosis not present

## 2024-03-17 DIAGNOSIS — B351 Tinea unguium: Secondary | ICD-10-CM | POA: Diagnosis not present

## 2024-03-17 DIAGNOSIS — M79674 Pain in right toe(s): Secondary | ICD-10-CM | POA: Diagnosis not present

## 2024-03-24 DIAGNOSIS — M25512 Pain in left shoulder: Secondary | ICD-10-CM | POA: Diagnosis not present

## 2024-03-24 DIAGNOSIS — M542 Cervicalgia: Secondary | ICD-10-CM | POA: Diagnosis not present

## 2024-04-13 DIAGNOSIS — L03116 Cellulitis of left lower limb: Secondary | ICD-10-CM | POA: Diagnosis not present

## 2024-04-13 DIAGNOSIS — I480 Paroxysmal atrial fibrillation: Secondary | ICD-10-CM | POA: Diagnosis not present

## 2024-04-13 DIAGNOSIS — D6869 Other thrombophilia: Secondary | ICD-10-CM | POA: Diagnosis not present

## 2024-04-13 DIAGNOSIS — Z7901 Long term (current) use of anticoagulants: Secondary | ICD-10-CM | POA: Diagnosis not present

## 2024-05-12 DIAGNOSIS — I480 Paroxysmal atrial fibrillation: Secondary | ICD-10-CM | POA: Diagnosis not present

## 2024-05-12 DIAGNOSIS — D6869 Other thrombophilia: Secondary | ICD-10-CM | POA: Diagnosis not present

## 2024-05-12 DIAGNOSIS — Z7901 Long term (current) use of anticoagulants: Secondary | ICD-10-CM | POA: Diagnosis not present

## 2024-06-14 DIAGNOSIS — I480 Paroxysmal atrial fibrillation: Secondary | ICD-10-CM | POA: Diagnosis not present

## 2024-06-14 DIAGNOSIS — D6869 Other thrombophilia: Secondary | ICD-10-CM | POA: Diagnosis not present

## 2024-06-14 DIAGNOSIS — Z7901 Long term (current) use of anticoagulants: Secondary | ICD-10-CM | POA: Diagnosis not present

## 2024-06-15 DIAGNOSIS — L84 Corns and callosities: Secondary | ICD-10-CM | POA: Diagnosis not present

## 2024-06-15 DIAGNOSIS — B351 Tinea unguium: Secondary | ICD-10-CM | POA: Diagnosis not present

## 2024-06-15 DIAGNOSIS — M79675 Pain in left toe(s): Secondary | ICD-10-CM | POA: Diagnosis not present

## 2024-06-15 DIAGNOSIS — M79674 Pain in right toe(s): Secondary | ICD-10-CM | POA: Diagnosis not present

## 2024-06-15 DIAGNOSIS — E1151 Type 2 diabetes mellitus with diabetic peripheral angiopathy without gangrene: Secondary | ICD-10-CM | POA: Diagnosis not present

## 2024-06-15 DIAGNOSIS — L603 Nail dystrophy: Secondary | ICD-10-CM | POA: Diagnosis not present

## 2024-07-19 DIAGNOSIS — I480 Paroxysmal atrial fibrillation: Secondary | ICD-10-CM | POA: Diagnosis not present

## 2024-07-19 DIAGNOSIS — Z7901 Long term (current) use of anticoagulants: Secondary | ICD-10-CM | POA: Diagnosis not present

## 2024-07-19 DIAGNOSIS — D6869 Other thrombophilia: Secondary | ICD-10-CM | POA: Diagnosis not present

## 2024-08-16 DIAGNOSIS — Z7901 Long term (current) use of anticoagulants: Secondary | ICD-10-CM | POA: Diagnosis not present

## 2024-08-16 DIAGNOSIS — D6869 Other thrombophilia: Secondary | ICD-10-CM | POA: Diagnosis not present

## 2024-08-16 DIAGNOSIS — I480 Paroxysmal atrial fibrillation: Secondary | ICD-10-CM | POA: Diagnosis not present
